# Patient Record
Sex: Female | Born: 2000 | Hispanic: Yes | Marital: Single | Smoking: Former smoker
Health system: Southern US, Community
[De-identification: ages and names within clinical notes are randomized; demographics above are authoritative.]

## PROBLEM LIST (undated history)

## (undated) ENCOUNTER — Emergency Department (HOSPITAL_COMMUNITY): Discharge status: Absent without leave | Payer: Medicaid Other

## (undated) ENCOUNTER — Inpatient Hospital Stay (HOSPITAL_COMMUNITY): Payer: Self-pay

## (undated) DIAGNOSIS — Z915 Personal history of self-harm: Secondary | ICD-10-CM

## (undated) DIAGNOSIS — Z9151 Personal history of suicidal behavior: Secondary | ICD-10-CM

## (undated) DIAGNOSIS — R51 Headache: Secondary | ICD-10-CM

## (undated) DIAGNOSIS — F419 Anxiety disorder, unspecified: Secondary | ICD-10-CM

## (undated) DIAGNOSIS — F329 Major depressive disorder, single episode, unspecified: Secondary | ICD-10-CM

## (undated) DIAGNOSIS — F4310 Post-traumatic stress disorder, unspecified: Secondary | ICD-10-CM

## (undated) DIAGNOSIS — J45909 Unspecified asthma, uncomplicated: Secondary | ICD-10-CM

## (undated) HISTORY — PX: NO PAST SURGERIES: SHX2092

## (undated) HISTORY — PX: OTHER SURGICAL HISTORY: SHX169

---
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
## 2018-01-07 ENCOUNTER — Other Ambulatory Visit: Payer: Self-pay

## 2018-01-07 ENCOUNTER — Encounter (HOSPITAL_COMMUNITY): Payer: Self-pay | Admitting: Emergency Medicine

## 2018-01-07 ENCOUNTER — Emergency Department (HOSPITAL_COMMUNITY)
Admission: EM | Admit: 2018-01-07 | Discharge: 2018-01-07 | Disposition: A | Discharge status: Home / Self Care | Payer: Medicaid Other | Attending: Emergency Medicine | Admitting: Emergency Medicine

## 2018-01-07 DIAGNOSIS — I10 Essential (primary) hypertension: Secondary | ICD-10-CM | POA: Diagnosis not present

## 2018-01-07 DIAGNOSIS — F17210 Nicotine dependence, cigarettes, uncomplicated: Secondary | ICD-10-CM | POA: Insufficient documentation

## 2018-01-07 DIAGNOSIS — Z79899 Other long term (current) drug therapy: Secondary | ICD-10-CM | POA: Diagnosis not present

## 2018-01-07 DIAGNOSIS — M545 Low back pain: Secondary | ICD-10-CM | POA: Diagnosis present

## 2018-01-07 DIAGNOSIS — N3000 Acute cystitis without hematuria: Secondary | ICD-10-CM | POA: Insufficient documentation

## 2018-01-07 LAB — URINALYSIS, ROUTINE W REFLEX MICROSCOPIC
Bacteria, UA: NONE SEEN
Bilirubin Urine: NEGATIVE
Glucose, UA: NEGATIVE mg/dL
Ketones, ur: 5 mg/dL — AB
Nitrite: NEGATIVE
PH: 6 (ref 5.0–8.0)
Protein, ur: NEGATIVE mg/dL
SPECIFIC GRAVITY, URINE: 1.027 (ref 1.005–1.030)

## 2018-01-07 LAB — PREGNANCY, URINE: Preg Test, Ur: NEGATIVE

## 2018-01-07 MED ORDER — CEPHALEXIN 500 MG PO CAPS: 500.0000 mg | ORAL_CAPSULE | Freq: Two times a day (BID) | ORAL | 0 refills | Status: AC

## 2018-01-07 MED ORDER — CEPHALEXIN 500 MG PO CAPS
500.0000 mg | ORAL_CAPSULE | Freq: Once | ORAL | Status: AC
  Administered 2018-01-07: 500 mg via ORAL
  Filled 2018-01-07: qty 1

## 2018-01-07 NOTE — ED Triage Notes (Signed)
Pt c/o lower back / flank pain. Pt states pain has worsened over the past several days.  Pt denies injury or strenuous work. States it feels like when she had a UTI.

## 2018-01-07 NOTE — ED Provider Notes (Signed)
[facility]-EMERGENCY DEPT Provider Note   CSN: [id number] Arrival date & time: 01/07/18  1921     History   Chief Complaint Chief Complaint  Patient presents with  . Flank Pain    HPI Erika Christell ConstantMoore is a 17 y.o. female.  HPI Pt has had some lower back aching for the last few weeks off and on.  Certain movements and positions increase the pain.  Curling into a ball helps. The sx have not resolved so she decided to come in and get checked.  Pt has a history of uti before that developed into a kidney infection.  She wants to make sure that is not going on.  No fevers.  No nausea or vomiting.  No abdominal pain.  Pt has HTN.  She has been taking her meds.   Past Medical History:  Diagnosis Date  . Anxiety   . Headache(784.0)   . Hx of suicide attempt   . Major depressive disorder   . PTSD (post-traumatic stress disorder)     Patient Active Problem List   Diagnosis Date Noted  . MDD (major depressive disorder), recurrent episode, severe (HCC) 10/10/2017  . Bipolar disorder (HCC) 08/23/2017  . Substance use disorder 06/08/2017  . Elevated blood pressure reading 04/13/2017  . Encounter for initial prescription of injectable contraceptive 03/09/2017  . 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 Hx of suicide attempt 03/19/2016  . PTSD (post-traumatic stress disorder) 03/19/2016  . Generalized anxiety disorder   . 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 Suicidal ideation     History reviewed. No pertinent surgical history.   OB History    Gravida  1   Para  1   Term  1   Preterm  0   AB  0   Living  1     SAB  0   TAB  0   Ectopic  0   Multiple  0   Live Births  1            Home Medications    Prior to Admission medications   Medication Sig Start Date End Date Taking? Authorizing Provider  albuterol (PROVENTIL HFA;VENTOLIN HFA) 108 (90 Base) MCG/ACT inhaler Inhale 1 puff  into the lungs every 6 (six) hours as needed for wheezing or shortness of breath. 10/13/17  Yes Armandina StammerNwoko, Agnes I, NP  escitalopram (LEXAPRO) 10 MG tablet Take 1 tablet (10 mg total) by mouth daily. For depression 10/14/17  Yes Nwoko, Nicole KindredAgnes I, NP  ibuprofen (ADVIL,MOTRIN) 200 MG tablet Take 800 mg by mouth every 6 (six) hours as needed for moderate pain.   Yes [provider]  medroxyPROGESTERone (DEPO-PROVERA) 150 MG/ML injection Inject 150 mg into the muscle every 3 (three) months.   Yes [provider]  prazosin (MINIPRESS) 1 MG capsule Take 1 capsule (1 mg total) by mouth at bedtime. For PTSD symptoms 10/13/17  Yes Armandina StammerNwoko, Agnes I, NP  traZODone (DESYREL) 50 MG tablet Take 1 tablet (50 mg total) by mouth at bedtime as needed for sleep. 10/13/17  Yes Armandina StammerNwoko, Agnes I, NP  cephALEXin (KEFLEX) 500 MG capsule Take 1 capsule (500 mg total) by mouth 2 (two) times daily for 5 days. 01/07/18 01/12/18  Linwood DibblesKnapp, Gwendoline Judy, MD  fluconazole (DIFLUCAN) 150 MG tablet Take 1 tablet today and 1 tablet 3 days from now Patient not taking: Reported on 01/07/2018 11/02/17   Verneda SkillHacker, Caroline T, FNP  zolpidem (AMBIEN) 5 MG  tablet Take 5 mg by mouth at bedtime as needed for sleep.  10/19/17   [provider]    Family History Family History  Problem Relation Age of Onset  . Diabetes Maternal Aunt   . Diabetes Maternal Grandmother   . Cancer Maternal Grandmother   . Breast cancer Other     Social History Social History   Tobacco Use  . Smoking status: Current Every Day Smoker    Packs/day: 0.25    Types: Cigars  . Smokeless tobacco: Never Used  . Tobacco comment: black and mild  Substance Use Topics  . Alcohol use: No  . Drug use: Yes    Types: Marijuana, MDMA (Ecstacy)     Allergies   Lactose intolerance (gi) and Tape   Review of Systems Review of Systems  All other systems reviewed and are negative.    Physical Exam Updated Vital Signs BP (!) 174/100 (BP Location: Left Arm)    Pulse (!) 104   Temp 98.7 F (37.1 C) (Oral)   Resp 18   LMP 12/15/2017 (Exact Date)   SpO2 100%   Physical Exam Vitals signs and nursing note reviewed.  Constitutional:      General: She is not in acute distress.    Appearance: She is well-developed.  HENT:     Head: Normocephalic and atraumatic.     Right Ear: External ear normal.     Left Ear: External ear normal.  Eyes:     General: No scleral icterus.       Right eye: No discharge.        Left eye: No discharge.     Conjunctiva/sclera: Conjunctivae normal.  Neck:     Musculoskeletal: Neck supple.     Trachea: No tracheal deviation.  Cardiovascular:     Rate and Rhythm: Normal rate and regular rhythm.  Pulmonary:     Effort: Pulmonary effort is normal. No respiratory distress.     Breath sounds: Normal breath sounds. No stridor. No wheezing or rales.  Abdominal:     General: Bowel sounds are normal. There is no distension.     Palpations: Abdomen is soft.     Tenderness: There is no abdominal tenderness. There is no guarding or rebound.  Musculoskeletal:        General: No tenderness.  Skin:    General: Skin is warm and dry.     Findings: No rash.  Neurological:     Mental Status: She is alert.     Cranial Nerves: No cranial nerve deficit (no facial droop, extraocular movements intact, no slurred speech).     Sensory: No sensory deficit.     Motor: No abnormal muscle tone or seizure activity.     Coordination: Coordination normal.      ED Treatments / Results  Labs (all labs ordered are listed, but only abnormal results are displayed) Labs Reviewed  URINALYSIS, ROUTINE W REFLEX MICROSCOPIC - Abnormal; Notable for the following components:      Result Value   APPearance HAZY (*)    Hgb urine dipstick MODERATE (*)    Ketones, ur 5 (*)    Leukocytes, UA MODERATE (*)    All other components within normal limits  PREGNANCY, URINE     Procedures Procedures (including critical care time)  Medications  Ordered in ED Medications  cephALEXin (KEFLEX) capsule 500 mg (has no administration in time range)     Initial Impression / Assessment and Plan / ED Course  I  have reviewed the triage vital signs and the nursing notes.  Pertinent labs & imaging results that were available during my care of the patient were reviewed by me and considered in my medical decision making (see chart for details).   UA is consistent with uti.  Patient does not have any findings to suggest pyelonephritis.  Pt is also having asymptomatic hypertension.  I recommended she follow-up with her primary care doctor.  Final Clinical Impressions(s) / ED Diagnoses   Final diagnoses:  Acute cystitis without hematuria  Hypertension, unspecified type    ED Discharge Orders         Ordered    cephALEXin (KEFLEX) 500 MG capsule  2 times daily     01/07/18 2104           Linwood Dibbles, MD 01/07/18 2113

## 2018-01-07 NOTE — Discharge Instructions (Addendum)
Take the antibiotics as prescribed, follow-up with your primary care doctor in the next week or 2 to make sure the infection has not cleared.  double check on your blood pressure . return for fever , vomiting, or other concerning symptoms

## 2018-01-30 ENCOUNTER — Ambulatory Visit (INDEPENDENT_AMBULATORY_CARE_PROVIDER_SITE_OTHER): Payer: Medicaid Other | Admitting: Pediatrics

## 2018-01-30 ENCOUNTER — Encounter: Payer: Self-pay | Admitting: Pediatrics

## 2018-01-30 VITALS — BP 124/79 | HR 81 | Ht 68.0 in | Wt 247.0 lb

## 2018-01-30 DIAGNOSIS — Z113 Encounter for screening for infections with a predominantly sexual mode of transmission: Secondary | ICD-10-CM

## 2018-01-30 DIAGNOSIS — Z3202 Encounter for pregnancy test, result negative: Secondary | ICD-10-CM

## 2018-01-30 DIAGNOSIS — Z30016 Encounter for initial prescription of transdermal patch hormonal contraceptive device: Secondary | ICD-10-CM

## 2018-01-30 DIAGNOSIS — F411 Generalized anxiety disorder: Secondary | ICD-10-CM | POA: Diagnosis not present

## 2018-01-30 DIAGNOSIS — F333 Major depressive disorder, recurrent, severe with psychotic symptoms: Secondary | ICD-10-CM

## 2018-01-30 DIAGNOSIS — B009 Herpesviral infection, unspecified: Secondary | ICD-10-CM

## 2018-01-30 DIAGNOSIS — F1990 Other psychoactive substance use, unspecified, uncomplicated: Secondary | ICD-10-CM

## 2018-01-30 DIAGNOSIS — F3175 Bipolar disorder, in partial remission, most recent episode depressed: Secondary | ICD-10-CM

## 2018-01-30 LAB — POCT URINE PREGNANCY: Preg Test, Ur: NEGATIVE

## 2018-01-30 MED ORDER — NORELGESTROMIN-ETH ESTRADIOL 150-35 MCG/24HR TD PTWK: 1.0000 | MEDICATED_PATCH | TRANSDERMAL | 12 refills | Status: DC

## 2018-01-30 MED ORDER — VALACYCLOVIR HCL 500 MG PO TABS: 500.0000 mg | ORAL_TABLET | Freq: Two times a day (BID) | ORAL | 3 refills | Status: DC

## 2018-01-30 NOTE — Patient Instructions (Addendum)
Use patch according to printed instructions  Continue to monitor your memory related to your mood and stress level. Continue with psychiatry.  Use valtrex as needed for outbreaks.  We will see you in 6 weeks

## 2018-01-30 NOTE — Progress Notes (Signed)
History was provided by the patient.  [name] is a 18 y.o. female who is here for contraception, MDD, anxiety, STI screening.  [name], FNP   HPI:  Pt reports that she went to the ER with a kidney infection recently and wants to make sure it is gone. She is also concerned that she has BV and yeast again today too. Also requesting an STI check today.   Having memory and concentration issues. She is forgetting words that she knows, routines, times to go to work, Catering manager. She reports it has been about 6-8 weeks that it has been happening. She has been off her medications since September. Her medicaid got discontinued in October but is reinstated.    Has been going to therapy regularly. Had a med appointment last week and will go again next week to restart others. She has not spoken to psychiatrist about concerns.   She is smoking cigarettes and marijuana but trying to quit cigarettes. Denies other substances.   Bled for 5 weeks which started after thanksgiving and just stopped a few days ago. It is still not totally gone. She reports she hasn't really been having sex bc she hasn't really been "feeling anything" or been feeling sad. Interested in patch. Denies migraine with aura. Denies hx of blood clots.   Reports her psychiatrist wants [place] to draw some labs for her but she can't remember which ones- advised to call back when she knows and will place orders and schedule appt time with lab.   Patient's last menstrual period was 01/25/2018.  Review of Systems  Constitutional: Negative for malaise/fatigue.  Eyes: Negative for double vision.  Respiratory: Negative for shortness of breath.   Cardiovascular: Negative for chest pain and palpitations.  Gastrointestinal: Negative for abdominal pain, constipation, diarrhea, nausea and vomiting.  Genitourinary: Negative for dysuria.  Musculoskeletal: Negative for joint pain and myalgias.  Skin: Negative for rash.  Neurological: Positive for  headaches. Negative for dizziness.  Endo/Heme/Allergies: Does not bruise/bleed easily.  Psychiatric/Behavioral: Positive for depression. The patient is nervous/anxious.     Patient Active Problem List   Diagnosis Date Noted  . MDD (major depressive disorder), recurrent episode, severe (HCC) 10/10/2017  . Bipolar disorder (HCC) 08/23/2017  . Substance use disorder 06/08/2017  . Elevated blood pressure reading 04/13/2017  . 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 Hx of suicide attempt 03/19/2016  . PTSD (post-traumatic stress disorder) 03/19/2016  . Generalized anxiety disorder   . 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 Suicidal ideation     Current Outpatient Medications on File Prior to Visit  Medication Sig Dispense Refill  . albuterol (PROVENTIL HFA;VENTOLIN HFA) 108 (90 Base) MCG/ACT inhaler Inhale 1 puff into the lungs every 6 (six) hours as needed for wheezing or shortness of breath.    . cariprazine (VRAYLAR) capsule Take 1.5 mg by mouth.    Marland Kitchen ibuprofen (ADVIL,MOTRIN) 200 MG tablet Take 800 mg by mouth every 6 (six) hours as needed for moderate pain.    . medroxyPROGESTERone (DEPO-PROVERA) 150 MG/ML injection Inject 150 mg into the muscle every 3 (three) months.    . zolpidem (AMBIEN) 5 MG tablet Take 5 mg by mouth at bedtime as needed for sleep.   0   No current facility-administered medications on file prior to visit.     Allergies  Allergen Reactions  . Lactose Intolerance (Gi) Nausea And Vomiting and Other (See Comments)  . Tape Other (See Comments)  Slight skin irritation     Physical Exam:    Vitals:   01/30/18 1024  BP: 124/79  Pulse: 81  Weight: 247 lb (112 kg)  Height: 5\' 8"  (1.727 m)    Blood pressure percentiles are not available for patients who are 18 years or older.  Physical Exam Vitals signs and nursing note reviewed.  Constitutional:      General: She is not in acute distress.    Appearance: She is  well-developed.  Neck:     Thyroid: No thyromegaly.  Cardiovascular:     Rate and Rhythm: Normal rate and regular rhythm.     Heart sounds: No murmur.  Pulmonary:     Breath sounds: Normal breath sounds.  Abdominal:     Palpations: Abdomen is soft. There is no mass.     Tenderness: There is no abdominal tenderness. There is no guarding.  Lymphadenopathy:     Cervical: No cervical adenopathy.  Skin:    General: Skin is warm.     Findings: No rash.  Neurological:     General: No focal deficit present.     Mental Status: She is alert and oriented to person, place, and time.  Psychiatric:        Mood and Affect: Mood normal.     Assessment/Plan: 1. MDD (major depressive disorder), recurrent, severe, with psychosis (HCC) I suspect that her mental health conditions and new stress in her household are causing the issues with her concentration and remembering things. Asked her to discuss with her psychiatrist this week, and if still persistent and bothersome when we see her in 6 weeks or new focal deficits, will send her to neurology for further eval.   2. Generalized anxiety disorder As above. Managed per psych.   3. Substance use disorder Currently only using MJ and nicotine- trying to cut down.   4. Bipolar disorder, in partial remission, most recent episode depressed (HCC) As above.   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 Discussed transmission prevention and risk to fetus on delivery if not well managed at the time.  - valACYclovir (VALTREX) 500 MG tablet; Take 1 tablet (500 mg total) by mouth 2 (two) times daily.  Dispense: 6 tablet; Refill: 3  6. Encounter for initial prescription of transdermal patch hormonal contraceptive device Will try patch instead of depo- did not like mirena or skyla.  - norelgestromin-ethinyl estradiol (ORTHO EVRA) 150-35 MCG/24HR transdermal patch; Place 1 patch onto the skin once a week.  Dispense: 3 patch; Refill:  12  7. Routine screening for STI (sexually transmitted infection) Will get screening today to eval for discharge as well as any infections.  - HIV antibody (with reflex) - RPR - C. trachomatis/N. gonorrhoeae RNA - WET PREP BY MOLECULAR PROBE  8. Pregnancy examination or test, negative result Negative- ok to start patch today.  - POCT urine pregnancy

## 2018-01-31 LAB — WET PREP BY MOLECULAR PROBE
CANDIDA SPECIES: NOT DETECTED
Gardnerella vaginalis: NOT DETECTED
MICRO NUMBER:: 15803
SPECIMEN QUALITY:: ADEQUATE
Trichomonas vaginosis: NOT DETECTED

## 2018-01-31 LAB — C. TRACHOMATIS/N. GONORRHOEAE RNA
C. trachomatis RNA, TMA: DETECTED — AB
N. gonorrhoeae RNA, TMA: NOT DETECTED

## 2018-01-31 LAB — RPR: RPR Ser Ql: NONREACTIVE

## 2018-01-31 LAB — HIV ANTIBODY (ROUTINE TESTING W REFLEX): HIV 1&2 Ab, 4th Generation: NONREACTIVE

## 2018-02-01 ENCOUNTER — Other Ambulatory Visit: Payer: Self-pay | Admitting: Pediatrics

## 2018-02-01 MED ORDER — AZITHROMYCIN 500 MG PO TABS: 1000.0000 mg | ORAL_TABLET | Freq: Once | ORAL | 0 refills | Status: AC

## 2018-02-02 ENCOUNTER — Ambulatory Visit: Payer: Medicaid Other | Admitting: Certified Nurse Midwife

## 2018-02-04 ENCOUNTER — Other Ambulatory Visit: Payer: Self-pay

## 2018-02-04 ENCOUNTER — Encounter (HOSPITAL_COMMUNITY): Payer: Self-pay | Admitting: *Deleted

## 2018-02-04 ENCOUNTER — Ambulatory Visit (HOSPITAL_COMMUNITY)
Admission: EM | Admit: 2018-02-04 | Discharge: 2018-02-04 | Disposition: A | Discharge status: Home / Self Care | Payer: Medicaid Other | Attending: Family Medicine | Admitting: Family Medicine

## 2018-02-04 DIAGNOSIS — R109 Unspecified abdominal pain: Secondary | ICD-10-CM | POA: Diagnosis not present

## 2018-02-04 DIAGNOSIS — R112 Nausea with vomiting, unspecified: Secondary | ICD-10-CM | POA: Diagnosis not present

## 2018-02-04 DIAGNOSIS — R197 Diarrhea, unspecified: Secondary | ICD-10-CM | POA: Diagnosis not present

## 2018-02-04 DIAGNOSIS — Z3202 Encounter for pregnancy test, result negative: Secondary | ICD-10-CM | POA: Diagnosis not present

## 2018-02-04 LAB — POCT URINALYSIS DIP (DEVICE)
BILIRUBIN URINE: NEGATIVE
Glucose, UA: NEGATIVE mg/dL
KETONES UR: NEGATIVE mg/dL
Leukocytes, UA: NEGATIVE
Nitrite: NEGATIVE
Protein, ur: NEGATIVE mg/dL
Specific Gravity, Urine: 1.025 (ref 1.005–1.030)
Urobilinogen, UA: 0.2 mg/dL (ref 0.0–1.0)
pH: 6.5 (ref 5.0–8.0)

## 2018-02-04 LAB — POCT I-STAT, CHEM 8
BUN: 5 mg/dL — ABNORMAL LOW (ref 6–20)
CALCIUM ION: 1.19 mmol/L (ref 1.15–1.40)
CHLORIDE: 106 mmol/L (ref 98–111)
Creatinine, Ser: 0.7 mg/dL (ref 0.44–1.00)
Glucose, Bld: 105 mg/dL — ABNORMAL HIGH (ref 70–99)
HCT: 37 % (ref 36.0–46.0)
Hemoglobin: 12.6 g/dL (ref 12.0–15.0)
Potassium: 3.9 mmol/L (ref 3.5–5.1)
Sodium: 140 mmol/L (ref 135–145)
TCO2: 24 mmol/L (ref 22–32)

## 2018-02-04 LAB — POCT PREGNANCY, URINE: Preg Test, Ur: NEGATIVE

## 2018-02-04 MED ORDER — LOPERAMIDE HCL 2 MG PO CAPS: 2.0000 mg | ORAL_CAPSULE | Freq: Four times a day (QID) | ORAL | 0 refills | Status: DC | PRN

## 2018-02-04 MED ORDER — ONDANSETRON 4 MG PO TBDP
ORAL_TABLET | ORAL | Status: AC
  Filled 2018-02-04: qty 1

## 2018-02-04 MED ORDER — ONDANSETRON HCL 4 MG PO TABS: 4.0000 mg | ORAL_TABLET | Freq: Four times a day (QID) | ORAL | 0 refills | Status: DC

## 2018-02-04 MED ORDER — ONDANSETRON 4 MG PO TBDP
4.0000 mg | ORAL_TABLET | Freq: Once | ORAL | Status: AC
  Administered 2018-02-04: 4 mg via ORAL

## 2018-02-04 NOTE — ED Provider Notes (Signed)
[facility]    CSN: [id number] Arrival date & time: 02/04/18  1004     History   Chief Complaint Chief Complaint  Patient presents with  . Abdominal Pain    HPI [name] is a 18 y.o. female.   HPI  Patient is using Depo-Medrol for birth control.  She states she had unprotected sex 01/22/2019.  She was seen by her primary care doctor 6 days later and had a physical examination and pelvic.  She was found to have chlamydia.  Was treated with azithromycin.  She had irregular bleeding on her Depo-Medrol.  She states "I feel like I am pregnant". Today she is here for nausea vomiting and diarrhea.  This started yesterday.  She is had watery diarrhea.  Sometimes she feels like she has to go in there is very little stool.  Her last episode of vomiting was this morning.  Last diarrhea was last night.  She needs a note for work.  No blood in stool or emesis.  She states "everyone in the house is sick".  Past Medical History:  Diagnosis Date  . Anxiety   . Headache(784.0)   . Hx of suicide attempt   . Major depressive disorder   . PTSD (post-traumatic stress disorder)     Patient Active Problem List   Diagnosis Date Noted  . Encounter for initial prescription of transdermal patch hormonal contraceptive device 01/30/2018  . MDD (major depressive disorder), recurrent episode, severe (HCC) 10/10/2017  . Bipolar disorder (HCC) 08/23/2017  . Substance use disorder 06/08/2017  . Elevated blood pressure reading 04/13/2017  . 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 Hx of suicide attempt 03/19/2016  . PTSD (post-traumatic stress disorder) 03/19/2016  . Generalized anxiety disorder   . 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 Suicidal ideation     History reviewed. No pertinent surgical history.  OB History    Gravida  1   Para  1   Term  1   Preterm  0   AB  0   Living  1     SAB  0   TAB  0   Ectopic  0   Multiple  0     Live Births  1            Home Medications    Prior to Admission medications   Medication Sig Start Date End Date Taking? Authorizing Provider  albuterol (PROVENTIL HFA;VENTOLIN HFA) 108 (90 Base) MCG/ACT inhaler Inhale 1 puff into the lungs every 6 (six) hours as needed for wheezing or shortness of breath. 10/13/17   Armandina StammerNwoko, Agnes I, NP  cariprazine (VRAYLAR) capsule Take 1.5 mg by mouth.    [provider]  ibuprofen (ADVIL,MOTRIN) 200 MG tablet Take 800 mg by mouth every 6 (six) hours as needed for moderate pain.    [provider]  loperamide (IMODIUM) 2 MG capsule Take 1 capsule (2 mg total) by mouth 4 (four) times daily as needed for diarrhea or loose stools. 02/04/18   Eustace MooreNelson, Nysha Koplin Sue, MD  norelgestromin-ethinyl estradiol (ORTHO EVRA) 150-35 MCG/24HR transdermal patch Place 1 patch onto the skin once a week. 01/30/18   Verneda SkillHacker, Caroline T, FNP  ondansetron (ZOFRAN) 4 MG tablet Take 1 tablet (4 mg total) by mouth every 6 (six) hours. 02/04/18   Eustace MooreNelson, Johnatan Baskette Sue, MD  valACYclovir (VALTREX) 500 MG tablet Take 1 tablet (500 mg total) by mouth 2 (two) times daily. 01/30/18   Maxwell CaulHacker,  Jan Firemanaroline T, FNP  zolpidem (AMBIEN) 5 MG tablet Take 5 mg by mouth at bedtime as needed for sleep.  10/19/17   [provider]    Family History Family History  Problem Relation Age of Onset  . Diabetes Maternal Aunt   . Diabetes Maternal Grandmother   . Cancer Maternal Grandmother   . Breast cancer Other     Social History Social History   Tobacco Use  . Smoking status: Current Every Day Smoker    Packs/day: 0.25    Types: Cigars  . Smokeless tobacco: Never Used  . Tobacco comment: black and mild  Substance Use Topics  . Alcohol use: Yes  . Drug use: Yes    Types: Marijuana, MDMA (Ecstacy)     Allergies   Lactose intolerance (gi) and Tape   Review of Systems Review of Systems  Constitutional: Positive for fatigue. Negative for chills and fever.  HENT:  Positive for congestion. Negative for ear pain and sore throat.   Eyes: Negative for pain and visual disturbance.  Respiratory: Negative for cough and shortness of breath.   Cardiovascular: Negative for chest pain and palpitations.  Gastrointestinal: Positive for abdominal pain, diarrhea, nausea and vomiting.  Genitourinary: Negative for dysuria and hematuria.  Musculoskeletal: Negative for arthralgias and back pain.  Skin: Negative for color change and rash.  Neurological: Negative for seizures and syncope.  All other systems reviewed and are negative.    Physical Exam Triage Vital Signs ED Triage Vitals  Enc Vitals Group     BP 02/04/18 1021 136/74     Pulse Rate 02/04/18 1021 96     Resp --      Temp 02/04/18 1021 99.3 F (37.4 C)     Temp Source 02/04/18 1021 Oral     SpO2 02/04/18 1021 99 %     Weight --      Height 02/04/18 1023 5\' 9"  (1.753 m)     Head Circumference --      Peak Flow --      Pain Score 02/04/18 1023 7     Pain Loc --      Pain Edu? --      Excl. in GC? --    No data found.  Updated Vital Signs BP 136/74 (BP Location: Left Arm)   Pulse 96   Temp 99.3 F (37.4 C) (Oral)   Ht 5\' 9"  (1.753 m)   LMP 01/25/2018 (Exact Date)   SpO2 99%   BMI 36.48 kg/m      Physical Exam Constitutional:      General: She is not in acute distress.    Appearance: She is well-developed. She is obese.     Comments: Tearful  HENT:     Head: Normocephalic and atraumatic.     Mouth/Throat:     Mouth: Mucous membranes are moist.  Eyes:     Extraocular Movements: Extraocular movements intact.     Conjunctiva/sclera: Conjunctivae normal.     Pupils: Pupils are equal, round, and reactive to light.  Neck:     Musculoskeletal: Normal range of motion.  Cardiovascular:     Rate and Rhythm: Normal rate and regular rhythm.     Heart sounds: Normal heart sounds.  Pulmonary:     Effort: Pulmonary effort is normal. No respiratory distress.     Breath sounds: Normal  breath sounds.  Abdominal:     General: Bowel sounds are normal. There is no distension.     Palpations: Abdomen  is soft.     Tenderness: There is no right CVA tenderness, left CVA tenderness, guarding or rebound.     Comments: Obese abdomen.  Bowel sounds are active.  No tenderness to deep palpation in all 4 quadrants.  Musculoskeletal: Normal range of motion.  Skin:    General: Skin is warm and dry.  Neurological:     General: No focal deficit present.     Mental Status: She is alert.  Psychiatric:        Mood and Affect: Mood is anxious.      UC Treatments / Results  Labs (all labs ordered are listed, but only abnormal results are displayed) Labs Reviewed  POCT URINALYSIS DIP (DEVICE) - Abnormal; Notable for the following components:      Result Value   Hgb urine dipstick TRACE (*)    All other components within normal limits  POCT I-STAT, CHEM 8 - Abnormal; Notable for the following components:   BUN 5 (*)    Glucose, Bld 105 (*)    All other components within normal limits  I-STAT CHEM 8, ED  POC URINE PREG, ED  POCT PREGNANCY, URINE    EKG None  Radiology No results found.  Procedures Procedures (including critical care time)  Medications Ordered in UC Medications  ondansetron (ZOFRAN-ODT) disintegrating tablet 4 mg (4 mg Oral Given 02/04/18 1057)    Initial Impression / Assessment and Plan / UC Course  I have reviewed the triage vital signs and the nursing notes.  Pertinent labs & imaging results that were available during my care of the patient were reviewed by me and considered in my medical decision making (see chart for details).    I explained patient that she is not pregnant.  The waves abdominal crampy pain from the gastroenteritis.We discussed medication, fluids, rest, gradually adding diet.  She is given 2 days of work.  Final Clinical Impressions(s) / UC Diagnoses   Final diagnoses:  Nausea vomiting and diarrhea     Discharge  Instructions     You have a stomach flu Take zofran as needed for nausea and vomiting Imodium will help with cramps and diarrhea Drink lots of fluids This should resolve in a couple of days Call your PCP if not improving by Monday Pregnancy test is NEGATIVE     ED Prescriptions    Medication Sig Dispense Auth. Provider   ondansetron (ZOFRAN) 4 MG tablet Take 1 tablet (4 mg total) by mouth every 6 (six) hours. 12 tablet Eustace Sweetman, MD   loperamide (IMODIUM) 2 MG capsule Take 1 capsule (2 mg total) by mouth 4 (four) times daily as needed for diarrhea or loose stools. 12 capsule Eustace Snedden, MD     Controlled Substance Prescriptions Person Controlled Substance Registry consulted? Not Applicable   Eustace Cartelli, MD 02/04/18 1143

## 2018-02-04 NOTE — Discharge Instructions (Signed)
You have a stomach flu Take zofran as needed for nausea and vomiting Imodium will help with cramps and diarrhea Drink lots of fluids This should resolve in a couple of days Call your PCP if not improving by Monday Pregnancy test is NEGATIVE

## 2018-02-04 NOTE — ED Triage Notes (Signed)
C/o abd. Pain with n/v. States she started vomiting yest at 4p. C/o decreased appetite. , states she feels preg. On depo had unprotected sex. 12/28 was seen at PCP on 1/4 had a neg preg. Test

## 2018-02-16 ENCOUNTER — Encounter (HOSPITAL_COMMUNITY): Payer: Self-pay

## 2018-02-16 ENCOUNTER — Emergency Department (HOSPITAL_COMMUNITY): Payer: Medicaid Other

## 2018-02-16 ENCOUNTER — Emergency Department (HOSPITAL_COMMUNITY)
Admission: EM | Admit: 2018-02-16 | Discharge: 2018-02-16 | Disposition: A | Discharge status: Home / Self Care | Payer: Medicaid Other | Attending: Emergency Medicine | Admitting: Emergency Medicine

## 2018-02-16 ENCOUNTER — Other Ambulatory Visit: Payer: Self-pay

## 2018-02-16 DIAGNOSIS — R102 Pelvic and perineal pain: Secondary | ICD-10-CM | POA: Insufficient documentation

## 2018-02-16 DIAGNOSIS — R110 Nausea: Secondary | ICD-10-CM | POA: Insufficient documentation

## 2018-02-16 DIAGNOSIS — N898 Other specified noninflammatory disorders of vagina: Secondary | ICD-10-CM | POA: Insufficient documentation

## 2018-02-16 DIAGNOSIS — Z202 Contact with and (suspected) exposure to infections with a predominantly sexual mode of transmission: Secondary | ICD-10-CM | POA: Insufficient documentation

## 2018-02-16 DIAGNOSIS — R079 Chest pain, unspecified: Secondary | ICD-10-CM | POA: Insufficient documentation

## 2018-02-16 DIAGNOSIS — Z79899 Other long term (current) drug therapy: Secondary | ICD-10-CM | POA: Insufficient documentation

## 2018-02-16 DIAGNOSIS — F17290 Nicotine dependence, other tobacco product, uncomplicated: Secondary | ICD-10-CM | POA: Insufficient documentation

## 2018-02-16 LAB — CBC WITH DIFFERENTIAL/PLATELET
Abs Immature Granulocytes: 0.01 10*3/uL (ref 0.00–0.07)
Basophils Absolute: 0 10*3/uL (ref 0.0–0.1)
Basophils Relative: 0 %
Eosinophils Absolute: 0 10*3/uL (ref 0.0–0.5)
Eosinophils Relative: 1 %
HCT: 40.2 % (ref 36.0–46.0)
Hemoglobin: 12.9 g/dL (ref 12.0–15.0)
Immature Granulocytes: 0 %
Lymphocytes Relative: 23 %
Lymphs Abs: 1.5 10*3/uL (ref 0.7–4.0)
MCH: 29.8 pg (ref 26.0–34.0)
MCHC: 32.1 g/dL (ref 30.0–36.0)
MCV: 92.8 fL (ref 80.0–100.0)
Monocytes Absolute: 0.5 10*3/uL (ref 0.1–1.0)
Monocytes Relative: 7 %
Neutro Abs: 4.3 10*3/uL (ref 1.7–7.7)
Neutrophils Relative %: 69 %
Platelets: 216 10*3/uL (ref 150–400)
RBC: 4.33 MIL/uL (ref 3.87–5.11)
RDW: 13.2 % (ref 11.5–15.5)
WBC: 6.3 10*3/uL (ref 4.0–10.5)
nRBC: 0 % (ref 0.0–0.2)

## 2018-02-16 LAB — WET PREP, GENITAL
Clue Cells Wet Prep HPF POC: NONE SEEN
Sperm: NONE SEEN
Trich, Wet Prep: NONE SEEN
WBC, Wet Prep HPF POC: NONE SEEN
Yeast Wet Prep HPF POC: NONE SEEN

## 2018-02-16 LAB — COMPREHENSIVE METABOLIC PANEL
ALT: 60 U/L — ABNORMAL HIGH (ref 0–44)
AST: 26 U/L (ref 15–41)
Albumin: 4 g/dL (ref 3.5–5.0)
Alkaline Phosphatase: 53 U/L (ref 38–126)
Anion gap: 7 (ref 5–15)
BUN: 12 mg/dL (ref 6–20)
CO2: 24 mmol/L (ref 22–32)
Calcium: 9.1 mg/dL (ref 8.9–10.3)
Chloride: 109 mmol/L (ref 98–111)
Creatinine, Ser: 0.77 mg/dL (ref 0.44–1.00)
GFR calc Af Amer: >60 mL/min (ref >60)
GFR calc non Af Amer: >60 mL/min (ref >60)
Glucose, Bld: 93 mg/dL (ref 70–99)
Potassium: 4.1 mmol/L (ref 3.5–5.1)
Sodium: 140 mmol/L (ref 135–145)
Total Bilirubin: 0.4 mg/dL (ref 0.3–1.2)
Total Protein: 7.1 g/dL (ref 6.5–8.1)

## 2018-02-16 LAB — URINALYSIS, ROUTINE W REFLEX MICROSCOPIC
Bilirubin Urine: NEGATIVE
Glucose, UA: NEGATIVE mg/dL
Hgb urine dipstick: NEGATIVE
Ketones, ur: NEGATIVE mg/dL
Leukocytes, UA: NEGATIVE
Nitrite: NEGATIVE
Protein, ur: NEGATIVE mg/dL
Specific Gravity, Urine: 1.015 (ref 1.005–1.030)
pH: 6 (ref 5.0–8.0)

## 2018-02-16 LAB — LIPASE, BLOOD: Lipase: 33 U/L (ref 11–51)

## 2018-02-16 LAB — PREGNANCY, URINE: Preg Test, Ur: NEGATIVE

## 2018-02-16 MED ORDER — KETOROLAC TROMETHAMINE 30 MG/ML IJ SOLN: 30.0000 mg | Freq: Once | INTRAMUSCULAR | Status: DC

## 2018-02-16 MED ORDER — ONDANSETRON 4 MG PO TBDP
4.0000 mg | ORAL_TABLET | Freq: Once | ORAL | Status: AC
  Administered 2018-02-16: 4 mg via ORAL
  Filled 2018-02-16: qty 1

## 2018-02-16 MED ORDER — KETOROLAC TROMETHAMINE 30 MG/ML IJ SOLN
30.0000 mg | Freq: Once | INTRAMUSCULAR | Status: AC
  Administered 2018-02-16: 30 mg via INTRAMUSCULAR
  Filled 2018-02-16: qty 1

## 2018-02-16 MED ORDER — DOXYCYCLINE HYCLATE 100 MG PO CAPS: 100.0000 mg | ORAL_CAPSULE | Freq: Two times a day (BID) | ORAL | 0 refills | Status: DC

## 2018-02-16 MED ORDER — ONDANSETRON HCL 4 MG PO TABS: 4.0000 mg | ORAL_TABLET | Freq: Four times a day (QID) | ORAL | 0 refills | Status: DC

## 2018-02-16 MED ORDER — NAPROXEN 500 MG PO TABS: 500.0000 mg | ORAL_TABLET | Freq: Two times a day (BID) | ORAL | 0 refills | Status: DC

## 2018-02-16 MED ORDER — ONDANSETRON HCL 4 MG/2ML IJ SOLN: 4.0000 mg | Freq: Once | INTRAMUSCULAR | Status: DC

## 2018-02-16 NOTE — Discharge Instructions (Signed)
Take doxycycline until completed.  If you test positive for gonorrhea, you will need treatment for it, as you are declining the injection today.  Please follow-up with the OB/GYN or the health department for this treatment if positive for gonorrhea.  Make sure that you your sexual partners are treated for any diseases that you test positive for and abstain for 1 week after you both treated.  Please follow-up with the OB/GYN for recheck and further evaluation and treatment of your symptoms.  Please return the emergency department if develop any new or worsening symptoms including persistent, unrelenting pain in your right lower quadrant, intractable vomiting, persistent fever over 100.4, or any other concerning symptoms.

## 2018-02-16 NOTE — ED Notes (Signed)
US at bedside

## 2018-02-16 NOTE — ED Provider Notes (Signed)
[facility]EMERGENCY [facility]PT Provider Note   CSN: [id number] Arrival date & time: 02/16/18  4098     History   Chief Complaint Chief Complaint  Patient presents with  . Abdominal Pain    HPI [name] is a 18 y.o. female with history of anxiety, depression, PTSD presents with pelvic pain for the past 2 weeks.  It is intermittent.  She reports it feels like menstrual cramps, but at times feels like a "mini contraction."  She reports around Christmas, she had 5 weeks of vaginal bleeding and then a couple weeks later had a week of her normal menstrual cycle.  That has stopped.  She denies any other vaginal discharge.  She is sexually active and wears a weekly patch for birth control.  She also uses condoms.  She does still have a concern for STD exposure.  She denies any urinary symptoms.  She reports some associated nausea when the pain is bad.  She denies any vomiting or diarrhea, fevers, shortness of breath.  Patient reports she has some pain in her chest when her pain is bad, but denies any at this time or when she is not having abdominal pain.  She was evaluated at urgent care on 02/04/2018 and had a Chem-8, pregnancy test, and urinalysis; all of which were within normal limits.  HPI  Past Medical History:  Diagnosis Date  . Anxiety   . Headache(784.0)   . Hx of suicide attempt   . Major depressive disorder   . PTSD (post-traumatic stress disorder)     Patient Active Problem List   Diagnosis Date Noted  . Encounter for initial prescription of transdermal patch hormonal contraceptive device 01/30/2018  . MDD (major depressive disorder), recurrent episode, severe (HCC) 10/10/2017  . Bipolar disorder (HCC) 08/23/2017  . Substance use disorder 06/08/2017  . Elevated blood pressure reading 04/13/2017  . 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 Hx of suicide attempt 03/19/2016  . PTSD (post-traumatic stress disorder) 03/19/2016  . Generalized anxiety disorder   . MDD  (major depressive disorder), recurrent, severe, with psychosis (HCC) 12/10/2014  . Suicidal ideation     History reviewed. No pertinent surgical history.   OB History    Gravida  1   Para  1   Term  1   Preterm  0   AB  0   Living  1     SAB  0   TAB  0   Ectopic  0   Multiple  0   Live Births  1            Home Medications    Prior to Admission medications   Medication Sig Start Date End Date Taking? Authorizing Provider  albuterol (PROVENTIL HFA;VENTOLIN HFA) 108 (90 Base) MCG/ACT inhaler Inhale 1 puff into the lungs every 6 (six) hours as needed for wheezing or shortness of breath. 10/13/17  Yes Armandina Stammer I, NP  cariprazine (VRAYLAR) capsule Take 3 mg by mouth.    Yes [provider]  norelgestromin-ethinyl estradiol (ORTHO EVRA) 150-35 MCG/24HR transdermal patch Place 1 patch onto the skin once a week. Patient taking differently: Place 1 patch onto the skin once a week. Monday 01/30/18  Yes Alfonso Ramus T, FNP  prazosin (MINIPRESS) 2 MG capsule Take 2 mg by mouth at bedtime. 01/31/18  Yes [provider]  valACYclovir (VALTREX) 500 MG tablet Take 1 tablet (500 mg total) by mouth 2 (two) times daily. Patient taking differently: Take 500 mg by  mouth as needed (outbreak).  01/30/18  Yes Verneda SkillHacker, Caroline T, FNP  doxycycline (VIBRAMYCIN) 100 MG capsule Take 1 capsule (100 mg total) by mouth 2 (two) times daily for 14 days. 02/16/18 03/02/18  Emi HolesLaw, Pasha Broad M, PA-C  loperamide (IMODIUM) 2 MG capsule Take 1 capsule (2 mg total) by mouth 4 (four) times daily as needed for diarrhea or loose stools. Patient not taking: Reported on 02/16/2018 02/04/18   Eustace MooreNelson, Yvonne Sue, MD  naproxen (NAPROSYN) 500 MG tablet Take 1 tablet (500 mg total) by mouth 2 (two) times daily. 02/16/18   Kaleem Sartwell, Waylan BogaAlexandra M, PA-C  ondansetron (ZOFRAN) 4 MG tablet Take 1 tablet (4 mg total) by mouth every 6 (six) hours. 02/16/18   Meggen Spaziani, Waylan BogaAlexandra M, PA-C  zolpidem (AMBIEN) 5 MG tablet Take 5  mg by mouth at bedtime as needed for sleep.  10/19/17   [provider]    Family History Family History  Problem Relation Age of Onset  . Diabetes Maternal Aunt   . Diabetes Maternal Grandmother   . Cancer Maternal Grandmother   . Breast cancer Other     Social History Social History   Tobacco Use  . Smoking status: Current Every Day Smoker    Packs/day: 0.25    Types: Cigars  . Smokeless tobacco: Never Used  . Tobacco comment: black and mild  Substance Use Topics  . Alcohol use: Yes    Comment: socially  . Drug use: Yes    Types: Marijuana, MDMA (Ecstacy)     Allergies   Lactose intolerance (gi) and Tape   Review of Systems Review of Systems  Constitutional: Negative for chills and fever.  HENT: Negative for facial swelling and sore throat.   Respiratory: Negative for shortness of breath.   Cardiovascular: Negative for chest pain (sometimes when her abdominal pain is bad, none at present).  Gastrointestinal: Positive for abdominal pain and nausea. Negative for vomiting.  Genitourinary: Positive for pelvic pain and vaginal bleeding. Negative for dysuria and vaginal discharge.  Musculoskeletal: Positive for back pain (with the pelvic cramping).  Skin: Negative for rash and wound.  Neurological: Negative for headaches.  Psychiatric/Behavioral: The patient is not nervous/anxious.      Physical Exam Updated Vital Signs BP (!) 145/86 (BP Location: Right Arm)   Pulse 85   Temp 99.5 F (37.5 C) (Oral)   Resp 18   Ht 5\' 9"  (1.753 m)   Wt 111.6 kg   LMP 02/01/2018   SpO2 99%   BMI 36.33 kg/m   Physical Exam Vitals signs and nursing note reviewed.  Constitutional:      General: She is not in acute distress.    Appearance: She is well-developed. She is not diaphoretic.  HENT:     Head: Normocephalic and atraumatic.     Mouth/Throat:     Pharynx: No oropharyngeal exudate.  Eyes:     General: No scleral icterus.       Right eye: No discharge.         Left eye: No discharge.     Conjunctiva/sclera: Conjunctivae normal.     Pupils: Pupils are equal, round, and reactive to light.  Neck:     Musculoskeletal: Normal range of motion and neck supple.     Thyroid: No thyromegaly.  Cardiovascular:     Rate and Rhythm: Normal rate and regular rhythm.     Heart sounds: Normal heart sounds. No murmur. No friction rub. No gallop.   Pulmonary:  Effort: Pulmonary effort is normal. No respiratory distress.     Breath sounds: Normal breath sounds. No stridor. No wheezing or rales.  Abdominal:     General: Bowel sounds are normal. There is no distension.     Palpations: Abdomen is soft.     Tenderness: There is abdominal tenderness in the right lower quadrant, suprapubic area and left lower quadrant. There is no right CVA tenderness, left CVA tenderness, guarding or rebound.  Genitourinary:    Vagina: Vaginal discharge (white) present.     Cervix: Cervical motion tenderness and discharge (white) present.     Adnexa:        Right: Tenderness present.   Lymphadenopathy:     Cervical: No cervical adenopathy.  Skin:    General: Skin is warm and dry.     Coloration: Skin is not pale.     Findings: No rash.  Neurological:     Mental Status: She is alert.     Coordination: Coordination normal.      ED Treatments / Results  Labs (all labs ordered are listed, but only abnormal results are displayed) Labs Reviewed  COMPREHENSIVE METABOLIC PANEL - Abnormal; Notable for the following components:      Result Value   ALT 60 (*)    All other components within normal limits  WET PREP, GENITAL  CBC WITH DIFFERENTIAL/PLATELET  LIPASE, BLOOD  URINALYSIS, ROUTINE W REFLEX MICROSCOPIC  PREGNANCY, URINE  GC/CHLAMYDIA PROBE AMP ([place]) NOT AT [facility]    EKG None  Radiology US Transvaginal Non-ob  Result Date: 02/16/2018 CLINICAL DATA:  Pelvic pain and bilateral adnexal tenderness, greater on the right. EXAM: TRANSABDOMINAL AND TRANSVAGINAL  ULTRASOUND OF PELVIS DOPPLER ULTRASOUND OF OVARIES TECHNIQUE: Both transabdominal and transvaginal ultrasound examinations of the pelvis were performed. Transabdominal technique was performed for global imaging of the pelvis including uterus, ovaries, adnexal regions, and pelvic cul-de-sac. It was necessary to proceed with endovaginal exam following the transabdominal exam to visualize the left ovary and better visualize the uterus, endometrium and right ovary. Color and duplex Doppler ultrasound was utilized to evaluate blood flow to the ovaries. COMPARISON:  Obstetric ultrasound dated 01/30/2016. FINDINGS: Uterus Measurements: 9.2 x 4.2 x 3.2 cm = volume: 63 mL. No fibroids or other mass visualized. Endometrium Thickness: 7.8 mm.  No focal abnormality visualized. Right ovary Measurements: 2.8 x 2.6 x 1.6 cm = volume: 6.3 mL. Normal appearance/no adnexal mass. Left ovary Measurements: 2.3 x 2.0 x 1.5 cm = volume: 3.5 mL. Normal appearance/no adnexal mass. Pulsed Doppler evaluation of both ovaries demonstrates normal low-resistance arterial and venous waveforms. Other findings No abnormal free fluid. IMPRESSION: Normal examination. Electronically Signed   By: Beckie Salts M.D.   On: 02/16/2018 14:45   US Pelvis Complete  Result Date: 02/16/2018 CLINICAL DATA:  Pelvic pain and bilateral adnexal tenderness, greater on the right. EXAM: TRANSABDOMINAL AND TRANSVAGINAL ULTRASOUND OF PELVIS DOPPLER ULTRASOUND OF OVARIES TECHNIQUE: Both transabdominal and transvaginal ultrasound examinations of the pelvis were performed. Transabdominal technique was performed for global imaging of the pelvis including uterus, ovaries, adnexal regions, and pelvic cul-de-sac. It was necessary to proceed with endovaginal exam following the transabdominal exam to visualize the left ovary and better visualize the uterus, endometrium and right ovary. Color and duplex Doppler ultrasound was utilized to evaluate blood flow to the ovaries.  COMPARISON:  Obstetric ultrasound dated 01/30/2016. FINDINGS: Uterus Measurements: 9.2 x 4.2 x 3.2 cm = volume: 63 mL. No fibroids or other mass visualized. Endometrium Thickness:  7.8 mm.  No focal abnormality visualized. Right ovary Measurements: 2.8 x 2.6 x 1.6 cm = volume: 6.3 mL. Normal appearance/no adnexal mass. Left ovary Measurements: 2.3 x 2.0 x 1.5 cm = volume: 3.5 mL. Normal appearance/no adnexal mass. Pulsed Doppler evaluation of both ovaries demonstrates normal low-resistance arterial and venous waveforms. Other findings No abnormal free fluid. IMPRESSION: Normal examination. Electronically Signed   By: Beckie SaltsSteven  Reid M.D.   On: 02/16/2018 14:45   [place] Art/ven Flow Abd Pelv Doppler  Result Date: 02/16/2018 CLINICAL DATA:  Pelvic pain and bilateral adnexal tenderness, greater on the right. EXAM: TRANSABDOMINAL AND TRANSVAGINAL ULTRASOUND OF PELVIS DOPPLER ULTRASOUND OF OVARIES TECHNIQUE: Both transabdominal and transvaginal ultrasound examinations of the pelvis were performed. Transabdominal technique was performed for global imaging of the pelvis including uterus, ovaries, adnexal regions, and pelvic cul-de-sac. It was necessary to proceed with endovaginal exam following the transabdominal exam to visualize the left ovary and better visualize the uterus, endometrium and right ovary. Color and duplex Doppler ultrasound was utilized to evaluate blood flow to the ovaries. COMPARISON:  Obstetric ultrasound dated 01/30/2016. FINDINGS: Uterus Measurements: 9.2 x 4.2 x 3.2 cm = volume: 63 mL. No fibroids or other mass visualized. Endometrium Thickness: 7.8 mm.  No focal abnormality visualized. Right ovary Measurements: 2.8 x 2.6 x 1.6 cm = volume: 6.3 mL. Normal appearance/no adnexal mass. Left ovary Measurements: 2.3 x 2.0 x 1.5 cm = volume: 3.5 mL. Normal appearance/no adnexal mass. Pulsed Doppler evaluation of both ovaries demonstrates normal low-resistance arterial and venous waveforms. Other findings No  abnormal free fluid. IMPRESSION: Normal examination. Electronically Signed   By: Beckie SaltsSteven  Reid M.D.   On: 02/16/2018 14:45    Procedures Procedures (including critical care time)  Medications Ordered in ED Medications  ondansetron (ZOFRAN-ODT) disintegrating tablet 4 mg (4 mg Oral Given 02/16/18 1505)  ketorolac (TORADOL) 30 MG/ML injection 30 mg (30 mg Intramuscular Given 02/16/18 1501)     Initial Impression / Assessment and Plan / ED Course  I have reviewed the triage vital signs and the nursing notes.  Pertinent labs & imaging results that were available during my care of the patient were reviewed by me and considered in my medical decision making (see chart for details).     Patient presenting with probable pelvic inflammatory disease.  She does have a white discharge.  Wet prep is negative.  GC/chlamydia sent and pending.  Labs are unremarkable.  Pelvic ultrasound is normal.  Patient with stable vitals and is afebrile.  Patient's pain has been coming and going for the past 2 weeks and at times has no pain.  Although she does have some right-sided pain, it is unlikely this is an appendicitis.  She has tenderness bilaterally in her labs are completely unremarkable.  She is very well-appearing and nontoxic appearing.  Patient will be referred to OB/GYN.  Advised prophylactic treatment with Rocephin and doxycycline, however patient would like to hold off on Rocephin until results return.  She agrees to doxycycline, as she was positive for chlamydia in the past and has only been sexually active once since then.  Patient will be referred to OB/GYN for further management.  Return precautions discussed including persistent pain in the right lower quadrant, persistent fever 100.4, or any other new or concerning symptoms.  She understands and agrees with plan.  Patient vitals stable throughout ED course and discharged in satisfactory condition. I discussed patient case with Dr. Juleen ChinaKohut who guided the  patient's management and agrees with  plan.   Final Clinical Impressions(s) / ED Diagnoses   Final diagnoses:  Pelvic pain in female    ED Discharge Orders         Ordered    doxycycline (VIBRAMYCIN) 100 MG capsule  2 times daily     02/16/18 1537    naproxen (NAPROSYN) 500 MG tablet  2 times daily     02/16/18 1537    ondansetron (ZOFRAN) 4 MG tablet  Every 6 hours     02/16/18 [address], [place], [place] 02/16/18 1542    Raeford Razor, MD 02/17/18 [phone]

## 2018-02-16 NOTE — ED Triage Notes (Signed)
Pt states lower abdominal pain x2 weeks, more on the right than left. Pt states pain is exacerbated with standing.  Pt states that pain comes and goes . Pt states she becomes nauseated when the pain increases. Pt states that pain feels like contractions. Negative preg test 02/04/18.

## 2018-02-17 LAB — GC/CHLAMYDIA PROBE AMP (~~LOC~~) NOT AT ARMC
Chlamydia: NEGATIVE
Neisseria Gonorrhea: NEGATIVE

## 2018-02-19 ENCOUNTER — Encounter (HOSPITAL_COMMUNITY): Payer: Self-pay

## 2018-02-19 ENCOUNTER — Other Ambulatory Visit: Payer: Self-pay

## 2018-02-19 ENCOUNTER — Emergency Department (HOSPITAL_COMMUNITY): Payer: Medicaid Other

## 2018-02-19 ENCOUNTER — Emergency Department (HOSPITAL_COMMUNITY)
Admission: EM | Admit: 2018-02-19 | Discharge: 2018-02-19 | Disposition: A | Discharge status: Home / Self Care | Payer: Medicaid Other | Attending: Emergency Medicine | Admitting: Emergency Medicine

## 2018-02-19 DIAGNOSIS — F319 Bipolar disorder, unspecified: Secondary | ICD-10-CM | POA: Diagnosis not present

## 2018-02-19 DIAGNOSIS — F419 Anxiety disorder, unspecified: Secondary | ICD-10-CM | POA: Insufficient documentation

## 2018-02-19 DIAGNOSIS — Z79899 Other long term (current) drug therapy: Secondary | ICD-10-CM | POA: Insufficient documentation

## 2018-02-19 DIAGNOSIS — R102 Pelvic and perineal pain: Secondary | ICD-10-CM | POA: Diagnosis present

## 2018-02-19 DIAGNOSIS — F17290 Nicotine dependence, other tobacco product, uncomplicated: Secondary | ICD-10-CM | POA: Diagnosis not present

## 2018-02-19 DIAGNOSIS — R1030 Lower abdominal pain, unspecified: Secondary | ICD-10-CM | POA: Insufficient documentation

## 2018-02-19 LAB — URINALYSIS, ROUTINE W REFLEX MICROSCOPIC
Bacteria, UA: NONE SEEN
Bilirubin Urine: NEGATIVE
GLUCOSE, UA: NEGATIVE mg/dL
Ketones, ur: NEGATIVE mg/dL
Leukocytes, UA: NEGATIVE
Nitrite: NEGATIVE
Protein, ur: NEGATIVE mg/dL
Specific Gravity, Urine: 1.005 (ref 1.005–1.030)
pH: 6 (ref 5.0–8.0)

## 2018-02-19 LAB — COMPREHENSIVE METABOLIC PANEL
ALT: 53 U/L — AB (ref 0–44)
AST: 29 U/L (ref 15–41)
Albumin: 4.1 g/dL (ref 3.5–5.0)
Alkaline Phosphatase: 53 U/L (ref 38–126)
Anion gap: 6 (ref 5–15)
BUN: 9 mg/dL (ref 6–20)
CO2: 23 mmol/L (ref 22–32)
Calcium: 8.9 mg/dL (ref 8.9–10.3)
Chloride: 110 mmol/L (ref 98–111)
Creatinine, Ser: 0.69 mg/dL (ref 0.44–1.00)
GFR calc Af Amer: >60 mL/min (ref >60)
GFR calc non Af Amer: >60 mL/min (ref >60)
GLUCOSE: 110 mg/dL — AB (ref 70–99)
Potassium: 4 mmol/L (ref 3.5–5.1)
Sodium: 139 mmol/L (ref 135–145)
Total Bilirubin: 0.6 mg/dL (ref 0.3–1.2)
Total Protein: 7.1 g/dL (ref 6.5–8.1)

## 2018-02-19 LAB — CBC
HCT: 41.8 % (ref 36.0–46.0)
Hemoglobin: 13.5 g/dL (ref 12.0–15.0)
MCH: 30 pg (ref 26.0–34.0)
MCHC: 32.3 g/dL (ref 30.0–36.0)
MCV: 92.9 fL (ref 80.0–100.0)
Platelets: 237 10*3/uL (ref 150–400)
RBC: 4.5 MIL/uL (ref 3.87–5.11)
RDW: 13.3 % (ref 11.5–15.5)
WBC: 7.6 10*3/uL (ref 4.0–10.5)
nRBC: 0 % (ref 0.0–0.2)

## 2018-02-19 LAB — HCG, QUANTITATIVE, PREGNANCY: hCG, Beta Chain, Quant, S: <1 m[IU]/mL (ref <5)

## 2018-02-19 LAB — LIPASE, BLOOD: Lipase: 30 U/L (ref 11–51)

## 2018-02-19 MED ORDER — IOPAMIDOL (ISOVUE-300) INJECTION 61%
INTRAVENOUS | Status: AC
  Filled 2018-02-19: qty 100

## 2018-02-19 MED ORDER — SODIUM CHLORIDE (PF) 0.9 % IJ SOLN
INTRAMUSCULAR | Status: AC
  Filled 2018-02-19: qty 50

## 2018-02-19 MED ORDER — ONDANSETRON 4 MG PO TBDP
4.0000 mg | ORAL_TABLET | Freq: Once | ORAL | Status: AC
  Administered 2018-02-19: 4 mg via ORAL
  Filled 2018-02-19: qty 1

## 2018-02-19 MED ORDER — IOPAMIDOL (ISOVUE-300) INJECTION 61%
100.0000 mL | Freq: Once | INTRAVENOUS | Status: AC | PRN
  Administered 2018-02-19: 100 mL via INTRAVENOUS

## 2018-02-19 MED ORDER — SODIUM CHLORIDE 0.9% FLUSH: 3.0000 mL | Freq: Once | INTRAVENOUS | Status: DC

## 2018-02-19 NOTE — Discharge Instructions (Addendum)
Your CT scan today shows NORMAL liver, gallbladder, pancreas, spleen, adrenal glands, urinary tract, appendix, stomach, bowels, uterus and ovaries. Follow up with your GYN doctor. Continue the medications as directed.

## 2018-02-19 NOTE — ED Triage Notes (Addendum)
Pt states that she is having right sided abdominal pain. Pain is worse when standing.

## 2018-02-19 NOTE — ED Provider Notes (Signed)
[facility]-EMERGENCY DEPT Provider Note   CSN: [id number] Arrival date & time: 02/19/18  1122     History   Chief Complaint Chief Complaint  Patient presents with  . Abdominal Pain    HPI [name] is a 18 y.o. female with history of anxiety, depression, PTSD presents with pelvic pain for the past 2 weeks.  It has been intermittent but reports is now more persistent and more in the RLQ. patient was evaluated at Urgent Care when the pain started and had labs that were normal. She reports around Christmas, she had 5 weeks of vaginal bleeding and then a couple weeks later had a week of her normal menstrual cycle.  That has stopped.  She denies any other vaginal discharge.  She is sexually active and wears a weekly patch for birth control.  She also uses condoms most of the time.  She was tested for STD at her last visit here 3 days ago and they were negative. She also had a pelvic ultrasound that was normal. She denies any urinary symptoms.  She reports some associated nausea when the pain is bad.  Patient reports nausea today and change in BM's. States she is going less often and stools are firm. Patient's "mentor" is with her today.  The history is provided by the patient. No language interpreter was used.  Abdominal Pain  Pain location:  Generalized Pain quality: cramping and sharp   Pain radiates to:  Does not radiate Pain severity:  Moderate Onset quality:  Gradual Duration:  2 weeks Timing:  Intermittent Progression:  Worsening Chronicity:  New Relieved by:  Nothing Worsened by:  Movement Associated symptoms: nausea   Associated symptoms: no chest pain, no chills, no cough, no diarrhea, no dysuria, no hematuria, no shortness of breath, no sore throat, no vaginal bleeding and no vaginal discharge   Risk factors: not pregnant     Past Medical History:  Diagnosis Date  . Anxiety   . Headache(784.0)   . Hx of suicide attempt   . Major depressive  disorder   . PTSD (post-traumatic stress disorder)     Patient Active Problem List   Diagnosis Date Noted  . Encounter for initial prescription of transdermal patch hormonal contraceptive device 01/30/2018  . MDD (major depressive disorder), recurrent episode, severe (HCC) 10/10/2017  . Bipolar disorder (HCC) 08/23/2017  . Substance use disorder 06/08/2017  . Elevated blood pressure reading 04/13/2017  . 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 Hx of suicide attempt 03/19/2016  . PTSD (post-traumatic stress disorder) 03/19/2016  . Generalized anxiety disorder   . 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 Suicidal ideation     History reviewed. No pertinent surgical history.   OB History    Gravida  1   Para  1   Term  1   Preterm  0   AB  0   Living  1     SAB  0   TAB  0   Ectopic  0   Multiple  0   Live Births  1            Home Medications    Prior to Admission medications   Medication Sig Start Date End Date Taking? Authorizing Provider  albuterol (PROVENTIL HFA;VENTOLIN HFA) 108 (90 Base) MCG/ACT inhaler Inhale 1 puff into the lungs every 6 (six) hours as needed for wheezing or shortness of breath. 10/13/17   Sanjuana KavaNwoko, Agnes I, NP  cariprazine (VRAYLAR) capsule Take 3 mg by mouth.     [provider]  doxycycline (VIBRAMYCIN) 100 MG capsule Take 1 capsule (100 mg total) by mouth 2 (two) times daily for 14 days. 02/16/18 03/02/18  Emi Holes, PA-C  naproxen (NAPROSYN) 500 MG tablet Take 1 tablet (500 mg total) by mouth 2 (two) times daily. 02/16/18   Law, Waylan Boga, PA-C  norelgestromin-ethinyl estradiol (ORTHO EVRA) 150-35 MCG/24HR transdermal patch Place 1 patch onto the skin once a week. Patient taking differently: Place 1 patch onto the skin once a week. Monday 01/30/18   [name], FNP  ondansetron (ZOFRAN) 4 MG tablet Take 1 tablet (4 mg total) by mouth every 6 (six) hours. 02/16/18   Law,  Waylan Boga, PA-C  prazosin (MINIPRESS) 2 MG capsule Take 2 mg by mouth at bedtime. 01/31/18   [provider]  valACYclovir (VALTREX) 500 MG tablet Take 1 tablet (500 mg total) by mouth 2 (two) times daily. Patient taking differently: Take 500 mg by mouth as needed (outbreak).  01/30/18   [name], FNP  zolpidem (AMBIEN) 5 MG tablet Take 5 mg by mouth at bedtime as needed for sleep.  10/19/17   [provider]    Family History Family History  Problem Relation Age of Onset  . Diabetes Maternal Aunt   . Diabetes Maternal Grandmother   . Cancer Maternal Grandmother   . Breast cancer Other     Social History Social History   Tobacco Use  . Smoking status: Current Every Day Smoker    Packs/day: 0.25    Types: Cigars  . Smokeless tobacco: Never Used  . Tobacco comment: black and mild  Substance Use Topics  . Alcohol use: Yes    Comment: socially  . Drug use: Yes    Types: Marijuana, MDMA (Ecstacy)     Allergies   Lactose intolerance (gi) and Tape   Review of Systems Review of Systems  Constitutional: Negative for chills.  HENT: Positive for congestion. Negative for sinus pain and sore throat.   Eyes: Negative for pain and discharge.  Respiratory: Negative for cough and shortness of breath.   Cardiovascular: Negative for chest pain.  Gastrointestinal: Positive for abdominal pain and nausea. Negative for diarrhea.  Genitourinary: Negative for dysuria, hematuria, urgency, vaginal bleeding and vaginal discharge.  Musculoskeletal: Negative for gait problem.  Skin: Negative for rash.  Neurological: Negative for syncope and headaches.  Psychiatric/Behavioral: Negative for confusion.     Physical Exam Updated Vital Signs BP (!) 150/84 (BP Location: Left Arm)   Pulse 68   Temp 98.5 F (36.9 C) (Oral)   Resp 18   Ht 5\' 9"  (1.753 m)   Wt 112 kg   LMP 02/01/2018   SpO2 100%   BMI 36.46 kg/m   Physical Exam Vitals signs and nursing note  reviewed.  Constitutional:      Appearance: She is well-developed.  HENT:     Head: Normocephalic.     Nose: Congestion present.  Neck:     Musculoskeletal: Neck supple.  Cardiovascular:     Rate and Rhythm: Normal rate and regular rhythm.  Pulmonary:     Effort: Pulmonary effort is normal.     Breath sounds: Normal breath sounds.  Abdominal:     General: Bowel sounds are increased.     Palpations: Abdomen is soft.     Tenderness: There is abdominal tenderness in the right lower quadrant. There is no right CVA tenderness,  left CVA tenderness, guarding or rebound.  Genitourinary:    Comments: Pelvic exam done 3 days ago and not repeated today. Musculoskeletal: Normal range of motion.  Skin:    General: Skin is warm and dry.  Neurological:     Mental Status: She is alert and oriented to person, place, and time.      ED Treatments / Results  Labs (all labs ordered are listed, but only abnormal results are displayed) Labs Reviewed  COMPREHENSIVE METABOLIC PANEL - Abnormal; Notable for the following components:      Result Value   Glucose, Bld 110 (*)    ALT 53 (*)    All other components within normal limits  URINALYSIS, ROUTINE W REFLEX MICROSCOPIC - Abnormal; Notable for the following components:   Color, Urine STRAW (*)    Hgb urine dipstick SMALL (*)    All other components within normal limits  LIPASE, BLOOD  CBC  HCG, QUANTITATIVE, PREGNANCY   Radiology Ct Abdomen Pelvis W Contrast  Result Date: 02/19/2018 CLINICAL DATA:  Nausea, vomiting and abdominal pain. EXAM: CT ABDOMEN AND PELVIS WITH CONTRAST TECHNIQUE: Multidetector CT imaging of the abdomen and pelvis was performed using the standard protocol following bolus administration of intravenous contrast. CONTRAST:  <See Chart> ISOVUE-300 IOPAMIDOL (ISOVUE-300) INJECTION 61% COMPARISON:  11/22/2016 FINDINGS: Lower chest: No acute abnormality. Hepatobiliary: No focal liver abnormality is seen. No gallstones,  gallbladder wall thickening, or biliary dilatation. Pancreas: Unremarkable. No pancreatic ductal dilatation or surrounding inflammatory changes. Spleen: Normal in size without focal abnormality. Adrenals/Urinary Tract: The adrenal glands are unremarkable. No kidney mass or hydronephrosis identified. Urinary bladder appears within normal limits. Stomach/Bowel: Stomach is within normal limits. Appendix appears normal. No evidence of bowel wall thickening, distention, or inflammatory changes. Vascular/Lymphatic: No significant vascular findings are present. No enlarged abdominal or pelvic lymph nodes. Reproductive: Uterus and bilateral adnexa are unremarkable. Other: No free fluid or fluid collections. Musculoskeletal: No acute or significant osseous findings. IMPRESSION: 1. No acute findings within the abdomen or pelvis. Electronically Signed   By: Signa Kellaylor  Stroud M.D.   On: 02/19/2018 14:14    Procedures Procedures (including critical care time)  Medications Ordered in ED Medications  ondansetron (ZOFRAN-ODT) disintegrating tablet 4 mg (4 mg Oral Given 02/19/18 1246)  iopamidol (ISOVUE-300) 61 % injection 100 mL (100 mLs Intravenous Contrast Given 02/19/18 1346)     Initial Impression / Assessment and Plan / ED Course  I have reviewed the triage vital signs and the nursing notes. 18 y.o.  Female here with lower abdominal/pelvic pain that has been off and on x 2 weeks stable for d/c with normal labs and CT scan. Discussed with the patient f/u with her GYN. She will continue her medications prescribed 3 days ago.   Final Clinical Impressions(s) / ED Diagnoses   Final diagnoses:  Lower abdominal pain    ED Discharge Orders    None       Kerrie Buffaloeese, Dotty Gonzalo BristolM, [place]NP 02/19/18 1426    Shaune PollackIsaacs, [place], MD 02/20/18 [phone]

## 2018-03-01 ENCOUNTER — Ambulatory Visit (INDEPENDENT_AMBULATORY_CARE_PROVIDER_SITE_OTHER): Payer: Medicaid Other | Admitting: Pediatrics

## 2018-03-01 ENCOUNTER — Encounter: Payer: Self-pay | Admitting: Pediatrics

## 2018-03-01 ENCOUNTER — Other Ambulatory Visit (HOSPITAL_COMMUNITY)
Admission: RE | Admit: 2018-03-01 | Discharge: 2018-03-01 | Disposition: A | Discharge status: Home / Self Care | Payer: Medicaid Other | Source: Ambulatory Visit | Attending: Certified Nurse Midwife | Admitting: Certified Nurse Midwife

## 2018-03-01 ENCOUNTER — Encounter: Payer: Self-pay | Admitting: Certified Nurse Midwife

## 2018-03-01 ENCOUNTER — Ambulatory Visit (INDEPENDENT_AMBULATORY_CARE_PROVIDER_SITE_OTHER): Payer: Medicaid Other | Admitting: Certified Nurse Midwife

## 2018-03-01 VITALS — BP 133/84 | HR 72 | Ht 69.0 in | Wt 242.0 lb

## 2018-03-01 VITALS — BP 122/75 | HR 79 | Ht 67.42 in | Wt 243.8 lb

## 2018-03-01 DIAGNOSIS — B373 Candidiasis of vulva and vagina: Secondary | ICD-10-CM | POA: Diagnosis not present

## 2018-03-01 DIAGNOSIS — Z3009 Encounter for other general counseling and advice on contraception: Secondary | ICD-10-CM | POA: Diagnosis not present

## 2018-03-01 DIAGNOSIS — N911 Secondary amenorrhea: Secondary | ICD-10-CM

## 2018-03-01 DIAGNOSIS — R102 Pelvic and perineal pain: Secondary | ICD-10-CM | POA: Diagnosis not present

## 2018-03-01 DIAGNOSIS — Z3202 Encounter for pregnancy test, result negative: Secondary | ICD-10-CM | POA: Diagnosis not present

## 2018-03-01 DIAGNOSIS — H65191 Other acute nonsuppurative otitis media, right ear: Secondary | ICD-10-CM | POA: Diagnosis not present

## 2018-03-01 DIAGNOSIS — J069 Acute upper respiratory infection, unspecified: Secondary | ICD-10-CM

## 2018-03-01 DIAGNOSIS — B3731 Acute candidiasis of vulva and vagina: Secondary | ICD-10-CM

## 2018-03-01 LAB — POCT URINE PREGNANCY: Preg Test, Ur: NEGATIVE

## 2018-03-01 MED ORDER — FLUTICASONE PROPIONATE 50 MCG/ACT NA SUSP: 2.0000 | Freq: Every day | NASAL | 1 refills | Status: DC

## 2018-03-01 MED ORDER — MEDROXYPROGESTERONE ACETATE 10 MG PO TABS: 10.0000 mg | ORAL_TABLET | Freq: Every day | ORAL | 0 refills | Status: DC

## 2018-03-01 MED ORDER — PSEUDOEPHEDRINE HCL 30 MG PO TABS: 30.0000 mg | ORAL_TABLET | ORAL | 0 refills | Status: DC | PRN

## 2018-03-01 NOTE — Progress Notes (Signed)
History was provided by the patient.  [name] is a 18 y.o. female who is here for right ear pain  Alfonso Ramus T, FNP   HPI:  Pt reports that she is getting over a cold. She had coughing and nasal congestion for about hte pas few weeks. This is overall improving. She was taking robitussin for this. Now she is having right ear pain, popping and muffled hearing. She is worried that she has an ear infection as a result.   She is off patch and doing provera per GYN. Asks for condoms today. Discussed emergency contraception as needed.   Patient's last menstrual period was 02/01/2018.  Review of Systems  Constitutional: Negative for malaise/fatigue.  HENT: Positive for ear pain and hearing loss.   Eyes: Negative for double vision.  Respiratory: Negative for shortness of breath.   Cardiovascular: Negative for chest pain and palpitations.  Gastrointestinal: Negative for abdominal pain, constipation, diarrhea, nausea and vomiting.  Genitourinary: Negative for dysuria.  Musculoskeletal: Negative for joint pain and myalgias.  Skin: Negative for rash.  Neurological: Negative for dizziness and headaches.  Endo/Heme/Allergies: Does not bruise/bleed easily.  Psychiatric/Behavioral: Positive for depression. Negative for suicidal ideas. The patient is nervous/anxious and has insomnia.     Patient Active Problem List   Diagnosis Date Noted  . MDD (major depressive disorder), recurrent episode, severe (HCC) 10/10/2017  . Bipolar disorder (HCC) 08/23/2017  . Substance use disorder 06/08/2017  . Elevated blood pressure reading 04/13/2017  . 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 Hx of suicide attempt 03/19/2016  . PTSD (post-traumatic stress disorder) 03/19/2016  . Generalized anxiety disorder   . 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 Suicidal ideation     Current Outpatient Medications on File Prior to Visit  Medication Sig Dispense Refill  .  albuterol (PROVENTIL HFA;VENTOLIN HFA) 108 (90 Base) MCG/ACT inhaler Inhale 1 puff into the lungs every 6 (six) hours as needed for wheezing or shortness of breath.    . cariprazine (VRAYLAR) capsule Take 3 mg by mouth.     . prazosin (MINIPRESS) 2 MG capsule Take 2 mg by mouth at bedtime.    . valACYclovir (VALTREX) 500 MG tablet Take 1 tablet (500 mg total) by mouth 2 (two) times daily. (Patient taking differently: Take 500 mg by mouth as needed (outbreak). ) 6 tablet 3  . zolpidem (AMBIEN) 5 MG tablet Take 5 mg by mouth at bedtime as needed for sleep.   0   No current facility-administered medications on file prior to visit.     Allergies  Allergen Reactions  . Lactose Intolerance (Gi) Nausea And Vomiting and Other (See Comments)  . Tape Other (See Comments)    Slight skin irritation    Physical Exam:    Vitals:   03/01/18 1355  BP: 122/75  Pulse: 79  Weight: 243 lb 12.8 oz (110.6 kg)  Height: 5' 7.42" (1.712 m)    Blood pressure percentiles are not available for patients who are 18 years or older.  Physical Exam Vitals signs and nursing note reviewed.  Constitutional:      General: She is not in acute distress.    Appearance: She is well-developed.  HENT:     Right Ear: Ear canal normal. A middle ear effusion is present. There is no impacted cerumen. Tympanic membrane is not bulging.     Left Ear: Tympanic membrane and ear canal normal. There is no impacted cerumen.     Nose:  Rhinorrhea present. No congestion.     Mouth/Throat:     Pharynx: No oropharyngeal exudate or posterior oropharyngeal erythema.  Neck:     Thyroid: No thyromegaly.  Cardiovascular:     Rate and Rhythm: Normal rate and regular rhythm.     Heart sounds: No murmur.  Pulmonary:     Breath sounds: Normal breath sounds.  Abdominal:     Palpations: Abdomen is soft. There is no mass.     Tenderness: There is no abdominal tenderness. There is no guarding.  Musculoskeletal:     Right lower leg: No  edema.     Left lower leg: No edema.  Lymphadenopathy:     Cervical: No cervical adenopathy.  Skin:    General: Skin is warm.     Findings: No rash.  Neurological:     Mental Status: She is alert.     Comments: No tremor  Psychiatric:        Mood and Affect: Mood normal.     Assessment/Plan: 1. Acute effusion of right ear Discussed symptomatic management. No evidence of need for ABX.  - fluticasone (FLONASE) 50 MCG/ACT nasal spray; Place 2 sprays into both nostrils daily.  Dispense: 16 g; Refill: 1 - pseudoephedrine (SUDAFED) 30 MG tablet; Take 1 tablet (30 mg total) by mouth every 4 (four) hours as needed for congestion.  Dispense: 30 tablet; Refill: 0  2. Viral URI As above.  - fluticasone (FLONASE) 50 MCG/ACT nasal spray; Place 2 sprays into both nostrils daily.  Dispense: 16 g; Refill: 1 - pseudoephedrine (SUDAFED) 30 MG tablet; Take 1 tablet (30 mg total) by mouth every 4 (four) hours as needed for congestion.  Dispense: 30 tablet; Refill: 0  3. Emergency contraceptive counseling Discussed EC in the setting of coming off regular contraception per GYN. Provided condoms. She was in agreement.

## 2018-03-01 NOTE — Patient Instructions (Signed)
Pelvic Pain, Female  Pelvic pain is pain in your lower belly (abdomen), below your belly button and between your hips. The pain may start suddenly (be acute), keep coming back (be recurring), or last a long time (become chronic). Pelvic pain that lasts longer than 6 months is called chronic pelvic pain. There are many causes of pelvic pain. Sometimes the cause of pelvic pain is not known.  Follow these instructions at home:    · Take over-the-counter and prescription medicines only as told by your doctor.  · Rest as told by your doctor.  · Do not have sex if it hurts.  · Keep a journal of your pelvic pain. Write down:  ? When the pain started.  ? Where the pain is located.  ? What seems to make the pain better or worse, such as food or your period (menstrual cycle).  ? Any symptoms you have along with the pain.  · Keep all follow-up visits as told by your doctor. This is important.  Contact a doctor if:  · Medicine does not help your pain.  · Your pain comes back.  · You have new symptoms.  · You have unusual discharge or bleeding from your vagina.  · You have a fever or chills.  · You are having trouble pooping (constipation).  · You have blood in your pee (urine) or poop (stool).  · Your pee smells bad.  · You feel weak or light-headed.  Get help right away if:  · You have sudden pain that is very bad.  · Your pain keeps getting worse.  · You have very bad pain and also have any of these symptoms:  ? A fever.  ? Feeling sick to your stomach (nausea).  ? Throwing up (vomiting).  ? Being very sweaty.  · You pass out (lose consciousness).  Summary  · Pelvic pain is pain in your lower belly (abdomen), below your belly button and between your hips.  · There are many possible causes of pelvic pain.  · Keep a journal of your pelvic pain.  This information is not intended to replace advice given to you by your health care provider. Make sure you discuss any questions you have with your health care provider.  Document  Released: 06/30/2007 Document Revised: 06/29/2017 Document Reviewed: 06/29/2017  [employer]vier Interactive Patient Education © 2019 [employer]

## 2018-03-01 NOTE — Progress Notes (Signed)
History:  [name] is a 18 y.o. G1P1001 who presents to clinic today for continued pelvic pain. She reports pelvic pain has been occurring for the past 6 months, she was on depo for birth control when pelvic pain initially started- switched to transdermal patch and is currently on week 4 of the patch.   She was seen in the ED x2 in January and had completed full STD/pelvic examination and pelvic ultrasound. Pelvic ultrasound noted to be completely negative. She denies having a cycle in the past 6 weeks.   She describes pelvic pain as cramping like she is going to start her cycle with radiation of sharp pain specific to left side, rates pain 3/10. She denies problems with intercourse and uses condoms most of the time.    The following portions of the patient's history were reviewed and updated as appropriate: allergies, current medications, family history, past medical history, social history, past surgical history and problem list.  Review of Systems:  Review of Systems  Constitutional: Negative.   Eyes: Negative.   Cardiovascular: Negative.   Gastrointestinal: Positive for abdominal pain. Negative for constipation, diarrhea, nausea and vomiting.       Pelvic pain  Genitourinary: Negative.   Neurological: Negative.     Objective:  Physical Exam BP 133/84   Pulse 72   Ht 5\' 9"  (1.753 m)   Wt 242 lb (109.8 kg)   LMP 02/01/2018   BMI 35.74 kg/m  Physical Exam Constitutional:      General: She is not in acute distress.    Appearance: She is well-developed. She is obese.  Abdominal:     General: Abdomen is flat. Bowel sounds are normal. There is no distension.     Palpations: Abdomen is soft.     Tenderness: There is abdominal tenderness.     Comments: Tenderness with palpation of left ovary   Genitourinary:    Vagina: No vaginal discharge, tenderness or bleeding.     Cervix: No cervical motion tenderness.     Uterus: Normal.      Adnexa:        Right: No mass or  tenderness.         Left: Tenderness present. No mass.    Skin:    General: Skin is warm and dry.  Neurological:     Mental Status: She is alert and oriented to person, place, and time.    Labs and Imaging Results for orders placed or performed in visit on 03/01/18 (from the past 24 hour(s))  POCT urine pregnancy     Status: None   Collection Time: 03/01/18  8:56 AM  Result Value Ref Range   Preg Test, Ur Negative Negative   US Transvaginal Non-ob  Result Date: 02/16/2018 CLINICAL DATA:  Pelvic pain and bilateral adnexal tenderness, greater on the right. EXAM: TRANSABDOMINAL AND TRANSVAGINAL ULTRASOUND OF PELVIS DOPPLER ULTRASOUND OF OVARIES TECHNIQUE: Both transabdominal and transvaginal ultrasound examinations of the pelvis were performed. Transabdominal technique was performed for global imaging of the pelvis including uterus, ovaries, adnexal regions, and pelvic cul-de-sac. It was necessary to proceed with endovaginal exam following the transabdominal exam to visualize the left ovary and better visualize the uterus, endometrium and right ovary. Color and duplex Doppler ultrasound was utilized to evaluate blood flow to the ovaries. COMPARISON:  Obstetric ultrasound dated 01/30/2016. FINDINGS: Uterus Measurements: 9.2 x 4.2 x 3.2 cm = volume: 63 mL. No fibroids or other mass visualized. Endometrium Thickness: 7.8 mm.  No focal abnormality  visualized. Right ovary Measurements: 2.8 x 2.6 x 1.6 cm = volume: 6.3 mL. Normal appearance/no adnexal mass. Left ovary Measurements: 2.3 x 2.0 x 1.5 cm = volume: 3.5 mL. Normal appearance/no adnexal mass. Pulsed Doppler evaluation of both ovaries demonstrates normal low-resistance arterial and venous waveforms. Other findings No abnormal free fluid. IMPRESSION: Normal examination. Electronically Signed   By: Beckie SaltsSteven  Reid M.D.   On: 02/16/2018 14:45   [place] Pelvis Complete  Result Date: 02/16/2018 CLINICAL DATA:  Pelvic pain and bilateral adnexal tenderness,  greater on the right. EXAM: TRANSABDOMINAL AND TRANSVAGINAL ULTRASOUND OF PELVIS DOPPLER ULTRASOUND OF OVARIES TECHNIQUE: Both transabdominal and transvaginal ultrasound examinations of the pelvis were performed. Transabdominal technique was performed for global imaging of the pelvis including uterus, ovaries, adnexal regions, and pelvic cul-de-sac. It was necessary to proceed with endovaginal exam following the transabdominal exam to visualize the left ovary and better visualize the uterus, endometrium and right ovary. Color and duplex Doppler ultrasound was utilized to evaluate blood flow to the ovaries. COMPARISON:  Obstetric ultrasound dated 01/30/2016. FINDINGS: Uterus Measurements: 9.2 x 4.2 x 3.2 cm = volume: 63 mL. No fibroids or other mass visualized. Endometrium Thickness: 7.8 mm.  No focal abnormality visualized. Right ovary Measurements: 2.8 x 2.6 x 1.6 cm = volume: 6.3 mL. Normal appearance/no adnexal mass. Left ovary Measurements: 2.3 x 2.0 x 1.5 cm = volume: 3.5 mL. Normal appearance/no adnexal mass. Pulsed Doppler evaluation of both ovaries demonstrates normal low-resistance arterial and venous waveforms. Other findings No abnormal free fluid. IMPRESSION: Normal examination. Electronically Signed   By: Beckie SaltsSteven  Reid M.D.   On: 02/16/2018 14:45   Ct Abdomen Pelvis W Contrast  Result Date: 02/19/2018 CLINICAL DATA:  Nausea, vomiting and abdominal pain. EXAM: CT ABDOMEN AND PELVIS WITH CONTRAST TECHNIQUE: Multidetector CT imaging of the abdomen and pelvis was performed using the standard protocol following bolus administration of intravenous contrast. CONTRAST:  <See Chart> ISOVUE-300 IOPAMIDOL (ISOVUE-300) INJECTION 61% COMPARISON:  11/22/2016 FINDINGS: Lower chest: No acute abnormality. Hepatobiliary: No focal liver abnormality is seen. No gallstones, gallbladder wall thickening, or biliary dilatation. Pancreas: Unremarkable. No pancreatic ductal dilatation or surrounding inflammatory changes.  Spleen: Normal in size without focal abnormality. Adrenals/Urinary Tract: The adrenal glands are unremarkable. No kidney mass or hydronephrosis identified. Urinary bladder appears within normal limits. Stomach/Bowel: Stomach is within normal limits. Appendix appears normal. No evidence of bowel wall thickening, distention, or inflammatory changes. Vascular/Lymphatic: No significant vascular findings are present. No enlarged abdominal or pelvic lymph nodes. Reproductive: Uterus and bilateral adnexa are unremarkable. Other: No free fluid or fluid collections. Musculoskeletal: No acute or significant osseous findings. IMPRESSION: 1. No acute findings within the abdomen or pelvis. Electronically Signed   By: Signa Kellaylor  Stroud M.D.   On: 02/19/2018 14:14   [place] Art/ven Flow Abd Pelv Doppler  Result Date: 02/16/2018 CLINICAL DATA:  Pelvic pain and bilateral adnexal tenderness, greater on the right. EXAM: TRANSABDOMINAL AND TRANSVAGINAL ULTRASOUND OF PELVIS DOPPLER ULTRASOUND OF OVARIES TECHNIQUE: Both transabdominal and transvaginal ultrasound examinations of the pelvis were performed. Transabdominal technique was performed for global imaging of the pelvis including uterus, ovaries, adnexal regions, and pelvic cul-de-sac. It was necessary to proceed with endovaginal exam following the transabdominal exam to visualize the left ovary and better visualize the uterus, endometrium and right ovary. Color and duplex Doppler ultrasound was utilized to evaluate blood flow to the ovaries. COMPARISON:  Obstetric ultrasound dated 01/30/2016. FINDINGS: Uterus Measurements: 9.2 x 4.2 x 3.2 cm = volume: 63 mL. No  fibroids or other mass visualized. Endometrium Thickness: 7.8 mm.  No focal abnormality visualized. Right ovary Measurements: 2.8 x 2.6 x 1.6 cm = volume: 6.3 mL. Normal appearance/no adnexal mass. Left ovary Measurements: 2.3 x 2.0 x 1.5 cm = volume: 3.5 mL. Normal appearance/no adnexal mass. Pulsed Doppler evaluation of both  ovaries demonstrates normal low-resistance arterial and venous waveforms. Other findings No abnormal free fluid. IMPRESSION: Normal examination. Electronically Signed   By: Beckie Salts M.D.   On: 02/16/2018 14:45   Assessment & Plan:  1. Pelvic pain in female - UPT negative  - POCT urine pregnancy - Cervicovaginal ancillary only( )  2. Secondary amenorrhea - Discussed stopping transdermal patch since and starting provera to induce cycle - If cycle starts and pelvic pain is resolved f/u in 4 weeks to discuss BC options that will hopefully keep regular cycles to decrease ovary pain  - medroxyPROGESTERone (PROVERA) 10 MG tablet; Take 1 tablet (10 mg total) by mouth daily. Use for ten days  Dispense: 10 tablet; Refill: 0   Sharyon Cable, [place] 03/01/2018 9:19 AM

## 2018-03-01 NOTE — Patient Instructions (Signed)
Sudafed every 4 hours  Ibuprofen 400 mg as needed for pain  flonase 2 sprays in each nostril once daily   Always use condoms  Plan b if needed

## 2018-03-02 LAB — CERVICOVAGINAL ANCILLARY ONLY
Bacterial vaginitis: NEGATIVE
Candida vaginitis: POSITIVE — AB
Chlamydia: NEGATIVE
Neisseria Gonorrhea: NEGATIVE
Trichomonas: NEGATIVE

## 2018-03-02 MED ORDER — FLUCONAZOLE 150 MG PO TABS: 150.0000 mg | ORAL_TABLET | Freq: Once | ORAL | 0 refills | Status: AC

## 2018-03-02 NOTE — Addendum Note (Signed)
Addended by: Sharyon Cable on: 03/02/2018 03:26 PM   Modules accepted: Orders

## 2018-03-08 ENCOUNTER — Other Ambulatory Visit: Payer: Self-pay | Admitting: Registered Nurse

## 2018-03-08 ENCOUNTER — Encounter (HOSPITAL_COMMUNITY): Payer: Self-pay | Admitting: *Deleted

## 2018-03-08 ENCOUNTER — Inpatient Hospital Stay (HOSPITAL_COMMUNITY)
Admission: AD | Admit: 2018-03-08 | Discharge: 2018-03-13 | DRG: 885 | Disposition: A | Discharge status: Home / Self Care | Payer: Medicaid Other | Attending: Psychiatry | Admitting: Psychiatry

## 2018-03-08 ENCOUNTER — Other Ambulatory Visit: Payer: Self-pay

## 2018-03-08 DIAGNOSIS — F1290 Cannabis use, unspecified, uncomplicated: Secondary | ICD-10-CM | POA: Diagnosis not present

## 2018-03-08 DIAGNOSIS — Z915 Personal history of self-harm: Secondary | ICD-10-CM

## 2018-03-08 DIAGNOSIS — F4310 Post-traumatic stress disorder, unspecified: Secondary | ICD-10-CM | POA: Diagnosis not present

## 2018-03-08 DIAGNOSIS — F429 Obsessive-compulsive disorder, unspecified: Secondary | ICD-10-CM | POA: Diagnosis not present

## 2018-03-08 DIAGNOSIS — F17210 Nicotine dependence, cigarettes, uncomplicated: Secondary | ICD-10-CM | POA: Diagnosis present

## 2018-03-08 DIAGNOSIS — Z7951 Long term (current) use of inhaled steroids: Secondary | ICD-10-CM

## 2018-03-08 DIAGNOSIS — E739 Lactose intolerance, unspecified: Secondary | ICD-10-CM | POA: Diagnosis not present

## 2018-03-08 DIAGNOSIS — Z91048 Other nonmedicinal substance allergy status: Secondary | ICD-10-CM | POA: Diagnosis not present

## 2018-03-08 DIAGNOSIS — Z833 Family history of diabetes mellitus: Secondary | ICD-10-CM

## 2018-03-08 DIAGNOSIS — F411 Generalized anxiety disorder: Secondary | ICD-10-CM | POA: Diagnosis not present

## 2018-03-08 DIAGNOSIS — R45851 Suicidal ideations: Secondary | ICD-10-CM | POA: Diagnosis not present

## 2018-03-08 DIAGNOSIS — F332 Major depressive disorder, recurrent severe without psychotic features: Principal | ICD-10-CM

## 2018-03-08 DIAGNOSIS — J069 Acute upper respiratory infection, unspecified: Secondary | ICD-10-CM

## 2018-03-08 DIAGNOSIS — H65191 Other acute nonsuppurative otitis media, right ear: Secondary | ICD-10-CM

## 2018-03-08 DIAGNOSIS — Z62819 Personal history of unspecified abuse in childhood: Secondary | ICD-10-CM | POA: Diagnosis present

## 2018-03-08 LAB — CBC
HCT: 41.4 % (ref 36.0–46.0)
Hemoglobin: 13.4 g/dL (ref 12.0–15.0)
MCH: 30.4 pg (ref 26.0–34.0)
MCHC: 32.4 g/dL (ref 30.0–36.0)
MCV: 93.9 fL (ref 80.0–100.0)
Platelets: 214 10*3/uL (ref 150–400)
RBC: 4.41 MIL/uL (ref 3.87–5.11)
RDW: 13.1 % (ref 11.5–15.5)
WBC: 8.2 10*3/uL (ref 4.0–10.5)
nRBC: 0 % (ref 0.0–0.2)

## 2018-03-08 LAB — LIPID PANEL
Cholesterol: 213 mg/dL — ABNORMAL HIGH (ref 0–200)
HDL: 54 mg/dL (ref >40)
LDL CALC: 149 mg/dL — AB (ref 0–99)
Total CHOL/HDL Ratio: 3.9 RATIO
Triglycerides: 49 mg/dL (ref <150)
VLDL: 10 mg/dL (ref 0–40)

## 2018-03-08 LAB — COMPREHENSIVE METABOLIC PANEL
ALT: 22 U/L (ref 0–44)
AST: 23 U/L (ref 15–41)
Albumin: 4.1 g/dL (ref 3.5–5.0)
Alkaline Phosphatase: 54 U/L (ref 38–126)
Anion gap: 9 (ref 5–15)
BUN: 8 mg/dL (ref 6–20)
CO2: 25 mmol/L (ref 22–32)
Calcium: 9 mg/dL (ref 8.9–10.3)
Chloride: 105 mmol/L (ref 98–111)
Creatinine, Ser: 0.76 mg/dL (ref 0.44–1.00)
GFR calc Af Amer: >60 mL/min (ref >60)
GFR calc non Af Amer: >60 mL/min (ref >60)
Glucose, Bld: 232 mg/dL — ABNORMAL HIGH (ref 70–99)
POTASSIUM: 3.5 mmol/L (ref 3.5–5.1)
Sodium: 139 mmol/L (ref 135–145)
Total Bilirubin: 0.4 mg/dL (ref 0.3–1.2)
Total Protein: 7.4 g/dL (ref 6.5–8.1)

## 2018-03-08 LAB — ETHANOL: Alcohol, Ethyl (B): <10 mg/dL (ref <10)

## 2018-03-08 LAB — TSH: TSH: 0.148 u[IU]/mL — ABNORMAL LOW (ref 0.350–4.500)

## 2018-03-08 MED ORDER — NICOTINE POLACRILEX 2 MG MT GUM
2.0000 mg | CHEWING_GUM | OROMUCOSAL | Status: DC | PRN
  Administered 2018-03-09 – 2018-03-11 (×3): 2 mg via ORAL
  Filled 2018-03-08: qty 1

## 2018-03-08 MED ORDER — TRAZODONE HCL 50 MG PO TABS
50.0000 mg | ORAL_TABLET | Freq: Every evening | ORAL | Status: DC | PRN
  Administered 2018-03-08 – 2018-03-12 (×6): 50 mg via ORAL
  Filled 2018-03-08 (×13): qty 1

## 2018-03-08 MED ORDER — HYDROXYZINE HCL 25 MG PO TABS
25.0000 mg | ORAL_TABLET | Freq: Four times a day (QID) | ORAL | Status: DC | PRN
  Administered 2018-03-08 – 2018-03-12 (×5): 25 mg via ORAL
  Filled 2018-03-08 (×2): qty 1
  Filled 2018-03-08: qty 10
  Filled 2018-03-08: qty 1

## 2018-03-08 NOTE — Progress Notes (Signed)
Patient ID: [name], female   DOB: 06-16-2000, 18 y.o.   MRN: [id number]  Urine specimen given to patient for collection. Patient verbalizes understanding to return to staff.

## 2018-03-08 NOTE — Tx Team (Signed)
Initial Treatment Plan 03/08/2018 6:04 PM [place] Christell Constant KGO:[id number]    PATIENT STRESSORS: Marital or family conflict Medication change or noncompliance Substance abuse   PATIENT STRENGTHS: Ability for insight Average or above average intelligence Capable of independent living General fund of knowledge Motivation for treatment/growth   PATIENT IDENTIFIED PROBLEMS: Depression Suicidal thoughts "I think I need a day program"                     DISCHARGE CRITERIA:  Ability to meet basic life and health needs Improved stabilization in mood, thinking, and/or behavior Reduction of life-threatening or endangering symptoms to within safe limits Verbal commitment to aftercare and medication compliance  PRELIMINARY DISCHARGE PLAN: Attend aftercare/continuing care group Return to previous living arrangement  PATIENT/FAMILY INVOLVEMENT: This treatment plan has been presented to and reviewed with the patient, [name], and/or family member, .  The patient and family have been given the opportunity to ask questions and make suggestions.  [name], [place], [name] 03/08/2018, 6:04 PM
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
## 2018-03-08 NOTE — Progress Notes (Signed)
[name] is a 18 year old female pt admitted on voluntary basis after presenting as a walk-in. [name] explained that she has been feeling depressed and suicidal and spoke about how she went to peer support today and advised them to come into the hospital. She is able to contract for safety while in the hospital. She reports that when she was discharged last time, she ended up quitting school and stopped her medications due in part because she had no insurance. She did endorse that she used cocaine over the weekend and occasional marijuana usage. She reports that she lives with her grandparents and reports that she will return there once discharged. She was escorted to the unit, oriented to the milieu and safety maintained.

## 2018-03-08 NOTE — Progress Notes (Signed)
Nursing Progress Note: 7p-7a D: Pt currently presents with a anxious/depressed affect and behavior. Interacting appropriately with the milieu. Pt reports fair sleep during the previous night with current medication regimen. Pt did not attend wrap-up group.  A: Pt provided with medications per providers orders. Pt's labs and vitals were monitored throughout the night. Pt supported emotionally and encouraged to express concerns and questions. Pt educated on medications.  R: Pt's safety ensured with 15 minute and environmental checks. Pt currently denies SI, HI, and AVH. Pt verbally contracts to seek staff if SI,HI, or AVH occurs and to consult with staff before acting on any harmful thoughts. Will continue to monitor.

## 2018-03-09 DIAGNOSIS — F332 Major depressive disorder, recurrent severe without psychotic features: Principal | ICD-10-CM

## 2018-03-09 LAB — URINALYSIS, COMPLETE (UACMP) WITH MICROSCOPIC
Bilirubin Urine: NEGATIVE
Glucose, UA: >=500 mg/dL — AB
Ketones, ur: NEGATIVE mg/dL
Leukocytes,Ua: NEGATIVE
Nitrite: NEGATIVE
PROTEIN: NEGATIVE mg/dL
Specific Gravity, Urine: 1.002 — ABNORMAL LOW (ref 1.005–1.030)
pH: 6 (ref 5.0–8.0)

## 2018-03-09 LAB — PREGNANCY, URINE: Preg Test, Ur: NEGATIVE

## 2018-03-09 LAB — RAPID URINE DRUG SCREEN, HOSP PERFORMED
AMPHETAMINES: NOT DETECTED
Barbiturates: NOT DETECTED
Benzodiazepines: NOT DETECTED
Cocaine: NOT DETECTED
Opiates: NOT DETECTED
Tetrahydrocannabinol: NOT DETECTED

## 2018-03-09 MED ORDER — ONDANSETRON 4 MG PO TBDP
4.0000 mg | ORAL_TABLET | Freq: Four times a day (QID) | ORAL | Status: DC | PRN
  Administered 2018-03-09: 4 mg via ORAL
  Filled 2018-03-09: qty 1

## 2018-03-09 MED ORDER — MODAFINIL 200 MG PO TABS
100.0000 mg | ORAL_TABLET | Freq: Every day | ORAL | Status: DC
  Administered 2018-03-09: 100 mg via ORAL
  Filled 2018-03-09 (×2): qty 1

## 2018-03-09 MED ORDER — VORTIOXETINE HBR 10 MG PO TABS
10.0000 mg | ORAL_TABLET | Freq: Every day | ORAL | Status: DC
  Administered 2018-03-09 – 2018-03-13 (×5): 10 mg via ORAL
  Filled 2018-03-09 (×2): qty 1
  Filled 2018-03-09: qty 7
  Filled 2018-03-09 (×4): qty 1

## 2018-03-09 MED ORDER — PRENATAL MULTIVITAMIN CH: 1.0000 | ORAL_TABLET | Freq: Every day | ORAL | Status: DC

## 2018-03-09 MED ORDER — PRENATAL MULTIVITAMIN CH
1.0000 | ORAL_TABLET | Freq: Two times a day (BID) | ORAL | Status: DC
  Administered 2018-03-09 (×2): 1 via ORAL
  Filled 2018-03-09 (×5): qty 1

## 2018-03-09 MED ORDER — PRAZOSIN HCL 5 MG PO CAPS
5.0000 mg | ORAL_CAPSULE | Freq: Every day | ORAL | Status: DC
  Administered 2018-03-09 – 2018-03-12 (×4): 5 mg via ORAL
  Filled 2018-03-09: qty 1
  Filled 2018-03-09: qty 7
  Filled 2018-03-09 (×4): qty 1

## 2018-03-09 NOTE — Progress Notes (Signed)
CSW following up with patient's report that she is on a CST waitlist with [facility]. CSW spoke with Eather Colas, intake coordinator, [phone]). [facility] states they have no record of the patient. CSW made a referral for CST services and faxed documentation. CSW will follow up on referral.  Enid Cutter, LCSW-A Clinical Social Worker

## 2018-03-09 NOTE — Progress Notes (Signed)
Nursing Progress Note: 7p-7a D: Pt currently presents with a pleasant/anxious/childlike affect and behavior. Interacting appropriately with the milieu. Pt reports good sleep during the previous night with current medication regimen. Pt did attend wrap-up group.  A: Pt provided with medications per providers orders. Pt's labs and vitals were monitored throughout the night. Pt supported emotionally and encouraged to express concerns and questions. Pt educated on medications.  R: Pt's safety ensured with 15 minute and environmental checks. Pt currently denies SI, HI, and AVH. Pt verbally contracts to seek staff if SI,HI, or AVH occurs and to consult with staff before acting on any harmful thoughts. Will continue to monitor.

## 2018-03-09 NOTE — Progress Notes (Signed)
Patient ID: [name], female   DOB: 2000-02-24, 18 y.o.   MRN: [id number]  Nursing Progress Note [id number]  Data: On initial approach, patient is seen sitting up in the dayroom. Patient presents blunted and cautious during interactions but is pleasant. Patient compliant with scheduled medications. Patient reporting nausea after receiving morning medications; NP notified and new orders recieved. Patient completed self-inventory sheet and rates depression, hopelessness, and anxiety 6,5,8 respectively. Patient rates their sleep and appetite as good/good respectively. Patient states goal for today is to "go to all groups". Patient is seen visible in the milieu. Patient currently denies SI/HI/AVH.   Action: Patient is educated about and provided medication per provider's orders. Patient safety maintained with q15 min safety checks and frequent rounding. Low fall risk precautions in place. Emotional support given. 1:1 interaction and active listening provided. Patient encouraged to attend meals, groups, and work on treatment plan and goals. Labs, vital signs and patient behavior monitored throughout shift.   Response: Patient remains safe on the unit at this time and agrees to come to staff with any issues/concerns. Patient is interacting with peers appropriately on the unit. Will continue to support and monitor.
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
## 2018-03-09 NOTE — Progress Notes (Signed)
[facility] Group Notes:  (Nursing/MHT/Case Management/Adjunct)  Date:  03/09/2018  Time:  2045  Type of Therapy:  wrap up group  Participation Level:  Active  Participation Quality:  Appropriate, Attentive, Sharing and Supportive  Affect:  Appropriate  Cognitive:  Appropriate  Insight:  Improving  Engagement in Group:  Engaged  Modes of Intervention:  Clarification, Education and Support  Summary of Progress/Problems: Pt was happy that she got a photograph of her son today. Pt reports a decrease in appetite since she has not been smoking marijuana. If pt could change any one thing in her life pt reports wanting to change her weight, weigh less. Pt is grateful for her family and friends.   Johann Capers S 03/09/2018, 10:02 PM

## 2018-03-10 LAB — HEMOGLOBIN A1C
Hgb A1c MFr Bld: 5.4 % (ref 4.8–5.6)
Mean Plasma Glucose: 108 mg/dL

## 2018-03-10 MED ORDER — MODAFINIL 200 MG PO TABS
200.0000 mg | ORAL_TABLET | Freq: Every day | ORAL | Status: DC
  Administered 2018-03-10 – 2018-03-13 (×4): 200 mg via ORAL
  Filled 2018-03-10 (×3): qty 1

## 2018-03-10 MED ORDER — BUSPIRONE HCL 15 MG PO TABS
15.0000 mg | ORAL_TABLET | Freq: Three times a day (TID) | ORAL | Status: DC
  Administered 2018-03-10 – 2018-03-13 (×11): 15 mg via ORAL
  Filled 2018-03-10: qty 1
  Filled 2018-03-10: qty 21
  Filled 2018-03-10: qty 1
  Filled 2018-03-10: qty 21
  Filled 2018-03-10 (×3): qty 1
  Filled 2018-03-10: qty 3
  Filled 2018-03-10 (×5): qty 1
  Filled 2018-03-10: qty 21
  Filled 2018-03-10 (×3): qty 1

## 2018-03-10 MED ORDER — MODAFINIL 200 MG PO TABS: 200.0000 mg | ORAL_TABLET | Freq: Every day | ORAL | Status: DC

## 2018-03-10 MED ORDER — PRENATAL MULTIVITAMIN CH
1.0000 | ORAL_TABLET | Freq: Every day | ORAL | Status: DC
  Administered 2018-03-10 – 2018-03-13 (×4): 1 via ORAL
  Filled 2018-03-10 (×4): qty 1
  Filled 2018-03-10: qty 7
  Filled 2018-03-10: qty 1

## 2018-03-10 NOTE — [facility] Suicide Risk Assessment (Signed)
[facility] INPATIENT:  Family/Significant Other Suicide Prevention Education  Suicide Prevention Education:  Education Completed; with grandmother, [name], [phone] has been identified by the patient as the family member/significant other with whom the patient will be residing, and identified as the person(s) who will aid the patient in the event of a mental health crisis (suicidal ideations/suicide attempt). With written consent from the patient, the family member/significant other has been provided the following suicide prevention education, prior to the and/or following the discharge of the patient.  The suicide prevention education provided includes the following:  Suicide risk factors  Suicide prevention and interventions  National Suicide Hotline telephone number  [facility] assessment telephone number  [facility] Emergency Assistance 911  [facility] and/or Residential Mobile Crisis Unit telephone number  Request made of family/significant other to:  Remove weapons (e.g., guns, rifles, knives), all items previously/currently identified as safety concern.    Remove drugs/medications (over-the-counter, prescriptions, illicit drugs), all items previously/currently identified as a safety concern.  The family member/significant other verbalizes understanding of the suicide prevention education information provided.  The family member/significant other agrees to remove the items of safety concern listed above.  Grandmother, [name], voices no safety concerns for the patient's return home and verbalizes understanding of suicide prevention education materials. Grandmother is currently watching the patient's son, she has no safety concerns regarding the patient harming the child. Grandmother is retired and is home all the time, the child stays with his father on weekends and will spend some additional time with the child next week to make an easier transition home for the  patient.   [name] 03/10/2018, 1:29 PM

## 2018-03-10 NOTE — Progress Notes (Signed)
D:Pt reports that she slept well last night and c/o anxiety. Pt says that she has passive si thoughts and she talked about how she was having homicidal towards her child prior to admission. She said that the thoughts scared her and she knew it was time to get help.  A:Offered support, encouragement and 15 minute checks. R:Pt contracts with staff for safety. Safety maintained on the unit.

## 2018-03-10 NOTE — Progress Notes (Signed)
[facility] MD Progress Note  03/10/2018 10:36 AM [name]  MRN:  [id number] Subjective:  Patient reports some improvement in her mood she is a little more animated she states she did not benefit yet from the modafinil augmentation she also is seeking medications for anxiety, denies wanting to harm self today contracts here does not feel she is ready to go home.  No involuntary movements no thoughts of harming self at present time but again not fully able to contract No psychosis no mania  Principal Problem: depression recurrent severe Diagnosis: Active Problems:   MDD (major depressive disorder), recurrent episode, severe (HCC)  Total Time spent with patient: 20 minutes   Past Medical History:  Past Medical History:  Diagnosis Date  . Anxiety   . Headache(784.0)   . Hx of suicide attempt   . Major depressive disorder   . PTSD (post-traumatic stress disorder)    History reviewed. No pertinent surgical history. Family History:  Family History  Problem Relation Age of Onset  . Diabetes Maternal Aunt   . Diabetes Maternal Grandmother   . Cancer Maternal Grandmother   . Breast cancer Other    Social History:  Social History   Substance and Sexual Activity  Alcohol Use Yes   Comment: socially     Social History   Substance and Sexual Activity  Drug Use Yes  . Types: Marijuana, MDMA (Ecstacy)    Social History   Socioeconomic History  . Marital status: Single    Spouse name: Not on file  . Number of children: Not on file  . Years of education: Not on file  . Highest education level: Not on file  Occupational History  . Not on file  Social Needs  . Financial resource strain: Not on file  . Food insecurity:    Worry: Not on file    Inability: Not on file  . Transportation needs:    Medical: Not on file    Non-medical: Not on file  Tobacco Use  . Smoking status: Current Every Day Smoker    Packs/day: 0.25    Types: Cigars  . Smokeless tobacco: Never Used  .  Tobacco comment: black and mild  Substance and Sexual Activity  . Alcohol use: Yes    Comment: socially  . Drug use: Yes    Types: Marijuana, MDMA (Ecstacy)  . Sexual activity: Yes    Partners: Male    Birth control/protection: None, Injection  Lifestyle  . Physical activity:    Days per week: Not on file    Minutes per session: Not on file  . Stress: Not on file  Relationships  . Social connections:    Talks on phone: Not on file    Gets together: Not on file    Attends religious service: Not on file    Active member of club or organization: Not on file    Attends meetings of clubs or organizations: Not on file    Relationship status: Not on file  Other Topics Concern  . Not on file  Social History Narrative  . Not on file   Additional Social History:    Pain Medications: see MAR Prescriptions: see MAR Over the Counter: see MAR History of alcohol / drug use?: Yes Longest period of sobriety (when/how long): 1 year Name of Substance 1: cocaine 1 - Age of First Use: 15 1 - Amount (size/oz): varies 1 - Frequency: 1 time in the past year 1 - Duration: 2 years 1 -  Last Use / Amount: 03/05/2018                  Sleep: Good  Appetite:  Good  Current Medications: Current Facility-Administered Medications  Medication Dose Route Frequency Provider Last Rate Last Dose  . busPIRone (BUSPAR) tablet 15 mg  15 mg Oral TID Malvin JohnsFarah, Lelar Farewell, MD   15 mg at 03/10/18 0852  . hydrOXYzine (ATARAX/VISTARIL) tablet 25 mg  25 mg Oral Q6H PRN Kerry HoughSimon, [name] E, PA-C   25 mg at 03/09/18 2201  . modafinil (PROVIGIL) tablet 200 mg  200 mg Oral Daily Nelly RoutKumar, Archana, MD   200 mg at 03/10/18 0859  . nicotine polacrilex (NICORETTE) gum 2 mg  2 mg Oral PRN Antonieta Pertlary, Greg Lawson, MD   2 mg at 03/09/18 1127  . ondansetron (ZOFRAN-ODT) disintegrating tablet 4 mg  4 mg Oral Q6H PRN Armandina StammerNwoko, Agnes I, NP   4 mg at 03/09/18 1015  . prazosin (MINIPRESS) capsule 5 mg  5 mg Oral QHS Malvin JohnsFarah, Yavuz Kirby, MD   5 mg at  03/09/18 2201  . prenatal multivitamin tablet 1 tablet  1 tablet Oral Daily Malvin JohnsFarah, Ollie Esty, MD   1 tablet at 03/10/18 [phone]  . traZODone (DESYREL) tablet 50 mg  50 mg Oral QHS,MR X 1 Kerry HoughSimon, [name] E, PA-C   50 mg at 03/09/18 2203  . vortioxetine HBr (TRINTELLIX) tablet 10 mg  10 mg Oral Daily Malvin JohnsFarah, Meher Kucinski, MD   10 mg at 03/10/18 [phone]    Lab Results:  Results for orders placed or performed during the hospital encounter of 03/08/18 (from the past 48 hour(s))  Pregnancy, urine     Status: None   Collection Time: 03/08/18  5:00 PM  Result Value Ref Range   Preg Test, Ur NEGATIVE NEGATIVE    Comment:        THE SENSITIVITY OF THIS METHODOLOGY IS >20 mIU/mL. Performed at [facility], [address] [place], [place] [address]   Urinalysis, Complete w Microscopic     Status: Abnormal   Collection Time: 03/08/18  5:00 PM  Result Value Ref Range   Color, Urine STRAW (A) YELLOW   APPearance CLEAR CLEAR   Specific Gravity, Urine 1.002 (L) 1.005 - 1.030   pH 6.0 5.0 - 8.0   Glucose, UA >=500 (A) NEGATIVE mg/dL   Hgb urine dipstick LARGE (A) NEGATIVE   Bilirubin Urine NEGATIVE NEGATIVE   Ketones, ur NEGATIVE NEGATIVE mg/dL   Protein, ur NEGATIVE NEGATIVE mg/dL   Nitrite NEGATIVE NEGATIVE   Leukocytes,Ua NEGATIVE NEGATIVE   RBC / HPF 0-5 0 - 5 RBC/hpf   WBC, UA 0-5 0 - 5 WBC/hpf   Bacteria, UA RARE (A) NONE SEEN   Squamous Epithelial / LPF 0-5 0 - 5   Mucus PRESENT     Comment: Performed at [facility], [address] [place], [place] [address]  Urine rapid drug screen (hosp performed)not at [facility]     Status: None   Collection Time: 03/08/18  5:00 PM  Result Value Ref Range   Opiates NONE DETECTED NONE DETECTED   Cocaine NONE DETECTED NONE DETECTED   Benzodiazepines NONE DETECTED NONE DETECTED   Amphetamines NONE DETECTED NONE DETECTED   Tetrahydrocannabinol NONE DETECTED NONE DETECTED   Barbiturates NONE DETECTED NONE DETECTED    Comment:  (NOTE) DRUG SCREEN FOR MEDICAL PURPOSES ONLY.  IF CONFIRMATION IS NEEDED FOR ANY PURPOSE, NOTIFY LAB WITHIN 5 DAYS. LOWEST DETECTABLE LIMITS FOR URINE DRUG SCREEN Drug  Class                     Cutoff (ng/mL) Amphetamine and metabolites    1000 Barbiturate and metabolites    200 Benzodiazepine                 200 Tricyclics and metabolites     300 Opiates and metabolites        300 Cocaine and metabolites        300 THC                            50 Performed at [facility], [address] [place], [place] [address]   CBC     Status: None   Collection Time: 03/08/18  6:24 PM  Result Value Ref Range   WBC 8.2 4.0 - 10.5 K/uL   RBC 4.41 3.87 - 5.11 MIL/uL   Hemoglobin 13.4 12.0 - 15.0 g/dL   HCT 19.1 47.8 - 29.5 %   MCV 93.9 80.0 - 100.0 fL   MCH 30.4 26.0 - 34.0 pg   MCHC 32.4 30.0 - 36.0 g/dL   RDW 62.1 30.8 - 65.7 %   Platelets 214 150 - 400 K/uL   nRBC 0.0 0.0 - 0.2 %    Comment: Performed at [facility], [address] [place], [place] [address]  Comprehensive metabolic panel     Status: Abnormal   Collection Time: 03/08/18  6:24 PM  Result Value Ref Range   Sodium 139 135 - 145 mmol/L   Potassium 3.5 3.5 - 5.1 mmol/L   Chloride 105 98 - 111 mmol/L   CO2 25 22 - 32 mmol/L   Glucose, Bld 232 (H) 70 - 99 mg/dL   BUN 8 6 - 20 mg/dL   Creatinine, Ser 2.95 0.44 - 1.00 mg/dL   Calcium 9.0 8.9 - 28.4 mg/dL   Total Protein 7.4 6.5 - 8.1 g/dL   Albumin 4.1 3.5 - 5.0 g/dL   AST 23 15 - 41 U/L   ALT 22 0 - 44 U/L   Alkaline Phosphatase 54 38 - 126 U/L   Total Bilirubin 0.4 0.3 - 1.2 mg/dL   GFR calc non Af Amer >60 >60 mL/min   GFR calc Af Amer >60 >60 mL/min   Anion gap 9 5 - 15    Comment: Performed at [facility], [address] [place], [place] [address]  Ethanol     Status: None   Collection Time: 03/08/18  6:24 PM  Result Value Ref Range   Alcohol, Ethyl (B) <10 <10 mg/dL    Comment: (NOTE) Lowest  detectable limit for serum alcohol is 10 mg/dL. For medical purposes only. Performed at [facility], [address] [place], [place] [address]   Hemoglobin A1c     Status: None   Collection Time: 03/08/18  6:24 PM  Result Value Ref Range   Hgb A1c MFr Bld 5.4 4.8 - 5.6 %    Comment: (NOTE)         Prediabetes: 5.7 - 6.4         Diabetes: >6.4         Glycemic control for adults with diabetes: <7.0    Mean Plasma Glucose 108 mg/dL    Comment: (NOTE) Performed At: [facility] [address] [place], [place] [id number] Jolene Schimke MD KV:[id number]   Lipid panel  Status: Abnormal   Collection Time: 03/08/18  6:24 PM  Result Value Ref Range   Cholesterol 213 (H) 0 - 200 mg/dL   Triglycerides 49 <162 mg/dL   HDL 54 >44 mg/dL   Total CHOL/HDL Ratio 3.9 RATIO   VLDL 10 0 - 40 mg/dL   LDL Cholesterol 695 (H) 0 - 99 mg/dL    Comment:        Total Cholesterol/HDL:CHD Risk Coronary Heart Disease Risk Table                     Men   Women  1/2 Average Risk   3.4   3.3  Average Risk       5.0   4.4  2 X Average Risk   9.6   7.1  3 X Average Risk  23.4   11.0        Use the calculated Patient Ratio above and the CHD Risk Table to determine the patient's CHD Risk.        ATP III CLASSIFICATION (LDL):  <100     mg/dL   Optimal  072-257  mg/dL   Near or Above                    Optimal  130-159  mg/dL   Borderline  505-183  mg/dL   High  >358     mg/dL   Very High Performed at [facility], [address] [place], [place] [address]   TSH     Status: Abnormal   Collection Time: 03/08/18  6:24 PM  Result Value Ref Range   TSH 0.148 (L) 0.350 - 4.500 uIU/mL    Comment: Performed by a 3rd Generation assay with a functional sensitivity of <=0.01 uIU/mL. Performed at [facility], [address] [place], [place] [address]     Blood Alcohol level:  Lab Results  Component Value Date   [facility] <10 03/08/2018    ETH <5 06/10/2016    Metabolic Disorder Labs: Lab Results  Component Value Date   HGBA1C 5.4 03/08/2018   MPG 108 03/08/2018   MPG 117 08/23/2017   Lab Results  Component Value Date   PROLACTIN 5.8 08/23/2017   PROLACTIN 32.8 02/07/2014   Lab Results  Component Value Date   CHOL 213 (H) 03/08/2018   TRIG 49 03/08/2018   HDL 54 03/08/2018   CHOLHDL 3.9 03/08/2018   VLDL 10 03/08/2018   LDLCALC 149 (H) 03/08/2018   LDLCALC 113 (H) 08/23/2017    Physical Findings: AIMS: Facial and Oral Movements Muscles of Facial Expression: None, normal Lips and Perioral Area: None, normal Jaw: None, normal Tongue: None, normal,Extremity Movements Upper (arms, wrists, hands, fingers): None, normal Lower (legs, knees, ankles, toes): None, normal, Trunk Movements Neck, shoulders, hips: None, normal, Overall Severity Severity of abnormal movements (highest score from questions above): None, normal Incapacitation due to abnormal movements: None, normal Patient's awareness of abnormal movements (rate only patient's report): No Awareness, Dental Status Current problems with teeth and/or dentures?: No Does patient usually wear dentures?: No  CIWA:    COWS:     Musculoskeletal: Strength & Muscle Tone: within normal limits Gait & Station: normal Patient leans: N/A  Psychiatric Specialty Exam: Physical Exam  ROS  Blood pressure 114/62, pulse (!) 127, temperature 98 F (36.7 C), temperature source Oral, resp. rate 18, height 5\' 8"  (1.727 m), weight 109.8 kg, SpO2 100 %, not currently breastfeeding.Body mass index  is 36.8 kg/m.  General Appearance: Casual  Eye Contact:  Absent at times  Speech:  Clear and Coherent  Volume:  Decreased  Mood:  Anxious and Depressed  Affect:  Appropriate  Thought Process:  Coherent  Orientation:  Full (Time, Place, and Person)  Thought Content:  Logical  Suicidal Thoughts:  No  Homicidal Thoughts:  No  Memory:  Immediate;   Good  Judgement:  Good   Insight:  Good  Psychomotor Activity:  Normal  Concentration:  Concentration: Good  Recall:  Good  Fund of Knowledge:  Good  Language:  Negative for issues  Akathisia:  Negative  Handed:  Right  AIMS (if indicated):     Assets:  Financial Resources/Insurance Housing  ADL's:  Intact  Cognition:  WNL  Sleep:  Number of Hours: 5.5     Treatment Plan Summary: Daily contact with patient to assess and evaluate symptoms and progress in treatment, Medication management and Plan Escalate modafinil continue cognitive therapy and current precautions probable discharge 1 to 2 days  Malvin Johns, MD 03/10/2018, 10:36 AM

## 2018-03-10 NOTE — Tx Team (Signed)
Interdisciplinary Treatment and Diagnostic Plan Update  03/10/2018 Time of Session: 9:00am [name] MRN: [id number]  Principal Diagnosis: <principal problem not specified>  Secondary Diagnoses: Active Problems:   MDD (major depressive disorder), recurrent episode, severe (HCC)   Current Medications:  Current Facility-Administered Medications  Medication Dose Route Frequency Provider Last Rate Last Dose  . busPIRone (BUSPAR) tablet 15 mg  15 mg Oral TID Malvin Johns, MD   15 mg at 03/10/18 0852  . hydrOXYzine (ATARAX/VISTARIL) tablet 25 mg  25 mg Oral Q6H PRN Kerry Hough, PA-C   25 mg at 03/09/18 2201  . modafinil (PROVIGIL) tablet 200 mg  200 mg Oral Daily Nelly Rout, MD   200 mg at 03/10/18 0859  . nicotine polacrilex (NICORETTE) gum 2 mg  2 mg Oral PRN Antonieta Pert, MD   2 mg at 03/09/18 1127  . ondansetron (ZOFRAN-ODT) disintegrating tablet 4 mg  4 mg Oral Q6H PRN Armandina Stammer I, NP   4 mg at 03/09/18 1015  . prazosin (MINIPRESS) capsule 5 mg  5 mg Oral QHS Malvin Johns, MD   5 mg at 03/09/18 2201  . prenatal multivitamin tablet 1 tablet  1 tablet Oral Daily Malvin Johns, MD   1 tablet at 03/10/18 [phone]  . traZODone (DESYREL) tablet 50 mg  50 mg Oral QHS,MR X 1 Kerry Hough, PA-C   50 mg at 03/09/18 2203  . vortioxetine HBr (TRINTELLIX) tablet 10 mg  10 mg Oral Daily Malvin Johns, MD   10 mg at 03/10/18 0600   PTA Medications: Medications Prior to Admission  Medication Sig Dispense Refill Last Dose  . albuterol (PROVENTIL HFA;VENTOLIN HFA) 108 (90 Base) MCG/ACT inhaler Inhale 1 puff into the lungs every 6 (six) hours as needed for wheezing or shortness of breath.   Taking  . cariprazine (VRAYLAR) capsule Take 3 mg by mouth daily.    Taking  . fluticasone (FLONASE) 50 MCG/ACT nasal spray Place 2 sprays into both nostrils daily. 16 g 1   . GNP NICOTINE POLACRILEX 2 MG gum Take 2 mg by mouth every hour as needed. Max 9 pieces per day     . medroxyPROGESTERone  (PROVERA) 10 MG tablet Take 10 mg by mouth daily. For 10 days     . prazosin (MINIPRESS) 2 MG capsule Take 2 mg by mouth at bedtime.   Taking  . pseudoephedrine (SUDAFED) 30 MG tablet Take 1 tablet (30 mg total) by mouth every 4 (four) hours as needed for congestion. 30 tablet 0   . valACYclovir (VALTREX) 500 MG tablet Take 1 tablet (500 mg total) by mouth 2 (two) times daily. (Patient taking differently: Take 500 mg by mouth as needed (outbreak). ) 6 tablet 3 Taking    Patient Stressors: Marital or family conflict Medication change or noncompliance Substance abuse  Patient Strengths: Ability for insight Average or above average intelligence Capable of independent living General fund of knowledge Motivation for treatment/growth  Treatment Modalities: Medication Management, Group therapy, Case management,  1 to 1 session with clinician, Psychoeducation, Recreational therapy.   Physician Treatment Plan for Primary Diagnosis: <principal problem not specified> Long Term Goal(s): Improvement in symptoms so as ready for discharge Improvement in symptoms so as ready for discharge   Short Term Goals: Ability to verbalize feelings will improve Ability to demonstrate self-control will improve Ability to identify and develop effective coping behaviors will improve Ability to maintain clinical measurements within normal limits will improve Ability to identify triggers associated with  substance abuse/mental health issues will improve  Medication Management: Evaluate patient's response, side effects, and tolerance of medication regimen.  Therapeutic Interventions: 1 to 1 sessions, Unit Group sessions and Medication administration.  Evaluation of Outcomes: Progressing  Physician Treatment Plan for Secondary Diagnosis: Active Problems:   MDD (major depressive disorder), recurrent episode, severe (HCC)  Long Term Goal(s): Improvement in symptoms so as ready for discharge Improvement in  symptoms so as ready for discharge   Short Term Goals: Ability to verbalize feelings will improve Ability to demonstrate self-control will improve Ability to identify and develop effective coping behaviors will improve Ability to maintain clinical measurements within normal limits will improve Ability to identify triggers associated with substance abuse/mental health issues will improve     Medication Management: Evaluate patient's response, side effects, and tolerance of medication regimen.  Therapeutic Interventions: 1 to 1 sessions, Unit Group sessions and Medication administration.  Evaluation of Outcomes: Progressing   RN Treatment Plan for Primary Diagnosis: <principal problem not specified> Long Term Goal(s): Knowledge of disease and therapeutic regimen to maintain health will improve  Short Term Goals: Ability to participate in decision making will improve, Ability to verbalize feelings will improve, Ability to disclose and discuss suicidal ideas and Ability to identify and develop effective coping behaviors will improve  Medication Management: RN will administer medications as ordered by provider, will assess and evaluate patient's response and provide education to patient for prescribed medication. RN will report any adverse and/or side effects to prescribing provider.  Therapeutic Interventions: 1 on 1 counseling sessions, Psychoeducation, Medication administration, Evaluate responses to treatment, Monitor vital signs and CBGs as ordered, Perform/monitor CIWA, COWS, AIMS and Fall Risk screenings as ordered, Perform wound care treatments as ordered.  Evaluation of Outcomes: Progressing   LCSW Treatment Plan for Primary Diagnosis: <principal problem not specified> Long Term Goal(s): Safe transition to appropriate next level of care at discharge, Engage patient in therapeutic group addressing interpersonal concerns.  Short Term Goals: Engage patient in aftercare planning with  referrals and resources, Increase social support, Increase emotional regulation, Identify triggers associated with mental health/substance abuse issues and Increase skills for wellness and recovery  Therapeutic Interventions: Assess for all discharge needs, 1 to 1 time with Social worker, Explore available resources and support systems, Assess for adequacy in community support network, Educate family and significant other(s) on suicide prevention, Complete Psychosocial Assessment, Interpersonal group therapy.  Evaluation of Outcomes: Progressing   Progress in Treatment: Attending groups: Yes. Participating in groups: Yes. Taking medication as prescribed: Yes. Toleration medication: Yes. Family/Significant other contact made: No, will contact:  Grandparents Patient understands diagnosis: Yes. Discussing patient identified problems/goals with staff: Yes. Medical problems stabilized or resolved: Yes. Denies suicidal/homicidal ideation: No. Issues/concerns per patient self-inventory: Yes.  New problem(s) identified: No, Describe:  CSW continuing to assess  New Short Term/Long Term Goal(s): medication management for mood stabilization; elimination of SI thoughts; development of comprehensive mental wellness/sobriety plan.  Patient Goals: Eliminate SI and reduce intrusive thoughts. Wants to start CST services.  Discharge Plan or Barriers: Follow up with outpatient providers and start CST services if accepted. Return home with grandparents.  Reason for Continuation of Hospitalization: Anxiety Depression Suicidal ideation  Estimated Length of Stay: 3-5 days  Attendees: Patient: 03/10/2018 10:24 AM  Physician:  03/10/2018 10:24 AM  Nursing:  03/10/2018 10:24 AM  RN Care Manager: 03/10/2018 10:24 AM  Social Worker: Enid Cutter, LCSWA 03/10/2018 10:24 AM  Recreational Therapist:  03/10/2018 10:24 AM  Other:  03/10/2018 10:24  AM  Other:  03/10/2018 10:24 AM  Other: 03/10/2018 10:24 AM     Scribe for Treatment Team: Darreld Mcleanharlotte C Nandan Willems, LCSWA 03/10/2018 10:24 AM

## 2018-03-10 NOTE — Progress Notes (Signed)
Recreation Therapy Notes  Date:  2.14.19 Time: 0930 Location: [address] Dayroom  Group Topic: Stress Management  Goal Area(s) Addresses:  Patient will identify positive stress management techniques. Patient will identify benefits of using stress management post d/c.  Behavioral Response:  Engaged  Intervention: Stress Management  Activity :  Meditation.  LRT introduced the stress management technique of meditation.  LRT played a meditation on having a purposeful day.  Patients were to listen and follow along as meditation played.  Education:  Stress Management, Discharge Planning.   Education Outcome: Acknowledges Education  Clinical Observations/Feedback: Pt attended and participated in group.     Caroll Rancher, LRT/CTRS     Caroll Rancher A 03/10/2018 12:09 PM

## 2018-03-10 NOTE — [facility] Group Notes (Signed)
[facility] LCSW Group Therapy Note  Date/Time  Type of Therapy/Topic:  Group Therapy:  Feelings about Diagnosis  Participation Level:  Active   Mood: Depressed   Description of Group:    This group will allow patients to explore their thoughts and feelings about diagnoses they have received. Patients will be guided to explore their level of understanding and acceptance of these diagnoses. Facilitator will encourage patients to process their thoughts and feelings about the reactions of others to their diagnosis, and will guide patients in identifying ways to discuss their diagnosis with significant others in their lives. This group will be process-oriented, with patients participating in exploration of their own experiences as well as giving and receiving support and challenge from other group members.   Therapeutic Goals: 1. Patient will demonstrate understanding of diagnosis as evidence by identifying two or more symptoms of the disorder:  2. Patient will be able to express two feelings regarding the diagnosis 3. Patient will demonstrate ability to communicate their needs through discussion and/or role plays  Summary of Patient Progress: [name] attended the session. She actively participated in the discussion. [name] believes the social stigma was in her family attitudes toward mental health. Her mother told her therapist that Black people do not have mental health issues.  One positive idea [name] will take from the group is to hold on to her dreams.    Therapeutic Modalities:   Cognitive Behavioral Therapy Brief Therapy Feelings Identification   Marian Sorrow, MSW Intern CSW Department 03/10/2018 8:03 AM

## 2018-03-10 NOTE — Progress Notes (Signed)
Patient did attend the evening speaker AA meeting.  

## 2018-03-10 NOTE — Progress Notes (Addendum)
CSW contacted patient's employer, [name] [employer], at patient's request. Informed employer that the patient is currently hospitalized and will be provided a work note at discharge.   CSW following up on referral for CST with [facility]. Left a voicemail for Delores, requested a returned call.  Enid Cutter, LCSW-A Clinical Social Worker

## 2018-03-11 DIAGNOSIS — F4310 Post-traumatic stress disorder, unspecified: Secondary | ICD-10-CM

## 2018-03-11 NOTE — Progress Notes (Signed)
[facility] MD Progress Note  03/11/2018 10:59 AM [name]  MRN:  [id number]   Subjective:  18 yo admitted with suicidal ideations after skipping her medications for three days.  Medications were restarted and she feels stable.  Realizes there is not any benefit to not taking her medications.  Medications decrease her depression and increases when not using, denies depression.  Focused on discharge.  Minimizes her depression and issues.  Her 2 yo son will stay with the father's parents for two months so she can get herself together.  Appears to be at her baseline, focused on future goals with employment.    Principal Problem: PTSD (post-traumatic stress disorder) Diagnosis: Principal Problem:   PTSD (post-traumatic stress disorder) Active Problems:   Generalized anxiety disorder  Total Time spent with patient: 30 minutes  Past Psychiatric History: depression, anxiety, substance abuse  Past Medical History:  Past Medical History:  Diagnosis Date  . Anxiety   . Headache(784.0)   . Hx of suicide attempt   . Major depressive disorder   . PTSD (post-traumatic stress disorder)    History reviewed. No pertinent surgical history. Family History:  Family History  Problem Relation Age of Onset  . Diabetes Maternal Aunt   . Diabetes Maternal Grandmother   . Cancer Maternal Grandmother   . Breast cancer Other    Family Psychiatric  History: none Social History:  Social History   Substance and Sexual Activity  Alcohol Use Yes   Comment: socially     Social History   Substance and Sexual Activity  Drug Use Yes  . Types: Marijuana, MDMA (Ecstacy)    Social History   Socioeconomic History  . Marital status: Single    Spouse name: Not on file  . Number of children: Not on file  . Years of education: Not on file  . Highest education level: Not on file  Occupational History  . Not on file  Social Needs  . Financial resource strain: Not on file  . Food insecurity:    Worry: Not on  file    Inability: Not on file  . Transportation needs:    Medical: Not on file    Non-medical: Not on file  Tobacco Use  . Smoking status: Current Every Day Smoker    Packs/day: 0.25    Types: Cigars  . Smokeless tobacco: Never Used  . Tobacco comment: black and mild  Substance and Sexual Activity  . Alcohol use: Yes    Comment: socially  . Drug use: Yes    Types: Marijuana, MDMA (Ecstacy)  . Sexual activity: Yes    Partners: Male    Birth control/protection: None, Injection  Lifestyle  . Physical activity:    Days per week: Not on file    Minutes per session: Not on file  . Stress: Not on file  Relationships  . Social connections:    Talks on phone: Not on file    Gets together: Not on file    Attends religious service: Not on file    Active member of club or organization: Not on file    Attends meetings of clubs or organizations: Not on file    Relationship status: Not on file  Other Topics Concern  . Not on file  Social History Narrative  . Not on file   Additional Social History:    Pain Medications: see MAR Prescriptions: see MAR Over the Counter: see MAR History of alcohol / drug use?: Yes Longest period of sobriety (  when/how long): 1 year Name of Substance 1: cocaine 1 - Age of First Use: 15 1 - Amount (size/oz): varies 1 - Frequency: 1 time in the past year 1 - Duration: 2 years 1 - Last Use / Amount: 03/05/2018      Sleep: Good  Appetite:  Good  Current Medications: Current Facility-Administered Medications  Medication Dose Route Frequency Provider Last Rate Last Dose  . busPIRone (BUSPAR) tablet 15 mg  15 mg Oral TID Malvin Johns, MD   15 mg at 03/11/18 0801  . hydrOXYzine (ATARAX/VISTARIL) tablet 25 mg  25 mg Oral Q6H PRN Kerry Hough, PA-C   25 mg at 03/10/18 2236  . modafinil (PROVIGIL) tablet 200 mg  200 mg Oral Daily Nelly Rout, MD   200 mg at 03/11/18 0801  . nicotine polacrilex (NICORETTE) gum 2 mg  2 mg Oral PRN Antonieta Pert, MD   2 mg at 03/09/18 1127  . ondansetron (ZOFRAN-ODT) disintegrating tablet 4 mg  4 mg Oral Q6H PRN Armandina Stammer I, NP   4 mg at 03/09/18 1015  . prazosin (MINIPRESS) capsule 5 mg  5 mg Oral QHS Malvin Johns, MD   5 mg at 03/10/18 2236  . prenatal multivitamin tablet 1 tablet  1 tablet Oral Daily Malvin Johns, MD   1 tablet at 03/11/18 0802  . traZODone (DESYREL) tablet 50 mg  50 mg Oral QHS,MR X 1 Kerry Hough, PA-C   50 mg at 03/10/18 2236  . vortioxetine HBr (TRINTELLIX) tablet 10 mg  10 mg Oral Daily Malvin Johns, MD   10 mg at 03/11/18 0801    Lab Results: No results found for this or any previous visit (from the past 48 hour(s)).  Blood Alcohol level:  Lab Results  Component Value Date   ETH <10 03/08/2018   ETH <5 06/10/2016    Metabolic Disorder Labs: Lab Results  Component Value Date   HGBA1C 5.4 03/08/2018   MPG 108 03/08/2018   MPG 117 08/23/2017   Lab Results  Component Value Date   PROLACTIN 5.8 08/23/2017   PROLACTIN 32.8 02/07/2014   Lab Results  Component Value Date   CHOL 213 (H) 03/08/2018   TRIG 49 03/08/2018   HDL 54 03/08/2018   CHOLHDL 3.9 03/08/2018   VLDL 10 03/08/2018   LDLCALC 149 (H) 03/08/2018   LDLCALC 113 (H) 08/23/2017    Physical Findings: AIMS: Facial and Oral Movements Muscles of Facial Expression: None, normal Lips and Perioral Area: None, normal Jaw: None, normal Tongue: None, normal,Extremity Movements Upper (arms, wrists, hands, fingers): None, normal Lower (legs, knees, ankles, toes): None, normal, Trunk Movements Neck, shoulders, hips: None, normal, Overall Severity Severity of abnormal movements (highest score from questions above): None, normal Incapacitation due to abnormal movements: None, normal Patient's awareness of abnormal movements (rate only patient's report): No Awareness, Dental Status Current problems with teeth and/or dentures?: No Does patient usually wear dentures?: No  CIWA:    COWS:      Musculoskeletal: Strength & Muscle Tone: within normal limits Gait & Station: normal Patient leans: N/A  Psychiatric Specialty Exam: Physical Exam  Nursing note and vitals reviewed. Constitutional: She is oriented to person, place, and time. She appears well-developed and well-nourished.  HENT:  Head: Normocephalic.  Neck: Normal range of motion.  Respiratory: Effort normal.  Musculoskeletal: Normal range of motion.  Neurological: She is alert and oriented to person, place, and time.  Psychiatric: Her speech is normal and behavior is  normal. Judgment and thought content normal. Her mood appears anxious. Cognition and memory are normal.    Review of Systems  Psychiatric/Behavioral: Positive for substance abuse. The patient is nervous/anxious.   All other systems reviewed and are negative.   Blood pressure (!) 89/54, pulse (!) 136, temperature 98 F (36.7 C), temperature source Oral, resp. rate 20, height 5\' 8"  (1.727 m), weight 109.8 kg, SpO2 100 %, not currently breastfeeding.Body mass index is 36.8 kg/m.  General Appearance: Casual  Eye Contact:  Good  Speech:  Normal Rate  Volume:  Normal  Mood:  Anxious  Affect:  Congruent  Thought Process:  Coherent and Descriptions of Associations: Intact  Orientation:  Full (Time, Place, and Person)  Thought Content:  WDL and Logical  Suicidal Thoughts:  No  Homicidal Thoughts:  No  Memory:  Immediate;   Good Recent;   Good Remote;   Good  Judgement:  Fair  Insight:  Fair  Psychomotor Activity:  Normal  Concentration:  Concentration: Fair and Attention Span: Fair  Recall:  Good  Fund of Knowledge:  Fair  Language:  Good  Akathisia:  No  Handed:  Right  AIMS (if indicated):     Assets:  Leisure Time Physical Health Resilience Social Support  ADL's:  Intact  Cognition:  WNL  Sleep:  Number of Hours: 6.25    Treatment Plan Summary: Daily contact with patient to assess and evaluate symptoms and progress in treatment,  Medication management and Plan: Major depressive disorder, recurrent, severe without psychosis: -Continued Trintellix 10 mg daily  Insomnia: -Continued Trazodone 50 mg at bedtime  Anxiety: -Continued Buspar 15 mg TID PRN anxiety -Continued hydroxyzine 25 mg every six hours PRN  Safety: Will continue 15 minute observation for safety checks. Patient is able to contract for safety on the unit at this time  Labs: Chem WDL except total protein of 8.2H, CBC WDL, negative for acetaminophen and salicylate and alcohol and drugs.  Continue to develop treatment plan to decrease risk of relapse upon discharge and to reduce the need for readmission.  Psycho-social education regarding relapse prevention and self care.  Health care follow up as needed for medical problems.  Continue to attend and participate in therapy.   Nanine MeansLORD, Tarrance Januszewski, NP 03/11/2018, 10:59 AM

## 2018-03-11 NOTE — [facility] Group Notes (Signed)
[facility] Group Notes:  (Nursing)  Date:  03/11/2018  Time:  1430 Type of Therapy:  Nurse Education  Participation Level:  Active  Participation Quality:  Appropriate  Affect:  Appropriate  Cognitive:  Appropriate  Insight:  Appropriate  Engagement in Group:  Engaged  Modes of Intervention:  Discussion  Summary of Progress/Problems: Identifying Needs  Shela Nevin 03/11/2018, 4:36 PM

## 2018-03-11 NOTE — Plan of Care (Addendum)
  Problem: Activity: Goal: Interest or engagement in activities will improve Outcome: Progressing   Problem: Coping: Goal: Ability to verbalize frustrations and anger appropriately will improve Outcome: Progressing  D: Pt alert and oriented on the unit. Pt engaging with RN staff and other pts. Pt denies HI, A/VH, and endorses passive SI. Pt rated her depression a 4, anxiety a 0, and feelings of hopelessness a 5, with 10 being the worst. Pt's goal for today is "finding out about discharge." Pt also participated during unit groups and activities and was pleasant and cooperative. A: Education, support and encouragement provided, q15 minute safety checks remain in effect. Medications administered per MD orders. R: No reactions/side effects to medicine noted. Pt denies any concerns at this time, and verbally contracts for safety. Pt ambulating on the unit with no issues. Pt remains safe on and off the unit.

## 2018-03-11 NOTE — Progress Notes (Signed)
Patient attended AA group meeting.  
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
## 2018-03-11 NOTE — Progress Notes (Signed)
Pt in dayroom interacting with staff and patients.  Pt is childlike during interaction, smiles when talking even if the subject is not positive.  Pt sts she is d/c tomorrow.  Pt sts she still has SI but they are passive with no plan.  Pt denies HI and AVH and verbally contracts for safety.  Pt denies pain or discomfort and sts she does have anxiety scored at about 5-6. Pt given meds and PRN meds per MD order.  Pt given support and encouragement. Pt remains safe on unit.

## 2018-03-11 NOTE — Progress Notes (Signed)
Pt in dayroom interacting with other patients.  Pt does attend group therapy.   Pt denies SI, HI and AVH.  Pt verbally contracts for safety and denies other pain or discomfort.  Pt is med compliant.   Pt request prn sleep med and scheduled meds.. Pt given support and encouragement. Pt in room sleeping.  Pt remains safe on unit.

## 2018-03-12 NOTE — Progress Notes (Signed)
Patient ID: [name], female   DOB: [birth year], 18 y.o.   MRN: [id number]  [facility] MD Progress Note  03/12/2018 9:56 AM [name]  MRN:  [id number]   Subjective:  18 yo admitted with suicidal ideations after skipping her medications for three days.  Patient reports today "I am feeling good and I am ready to go home."  On evaluation today Patient is alert and oriented x 3.  Patient is smiling and is pleasant.  Reports she slept well last night, but she also reports she has not had much of an appetite but she still eats. She endorses nausea x 1 today, but states it was due to nervousness she experienced participating in group activities.   Patient reports chronic passive suicide ideation without a plan.  States she has thoughts of what the world would be like absent of her, but does not have a plan to hurt herself.  She verbalized having a reason to live.  Denies homicidal ideation, and does not appear to be responding to any internal stimuli.  She reports her depression 3/10 (where 10/10 is severe depression) and anxiety 6/10 using the same scale.  She was able to qualify her anxiety level as being associated to her wanting to be discharged today.  She is actively working towards her discharge and was able to communicate coping skills she can utilize when faced with stressful situations after discharge.   Principal Problem: PTSD (post-traumatic stress disorder) Diagnosis: Principal Problem:   PTSD (post-traumatic stress disorder) Active Problems:   Generalized anxiety disorder  Total Time spent with patient: 30 minutes  Past Psychiatric History: depression, anxiety, substance abuse  Past Medical History:  Past Medical History:  Diagnosis Date  . Anxiety   . Headache(784.0)   . Hx of suicide attempt   . Major depressive disorder   . PTSD (post-traumatic stress disorder)    History reviewed. No pertinent surgical history. Family History:  Family History  Problem Relation Age of Onset  .  Diabetes Maternal Aunt   . Diabetes Maternal Grandmother   . Cancer Maternal Grandmother   . Breast cancer Other    Family Psychiatric  History: none Social History:  Social History   Substance and Sexual Activity  Alcohol Use Yes   Comment: socially     Social History   Substance and Sexual Activity  Drug Use Yes  . Types: Marijuana, MDMA (Ecstacy)    Social History   Socioeconomic History  . Marital status: Single    Spouse name: Not on file  . Number of children: Not on file  . Years of education: Not on file  . Highest education level: Not on file  Occupational History  . Not on file  Social Needs  . Financial resource strain: Not on file  . Food insecurity:    Worry: Not on file    Inability: Not on file  . Transportation needs:    Medical: Not on file    Non-medical: Not on file  Tobacco Use  . Smoking status: Current Every Day Smoker    Packs/day: 0.25    Types: Cigars  . Smokeless tobacco: Never Used  . Tobacco comment: black and mild  Substance and Sexual Activity  . Alcohol use: Yes    Comment: socially  . Drug use: Yes    Types: Marijuana, MDMA (Ecstacy)  . Sexual activity: Yes    Partners: Male    Birth control/protection: None, Injection  Lifestyle  . Physical activity:  Days per week: Not on file    Minutes per session: Not on file  . Stress: Not on file  Relationships  . Social connections:    Talks on phone: Not on file    Gets together: Not on file    Attends religious service: Not on file    Active member of club or organization: Not on file    Attends meetings of clubs or organizations: Not on file    Relationship status: Not on file  Other Topics Concern  . Not on file  Social History Narrative  . Not on file   Additional Social History:    Pain Medications: see MAR Prescriptions: see MAR Over the Counter: see MAR History of alcohol / drug use?: Yes Longest period of sobriety (when/how long): 1 year Name of Substance 1:  cocaine 1 - Age of First Use: 18 1 - Amount (size/oz): varies 1 - Frequency: 1 time in the past year 1 - Duration: 2 years 1 - Last Use / Amount: 03/05/2018      Sleep: Good  Appetite:  Good  Current Medications: Current Facility-Administered Medications  Medication Dose Route Frequency Provider Last Rate Last Dose  . busPIRone (BUSPAR) tablet 15 mg  15 mg Oral TID Malvin JohnsFarah, Brian, MD   15 mg at 03/12/18 [id number]  . hydrOXYzine (ATARAX/VISTARIL) tablet 25 mg  25 mg Oral Q6H PRN Kerry HoughSimon, [name] E, PA-C   25 mg at 03/11/18 2202  . modafinil (PROVIGIL) tablet 200 mg  200 mg Oral Daily Nelly RoutKumar, Archana, MD   200 mg at 03/12/18 [id number]  . nicotine polacrilex (NICORETTE) gum 2 mg  2 mg Oral PRN Antonieta Pertlary, Greg Lawson, MD   2 mg at 03/11/18 1200  . ondansetron (ZOFRAN-ODT) disintegrating tablet 4 mg  4 mg Oral Q6H PRN Armandina StammerNwoko, Agnes I, NP   4 mg at 03/09/18 1015  . prazosin (MINIPRESS) capsule 5 mg  5 mg Oral QHS Malvin JohnsFarah, Brian, MD   5 mg at 03/11/18 2202  . prenatal multivitamin tablet 1 tablet  1 tablet Oral Daily Malvin JohnsFarah, Brian, MD   1 tablet at 03/12/18 [phone]  . traZODone (DESYREL) tablet 50 mg  50 mg Oral QHS,MR X 1 Kerry HoughSimon, [name] E, PA-C   50 mg at 03/11/18 2203  . vortioxetine HBr (TRINTELLIX) tablet 10 mg  10 mg Oral Daily Malvin JohnsFarah, Brian, MD   10 mg at 03/12/18 [id number]    Lab Results: No results found for this or any previous visit (from the past 48 hour(s)).  Blood Alcohol level:  Lab Results  Component Value Date   ETH <10 03/08/2018   ETH <5 06/10/2016    Metabolic Disorder Labs: Lab Results  Component Value Date   HGBA1C 5.4 03/08/2018   MPG 108 03/08/2018   MPG 117 08/23/2017   Lab Results  Component Value Date   PROLACTIN 5.8 08/23/2017   PROLACTIN 32.8 02/07/2014   Lab Results  Component Value Date   CHOL 213 (H) 03/08/2018   TRIG 49 03/08/2018   HDL 54 03/08/2018   CHOLHDL 3.9 03/08/2018   VLDL 10 03/08/2018   LDLCALC 149 (H) 03/08/2018   LDLCALC 113 (H) 08/23/2017    Physical  Findings: AIMS: Facial and Oral Movements Muscles of Facial Expression: None, normal Lips and Perioral Area: None, normal Jaw: None, normal Tongue: None, normal,Extremity Movements Upper (arms, wrists, hands, fingers): None, normal Lower (legs, knees, ankles, toes): None, normal, Trunk Movements Neck, shoulders, hips: None, normal, Overall Severity Severity of abnormal  movements (highest score from questions above): None, normal Incapacitation due to abnormal movements: None, normal Patient's awareness of abnormal movements (rate only patient's report): No Awareness, Dental Status Current problems with teeth and/or dentures?: No Does patient usually wear dentures?: No  CIWA:    COWS:     Musculoskeletal: Strength & Muscle Tone: within normal limits Gait & Station: normal Patient leans: N/A  Psychiatric Specialty Exam: Physical Exam  Nursing note and vitals reviewed. Constitutional: She is oriented to person, place, and time. She appears well-developed and well-nourished.  HENT:  Head: Normocephalic.  Neck: Normal range of motion.  Respiratory: Effort normal.  Musculoskeletal: Normal range of motion.  Neurological: She is alert and oriented to person, place, and time.  Psychiatric: Her speech is normal and behavior is normal. Judgment and thought content normal. Her mood appears anxious. Cognition and memory are normal.    Review of Systems  Psychiatric/Behavioral: Positive for substance abuse. The patient is nervous/anxious.   All other systems reviewed and are negative.   Blood pressure (!) 122/94, pulse (!) 122, temperature 98.2 F (36.8 C), temperature source Oral, resp. rate 16, height 5\' 8"  (1.727 m), weight 109.8 kg, SpO2 100 %, not currently breastfeeding.Body mass index is 36.8 kg/m.  General Appearance: Casual  Eye Contact:  Good  Speech:  Normal Rate  Volume:  Normal  Mood:  Anxious  Affect:  Congruent  Thought Process:  Coherent and Descriptions of  Associations: Intact  Orientation:  Full (Time, Place, and Person)  Thought Content:  WDL and Logical  Suicidal Thoughts:  No  Homicidal Thoughts:  No  Memory:  Immediate;   Good Recent;   Good Remote;   Good  Judgement:  Fair  Insight:  Fair  Psychomotor Activity:  Normal  Concentration:  Concentration: Fair and Attention Span: Fair  Recall:  Good  Fund of Knowledge:  Fair  Language:  Good  Akathisia:  No  Handed:  Right  AIMS (if indicated):     Assets:  Leisure Time Physical Health Resilience Social Support  ADL's:  Intact  Cognition:  WNL  Sleep:  Number of Hours: 6.75    Treatment Plan Summary: Daily contact with patient to assess and evaluate symptoms and progress in treatment, Medication management and Plan: Major depressive disorder, recurrent, severe without psychosis: -Continued Trintellix 10 mg daily  Insomnia: -Continued Trazodone 50 mg at bedtime  Anxiety: -Continued Buspar 15 mg TID PRN anxiety -Continued hydroxyzine 25 mg every six hours PRN  Safety: Will continue 15 minute observation for safety checks. Patient is able to contract for safety on the unit at this time  Labs: Chem WDL except total protein of 8.2H, CBC WDL, negative for acetaminophen and salicylate and alcohol and drugs.  Continue to develop treatment plan to decrease risk of relapse upon discharge and to reduce the need for readmission.  Psycho-social education regarding relapse prevention and self care.  Health care follow up as needed for medical problems.  Continue to attend and participate in therapy.   Discharge possibly tomorrow  Nanine Means, NP 03/12/2018, 9:56 AM

## 2018-03-12 NOTE — Progress Notes (Signed)
Pt presents with an anxious mood. Pt appeared happy on approach and smiled throughout the assessment. Pt verbalized that she was excited about discharging home today. Pt reported decreased depression and ongoing anxiety "not as bad." However, pt did endorse passive SI with no plan or intent. Pt verbally contracts for safety. Pt denies HI. Pt denies AVH. Pt expressed that she will be returning back home to her grandparents house after discharge. Pt expressed that her son will continue to stay with his dad for a couple of months to allow her time to get adjusted. Pt compliant with taking meds and denies any side effects to medications.  Medications reviewed with pt. Verbal support provided. Pt encouraged to attend groups. 15 minute checks performed for safety.  Pt compliant with tx plan.

## 2018-03-12 NOTE — [facility] Group Notes (Signed)
[facility] LCSW Group Therapy Note  03/12/2018  10:00-11:00AM  Type of Therapy and Topic:  Group Therapy:  Adding Supports Including Being Your Own Support  Participation Level:  Active   Description of Group:  Patients in this group were introduced to the concept that additional supports including self-support are an essential part of recovery.  A song entitled "I Need Help!" was played and a group discussion was held in reaction to the idea of needing to add supports.  A song entitled "My Own Hero" was played and a group discussion ensued in which patients stated they could relate to the song and it inspired them to realize they have be willing to help themselves in order to succeed, because other people cannot achieve sobriety or stability for them.  We discussed adding a variety of healthy supports to address the various needs in their lives.  A song was played called "I Know Where I've Been" toward the end of group and used to conduct an inspirational wrap-up to group of remembering how far they have already come in their journey.  Therapeutic Goals: 1)  demonstrate the importance of being a part of one's own support system 2)  discuss reasons people in one's life may eventually be unable to be continually supportive  3)  identify the patient's current support system and   4)  elicit commitments to add healthy supports and to become more conscious of being self-supportive   Summary of Patient Progress:  The patient expressed that her grandparents, a Dance movement psychotherapist, her therapist, and her new peer support specialist are her healthy supports.  Her parents are "on the line" and can at times be healthy for her, but often are not.  She also indicated that female "friends" are often not healthy for her.  She stated her grandmother will take her to appointments, cuddles with her, and she can talk about anything with her.  Additionally, her grandparents allow her to live with them because they see that living with her  parents is not healthy.  She believes finding a job she likes and enjoys going to can be an additional level of support that would be helpful.   Therapeutic Modalities:   Motivational Interviewing Activity  Lynnell Chad

## 2018-03-12 NOTE — Progress Notes (Signed)
Pt attended AA tonight. Pt presents with animated/anxious affect and mood. Pt denies HI/AVH/Pain at this time. Endorses passive SI but verbal contracts for safety. Pt states she hopes to go home tomorrow. No new c/o's. Support offered. Will continue with POC.

## 2018-03-12 NOTE — Progress Notes (Signed)
Patient did attend the evening speaker AA meeting.  

## 2018-03-13 MED ORDER — MEDROXYPROGESTERONE ACETATE 10 MG PO TABS: 10.0000 mg | ORAL_TABLET | Freq: Every day | ORAL | 0 refills | Status: DC

## 2018-03-13 MED ORDER — BUSPIRONE HCL 15 MG PO TABS: 15.0000 mg | ORAL_TABLET | Freq: Three times a day (TID) | ORAL | 0 refills | Status: DC

## 2018-03-13 MED ORDER — MODAFINIL 200 MG PO TABS: 200.0000 mg | ORAL_TABLET | Freq: Every day | ORAL | 0 refills | Status: DC

## 2018-03-13 MED ORDER — NICOTINE POLACRILEX 2 MG MT GUM: 2.0000 mg | CHEWING_GUM | OROMUCOSAL | 0 refills | Status: DC | PRN

## 2018-03-13 MED ORDER — HYDROXYZINE HCL 25 MG PO TABS: 25.0000 mg | ORAL_TABLET | Freq: Four times a day (QID) | ORAL | 0 refills | Status: DC | PRN

## 2018-03-13 MED ORDER — TRAZODONE HCL 50 MG PO TABS
50.0000 mg | ORAL_TABLET | Freq: Every evening | ORAL | Status: DC | PRN
  Filled 2018-03-13: qty 7

## 2018-03-13 MED ORDER — ALBUTEROL SULFATE HFA 108 (90 BASE) MCG/ACT IN AERS: 1.0000 | INHALATION_SPRAY | Freq: Four times a day (QID) | RESPIRATORY_TRACT | Status: DC | PRN

## 2018-03-13 MED ORDER — TRAZODONE HCL 50 MG PO TABS: 50.0000 mg | ORAL_TABLET | Freq: Every evening | ORAL | 0 refills | Status: DC | PRN

## 2018-03-13 MED ORDER — PRENATAL MULTIVITAMIN CH: 1.0000 | ORAL_TABLET | Freq: Every day | ORAL | Status: DC

## 2018-03-13 MED ORDER — PRAZOSIN HCL 5 MG PO CAPS: 5.0000 mg | ORAL_CAPSULE | Freq: Every day | ORAL | 0 refills | Status: DC

## 2018-03-13 MED ORDER — VORTIOXETINE HBR 10 MG PO TABS: 10.0000 mg | ORAL_TABLET | Freq: Every day | ORAL | 0 refills | Status: DC

## 2018-03-13 MED ORDER — FLUTICASONE PROPIONATE 50 MCG/ACT NA SUSP: 2.0000 | Freq: Every day | NASAL | 1 refills | Status: DC

## 2018-03-13 NOTE — Progress Notes (Signed)
Discharge note: Patient reviewed discharge paperwork with RN including prescriptions, follow up appointments, and lab work. Patient given the opportunity to ask questions. All concerns were addressed. All belongings were returned to patient. Denied SI/HI/AVH. Patient thanked staff for their care while at the hospital.  Patient was discharged to lobby where her grandparents were waiting to pick her up.
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
## 2018-03-13 NOTE — Plan of Care (Addendum)
Patient self inventory- Patient slept well last night, appetite is fair, concentration good. Depression, hopelessness, and anxiety rated 2, 0, 5 out of 10. Denies SI HI AVH. Denies physical pain. Patient's goal is "going home."  Patient is compliant with medications prescribed per provider. No side effects noted. Safety is maintained with 15 minute checks as well as environmental checks. Will continue to monitor and provide support.  Problem: Education: Goal: Knowledge of [facility] Education information/materials will improve Outcome: Adequate for Discharge   Problem: Education: Goal: Emotional status will improve Outcome: Adequate for Discharge   Problem: Education: Goal: Mental status will improve Outcome: Adequate for Discharge   Problem: Education: Goal: Verbalization of understanding the information provided will improve Outcome: Adequate for Discharge

## 2018-03-13 NOTE — Progress Notes (Signed)
Pt attended spiritual care group on grief and loss facilitated by chaplain Burnis Kingfisher and Counseling intern Ethel Rana  Group opened with brief discussion and psycho-social ed around grief and loss in relationships and in relation to self - identifying life patterns, circumstances, changes that cause losses. Established group norm of speaking from own life experience. Group goal of establishing open and affirming space for members to share loss and experience with grief, normalize grief experience and provide psycho social education and grief support.    [name] was present throughout group.  Engaged in facilitated discussion voluntarily.  Noted that she grieves loss of childhood and is grieving who she is / working to find herself.  Spoke of grieving that she did not have the relationship with her parents that she hoped.  Spoke of catching herself in binary thinking and finds it helpful to to remind herself that things aren't all good/bad but there is a lot of in between.  Spoke of offering herself acceptance for journey with mental health - specifically having to leave her son with his dad for 2 months.  Received affirmation from group for this.   Burnis Kingfisher, MDiv, [facility]

## 2018-03-13 NOTE — Progress Notes (Signed)
Recreation Therapy Notes  Date:  2.17.20 Time: 0930 Location: [address] Dayroom  Group Topic: Stress Management  Goal Area(s) Addresses:  Patient will identify positive stress management techniques. Patient will identify benefits of using stress management post d/c.  Behavioral Response: Engaged  Intervention:  Stress Management  Activity :  Meditation.  LRT introduced the stress management technique of meditation.  LRT played [name] meditation that focused on impermanence.  Patients were to listen and follow as meditation played to engaged in the activity.   Education:  Stress Management, Discharge Planning.   Education Outcome: Acknowledges Education  Clinical Observations/Feedback: Pt listened and participated in group.     [name], LRT/CTRS    [name], [name] 03/13/2018 11:00 AM

## 2018-03-13 NOTE — Progress Notes (Addendum)
  [facility] Adult Case Management Discharge Plan :  Will you be returning to the same living situation after discharge:  Yes,  home At discharge, do you have transportation home?: Yes,  grandmother Do you have the ability to pay for your medications: Yes,  mental health  Release of information consent forms completed and submitted to medical records by CSW.   Patient to Follow up at: Follow-up Information    [facility] Follow up on 03/14/2018.   Specialty:  Family Medicine Why:  Medication management appointment is Tuesday, 2/18 at 11:15a. Please bring your current medications and discharge paperwork from this hospitalization.  Contact information: [address] [place] [address] [phone]        [facility] Follow up on 03/24/2018.   Why:  Please attend your therapy appointment is Friday, 2/28 at 10:00a.   Contact information: [address] [name] [address] P: [phone] F: [phone]       [facility] Follow up.   Why:  Referred for CST made. Social work Geophysicist/field seismologist will call you with assessment date once referral is processed. Thank you.   Contact information: [address] [name] [address] [phone]           Next level of care provider has access to [facility] Link:no  Safety Planning and Suicide Prevention discussed: Yes,  SPE completed with pt and her grandmother. SPI pamphlet and mobile crisis information provided.   Have you used any form of tobacco in the last 30 days? (Cigarettes, Smokeless Tobacco, Cigars, and/or Pipes): Yes  Has patient been referred to the Quitline?: Patient refused referral  Patient has been referred for addiction treatment: Yes  Rona Ravens, LCSW 03/13/2018, 11:55 AM

## 2018-03-14 NOTE — Progress Notes (Signed)
PT called CSW requesting a hsoptial note with admission and discharge date/return to work date of 2/18 be emailed to her employer at [email or web address]. Pt gave verbal permission for this transfer of information. Enid Cutter LCSW witnessed this verbal permission).   Jerilyn Gillaspie S. Alan Ripper, MSW, LCSW Clinical Social Worker 03/14/2018 10:59 AM

## 2018-03-16 ENCOUNTER — Ambulatory Visit (INDEPENDENT_AMBULATORY_CARE_PROVIDER_SITE_OTHER): Payer: Medicaid Other | Admitting: Pediatrics

## 2018-03-16 ENCOUNTER — Encounter: Payer: Self-pay | Admitting: Pediatrics

## 2018-03-16 VITALS — BP 120/75 | HR 77 | Ht 68.0 in | Wt 247.0 lb

## 2018-03-16 DIAGNOSIS — F332 Major depressive disorder, recurrent severe without psychotic features: Secondary | ICD-10-CM

## 2018-03-16 DIAGNOSIS — Z3202 Encounter for pregnancy test, result negative: Secondary | ICD-10-CM | POA: Diagnosis not present

## 2018-03-16 DIAGNOSIS — Z7251 High risk heterosexual behavior: Secondary | ICD-10-CM | POA: Diagnosis not present

## 2018-03-16 DIAGNOSIS — K921 Melena: Secondary | ICD-10-CM

## 2018-03-16 DIAGNOSIS — F411 Generalized anxiety disorder: Secondary | ICD-10-CM

## 2018-03-16 DIAGNOSIS — F1990 Other psychoactive substance use, unspecified, uncomplicated: Secondary | ICD-10-CM | POA: Diagnosis not present

## 2018-03-16 DIAGNOSIS — Z113 Encounter for screening for infections with a predominantly sexual mode of transmission: Secondary | ICD-10-CM

## 2018-03-16 LAB — POCT URINE PREGNANCY: Preg Test, Ur: NEGATIVE

## 2018-03-16 MED ORDER — MODAFINIL 200 MG PO TABS: 200.0000 mg | ORAL_TABLET | Freq: Every day | ORAL | 0 refills | Status: DC

## 2018-03-16 MED ORDER — ULIPRISTAL ACETATE 30 MG PO TABS
30.0000 mg | ORAL_TABLET | Freq: Once | ORAL | Status: AC
  Administered 2018-03-16: 30 mg via ORAL

## 2018-03-16 NOTE — Patient Instructions (Addendum)
Emergency contraception today  Please ask about ECT or TMS for your depression when you go to psychiatry next time.  Restart modafinil  Complete stool samples and return right after they are done.  We will see you in 4 weeks

## 2018-03-16 NOTE — Progress Notes (Signed)
History was provided by the patient.  [name] is a 18 y.o. female who is here for f/u post [facility].   PCP confirmed? Yes.    Alfonso Ramus T, FNP  HPI:   Still depressed but not acutely suicidal.  Needs refill on modafinil- didn't get sent from psych. Still concerned about memory- was better on modafinil in hopsital.  No contraception- unprotected IC 2 days ago- would like EC. Not currently interseted in ongoing contraception but does not want to get pregnant.  Sees psychiatry again in 2 weeks.  Starting CST (in home intensive) and regular therapist Friday Feels safe to son and self.  Had one period- is having bleeding with stools. This is happening every time she poops. Denies constipation. Not hard to get out. Stools 2-3 times daily. Denies abdominal pain.  She has some loss of appetite but does try and eat to get fuel.  Sleeping well at night with meds.   Has had skyla and mirena- possibly willing to try skyla again in the future but wants to be birth control free for now. Has not thought about implant. Worries about how it makes her hormones.   Review of Systems  Constitutional: Negative for malaise/fatigue.  HENT: Negative for ear pain and sore throat.   Eyes: Negative for double vision.  Respiratory: Positive for shortness of breath.   Cardiovascular: Negative for chest pain and palpitations.  Gastrointestinal: Positive for blood in stool. Negative for abdominal pain, constipation, diarrhea, nausea and vomiting.  Genitourinary: Negative for dysuria.  Musculoskeletal: Negative for joint pain and myalgias.  Skin: Negative for rash.  Neurological: Positive for headaches. Negative for dizziness.  Endo/Heme/Allergies: Does not bruise/bleed easily.     Patient Active Problem List   Diagnosis Date Noted  . Severe episode of recurrent major depressive disorder, without psychotic features (HCC) 10/10/2017  . Substance use disorder 06/08/2017  . Elevated blood pressure reading  04/13/2017  . 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 Hx of suicide attempt 03/19/2016  . PTSD (post-traumatic stress disorder) 03/19/2016  . Generalized anxiety disorder   . Suicidal ideation     Current Outpatient Medications on File Prior to Visit  Medication Sig Dispense Refill  . albuterol (PROVENTIL HFA;VENTOLIN HFA) 108 (90 Base) MCG/ACT inhaler Inhale 1 puff into the lungs every 6 (six) hours as needed for wheezing or shortness of breath.    . busPIRone (BUSPAR) 15 MG tablet Take 1 tablet (15 mg total) by mouth 3 (three) times daily. For anxiety 90 tablet 0  . fluticasone (FLONASE) 50 MCG/ACT nasal spray Place 2 sprays into both nostrils daily. For allergies 16 g 1  . hydrOXYzine (ATARAX/VISTARIL) 25 MG tablet Take 1 tablet (25 mg total) by mouth every 6 (six) hours as needed for anxiety. 60 tablet 0  . nicotine polacrilex (NICORETTE) 2 MG gum Take 1 each (2 mg total) by mouth as needed for smoking cessation. May buy from over the counter): For smoking cessation 100 tablet 0  . prazosin (MINIPRESS) 5 MG capsule Take 1 capsule (5 mg total) by mouth at bedtime. For PTSD symptoms/nightmares 30 capsule 0  . Prenatal Vit-Fe Fumarate-FA (PRENATAL MULTIVITAMIN) TABS tablet Take 1 tablet by mouth daily. (May buy from over the counter): Vitamin supplement    . traZODone (DESYREL) 50 MG tablet Take 1 tablet (50 mg total) by mouth at bedtime as needed for sleep. 30 tablet 0  . vortioxetine HBr (TRINTELLIX) 10 MG TABS tablet Take 1 tablet (10 mg total) by mouth daily.  For depression 30 tablet 0  . modafinil (PROVIGIL) 200 MG tablet Take 1 tablet (200 mg total) by mouth daily. For alertness (Patient not taking: Reported on 03/16/2018) 7 tablet 0   No current facility-administered medications on file prior to visit.     Allergies  Allergen Reactions  . Lactose Intolerance (Gi) Nausea And Vomiting and Other (See Comments)  . Tape Other (See Comments)    Slight skin irritation    Physical Exam:     Vitals:   03/16/18 1104  BP: 120/75  Pulse: 77  Weight: 247 lb (112 kg)  Height: 5\' 8"  (1.727 m)    Blood pressure percentiles are not available for patients who are 18 years or older. No LMP recorded.  Physical Exam Vitals signs and nursing note reviewed.  Constitutional:      General: She is not in acute distress.    Appearance: She is well-developed.  Neck:     Thyroid: No thyromegaly.  Cardiovascular:     Rate and Rhythm: Normal rate and regular rhythm.     Heart sounds: No murmur.  Pulmonary:     Breath sounds: Normal breath sounds.  Abdominal:     Palpations: Abdomen is soft. There is no mass.     Tenderness: There is no abdominal tenderness. There is no guarding.  Musculoskeletal:     Right lower leg: No edema.     Left lower leg: No edema.  Lymphadenopathy:     Cervical: No cervical adenopathy.  Skin:    General: Skin is warm.     Findings: No rash.  Neurological:     Mental Status: She is alert.     Comments: No tremor  Psychiatric:        Mood and Affect: Mood is depressed. Affect is flat.        Thought Content: Thought content includes suicidal ideation. Thought content does not include suicidal plan.      Assessment/Plan: 1. Severe episode of recurrent major depressive disorder, without psychotic features (HCC) Will refill her modafinil for her since she has not been able to get in touch with psych and I don't want her to be without it for 2 weeks as she feels that it was helping a good bit inpatient. Otherwise, medicatoins are managed by psychiatry. She continues to have severe depression but is not acutely suicidal today.  - modafinil (PROVIGIL) 200 MG tablet; Take 1 tablet (200 mg total) by mouth daily. For alertness  Dispense: 30 tablet; Refill: 0  2. Generalized anxiety disorder Continues with significant anxiety. Is on trintellix and buspar.   3. Substance use disorder Used cocaine just prior to entering [facility].   4. Unprotected sexual  intercourse Requests EC today.  - ulipristal acetate (ELLA) tablet 30 mg  5. Blood in stool Will get occult blood. Sent patient home with supplies.  - POCT Occult Blood Stool - POCT Occult Blood Stool - POCT Occult Blood Stool  6. Routine screening for STI (sexually transmitted infection) New partner.  - C. trachomatis/N. gonorrhoeae RNA  7. Pregnancy examination or test, negative result Negative.  - POCT urine pregnancy

## 2018-03-17 LAB — C. TRACHOMATIS/N. GONORRHOEAE RNA
C. trachomatis RNA, TMA: NOT DETECTED
N. gonorrhoeae RNA, TMA: NOT DETECTED

## 2018-03-29 ENCOUNTER — Ambulatory Visit: Payer: Medicaid Other | Admitting: Obstetrics and Gynecology

## 2018-03-30 ENCOUNTER — Ambulatory Visit: Payer: Medicaid Other | Admitting: Certified Nurse Midwife

## 2018-04-13 ENCOUNTER — Ambulatory Visit: Payer: Medicaid Other | Admitting: Pediatrics

## 2018-04-17 ENCOUNTER — Telehealth: Payer: Self-pay | Admitting: Pediatrics

## 2018-04-17 ENCOUNTER — Other Ambulatory Visit: Payer: Self-pay | Admitting: Pediatrics

## 2018-04-17 ENCOUNTER — Telehealth (INDEPENDENT_AMBULATORY_CARE_PROVIDER_SITE_OTHER): Payer: Medicaid Other | Admitting: Pediatrics

## 2018-04-17 ENCOUNTER — Ambulatory Visit: Payer: Medicaid Other | Admitting: Pediatrics

## 2018-04-17 ENCOUNTER — Telehealth: Payer: Self-pay

## 2018-04-17 DIAGNOSIS — F4310 Post-traumatic stress disorder, unspecified: Secondary | ICD-10-CM

## 2018-04-17 DIAGNOSIS — F332 Major depressive disorder, recurrent severe without psychotic features: Secondary | ICD-10-CM | POA: Diagnosis not present

## 2018-04-17 DIAGNOSIS — F411 Generalized anxiety disorder: Secondary | ICD-10-CM

## 2018-04-17 NOTE — Telephone Encounter (Signed)
Appointment scheduled per provider.

## 2018-04-17 NOTE — Telephone Encounter (Signed)
We should schedule her in an available afternoon slot. Thanks!

## 2018-04-17 NOTE — Telephone Encounter (Signed)
Telemedicine visit.   Phone visit with: patient physically located at home.  The following statements were read to the patient and/or parent.  Notification: The purpose of this phone visit is to provide medical care while limiting exposure to the novel coronavirus.    Consent: By engaging in this phone visit, you consent to the provision of healthcare.  Additionally, you authorize for your insurance to be billed for the services provided during this phone visit.    Reason for visit: psych f/u  Visit notes:  Patient says that her psychiatrist said that she was unable to help her. She told her that she was still depressed and referred her on to somewhere else- she can't remember where it was. She didn't call them and feel like she just isn't just going to take meds anymore. Agreeable to a referral to Dr. Jannifer Franklin for TMS consideration. Has passive SI but no plan/intent. Last time she had plan was about 2 weeks ago and her grandparents took her meds away from her. She is not currently taking any of her medications except occasionally the medication for sleep. She is sleeping ok when she takes it.   Was wondering about stool sample results- they were negative for blood. She is not seeing any blood in her stool any longer.   Thought for a short time she had a yeast infection but period started and it went away. She is having more regular periods. She is sexually active right now with both female and female partners. She is currently not taking birth control and uses condoms occasoinally. Has used plan B in the past.   Her son has been at his dad's house since they are out of school. His grandma is a bus driver so she is watching him during the day. They have been worried about her since she went to the hospital so giving her time to feel better.   Assessment /Plan: 1. MDD/GAD/PTSD: Multiple hospitalizations. Patient is dejected and has discontinued most of her medications. We discussed possibilities  that are non medicinal including TMS and ECT that that if she is amenable, I think we should explore this. She has not been to neuropsych care center previously so we will refer her there as they do TMS. She is safe to herself today. Her family continues to be engaged in her wellness. Her son is safe with his father's family at this time. Reminded her of crisis resources and that our referral coordinator will reach out.   Time spent on phone: 13 minutes I was physically located off site during this virtual visit.  Alfonso Ramus, FNP

## 2018-04-17 NOTE — Telephone Encounter (Signed)
1015: LVM to return call.

## 2018-04-17 NOTE — Telephone Encounter (Signed)
Patient missed call for visit. Asking for call back. Let pt know to await by the phone for provider to call.

## 2018-05-08 ENCOUNTER — Ambulatory Visit (INDEPENDENT_AMBULATORY_CARE_PROVIDER_SITE_OTHER): Payer: Medicaid Other | Admitting: Pediatrics

## 2018-05-08 ENCOUNTER — Other Ambulatory Visit: Payer: Self-pay

## 2018-05-08 ENCOUNTER — Telehealth: Payer: Self-pay | Admitting: Pediatrics

## 2018-05-08 DIAGNOSIS — Z7251 High risk heterosexual behavior: Secondary | ICD-10-CM | POA: Diagnosis not present

## 2018-05-08 DIAGNOSIS — Z113 Encounter for screening for infections with a predominantly sexual mode of transmission: Secondary | ICD-10-CM

## 2018-05-08 DIAGNOSIS — F332 Major depressive disorder, recurrent severe without psychotic features: Secondary | ICD-10-CM

## 2018-05-08 DIAGNOSIS — N898 Other specified noninflammatory disorders of vagina: Secondary | ICD-10-CM

## 2018-05-08 MED ORDER — LEVONORGESTREL 1.5 MG PO TABS: 1.5000 mg | ORAL_TABLET | Freq: Once | ORAL | 2 refills | Status: AC

## 2018-05-08 NOTE — Progress Notes (Signed)
Virtual Visit via Video Note  I connected with [name] 's patient  on 05/08/18 at  1:30 PM EDT by a video enabled telemedicine application and verified that I am speaking with the correct person using two identifiers.   Location of patient/parent: At home   I discussed the limitations of evaluation and management by telemedicine and the availability of in person appointments.  I discussed that the purpose of this phone visit is to provide medical care while limiting exposure to the novel coronavirus.  The patient expressed understanding and agreed to proceed.  Reason for visit: F/u of psychiatry   History of Present Illness:  Psychiatry appt was supposed to be today but she had the times mixed up. She is supposed to go at 9:15 am tomorrow morning now.   She has been "ok," not leaving the house. Mood is up and down. She is taking her bedtime medications sometimes.   SI/self harm thoughts sometimes. It is not every day anymore, it is intermittent. No plan.   Still staying at home with her grandparents.   Feels like she might have a vaginal infection. It is mild and sometimes goes away- itching and sometimes discharge is different. Slight odor sometimes- fishy a little bit. It is worse with sex and around time of period. LMP was at the beginning of April. Currently sexually active- took plan B yesterday or the day before yesterday. She is not sure what she wants to do for contraception long term. She is out of plan B and would like a refill. She has been on depo but was worried that she wasn't getting cycles with it. Won't remember pills. Patch caused cramping. She does need more condoms. She would like to come to clinic to get some more.    Observations/Objective:  Physical Exam Constitutional:      Appearance: Normal appearance.  Pulmonary:     Effort: Pulmonary effort is normal.  Musculoskeletal: Normal range of motion.  Neurological:     General: No focal deficit present.     Mental  Status: She is alert and oriented to person, place, and time.  Psychiatric:        Mood and Affect: Mood normal.        Behavior: Behavior normal.    Assessment and Plan:  1. Severe episode of recurrent major depressive disorder, without psychotic features (HCC) Psych tomorrow. She would benefit from ongoing medication management. She is seeing her therapist regularly again.   2. Unprotected sexual intercourse Will send plan B to pharmacy as she is out. She is overall trying to use condoms more and has not used plan B more than once in the last few months. We again discussed more regular contraceptive methods but she is contemplative at this point.   3. Vaginal discharge Will have her to clinic on Thursday for swabs, urine and some more condoms.   4. Routine screening for STI (sexually transmitted infection) Gc/ct in clinic.    Follow Up Instructions: 2 weeks via video. Scheduled with patient.    I discussed the assessment and treatment plan with the patient and/or parent/guardian. They were provided an opportunity to ask questions and all were answered. They agreed with the plan and demonstrated an understanding of the instructions.   They were advised to call back or seek an in-person evaluation in the emergency room if the symptoms worsen or if the condition fails to improve as anticipated.  I provided 15 minutes of non-face-to-face time during this  encounter. I was located off site during this encounter.  Alfonso Ramusaroline Hacker, FNP

## 2018-05-08 NOTE — Telephone Encounter (Signed)
Patient was scheduled for 1:30PM webex follow up with Noe Gens. Patient did not log onto webex appointment and did not respond to doximity text sent by [facility]. I LVM for patient to give [place] a call back to reschedule webex follow up.

## 2018-05-11 ENCOUNTER — Ambulatory Visit (INDEPENDENT_AMBULATORY_CARE_PROVIDER_SITE_OTHER): Payer: Medicaid Other | Admitting: Family

## 2018-05-11 ENCOUNTER — Other Ambulatory Visit: Payer: Self-pay

## 2018-05-11 DIAGNOSIS — Z113 Encounter for screening for infections with a predominantly sexual mode of transmission: Secondary | ICD-10-CM

## 2018-05-11 NOTE — Progress Notes (Signed)
Pt here today for wet prep and STI check. Pt confidential number in chart and specimens obtained successfully.

## 2018-05-12 LAB — WET PREP BY MOLECULAR PROBE
Candida species: DETECTED — AB
Gardnerella vaginalis: NOT DETECTED
MICRO NUMBER:: 400352
SPECIMEN QUALITY:: ADEQUATE
Trichomonas vaginosis: NOT DETECTED

## 2018-05-12 LAB — C. TRACHOMATIS/N. GONORRHOEAE RNA
C. trachomatis RNA, TMA: NOT DETECTED
N. gonorrhoeae RNA, TMA: NOT DETECTED

## 2018-05-15 ENCOUNTER — Other Ambulatory Visit: Payer: Self-pay | Admitting: Pediatrics

## 2018-05-15 MED ORDER — FLUCONAZOLE 150 MG PO TABS: ORAL_TABLET | ORAL | 0 refills | Status: DC

## 2018-05-24 ENCOUNTER — Ambulatory Visit (INDEPENDENT_AMBULATORY_CARE_PROVIDER_SITE_OTHER): Payer: Medicaid Other | Admitting: Pediatrics

## 2018-05-24 ENCOUNTER — Other Ambulatory Visit: Payer: Self-pay

## 2018-05-24 DIAGNOSIS — F411 Generalized anxiety disorder: Secondary | ICD-10-CM | POA: Diagnosis not present

## 2018-05-24 DIAGNOSIS — F1990 Other psychoactive substance use, unspecified, uncomplicated: Secondary | ICD-10-CM

## 2018-05-24 DIAGNOSIS — F332 Major depressive disorder, recurrent severe without psychotic features: Secondary | ICD-10-CM

## 2018-05-24 DIAGNOSIS — Z3009 Encounter for other general counseling and advice on contraception: Secondary | ICD-10-CM

## 2018-05-24 DIAGNOSIS — F4310 Post-traumatic stress disorder, unspecified: Secondary | ICD-10-CM

## 2018-05-24 NOTE — Progress Notes (Signed)
Virtual Visit via Video Note  I connected with [name] 's patient  on 05/24/18 at  2:00 PM EDT by a video enabled telemedicine application and verified that I am speaking with the correct person using two identifiers.   Location of patient/parent: At home   I discussed the limitations of evaluation and management by telemedicine and the availability of in person appointments.  I discussed that the purpose of this phone visit is to provide medical care while limiting exposure to the novel coronavirus.  The patient expressed understanding and agreed to proceed.  Reason for visit: psych and contraception f/u  History of Present Illness:  Was started on lamictal and abilify. Hasn't started abilify yet as it was just rx'ed today. Patient has been on lamictal in the past and her mom reports she did well. Depression 4/10, anxiety 5/10. Some thoughts of self harm/SI, but not today. She had some yesterday when she was "in a mood." she cried and went to her friend's house. This made her feel better.  Something is telling her to get back on bith control. She says she is going to be consistent with condoms but then doesn't follow through. Talked to the person she is mainly having sex with and he is ok using condoms but they don't always follow through. mirena caused bad headaches, depo a lot of BTB. She has not tried nexplanon, is worried about getting lost but is contemplative about it. Her period just ended recently.    Observations/Objective:   Physical Exam Constitutional:      Appearance: Normal appearance.  Pulmonary:     Effort: Pulmonary effort is normal.  Neurological:     General: No focal deficit present.     Mental Status: She is alert and oriented to person, place, and time.  Psychiatric:        Mood and Affect: Mood normal.        Behavior: Behavior normal.      Assessment and Plan:  1. Severe episode of recurrent major depressive disorder, without psychotic features  (HCC) Followed by psych at Dr. Roberts Gaudy office now. She is doing well and she is back on lamotrigine and abilify. Her compliance has been good. I will continue to follow along and encourage her .  2. PTSD (post-traumatic stress disorder) Continue counseling.   3. Substance use disorder Stable.   4. Generalized anxiety disorder As above.   5. Encounter for counseling regarding contraception Discussed contraception options at length. She is thinking about nexplanon or OCP but didn't feel ready to commit to either today. She has used plan B this month and is still working to use condoms more consistently. Encouraged to mychart if she makes a decision, otherwise I will f/u with her in 1 month to discuss further. She was in agreement.   Follow Up Instructions: 4 weeks    I discussed the assessment and treatment plan with the patient and/or parent/guardian. They were provided an opportunity to ask questions and all were answered. They agreed with the plan and demonstrated an understanding of the instructions.   They were advised to call back or seek an in-person evaluation in the emergency room if the symptoms worsen or if the condition fails to improve as anticipated.  I provided 15 minutes of non-face-to-face time and 0 minutes of care coordination during this encounter I was located at off site during this encounter.  Alfonso Ramus, FNP

## 2018-05-25 ENCOUNTER — Encounter: Payer: Self-pay | Admitting: Family

## 2018-05-25 NOTE — Progress Notes (Signed)
Reviewed RN note and agree with POC.

## 2018-06-05 IMAGING — CT CT RENAL STONE PROTOCOL
2 of 3 series · 17 of 46 positions shown, 19 images · non-contrast
Comparison: 10/31/2016

CLINICAL DATA: Flank pain, stone disease suspected

EXAM:
CT ABDOMEN AND PELVIS WITHOUT CONTRAST
TECHNIQUE: Multidetector CT imaging of the abdomen and pelvis was performed
following the standard protocol without IV contrast.

[Series 3: lung · axial · 0.74mm/px · z∈[-129,-29]mm · 14 of 58 slices shown, 16 images]
[im 4/58  soft-tissue]
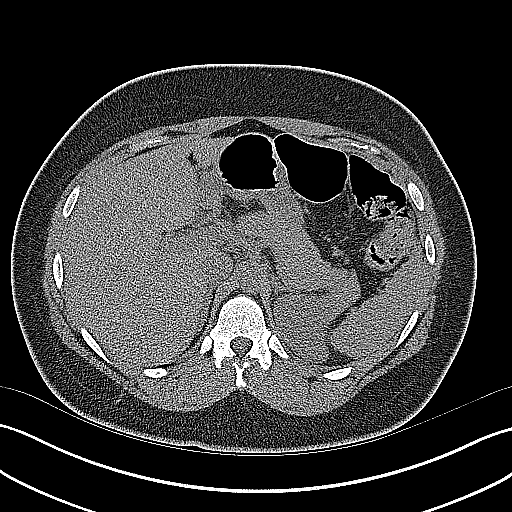
[im 4/58  bone]
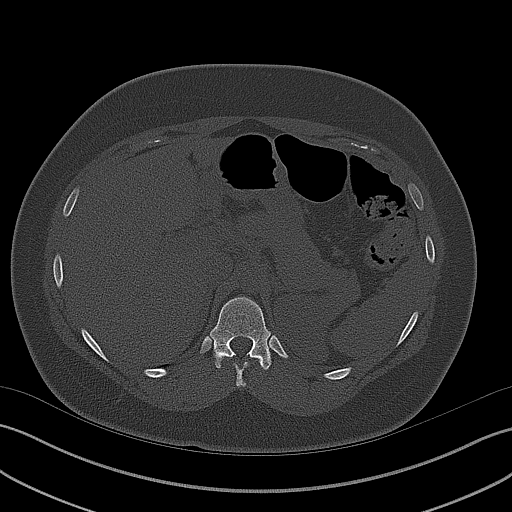
[im 8/58  soft-tissue]
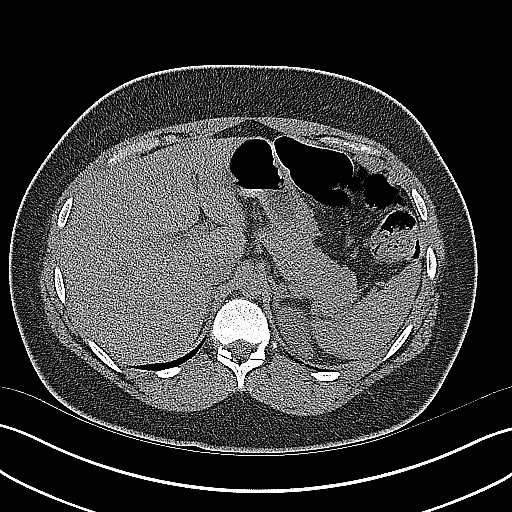
[im 12/58  soft-tissue]
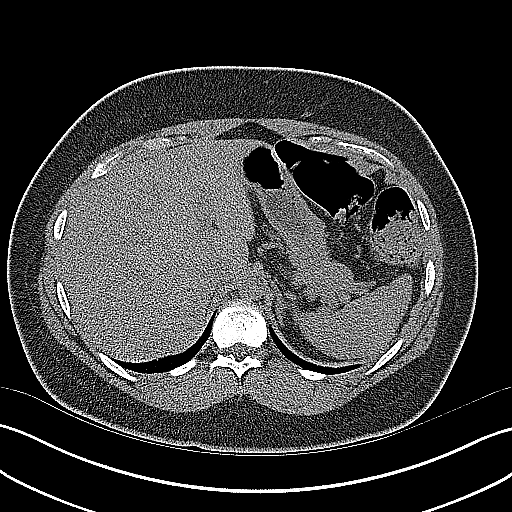
[im 15/58  soft-tissue]
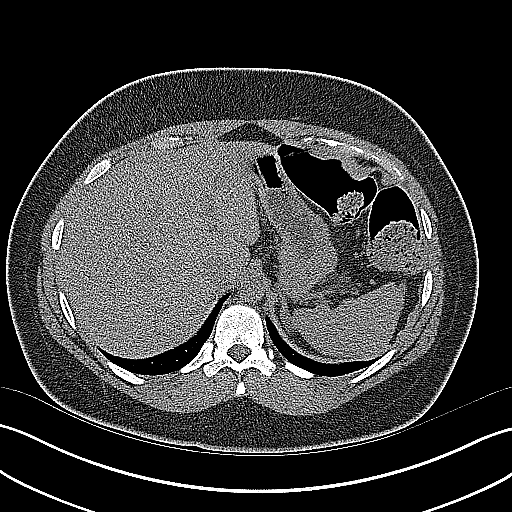
[im 19/58  soft-tissue]
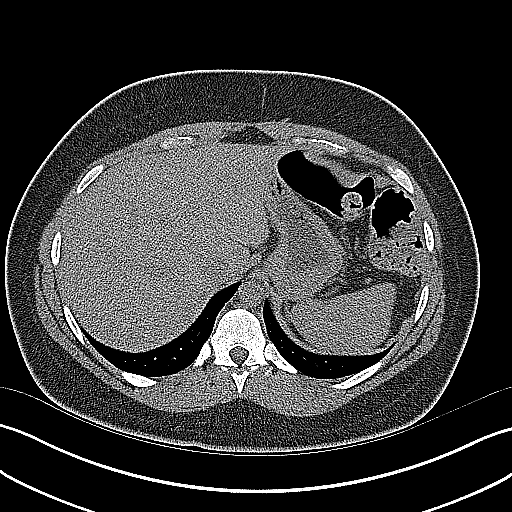
[im 23/58  soft-tissue]
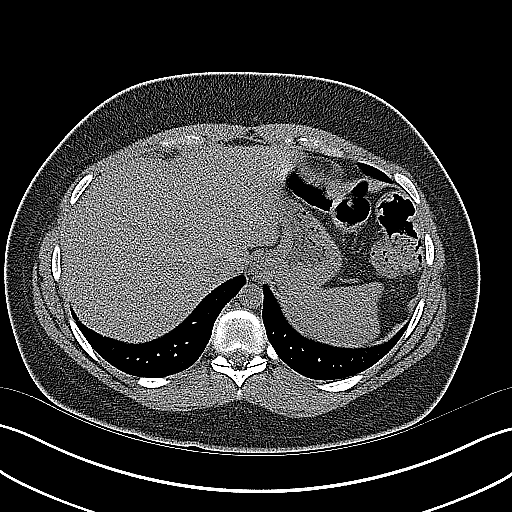
[im 26/58  soft-tissue]
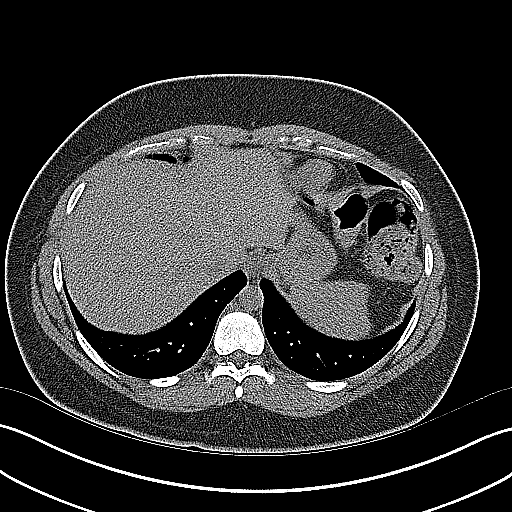
[im 32/58  soft-tissue]
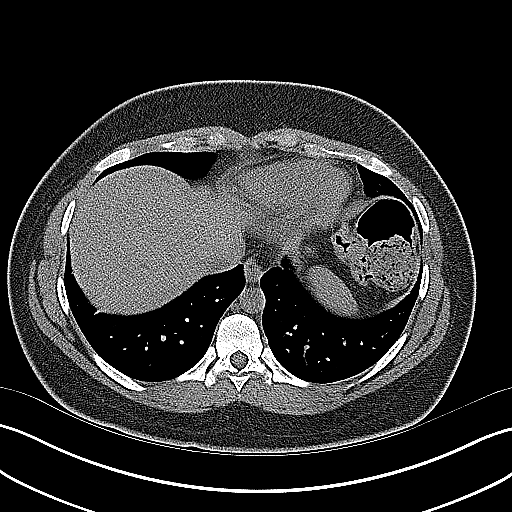
[im 35/58  soft-tissue]
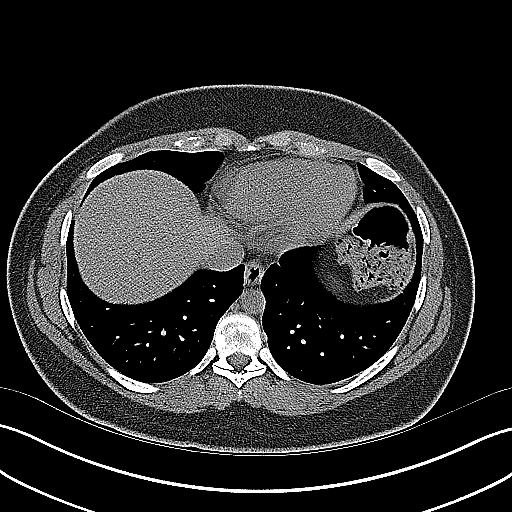
[im 35/58  bone]
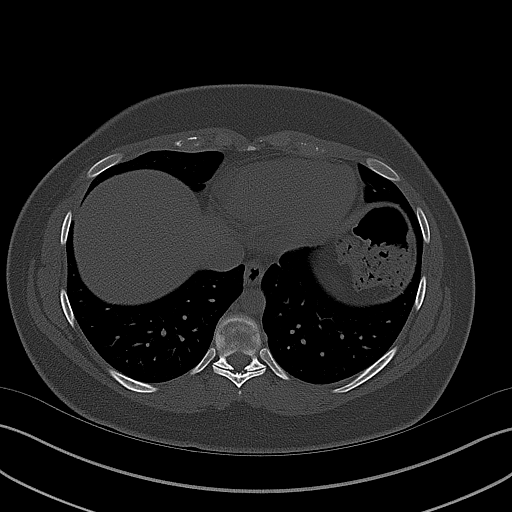
[im 39/58  soft-tissue]
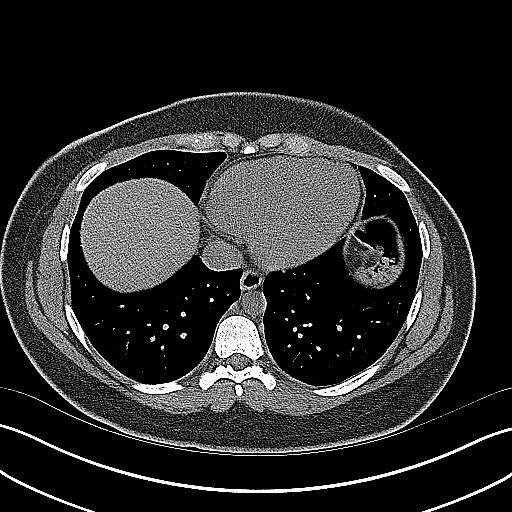
[im 43/58  soft-tissue]
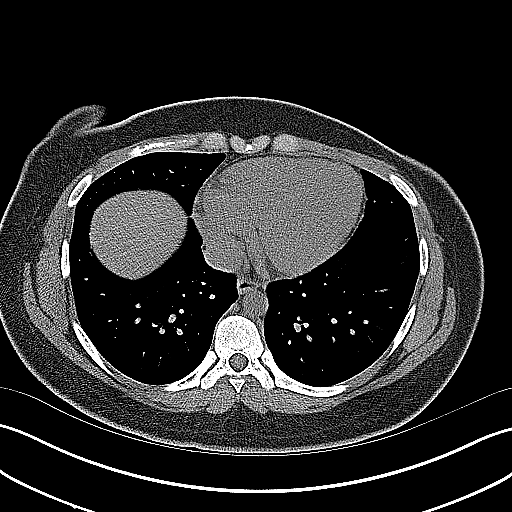
[im 46/58  soft-tissue]
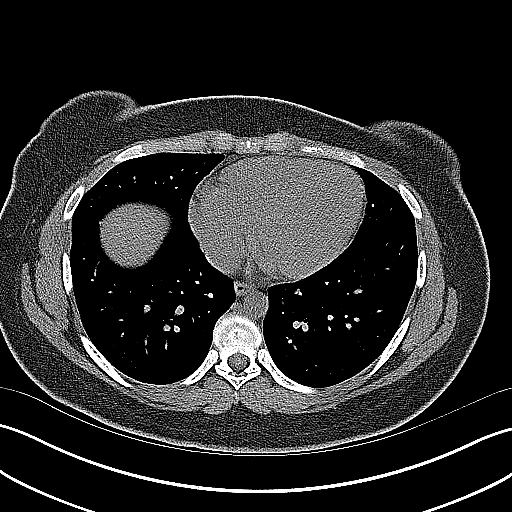
[im 50/58  soft-tissue]
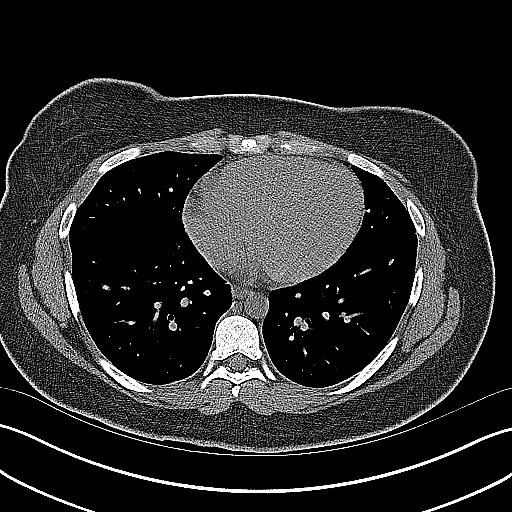
[im 54/58  soft-tissue]
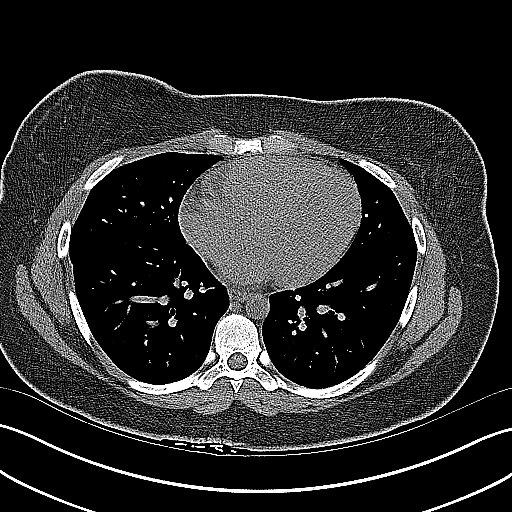

[Series 4: coronal · coronal · 0.88mm/px · 3 of 140 slices shown]
[im 47/140  soft-tissue]
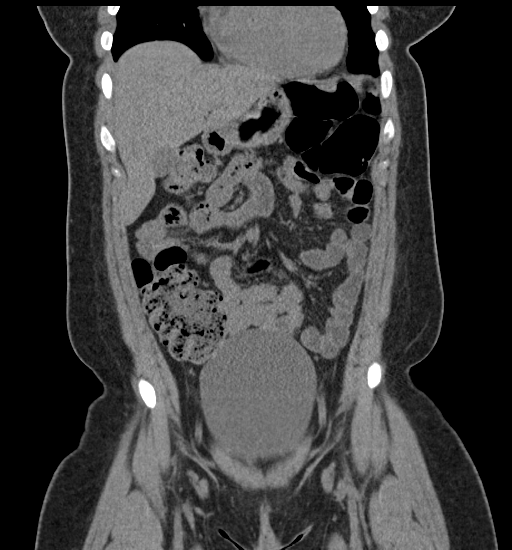
[im 62/140  soft-tissue]
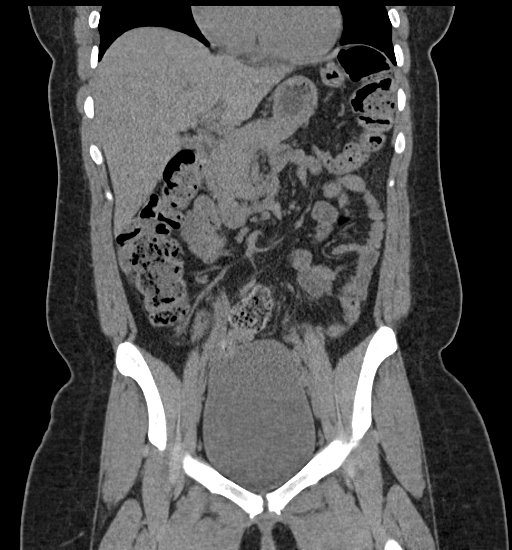
[im 78/140  soft-tissue]
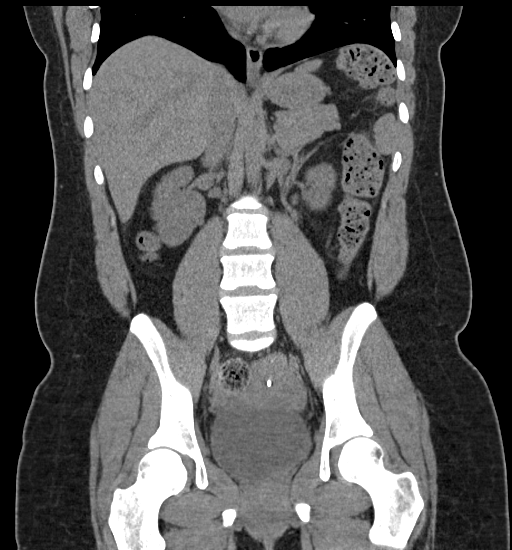

[17 of 46 positions shown; findings below may reference images not displayed]

FINDINGS: LOWER CHEST: Lung bases are clear. Included heart size is normal. No
pericardial effusion.

HEPATOBILIARY: Liver and gallbladder are normal.

PANCREAS: Normal.

SPLEEN: Normal.

ADRENALS/URINARY TRACT: Kidneys are orthotopic. No nephrolithiasis,
hydronephrosis or solid renal masses. The unopacified ureters are
normal in course and caliber. Urinary bladder is distended without
calculus or focal mural thickening. Normal adrenal glands.

STOMACH/BOWEL: The stomach, small and large bowel are normal in
course and caliber without inflammatory changes. Normal appendix.
Moderate fecal retention throughout the colon.

VASCULAR/LYMPHATIC: Aortoiliac vessels are normal in course and
caliber. No lymphadenopathy by CT size criteria.

REPRODUCTIVE: IUD noted within the expected endometrial cavity. No
adnexal mass.

OTHER: No intraperitoneal free fluid or free air.

MUSCULOSKELETAL: Nonacute.
IMPRESSION: 1. Mild urinary distention of the bladder without focal mural
thickening or calculus. No obstructive uropathy or nephrolithiasis.
2. Increased colonic stool burden query constipation.

## 2018-06-21 ENCOUNTER — Ambulatory Visit: Payer: Self-pay | Admitting: Pediatrics

## 2018-06-28 ENCOUNTER — Ambulatory Visit (INDEPENDENT_AMBULATORY_CARE_PROVIDER_SITE_OTHER): Payer: Medicaid Other | Admitting: Family

## 2018-06-28 ENCOUNTER — Other Ambulatory Visit: Payer: Self-pay

## 2018-06-28 ENCOUNTER — Encounter: Payer: Self-pay | Admitting: Family

## 2018-06-28 VITALS — Ht 68.58 in | Wt 253.6 lb

## 2018-06-28 DIAGNOSIS — Z113 Encounter for screening for infections with a predominantly sexual mode of transmission: Secondary | ICD-10-CM

## 2018-06-28 DIAGNOSIS — Z3202 Encounter for pregnancy test, result negative: Secondary | ICD-10-CM | POA: Diagnosis not present

## 2018-06-28 LAB — POCT URINE PREGNANCY: Preg Test, Ur: NEGATIVE

## 2018-06-28 NOTE — Progress Notes (Signed)
Patient presents for STI screenings. Self-swabbed. Will call with results.

## 2018-06-29 ENCOUNTER — Other Ambulatory Visit: Payer: Self-pay | Admitting: Family

## 2018-06-29 LAB — WET PREP BY MOLECULAR PROBE
Candida species: NOT DETECTED
MICRO NUMBER:: 533130
SPECIMEN QUALITY:: ADEQUATE
Trichomonas vaginosis: NOT DETECTED

## 2018-06-29 LAB — C. TRACHOMATIS/N. GONORRHOEAE RNA
C. trachomatis RNA, TMA: NOT DETECTED
N. gonorrhoeae RNA, TMA: NOT DETECTED

## 2018-06-29 MED ORDER — METRONIDAZOLE 500 MG PO TABS: 500.0000 mg | ORAL_TABLET | Freq: Two times a day (BID) | ORAL | 0 refills | Status: DC

## 2018-07-03 ENCOUNTER — Other Ambulatory Visit: Payer: Self-pay

## 2018-07-03 ENCOUNTER — Other Ambulatory Visit: Payer: Self-pay | Admitting: Pediatrics

## 2018-07-03 ENCOUNTER — Ambulatory Visit (INDEPENDENT_AMBULATORY_CARE_PROVIDER_SITE_OTHER): Payer: Medicaid Other | Admitting: Pediatrics

## 2018-07-03 DIAGNOSIS — Z3009 Encounter for other general counseling and advice on contraception: Secondary | ICD-10-CM

## 2018-07-03 DIAGNOSIS — Z716 Tobacco abuse counseling: Secondary | ICD-10-CM

## 2018-07-03 DIAGNOSIS — B9689 Other specified bacterial agents as the cause of diseases classified elsewhere: Secondary | ICD-10-CM

## 2018-07-03 DIAGNOSIS — F411 Generalized anxiety disorder: Secondary | ICD-10-CM | POA: Diagnosis not present

## 2018-07-03 DIAGNOSIS — F332 Major depressive disorder, recurrent severe without psychotic features: Secondary | ICD-10-CM

## 2018-07-03 DIAGNOSIS — F1990 Other psychoactive substance use, unspecified, uncomplicated: Secondary | ICD-10-CM

## 2018-07-03 DIAGNOSIS — N760 Acute vaginitis: Secondary | ICD-10-CM

## 2018-07-03 MED ORDER — PLAN B ONE-STEP 1.5 MG PO TABS: 1.5000 mg | ORAL_TABLET | Freq: Once | ORAL | 1 refills | Status: AC

## 2018-07-03 NOTE — Progress Notes (Signed)
Virtual Visit via Video Note  I connected with [name] on 07/03/18 at  4:00 PM EDT by a video enabled telemedicine application and verified that I am speaking with the correct person using two identifiers.  Location: Patient: home/ [facility]    I discussed the limitations of evaluation and management by telemedicine and the availability of in person appointments. The patient expressed understanding and agreed to proceed.  History of Present Illness:  [name] is a 18 y.o. female who is here for birth control counseling and follow up of psych hx.   Pt reports that she is doing well. Problems outlines below:  BV: symptoms have improved. No vaginal discharge, pruritis or odor. Started taking flagyl 06/30/2018 and is aware that she needs to complete the full 7 days   CONTRACEPTION: Pt contacted clinic this AM via Mychart for "plan B ASAP." Rx with 1 refill was sent to the pharmacy but pharmacy was out so she will pick up on 6/9. We had a lengthy discussion about her previously failed methods (Mirena, skyla, the pill, and depo), contraception side effects, undesired pregnancy, having unprotected sex, and alternative options. She is aware that she needs to be consistent with using protection for reasons other than pregnancy (STIs) but does not "feel like using it in the moment." She is sexually active with 3 guys, 2 of which she consistently uses condom with. She was recently dx with BV but denies other STI sx. She does not desire pregnancy. She is reassured that she has been compliant with her psych meds and believes that she may be able to take OCPs consistently. We talked about her hx of missing doses, resulting in previous pregnancy.   We discussed risks and benefits of Nexplanon. She expressed concerns about it breaking and traveling throughout her body. Told her that although this is a risk, the risk is low and that I have not personally seen it happen. Denies hx of liver disease.  Has a family history of many generations with breast cancer (gma- had single mastectomy, great gma, great-great gma, and mother had lumps requiring 2 resections but none cancerous). LMP: "Mid May 2020"  SMOKING: still smoking cigarettes/cigars, an average of 1-3 daily. Also smokes weed (1-5 blunts daily). Has dizziness and SOB with smoking. Has albuterol PRN but hasn't had to use it. Is in close contact with substance abuse counselor and therapist, and is optimistic about quitting in the near future. Feels like her "lungs are not made for this," and that God is telling her to stop. Knows she needs to quit for her own health and the health of her 2 yo son. She remembers being upset when she saw her mom smoking as a little child and does not want her son to experience that. She is convinced that it is just a habit and that she is not addicted to the nicotine. She has multiple packs of nicotine gum at home that she is not using because she believes they are ineffective. Desires to quit. Information was provided.   PSYCH: Mood has been really good. Has anxiety at baseline but it is much improved and without anxiety attacks. Been compliant with all meds (lamictal, abilify, hydroxyzine, trazodone, and prazosin), and her night-time meds have helped her sleep. No depressive sx. No SI or HI. Continues to see psych. Feels like she "has a good team" and is "very supported."  Review of Systems  Constitutional: Negative for fever.  HENT: Negative for sore throat.  Respiratory: Positive for shortness of breath.   Cardiovascular: Negative for chest pain and palpitations.  Gastrointestinal: Negative for abdominal pain, nausea and vomiting.  Genitourinary: Negative for dysuria, hematuria and urgency.  Skin: Negative for rash.  Neurological: Positive for dizziness. Negative for tremors, focal weakness and headaches.  Psychiatric/Behavioral: Positive for substance abuse. Negative for depression, hallucinations and  suicidal ideas. The patient is nervous/anxious. The patient does not have insomnia.      Patient Active Problem List   Diagnosis Date Noted  . Severe episode of recurrent major depressive disorder, without psychotic features (HCC) 10/10/2017  . Substance use disorder 06/08/2017  . Elevated blood pressure reading 04/13/2017  . 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 Hx of suicide attempt 03/19/2016  . PTSD (post-traumatic stress disorder) 03/19/2016  . Generalized anxiety disorder   . Suicidal ideation     Current Outpatient Medications on File Prior to Visit  Medication Sig Dispense Refill  . albuterol (PROVENTIL HFA;VENTOLIN HFA) 108 (90 Base) MCG/ACT inhaler Inhale 1 puff into the lungs every 6 (six) hours as needed for wheezing or shortness of breath.    . fluconazole (DIFLUCAN) 150 MG tablet Take 1 tablet today and 1 tablet 3 days from now 2 tablet 0  . fluticasone (FLONASE) 50 MCG/ACT nasal spray Place 2 sprays into both nostrils daily. For allergies 16 g 1  . hydrOXYzine (ATARAX/VISTARIL) 25 MG tablet Take 1 tablet (25 mg total) by mouth every 6 (six) hours as needed for anxiety. 60 tablet 0  . lamoTRIgine (LAMICTAL) 25 MG tablet     . metroNIDAZOLE (FLAGYL) 500 MG tablet Take 1 tablet (500 mg total) by mouth 2 (two) times daily. 14 tablet 0  . prazosin (MINIPRESS) 5 MG capsule Take 1 capsule (5 mg total) by mouth at bedtime. For PTSD symptoms/nightmares 30 capsule 0  . Prenatal Vit-Fe Fumarate-FA (PRENATAL MULTIVITAMIN) TABS tablet Take 1 tablet by mouth daily. (May buy from over the counter): Vitamin supplement    . traZODone (DESYREL) 50 MG tablet Take 1 tablet (50 mg total) by mouth at bedtime as needed for sleep. 30 tablet 0   No current facility-administered medications on file prior to visit.     Allergies  Allergen Reactions  . Lactose Intolerance (Gi) Nausea And Vomiting and Other (See Comments)  . Tape Other (See Comments)    Slight skin irritation    Social  History: Confidentiality was discussed with the patient and if applicable, with caregiver as well. Tobacco: smokes 1-3 cigarettes/ cigars daily Secondhand smoke exposure? yes  Drugs/EtOH: Alcohol socially Sexually active? yes - 3 men  Safety: no concerns Last STI Screening: 06/28/2018- negative G/C; + BV Pregnancy Prevention: not on birth control and inconsistent w/ condom use  Observations/Objective:  Physical Exam  Constitutional: She is oriented to person, place, and time and well-developed, well-nourished, and in no distress.  Smoking a cigar intermittently   Pulmonary/Chest: Effort normal.  Neurological: She is alert and oriented to person, place, and time.  Psychiatric: Mood, memory, affect and judgment normal.   Assessment and Plan: 1. Birth control counseling Discussed BC hx and BC options at length. Although it makes her anxious, she would like to get Nexplanon placed. Reassurance was provided. Appt scheduled for 07/06/2018 at 10AM. She verbally confirmed that she received the appointment confirmation and that she will be here Thursday.  2. Severe episode of recurrent major depressive disorder, without psychotic features (HCC) Improved and stable. Continue current medications regimen. Continue to follow w/ psych Dr. Mervyn SkeetersA.  3. Substance use disorder Followed by substance abuse counselor. Desires to quit. Information provided.    4. Generalized anxiety disorder Improved. Continue medication regimen. Hydroxyzine PRN. See above   5. Encounter for smoking cessation counseling Discussed smoking cessation at length. See above for details. Information provided for quitting resources. Encouraged her to call.   6. Bacterial vaginosis:  Sx improved. Started metronidazole on 06/30/2018 for 7 day course.  Follow Up Instructions:  I discussed the assessment and treatment plan with the patient. The patient was provided an opportunity to ask questions and all were answered. The patient  agreed with the plan and demonstrated an understanding of the instructions.   The patient was advised to call back or seek an in-person evaluation if the symptoms worsen or if the condition fails to improve as anticipated.  I provided 45 minutes of non-face-to-face time during this encounter.   Cayne Yom, DO

## 2018-07-04 NOTE — Progress Notes (Signed)
I have reviewed the resident's note and plan of care and helped develop the plan as necessary.  

## 2018-07-06 ENCOUNTER — Ambulatory Visit: Payer: Self-pay | Admitting: Family

## 2018-07-11 ENCOUNTER — Ambulatory Visit (INDEPENDENT_AMBULATORY_CARE_PROVIDER_SITE_OTHER): Payer: Medicaid Other

## 2018-07-11 DIAGNOSIS — N760 Acute vaginitis: Secondary | ICD-10-CM | POA: Diagnosis not present

## 2018-07-11 NOTE — Progress Notes (Signed)
Pt here in clinic today for concerns of possible yeast infection. Pt was in the office on 6/4 and diagnosed with BV. Pt took all medication as prescribed. Swabbed in clinic today and wil call patient with results. Suggested patient be seen in the office if symptoms persist or worsen.

## 2018-07-12 LAB — WET PREP BY MOLECULAR PROBE
Candida species: NOT DETECTED
Gardnerella vaginalis: NOT DETECTED
MICRO NUMBER:: 574615
SPECIMEN QUALITY:: ADEQUATE
Trichomonas vaginosis: NOT DETECTED

## 2018-07-19 ENCOUNTER — Telehealth: Payer: Self-pay | Admitting: *Deleted

## 2018-07-19 NOTE — Telephone Encounter (Signed)
Left patient a message to call the office and answer pre-screening questions.

## 2018-07-20 ENCOUNTER — Ambulatory Visit: Payer: Medicaid Other | Admitting: Family

## 2018-07-25 ENCOUNTER — Telehealth: Payer: Self-pay

## 2018-07-25 NOTE — Telephone Encounter (Signed)
Pre-screening for onsite visit  1. Who is bringing the patient to the visit?  BRINGING SELF  Informed only one adult can bring patient to the visit to limit possible exposure to COVID19 and facemasks must be worn while in the building by the patient (ages 33 and older) and adult.  2. Has the person bringing the patient or the patient been around anyone with suspected or confirmed COVID-19 in the last 14 days?  NO  3. Has the person bringing the patient or the patient been around anyone who has been tested for COVID-19 in the last 14 days?  NO  4. Has the person bringing the patient or the patient had any of these symptoms in the last 14 days?  NO   Fever (temp 100 F or higher) Breathing problems Cough Sore throat Body aches Chills Vomiting Diarrhea   If all answers are negative, advise patient to call our office prior to your appointment if you or the patient develop any of the symptoms listed above.  PT WAS ADVISED If any answers are yes, cancel in-office visit and schedule the patient for a same day telehealth visit with a provider to discuss the next steps.

## 2018-07-26 ENCOUNTER — Ambulatory Visit: Payer: Medicaid Other | Admitting: Family

## 2018-07-31 ENCOUNTER — Emergency Department (HOSPITAL_COMMUNITY)
Admission: EM | Admit: 2018-07-31 | Discharge: 2018-07-31 | Disposition: A | Discharge status: Home / Self Care | Payer: Medicaid Other | Attending: Emergency Medicine | Admitting: Emergency Medicine

## 2018-07-31 ENCOUNTER — Encounter (HOSPITAL_COMMUNITY): Payer: Self-pay | Admitting: Emergency Medicine

## 2018-07-31 ENCOUNTER — Other Ambulatory Visit: Payer: Self-pay

## 2018-07-31 DIAGNOSIS — Z79899 Other long term (current) drug therapy: Secondary | ICD-10-CM | POA: Insufficient documentation

## 2018-07-31 DIAGNOSIS — J020 Streptococcal pharyngitis: Secondary | ICD-10-CM | POA: Diagnosis not present

## 2018-07-31 DIAGNOSIS — F17290 Nicotine dependence, other tobacco product, uncomplicated: Secondary | ICD-10-CM | POA: Insufficient documentation

## 2018-07-31 DIAGNOSIS — J029 Acute pharyngitis, unspecified: Secondary | ICD-10-CM | POA: Diagnosis present

## 2018-07-31 LAB — GROUP A STREP BY PCR: Group A Strep by PCR: DETECTED — AB

## 2018-07-31 MED ORDER — LIDOCAINE VISCOUS HCL 2 % MT SOLN
15.0000 mL | Freq: Once | OROMUCOSAL | Status: AC
  Administered 2018-07-31: 15 mL via ORAL
  Filled 2018-07-31: qty 15

## 2018-07-31 MED ORDER — IBUPROFEN 600 MG PO TABS: 600.0000 mg | ORAL_TABLET | Freq: Four times a day (QID) | ORAL | 0 refills | Status: DC | PRN

## 2018-07-31 MED ORDER — ALUM & MAG HYDROXIDE-SIMETH 200-200-20 MG/5ML PO SUSP
30.0000 mL | Freq: Once | ORAL | Status: AC
  Administered 2018-07-31: 30 mL via ORAL
  Filled 2018-07-31: qty 30

## 2018-07-31 MED ORDER — CHLORASEPTIC 1.4 % MT LIQD: 1.0000 | OROMUCOSAL | 0 refills | Status: DC | PRN

## 2018-07-31 MED ORDER — IBUPROFEN 800 MG PO TABS
800.0000 mg | ORAL_TABLET | Freq: Once | ORAL | Status: AC
  Administered 2018-07-31: 800 mg via ORAL
  Filled 2018-07-31: qty 1

## 2018-07-31 MED ORDER — PENICILLIN G BENZATHINE 1200000 UNIT/2ML IM SUSP
1.2000 10*6.[IU] | Freq: Once | INTRAMUSCULAR | Status: AC
  Administered 2018-07-31: 1.2 10*6.[IU] via INTRAMUSCULAR
  Filled 2018-07-31: qty 2

## 2018-07-31 NOTE — ED Provider Notes (Signed)
[facility]   CSN: [id number] Arrival date & time: 07/31/18  1957    History   Chief Complaint Chief Complaint  Patient presents with  . Sore Throat    HPI Yolanda Christell ConstantMoore is a 18 y.o. female.     18 year old female presents to the emergency department for evaluation of odynophagia x2 days.  States that symptoms have been [name] since yesterday and have mildly worsened.  She denies taking any medications over-the-counter for symptoms.  Has been increasingly fatigued with subjective fever.  Patient continues to drink fluids despite symptoms.  No inability to swallow, cough, congestion.  Denies any known sick contacts.  The history is provided by the patient. No language interpreter was used.  Sore Throat    Past Medical History:  Diagnosis Date  . Anxiety   . Headache(784.0)   . Hx of suicide attempt   . Major depressive disorder   . PTSD (post-traumatic stress disorder)     Patient Active Problem List   Diagnosis Date Noted  . Severe episode of recurrent major depressive disorder, without psychotic features (HCC) 10/10/2017  . Substance use disorder 06/08/2017  . Elevated blood pressure reading 04/13/2017  . 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 Hx of suicide attempt 03/19/2016  . PTSD (post-traumatic stress disorder) 03/19/2016  . Generalized anxiety disorder   . Suicidal ideation     History reviewed. No pertinent surgical history.   OB History    Gravida  1   Para  1   Term  1   Preterm  0   AB  0   Living  1     SAB  0   TAB  0   Ectopic  0   Multiple  0   Live Births  1            Home Medications    Prior to Admission medications   Medication Sig Start Date End Date Taking? Authorizing Provider  albuterol (PROVENTIL HFA;VENTOLIN HFA) 108 (90 Base) MCG/ACT inhaler Inhale 1 puff into the lungs every 6 (six) hours as needed for wheezing or shortness of breath. 03/13/18   Armandina StammerNwoko, Agnes I, NP   ARIPiprazole (ABILIFY) 2 MG tablet TK 1 T PO D 06/16/18   [provider]  fluconazole (DIFLUCAN) 150 MG tablet Take 1 tablet today and 1 tablet 3 days from now 05/15/18   Alfonso RamusHacker, Caroline T, FNP  fluticasone [facility](FLONASE) 50 MCG/ACT nasal spray Place 2 sprays into both nostrils daily. For allergies 03/13/18   Armandina StammerNwoko, Agnes I, NP  hydrOXYzine (ATARAX/VISTARIL) 25 MG tablet Take 1 tablet (25 mg total) by mouth every 6 (six) hours as needed for anxiety. 03/13/18   Armandina StammerNwoko, Agnes I, NP  ibuprofen (ADVIL) 600 MG tablet Take 1 tablet (600 mg total) by mouth every 6 (six) hours as needed. 07/31/18   Antony MaduraHumes, Jeneen Doutt, PA-C  lamoTRIgine (LAMICTAL) 100 MG tablet TK 1 T PO D 06/16/18   [provider]  metroNIDAZOLE (FLAGYL) 500 MG tablet Take 1 tablet (500 mg total) by mouth 2 (two) times daily. 06/29/18   Georges MouseJones, Christy M, NP  phenol (CHLORASEPTIC) 1.4 % LIQD Use as directed 1 spray in the mouth or throat as needed for throat irritation / pain. 07/31/18   Antony MaduraHumes, Jaxsyn Catalfamo, PA-C  prazosin (MINIPRESS) 5 MG capsule Take 1 capsule (5 mg total) by mouth at bedtime. For PTSD symptoms/nightmares 03/13/18   Sanjuana KavaNwoko, Agnes I, NP  Prenatal Vit-Fe Fumarate-FA (PRENATAL MULTIVITAMIN) TABS  tablet Take 1 tablet by mouth daily. (May buy from over the counter): Vitamin supplement 03/14/18   Armandina StammerNwoko, Agnes I, NP  traZODone (DESYREL) 50 MG tablet Take 1 tablet (50 mg total) by mouth at bedtime as needed for sleep. 03/13/18   Sanjuana KavaNwoko, Agnes I, NP    Family History Family History  Problem Relation Age of Onset  . Diabetes Maternal Aunt   . Diabetes Maternal Grandmother   . Cancer Maternal Grandmother   . Breast cancer Other     Social History Social History   Tobacco Use  . Smoking status: Current Every Day Smoker    Packs/day: 0.25    Types: Cigars  . Smokeless tobacco: Never Used  . Tobacco comment: black and mild  Substance Use Topics  . Alcohol use: Yes    Comment: socially  . Drug use: Yes    Types: Marijuana, MDMA  (Ecstacy)     Allergies   Lactose intolerance (gi) and Tape   Review of Systems Review of Systems  Constitutional: Positive for fatigue and fever.  HENT: Positive for sore throat. Negative for trouble swallowing and voice change.   Respiratory: Negative for cough.   Ten systems reviewed and are negative for acute change, except as noted in the HPI.    Physical Exam Updated Vital Signs BP (!) 145/88 (BP Location: Right Arm)   Pulse (!) 105   Temp (!) 101.3 F (38.5 C) (Oral)   Resp 16   SpO2 100%   Physical Exam Vitals signs and nursing note reviewed.  Constitutional:      General: She is not in acute distress.    Appearance: She is well-developed. She is not diaphoretic.     Comments: Obese female. Nontoxic appearing.  HENT:     Head: Normocephalic and atraumatic.     Mouth/Throat:     Mouth: Mucous membranes are moist.     Pharynx: Posterior oropharyngeal erythema present.     Comments: Bilateral tonsillar enlargement with minimal exudate. Uvula midline. Normal phonation. Tolerating secretions without difficulty. Eyes:     General: No scleral icterus.    Conjunctiva/sclera: Conjunctivae normal.  Neck:     Musculoskeletal: Normal range of motion.     Comments: No meningismus Cardiovascular:     Rate and Rhythm: Regular rhythm. Tachycardia present.     Pulses: Normal pulses.     Comments: 110bpm; likely 2/2 fever of 101.48F on my assessment. Pulmonary:     Effort: Pulmonary effort is normal. No respiratory distress.     Comments: Respirations even and unlabored Musculoskeletal: Normal range of motion.  Lymphadenopathy:     Cervical: Cervical adenopathy (tender tonsilar adenopathy) present.  Skin:    General: Skin is warm and dry.     Coloration: Skin is not pale.     Findings: No erythema or rash.  Neurological:     Mental Status: She is alert and oriented to person, place, and time.     Coordination: Coordination normal.     Comments: GCS 15. Patient moving  all extremities spontaneously.  Psychiatric:        Behavior: Behavior normal.      ED Treatments / Results  Labs (all labs ordered are listed, but only abnormal results are displayed) Labs Reviewed  GROUP A STREP BY PCR - Abnormal; Notable for the following components:      Result Value   Group A Strep by PCR DETECTED (*)    All other components within normal limits  EKG None  Radiology No results found.  Procedures Procedures (including critical care time)  Medications Ordered in ED Medications  ibuprofen (ADVIL) tablet 800 mg (800 mg Oral Given 07/31/18 2218)  penicillin g benzathine (BICILLIN LA) 1200000 UNIT/2ML injection 1.2 Million Units (1.2 Million Units Intramuscular Given 07/31/18 2219)  alum & mag hydroxide-simeth (MAALOX/MYLANTA) 200-200-20 MG/5ML suspension 30 mL (30 mLs Oral Given 07/31/18 2219)    And  lidocaine (XYLOCAINE) 2 % viscous mouth solution 15 mL (15 mLs Oral Given 07/31/18 2219)     Initial Impression / Assessment and Plan / ED Course  I have reviewed the triage vital signs and the nursing notes.  Pertinent labs & imaging results that were available during my care of the patient were reviewed by me and considered in my medical decision making (see chart for details).        Patient febrile with tonsillar exudate, cervical lymphadenopathy, and dysphagia; diagnosis of strep. Treated in the ED with NSAIDs, Maalox/lidocaine and PCN IM. Presentation not concerning for PTA or infxn spread to soft tissue. No trismus or uvula deviation. Encouraged f/u with PCP. Return precautions discussed and provided. Patient discharged in stable condition with no unaddressed concerns.   Final Clinical Impressions(s) / ED Diagnoses   Final diagnoses:  Strep throat    ED Discharge Orders         Ordered    ibuprofen (ADVIL) 600 MG tablet  Every 6 hours PRN     07/31/18 2219    phenol (CHLORASEPTIC) 1.4 % LIQD  As needed     07/31/18 2219           Antonietta Breach, Hershal Coria 07/31/18 2248    Quintella Reichert, MD 08/01/18 [phone]

## 2018-07-31 NOTE — Discharge Instructions (Addendum)
You tested positive for strep throat today. You were treated with a Penicillin shot prior to discharge and do no require any additional antibiotics. ° °We advise 600mg ibuprofen every 6 hours as prescribed. You may alternate this with Tylenol, if desired, for additional pain and fever control. Drink plenty of fluids to prevent dehydration. Follow-up with your primary care doctor for recheck. You may return for new or concerning symptoms. °

## 2018-07-31 NOTE — ED Triage Notes (Signed)
Patient reports sore throat with mild dysphagia onset this week , respirations unlabored , airway intact .

## 2018-08-22 ENCOUNTER — Other Ambulatory Visit: Payer: Self-pay | Admitting: Family

## 2018-08-22 MED ORDER — ULIPRISTAL ACETATE 30 MG PO TABS: 1.0000 | ORAL_TABLET | Freq: Once | ORAL | 0 refills | Status: AC

## 2018-09-20 ENCOUNTER — Telehealth: Payer: Self-pay

## 2018-09-20 DIAGNOSIS — Z3202 Encounter for pregnancy test, result negative: Secondary | ICD-10-CM

## 2018-09-20 NOTE — Telephone Encounter (Signed)
Orders placed for serum hcg.

## 2018-09-22 ENCOUNTER — Other Ambulatory Visit (INDEPENDENT_AMBULATORY_CARE_PROVIDER_SITE_OTHER): Payer: Medicaid Other

## 2018-09-22 ENCOUNTER — Other Ambulatory Visit: Payer: Self-pay

## 2018-09-22 DIAGNOSIS — Z3202 Encounter for pregnancy test, result negative: Secondary | ICD-10-CM | POA: Diagnosis not present

## 2018-09-22 DIAGNOSIS — N898 Other specified noninflammatory disorders of vagina: Secondary | ICD-10-CM

## 2018-09-22 DIAGNOSIS — Z113 Encounter for screening for infections with a predominantly sexual mode of transmission: Secondary | ICD-10-CM

## 2018-09-23 LAB — HCG, QUANTITATIVE, PREGNANCY: HCG, Total, QN: 3 m[IU]/mL

## 2018-09-23 LAB — C. TRACHOMATIS/N. GONORRHOEAE RNA
C. trachomatis RNA, TMA: NOT DETECTED
N. gonorrhoeae RNA, TMA: NOT DETECTED

## 2018-09-24 LAB — WET PREP BY MOLECULAR PROBE
Candida species: NOT DETECTED
MICRO NUMBER:: 823500
SPECIMEN QUALITY:: ADEQUATE
Trichomonas vaginosis: NOT DETECTED

## 2018-10-03 ENCOUNTER — Other Ambulatory Visit: Payer: Self-pay | Admitting: Family

## 2018-10-03 MED ORDER — METRONIDAZOLE 500 MG PO TABS: 500.0000 mg | ORAL_TABLET | Freq: Two times a day (BID) | ORAL | 0 refills | Status: DC

## 2018-10-03 MED ORDER — VALACYCLOVIR HCL 500 MG PO TABS: 500.0000 mg | ORAL_TABLET | Freq: Two times a day (BID) | ORAL | 0 refills | Status: AC

## 2018-10-11 ENCOUNTER — Other Ambulatory Visit: Payer: Self-pay

## 2018-10-11 ENCOUNTER — Ambulatory Visit (INDEPENDENT_AMBULATORY_CARE_PROVIDER_SITE_OTHER): Payer: Medicaid Other | Admitting: Family

## 2018-10-11 DIAGNOSIS — N946 Dysmenorrhea, unspecified: Secondary | ICD-10-CM

## 2018-10-11 DIAGNOSIS — J029 Acute pharyngitis, unspecified: Secondary | ICD-10-CM

## 2018-10-11 NOTE — Progress Notes (Signed)
Virtual Visit via Video Note  I connected with [name] 's patient  on 10/11/18 at  4:30 PM EDT by a video enabled telemedicine application and verified that I am speaking with the correct person using two identifiers.   Location of patient/parent: at home   I discussed the limitations of evaluation and management by telemedicine and the availability of in person appointments.  I discussed that the purpose of this telehealth visit is to provide medical care while limiting exposure to the novel coronavirus.  The patient expressed understanding and agreed to proceed.  Reason for visit: Sore Throat  History of Present Illness:   1. Sore Throat: Scratchy and dry. Not too painful but is bothering her. Began hurting yesterday. No fever, cough, rhinorrhea, ear pain. No difficulty with swallowing, some pain with swallowing. Trying to keep drinking. Initially doing hot tea and cough drops. No vomiting or diarrhea. Has some nausea. No new rashes. Seasonal allergies, Zytrec daily (try to). Positive for myalgia. Body feels drained and heavy. No SOB, CP.   Sick contacts include her friend who said she did not feel good and her mom did not feel good. Was not wearing a mask.    2. Period was two weeks and one day late, but was having sharp cramps that would start where her ovaries are and radiated down her legs, tingling. After periods stopped it went away.    Observations/Objective:   Patient does not notice any redness in back of throat, no enlarged tonsils, uvula is midline, no exudate, no petechia on hard palate  No cervical lymphadenopathy  Well appearing in NAD, normal WOB  Assessment and Plan:   1. Sore throat  Patient is well appearing and in no distress. Symptoms consistent with viral pharyngitis vs COVID19. Tonsils are clear with no erythema, hypertrophy, or exudate, no fever, or lymphadenopathy to suggest bacterial pharyngitis, will not send for culture.  No fever, difficulty breathing,  excessive drooling, neck stiffness, trismus or uvula deviation to suggest RTA/peritonsillar abscess. - counseled on supportive care with ibuprofen/tylenol, chamomile tea, honey, throat lozenges, gargling with salt water, diet modifications - discussed maintenance of good hydration, signs of dehydration   - Novel Coronavirus, NAA ([facility])  2. Menstrual cramps Will monitor during next period and if continues will reach back out. Due to have Nexplanon placed but will have to wait till resolution of symptoms.    Follow Up Instructions: Nexplanon placement after resolution of illness/quarantine    I discussed the assessment and treatment plan with the patient and/or parent/guardian. They were provided an opportunity to ask questions and all were answered. They agreed with the plan and demonstrated an understanding of the instructions.   They were advised to call back or seek an in-person evaluation in the emergency room if the symptoms worsen or if the condition fails to improve as anticipated.  I spent 30 minutes on this telehealth visit inclusive of face-to-face video and care coordination time I was located remote during this encounter.  Dorcas Mcmurray, MD

## 2018-10-12 ENCOUNTER — Other Ambulatory Visit: Payer: Self-pay

## 2018-10-12 DIAGNOSIS — Z20822 Contact with and (suspected) exposure to covid-19: Secondary | ICD-10-CM

## 2018-10-14 LAB — NOVEL CORONAVIRUS, NAA: SARS-CoV-2, NAA: NOT DETECTED

## 2018-10-17 ENCOUNTER — Encounter: Payer: Self-pay | Admitting: Family

## 2018-10-17 NOTE — Progress Notes (Signed)
Supervising Provider Co-Signature  I reviewed with the resident the medical history and the resident's findings.  I discussed with the resident the patient's diagnosis and concur with the treatment plan as documented in the resident's note.  [name] M Ynez Eugenio, NP  

## 2018-10-23 ENCOUNTER — Other Ambulatory Visit: Payer: Self-pay | Admitting: Pediatrics

## 2018-10-23 DIAGNOSIS — Z7251 High risk heterosexual behavior: Secondary | ICD-10-CM

## 2018-10-23 MED ORDER — LEVONORGESTREL 1.5 MG PO TABS: 1.5000 mg | ORAL_TABLET | Freq: Once | ORAL | Status: DC

## 2018-10-26 ENCOUNTER — Other Ambulatory Visit: Payer: Self-pay

## 2018-10-26 ENCOUNTER — Encounter: Payer: Self-pay | Admitting: Family

## 2018-10-26 ENCOUNTER — Ambulatory Visit (INDEPENDENT_AMBULATORY_CARE_PROVIDER_SITE_OTHER): Payer: Medicaid Other | Admitting: Family

## 2018-10-26 VITALS — BP 118/82 | HR 72 | Ht 68.0 in | Wt 255.4 lb

## 2018-10-26 DIAGNOSIS — Z3202 Encounter for pregnancy test, result negative: Secondary | ICD-10-CM

## 2018-10-26 DIAGNOSIS — Z7251 High risk heterosexual behavior: Secondary | ICD-10-CM

## 2018-10-26 DIAGNOSIS — N946 Dysmenorrhea, unspecified: Secondary | ICD-10-CM | POA: Diagnosis not present

## 2018-10-26 DIAGNOSIS — Z30017 Encounter for initial prescription of implantable subdermal contraceptive: Secondary | ICD-10-CM

## 2018-10-26 LAB — POCT URINE PREGNANCY: Preg Test, Ur: NEGATIVE

## 2018-10-26 LAB — HCG, QUANTITATIVE, PREGNANCY: HCG, Total, QN: <3 m[IU]/mL

## 2018-10-26 MED ORDER — ETONOGESTREL 68 MG ~~LOC~~ IMPL
68.0000 mg | DRUG_IMPLANT | Freq: Once | SUBCUTANEOUS | Status: AC
  Administered 2018-10-26: 68 mg via SUBCUTANEOUS

## 2018-10-26 MED ORDER — ULIPRISTAL ACETATE 30 MG PO TABS
30.0000 mg | ORAL_TABLET | Freq: Once | ORAL | Status: AC
  Administered 2018-10-26: 30 mg via ORAL

## 2018-10-26 NOTE — Progress Notes (Signed)
History was provided by the patient.  [name] is a 18 y.o. female who is here for nexplanon insertion.   PCP confirmed? yes  Trude Mcburney, FNP  HPI:   -18 YO AFAB AIF, sexually active with female partner -here for nexplanon insertion  -tried IUD x 2 before, pills, currently not on a method -Ella given today per PCP due to unprotected sex within last 5 days  -no abdominal pain, pelvic pain; no dysuria, no dyspareunia  -had cramps with last period, v painful; hoping nexplanon will help  -safe in current relationship  Review of Systems  Constitutional: Negative for chills, fever and malaise/fatigue.  HENT: Negative for sore throat.   Respiratory: Negative for cough and shortness of breath.   Cardiovascular: Negative for chest pain and palpitations.  Gastrointestinal: Negative for abdominal pain, nausea and vomiting.  Genitourinary: Negative for dysuria and frequency.  Musculoskeletal: Negative for myalgias.  Skin: Negative for rash.  Neurological: Negative for dizziness and headaches.  Psychiatric/Behavioral: The patient is not nervous/anxious.       Patient Active Problem List   Diagnosis Date Noted  . Severe episode of recurrent major depressive disorder, without psychotic features ([place]) 10/10/2017  . Substance use disorder 06/08/2017  . Elevated blood pressure reading 04/13/2017  . 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 Hx of suicide attempt 03/19/2016  . PTSD (post-traumatic stress disorder) 03/19/2016  . Generalized anxiety disorder   . Suicidal ideation     Current Outpatient Medications on File Prior to Visit  Medication Sig Dispense Refill  . albuterol (PROVENTIL HFA;VENTOLIN HFA) 108 (90 Base) MCG/ACT inhaler Inhale 1 puff into the lungs every 6 (six) hours as needed for wheezing or shortness of breath.    . ARIPiprazole (ABILIFY) 2 MG tablet TK 1 T PO D    . hydrOXYzine (ATARAX/VISTARIL) 25 MG tablet Take 1 tablet (25 mg total) by mouth every 6 (six) hours as  needed for anxiety. 60 tablet 0  . lamoTRIgine (LAMICTAL) 100 MG tablet TK 1 T PO D    . prazosin (MINIPRESS) 5 MG capsule Take 1 capsule (5 mg total) by mouth at bedtime. For PTSD symptoms/nightmares 30 capsule 0  . traZODone (DESYREL) 50 MG tablet Take 1 tablet (50 mg total) by mouth at bedtime as needed for sleep. 30 tablet 0  . fluconazole (DIFLUCAN) 150 MG tablet Take 1 tablet today and 1 tablet 3 days from now (Patient not taking: Reported on 10/26/2018) 2 tablet 0  . fluticasone (FLONASE) 50 MCG/ACT nasal spray Place 2 sprays into both nostrils daily. For allergies 16 g 1  . ibuprofen (ADVIL) 600 MG tablet Take 1 tablet (600 mg total) by mouth every 6 (six) hours as needed. 30 tablet 0  . metroNIDAZOLE (FLAGYL) 500 MG tablet Take 1 tablet (500 mg total) by mouth 2 (two) times daily. (Patient not taking: Reported on 10/26/2018) 14 tablet 0  . phenol (CHLORASEPTIC) 1.4 % LIQD Use as directed 1 spray in the mouth or throat as needed for throat irritation / pain. (Patient not taking: Reported on 10/26/2018) 20 mL 0  . Prenatal Vit-Fe Fumarate-FA (PRENATAL MULTIVITAMIN) TABS tablet Take 1 tablet by mouth daily. (May buy from over the counter): Vitamin supplement (Patient not taking: Reported on 10/26/2018)     No current facility-administered medications on file prior to visit.     Allergies  Allergen Reactions  . Lactose Intolerance (Gi) Nausea And Vomiting and Other (See Comments)  . Tape Other (See Comments)  Slight skin irritation    Physical Exam:    Vitals:   10/26/18 1054  BP: 118/82  Pulse: 72  Weight: 255 lb 6.4 oz (115.8 kg)  Height: 5\' 8"  (1.727 m)    Growth percentile SmartLinks can only be used for patients less than 82 years old. No LMP recorded.  Physical Exam Vitals signs reviewed. Exam conducted with a chaperone present.  Eyes:     Extraocular Movements: Extraocular movements intact.     Pupils: Pupils are equal, round, and reactive to light.  Neck:      Musculoskeletal: Normal range of motion and neck supple.  Cardiovascular:     Rate and Rhythm: Normal rate.  Pulmonary:     Effort: Pulmonary effort is normal.  Musculoskeletal: Normal range of motion.        General: No swelling.  Skin:    General: Skin is warm and dry.     Capillary Refill: Capillary refill takes less than 2 seconds.     Findings: No rash.  Neurological:     General: No focal deficit present.     Mental Status: She is alert and oriented to person, place, and time.  Psychiatric:        Mood and Affect: Mood normal.      Assessment/Plan: 1. Dysmenorrhea -follow-up in one month by video to monitor symptoms -consider naproxen or ibuprofen   2. Encounter for initial prescription of Nexplanon See procedure note   3. Unprotected sexual intercourse -spoke with 26, FNP; discussed risk/benefits of ulipristal acetate and nexplanon in same day and efficacy; we both agree her risk of missing next appt for insertion is higher than risk of decreased efficacy.   - ulipristal acetate (ELLA) tablet 30 mg - Beta HCG, Quant  4. Pregnancy examination or test, negative result -negative, r/o by bhcg as above  - POCT urine pregnancy

## 2018-10-26 NOTE — Patient Instructions (Signed)
Follow-up  in 1 month. Schedule this appointment before you leave clinic today.  Congratulations on getting your Nexplanon placement!  Below is some important information about Nexplanon.  First remember that Nexplanon does not prevent sexually transmitted infections.  Condoms will help prevent sexually transmitted infections. The Nexplanon starts working 7 days after it was inserted.  There is a risk of getting pregnant if you have unprotected sex in those first 7 days after placement of the Nexplanon.  The Nexplanon lasts for 3 years but can be removed at any time.  You can become pregnant as early as 1 week after removal.  You can have a new Nexplanon put in after the old one is removed if you like.  It is not known whether Nexplanon is as effective in women who are very overweight because the studies did not include many overweight women.  Nexplanon interacts with some medications, including barbiturates, bosentan, carbamazepine, felbamate, griseofulvin, oxcarbazepine, phenytoin, rifampin, St. [name]'s wort, topiramate, HIV medicines.  Please alert your doctor if you are on any of these medicines.  Always tell other healthcare providers that you have a Nexplanon in your arm.  The Nexplanon was placed just under the skin.  Leave the outside bandage on for 24 hours.  Leave the smaller bandage on for 3-5 days or until it falls off on its own.  Keep the area clean and dry for 3-5 days. There is usually bruising or swelling at the insertion site for a few days to a week after placement.  If you see redness or pus draining from the insertion site, call us immediately.  Keep your user card with the date the implant was placed and the date the implant is to be removed.  The most common side effect is a change in your menstrual bleeding pattern.   This bleeding is generally not harmful to you but can be annoying.  Call or come in to see us if you have any concerns about the bleeding or if you have any  side effects or questions.    We will call you in 1 week to check in and we would like you to return to the clinic for a follow-up visit in 1 month.  You can call [facility] 24 hours a day with any questions or concerns.  There is always a nurse or doctor available to take your call.  Call 9-1-1 if you have a life-threatening emergency.  For anything else, please call us at [phone] before heading to the ER. 

## 2018-10-26 NOTE — Procedures (Signed)
Nexplanon Insertion  No contraindications for placement.  No liver disease, no unexplained vaginal bleeding, no h/o breast cancer, no h/o blood clots.  No LMP recorded.  UHCG: negative  Last Unprotected sex:  <5 days  Risks & benefits of Nexplanon discussed The nexplanon device was purchased and supplied by [facility]. Packaging instructions supplied to patient Consent form signed  The patient denies any allergies to anesthetics or antiseptics.  Procedure: Pt was placed in supine position. The left arm was flexed at the elbow and externally rotated so that her wrist was parallel to her ear The medial epicondyle of the left arm was identified The insertions site was marked 8 cm proximal to the medial epicondyle The insertion site was cleaned with Betadine The area surrounding the insertion site was covered with a sterile drape 1% lidocaine was injected just under the skin at the insertion site extending 4 cm proximally. The sterile preloaded disposable Nexaplanon applicator was removed from the sterile packaging The applicator needle was inserted at a 30 degree angle at 8 cm proximal to the medial epicondyle as marked The applicator was lowered to a horizontal position and advanced just under the skin for the full length of the needle The slider on the applicator was retracted fully while the applicator remained in the same position, then the applicator was removed. The implant was confirmed via palpation as being in position The implant position was demonstrated to the patient Pressure dressing was applied to the patient.  The patient was instructed to removed the pressure dressing in 24 hrs.  The patient was advised to move slowly from a supine to an upright position  The patient denied any concerns or complaints  The patient was instructed to schedule a follow-up appt in 1 month and to call sooner if any concerns.  The patient acknowledged agreement and understanding of the  plan.

## 2018-10-26 NOTE — Addendum Note (Signed)
Addended by: Rejeana Brock on: 10/26/2018 03:47 PM   Modules accepted: Orders

## 2018-11-03 ENCOUNTER — Ambulatory Visit: Payer: Medicaid Other | Admitting: *Deleted

## 2018-11-03 ENCOUNTER — Other Ambulatory Visit: Payer: Self-pay

## 2018-11-03 DIAGNOSIS — Z113 Encounter for screening for infections with a predominantly sexual mode of transmission: Secondary | ICD-10-CM

## 2018-11-04 LAB — WET PREP BY MOLECULAR PROBE
Candida species: DETECTED — AB
Gardnerella vaginalis: NOT DETECTED
MICRO NUMBER:: 974173
SPECIMEN QUALITY:: ADEQUATE
Trichomonas vaginosis: NOT DETECTED

## 2018-11-06 ENCOUNTER — Other Ambulatory Visit: Payer: Self-pay | Admitting: Pediatrics

## 2018-11-06 LAB — C. TRACHOMATIS/N. GONORRHOEAE RNA
C. trachomatis RNA, TMA: NOT DETECTED
N. gonorrhoeae RNA, TMA: NOT DETECTED

## 2018-11-06 LAB — HIV ANTIBODY (ROUTINE TESTING W REFLEX): HIV 1&2 Ab, 4th Generation: NONREACTIVE

## 2018-11-06 LAB — RPR: RPR Ser Ql: NONREACTIVE

## 2018-11-06 MED ORDER — FLUCONAZOLE 150 MG PO TABS: ORAL_TABLET | ORAL | 0 refills | Status: DC

## 2018-11-12 ENCOUNTER — Emergency Department (HOSPITAL_COMMUNITY)
Admission: EM | Admit: 2018-11-12 | Discharge: 2018-11-12 | Disposition: A | Discharge status: Home / Self Care | Payer: Medicaid Other | Attending: Emergency Medicine | Admitting: Emergency Medicine

## 2018-11-12 ENCOUNTER — Encounter (HOSPITAL_COMMUNITY): Payer: Self-pay | Admitting: Emergency Medicine

## 2018-11-12 ENCOUNTER — Emergency Department (HOSPITAL_COMMUNITY): Payer: Medicaid Other

## 2018-11-12 ENCOUNTER — Other Ambulatory Visit: Payer: Self-pay

## 2018-11-12 DIAGNOSIS — R110 Nausea: Secondary | ICD-10-CM | POA: Diagnosis not present

## 2018-11-12 DIAGNOSIS — Z79899 Other long term (current) drug therapy: Secondary | ICD-10-CM | POA: Insufficient documentation

## 2018-11-12 DIAGNOSIS — F17290 Nicotine dependence, other tobacco product, uncomplicated: Secondary | ICD-10-CM | POA: Diagnosis not present

## 2018-11-12 DIAGNOSIS — R1031 Right lower quadrant pain: Secondary | ICD-10-CM | POA: Diagnosis present

## 2018-11-12 LAB — COMPREHENSIVE METABOLIC PANEL
ALT: 31 U/L (ref 0–44)
AST: 31 U/L (ref 15–41)
Albumin: 4.3 g/dL (ref 3.5–5.0)
Alkaline Phosphatase: 57 U/L (ref 38–126)
Anion gap: 6 (ref 5–15)
BUN: 12 mg/dL (ref 6–20)
CO2: 24 mmol/L (ref 22–32)
Calcium: 9.1 mg/dL (ref 8.9–10.3)
Chloride: 110 mmol/L (ref 98–111)
Creatinine, Ser: 0.89 mg/dL (ref 0.44–1.00)
GFR calc Af Amer: >60 mL/min (ref >60)
GFR calc non Af Amer: >60 mL/min (ref >60)
Glucose, Bld: 101 mg/dL — ABNORMAL HIGH (ref 70–99)
Potassium: 4 mmol/L (ref 3.5–5.1)
Sodium: 140 mmol/L (ref 135–145)
Total Bilirubin: 0.7 mg/dL (ref 0.3–1.2)
Total Protein: 7.6 g/dL (ref 6.5–8.1)

## 2018-11-12 LAB — CBC
HCT: 38.2 % (ref 36.0–46.0)
Hemoglobin: 12.6 g/dL (ref 12.0–15.0)
MCH: 31.3 pg (ref 26.0–34.0)
MCHC: 33 g/dL (ref 30.0–36.0)
MCV: 95 fL (ref 80.0–100.0)
Platelets: 241 10*3/uL (ref 150–400)
RBC: 4.02 MIL/uL (ref 3.87–5.11)
RDW: 13.3 % (ref 11.5–15.5)
WBC: 7.6 10*3/uL (ref 4.0–10.5)
nRBC: 0 % (ref 0.0–0.2)

## 2018-11-12 LAB — URINALYSIS, ROUTINE W REFLEX MICROSCOPIC
Bilirubin Urine: NEGATIVE
Glucose, UA: NEGATIVE mg/dL
Hgb urine dipstick: NEGATIVE
Ketones, ur: NEGATIVE mg/dL
Leukocytes,Ua: NEGATIVE
Nitrite: NEGATIVE
Protein, ur: NEGATIVE mg/dL
Specific Gravity, Urine: >1.046 — ABNORMAL HIGH (ref 1.005–1.030)
pH: 5 (ref 5.0–8.0)

## 2018-11-12 LAB — LIPASE, BLOOD: Lipase: 22 U/L (ref 11–51)

## 2018-11-12 LAB — I-STAT BETA HCG BLOOD, ED (MC, WL, AP ONLY): I-stat hCG, quantitative: <5 m[IU]/mL (ref <5)

## 2018-11-12 MED ORDER — SODIUM CHLORIDE 0.9 % IV BOLUS
1000.0000 mL | Freq: Once | INTRAVENOUS | Status: AC
  Administered 2018-11-12: 1000 mL via INTRAVENOUS

## 2018-11-12 MED ORDER — ONDANSETRON 4 MG PO TBDP: 4.0000 mg | ORAL_TABLET | Freq: Three times a day (TID) | ORAL | 0 refills | Status: DC | PRN

## 2018-11-12 MED ORDER — MORPHINE SULFATE (PF) 4 MG/ML IV SOLN
4.0000 mg | Freq: Once | INTRAVENOUS | Status: AC
  Administered 2018-11-12: 4 mg via INTRAVENOUS
  Filled 2018-11-12: qty 1

## 2018-11-12 MED ORDER — SODIUM CHLORIDE (PF) 0.9 % IJ SOLN
INTRAMUSCULAR | Status: AC
  Administered 2018-11-12: 18:00:00
  Filled 2018-11-12: qty 50

## 2018-11-12 MED ORDER — IOHEXOL 300 MG/ML  SOLN
100.0000 mL | Freq: Once | INTRAMUSCULAR | Status: AC | PRN
  Administered 2018-11-12: 100 mL via INTRAVENOUS

## 2018-11-12 MED ORDER — ONDANSETRON HCL 4 MG/2ML IJ SOLN
4.0000 mg | Freq: Once | INTRAMUSCULAR | Status: AC
  Administered 2018-11-12: 4 mg via INTRAVENOUS
  Filled 2018-11-12: qty 2

## 2018-11-12 NOTE — ED Triage Notes (Signed)
Per pt, states she was at working and her lower abdomin started cramping-states she thought it might be her period starting or maybe she needed to have a BM but nothing happened and she is still having the pain on and off

## 2018-11-12 NOTE — ED Notes (Signed)
Urine specimen cup at bedside.

## 2018-11-12 NOTE — Discharge Instructions (Signed)
Return to the ED if he starts to develop worsening symptoms, develop a fever, shortness of breath, chest pain.

## 2018-11-12 NOTE — ED Notes (Signed)
Pt reports nausea after giving morphine. Pt given IV zofran.

## 2018-11-12 NOTE — ED Provider Notes (Signed)
[facility] DEPT Provider Note   CSN: [id number] Arrival date & time: 11/12/18  1457     History   Chief Complaint Chief Complaint  Patient presents with  . Abdominal Pain    HPI [name] is a 18 y.o. female with a past medical history of anxiety, who presents to ED for evaluation of right lower quadrant abdominal pain for the past 3 hours.  States that she was at work when she had gradual onset of cramping sensation in her right lower abdomen.  States that the pain has been constant but will intermittently radiate to out her entire abdomen.  She initially thought it was her menstrual cycle coming on or that she needed to have a bowel movement.  States that neither of these happen when she went to the bathroom.  She has not tried medications to help with her symptoms and came straight to the ED.  No history of similar symptoms in the past.  She does admit to decreased p.o. intake over the past day due to going out at night.  She denies prior abdominal surgeries.  Reports tobacco use, marijuana use and occasional alcohol use.  Denies any vaginal complaints, dysuria, hematuria, vomiting, sick contacts with similar symptoms, shortness of breath, chest pain. She is unsure if she could be pregnant.     HPI  Past Medical History:  Diagnosis Date  . Anxiety   . Headache(784.0)   . Hx of suicide attempt   . Major depressive disorder   . PTSD (post-traumatic stress disorder)     Patient Active Problem List   Diagnosis Date Noted  . Severe episode of recurrent major depressive disorder, without psychotic features ([place]) 10/10/2017  . Substance use disorder 06/08/2017  . Elevated blood pressure reading 04/13/2017  . 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 Hx of suicide attempt 03/19/2016  . PTSD (post-traumatic stress disorder) 03/19/2016  . Generalized anxiety disorder   . Suicidal ideation     History reviewed. No pertinent surgical history.   OB History     Gravida  1   Para  1   Term  1   Preterm  0   AB  0   Living  1     SAB  0   TAB  0   Ectopic  0   Multiple  0   Live Births  1            Home Medications    Prior to Admission medications   Medication Sig Start Date End Date Taking? Authorizing Provider  albuterol (PROVENTIL HFA;VENTOLIN HFA) 108 (90 Base) MCG/ACT inhaler Inhale 1 puff into the lungs every 6 (six) hours as needed for wheezing or shortness of breath. 03/13/18   Lindell Spar I, NP  ARIPiprazole (ABILIFY) 2 MG tablet TK 1 T PO D 06/16/18   [provider]  fluconazole (DIFLUCAN) 150 MG tablet Take 1 tablet today and 1 tablet 3 days from now 11/06/18   Jonathon Resides T, FNP  fluticasone [facility]) 50 MCG/ACT nasal spray Place 2 sprays into both nostrils daily. For allergies 03/13/18   Lindell Spar I, NP  hydrOXYzine (ATARAX/VISTARIL) 25 MG tablet Take 1 tablet (25 mg total) by mouth every 6 (six) hours as needed for anxiety. 03/13/18   Lindell Spar I, NP  ibuprofen (ADVIL) 600 MG tablet Take 1 tablet (600 mg total) by mouth every 6 (six) hours as needed. 07/31/18   Antonietta Breach, PA-C  lamoTRIgine (LAMICTAL) 100 MG  tablet TK 1 T PO D 06/16/18   [provider]  metroNIDAZOLE (FLAGYL) 500 MG tablet Take 1 tablet (500 mg total) by mouth 2 (two) times daily. Patient not taking: Reported on 10/26/2018 10/03/18   Georges Mouse, NP  ondansetron (ZOFRAN ODT) 4 MG disintegrating tablet Take 1 tablet (4 mg total) by mouth every 8 (eight) hours as needed for nausea or vomiting. 11/12/18   Luvenia Cranford, PA-C  phenol (CHLORASEPTIC) 1.4 % LIQD Use as directed 1 spray in the mouth or throat as needed for throat irritation / pain. Patient not taking: Reported on 10/26/2018 07/31/18   Antony Madura, PA-C  prazosin (MINIPRESS) 5 MG capsule Take 1 capsule (5 mg total) by mouth at bedtime. For PTSD symptoms/nightmares 03/13/18   Armandina Stammer I, NP  Prenatal Vit-Fe Fumarate-FA (PRENATAL MULTIVITAMIN) TABS tablet Take 1  tablet by mouth daily. (May buy from over the counter): Vitamin supplement Patient not taking: Reported on 10/26/2018 03/14/18   Armandina Stammer I, NP  traZODone (DESYREL) 50 MG tablet Take 1 tablet (50 mg total) by mouth at bedtime as needed for sleep. 03/13/18   Sanjuana Kava, NP    Family History Family History  Problem Relation Age of Onset  . Diabetes Maternal Aunt   . Diabetes Maternal Grandmother   . Cancer Maternal Grandmother   . Breast cancer Other     Social History Social History   Tobacco Use  . Smoking status: Current Every Day Smoker    Packs/day: 0.25    Types: Cigars  . Smokeless tobacco: Never Used  . Tobacco comment: black and mild  Substance Use Topics  . Alcohol use: Yes    Comment: socially  . Drug use: Yes    Types: Marijuana, MDMA (Ecstacy)     Allergies   Lactose intolerance (gi) and Tape   Review of Systems Review of Systems  Constitutional: Negative for appetite change, chills and fever.  HENT: Negative for ear pain, rhinorrhea, sneezing and sore throat.   Eyes: Negative for photophobia and visual disturbance.  Respiratory: Negative for cough, chest tightness, shortness of breath and wheezing.   Cardiovascular: Negative for chest pain and palpitations.  Gastrointestinal: Positive for abdominal pain. Negative for blood in stool, constipation, diarrhea, nausea and vomiting.  Genitourinary: Negative for dysuria, hematuria and urgency.  Musculoskeletal: Negative for myalgias.  Skin: Negative for rash.  Neurological: Negative for dizziness, weakness and light-headedness.     Physical Exam Updated Vital Signs BP 121/66   Pulse 73   Temp 98.6 F (37 C) (Oral)   Resp 19   LMP 10/26/2018   SpO2 100%   Physical Exam Vitals signs and nursing note reviewed.  Constitutional:      General: She is not in acute distress.    Appearance: She is well-developed. She is obese.  HENT:     Head: Normocephalic and atraumatic.     Nose: Nose normal.   Eyes:     General: No scleral icterus.       Left eye: No discharge.     Conjunctiva/sclera: Conjunctivae normal.  Neck:     Musculoskeletal: Normal range of motion and neck supple.  Cardiovascular:     Rate and Rhythm: Normal rate and regular rhythm.     Heart sounds: Normal heart sounds. No murmur. No friction rub. No gallop.   Pulmonary:     Effort: Pulmonary effort is normal. No respiratory distress.     Breath sounds: Normal breath sounds.  Abdominal:  General: Bowel sounds are normal. There is no distension.     Palpations: Abdomen is soft.     Tenderness: There is abdominal tenderness in the right lower quadrant. There is no guarding.  Musculoskeletal: Normal range of motion.  Skin:    General: Skin is warm and dry.     Findings: No rash.  Neurological:     Mental Status: She is alert.     Motor: No abnormal muscle tone.     Coordination: Coordination normal.      ED Treatments / Results  Labs (all labs ordered are listed, but only abnormal results are displayed) Labs Reviewed  COMPREHENSIVE METABOLIC PANEL - Abnormal; Notable for the following components:      Result Value   Glucose, Bld 101 (*)    All other components within normal limits  URINALYSIS, ROUTINE W REFLEX MICROSCOPIC - Abnormal; Notable for the following components:   Color, Urine STRAW (*)    Specific Gravity, Urine >1.046 (*)    All other components within normal limits  LIPASE, BLOOD  CBC  I-STAT BETA HCG BLOOD, ED ([facility], [facility], [facility] ONLY)    EKG None  Radiology Ct Abdomen Pelvis W Contrast  Result Date: 11/12/2018 CLINICAL DATA:  18 year old female with abdominal pain. Evaluate for potential acute appendicitis. EXAM: CT ABDOMEN AND PELVIS WITH CONTRAST TECHNIQUE: Multidetector CT imaging of the abdomen and pelvis was performed using the standard protocol following bolus administration of intravenous contrast. CONTRAST:  100mL OMNIPAQUE IOHEXOL 300 MG/ML  SOLN COMPARISON:  CT the abdomen and  pelvis 02/19/2018. FINDINGS: Lower chest: Unremarkable. Hepatobiliary: No suspicious cystic or solid hepatic lesions. No intra or extrahepatic biliary ductal dilatation. Gallbladder is normal in appearance. Pancreas: No pancreatic mass. No pancreatic ductal dilatation. No pancreatic or peripancreatic fluid collections or inflammatory changes. Spleen: Unremarkable. Adrenals/Urinary Tract: Bilateral kidneys and bilateral adrenal glands are normal in appearance. No hydroureteronephrosis. Urinary bladder is normal in appearance. Stomach/Bowel: Normal appearance of the stomach. No pathologic dilatation of small bowel or colon. Normal appendix. Vascular/Lymphatic: No significant atherosclerotic disease, aneurysm or dissection noted in the abdominal or pelvic vasculature. No lymphadenopathy noted in the abdomen or pelvis. Reproductive: Uterus and ovaries are unremarkable in appearance. Other: No significant volume of ascites.  No pneumoperitoneum. Musculoskeletal: There are no aggressive appearing lytic or blastic lesions noted in the visualized portions of the skeleton. IMPRESSION: 1. No acute findings are noted in the abdomen or pelvis to account for the patient's symptoms. Specifically, the appendix is normal. Electronically Signed   By: Trudie Reedaniel  Entrikin M.D.   On: 11/12/2018 17:50    Procedures Procedures (including critical care time)  Medications Ordered in ED Medications  sodium chloride 0.9 % bolus 1,000 mL (0 mLs Intravenous Stopped 11/12/18 1846)  morphine 4 MG/ML injection 4 mg (4 mg Intravenous Given 11/12/18 1649)  iohexol (OMNIPAQUE) 300 MG/ML solution 100 mL (100 mLs Intravenous Contrast Given 11/12/18 1726)  sodium chloride (PF) 0.9 % injection (  Given by Other 11/12/18 1749)  ondansetron (ZOFRAN) injection 4 mg (4 mg Intravenous Given 11/12/18 1852)     Initial Impression / Assessment and Plan / ED Course  I have reviewed the triage vital signs and the nursing notes.  Pertinent labs &  imaging results that were available during my care of the patient were reviewed by me and considered in my medical decision making (see chart for details).        18 year old female presents to the ED for evaluation of right lower  quadrant abdominal onset of cramping sensation in her right lower limb.  Was associated nausea.  On my exam there is tenderness palpation of the right lower quadrant without rebound or guarding.  She appears very anxious.  Denies any urinary symptoms or vaginal complaints.  She is concerned that she could be pregnant.  Lab work including CBC, CMP, lipase and hCG unremarkable.  Urinalysis is unremarkable.  CT abdomen pelvis with no acute findings.  Patient was given pain medication, antiemetics with complete resolution of her symptoms.  I did offer her pelvic exam and/or pelvic ultrasound to rule out pelvic pathology of her symptoms.  Patient declines and stating that she feels better, states that she is concerned that it could be due to anxiety.  Explained to patient this could be possible but then again reiterated risk and benefits of not having pelvic exam or ultrasound.  She continues to decline.  She is comfortable with discharge home and follow-up with PCP.  Patient is hemodynamically stable, in NAD, and able to ambulate in the ED. Evaluation does not show pathology that would require ongoing emergent intervention or inpatient treatment. I explained the diagnosis to the patient. Pain has been managed and has no complaints prior to discharge. Patient is comfortable with above plan and is stable for discharge at this time. All questions were answered prior to disposition. Strict return precautions for returning to the ED were discussed. Encouraged follow up with PCP.   An After Visit Summary was printed and given to the patient.   Portions of this note were generated with Scientist, clinical (histocompatibility and immunogenetics). Dictation errors may occur despite best attempts at proofreading.   Final  Clinical Impressions(s) / ED Diagnoses   Final diagnoses:  Nausea    ED Discharge Orders         Ordered    ondansetron (ZOFRAN ODT) 4 MG disintegrating tablet  Every 8 hours PRN     11/12/18 2007           Dietrich Pates, [place] 11/12/18 2008    Tegeler, Canary Brim, MD 11/13/18 0004

## 2018-11-19 ENCOUNTER — Emergency Department (HOSPITAL_COMMUNITY)
Admission: EM | Admit: 2018-11-19 | Discharge: 2018-11-19 | Disposition: A | Discharge status: Home / Self Care | Payer: Medicaid Other | Attending: Emergency Medicine | Admitting: Emergency Medicine

## 2018-11-19 ENCOUNTER — Other Ambulatory Visit: Payer: Self-pay

## 2018-11-19 ENCOUNTER — Emergency Department (HOSPITAL_COMMUNITY): Payer: Medicaid Other

## 2018-11-19 ENCOUNTER — Encounter (HOSPITAL_COMMUNITY): Payer: Self-pay

## 2018-11-19 DIAGNOSIS — F1610 Hallucinogen abuse, uncomplicated: Secondary | ICD-10-CM | POA: Diagnosis not present

## 2018-11-19 DIAGNOSIS — Z79899 Other long term (current) drug therapy: Secondary | ICD-10-CM | POA: Diagnosis not present

## 2018-11-19 DIAGNOSIS — F1210 Cannabis abuse, uncomplicated: Secondary | ICD-10-CM | POA: Diagnosis not present

## 2018-11-19 DIAGNOSIS — F17290 Nicotine dependence, other tobacco product, uncomplicated: Secondary | ICD-10-CM | POA: Insufficient documentation

## 2018-11-19 DIAGNOSIS — Z3046 Encounter for surveillance of implantable subdermal contraceptive: Secondary | ICD-10-CM

## 2018-11-19 DIAGNOSIS — M79602 Pain in left arm: Secondary | ICD-10-CM | POA: Diagnosis present

## 2018-11-19 DIAGNOSIS — Z975 Presence of (intrauterine) contraceptive device: Secondary | ICD-10-CM

## 2018-11-19 NOTE — ED Triage Notes (Signed)
Pt states that she is having pain in her left arm. Pt states she got a nexplanon placed 10/26/18. Pt states that when she sleeps on it she has pain and is concerned that it is broken.

## 2018-11-19 NOTE — ED Provider Notes (Signed)
[facility]-EMERGENCY DEPT Provider Note   CSN: [id number] Arrival date & time: 11/19/18  1327     History   Chief Complaint Chief Complaint  Patient presents with  . Arm Pain    HPI [name] is a 18 y.o. female presents to the ER for evaluation of pain to the left upper arm along Nexplanon.  This was inserted by primary care doctor in 10/1.  States for the last few days she has had intermittent, localized discomfort especially when she moves her arm around or presses directly on it.  No interventions.  She is afraid it is broken although denies any direct trauma.  No distal tingling, loss of sensation.  No skin redness warmth or bleeding.     HPI  Past Medical History:  Diagnosis Date  . Anxiety   . Headache(784.0)   . Hx of suicide attempt   . Major depressive disorder   . PTSD (post-traumatic stress disorder)     Patient Active Problem List   Diagnosis Date Noted  . Severe episode of recurrent major depressive disorder, without psychotic features (HCC) 10/10/2017  . Substance use disorder 06/08/2017  . Elevated blood pressure reading 04/13/2017  . 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 Hx of suicide attempt 03/19/2016  . PTSD (post-traumatic stress disorder) 03/19/2016  . Generalized anxiety disorder   . Suicidal ideation     History reviewed. No pertinent surgical history.   OB History    Gravida  1   Para  1   Term  1   Preterm  0   AB  0   Living  1     SAB  0   TAB  0   Ectopic  0   Multiple  0   Live Births  1            Home Medications    Prior to Admission medications   Medication Sig Start Date End Date Taking? Authorizing Provider  albuterol (PROVENTIL HFA;VENTOLIN HFA) 108 (90 Base) MCG/ACT inhaler Inhale 1 puff into the lungs every 6 (six) hours as needed for wheezing or shortness of breath. 03/13/18   Armandina Stammer I, NP  ARIPiprazole (ABILIFY) 2 MG tablet TK 1 T PO D 06/16/18   [provider]   fluconazole (DIFLUCAN) 150 MG tablet Take 1 tablet today and 1 tablet 3 days from now 11/06/18   Alfonso Ramus T, FNP  fluticasone [facility]) 50 MCG/ACT nasal spray Place 2 sprays into both nostrils daily. For allergies 03/13/18   Armandina Stammer I, NP  hydrOXYzine (ATARAX/VISTARIL) 25 MG tablet Take 1 tablet (25 mg total) by mouth every 6 (six) hours as needed for anxiety. 03/13/18   Armandina Stammer I, NP  ibuprofen (ADVIL) 600 MG tablet Take 1 tablet (600 mg total) by mouth every 6 (six) hours as needed. 07/31/18   Antony Madura, PA-C  lamoTRIgine (LAMICTAL) 100 MG tablet TK 1 T PO D 06/16/18   [provider]  metroNIDAZOLE (FLAGYL) 500 MG tablet Take 1 tablet (500 mg total) by mouth 2 (two) times daily. Patient not taking: Reported on 10/26/2018 10/03/18   Georges Mouse, NP  ondansetron (ZOFRAN ODT) 4 MG disintegrating tablet Take 1 tablet (4 mg total) by mouth every 8 (eight) hours as needed for nausea or vomiting. 11/12/18   Khatri, Hina, PA-C  phenol (CHLORASEPTIC) 1.4 % LIQD Use as directed 1 spray in the mouth or throat as needed for throat irritation / pain. Patient not taking:  Reported on 10/26/2018 07/31/18   Antonietta Breach, PA-C  prazosin (MINIPRESS) 5 MG capsule Take 1 capsule (5 mg total) by mouth at bedtime. For PTSD symptoms/nightmares 03/13/18   Lindell Spar I, NP  Prenatal Vit-Fe Fumarate-FA (PRENATAL MULTIVITAMIN) TABS tablet Take 1 tablet by mouth daily. (May buy from over the counter): Vitamin supplement Patient not taking: Reported on 10/26/2018 03/14/18   Lindell Spar I, NP  traZODone (DESYREL) 50 MG tablet Take 1 tablet (50 mg total) by mouth at bedtime as needed for sleep. 03/13/18   Encarnacion Slates, NP    Family History Family History  Problem Relation Age of Onset  . Diabetes Maternal Aunt   . Diabetes Maternal Grandmother   . Cancer Maternal Grandmother   . Breast cancer Other     Social History Social History   Tobacco Use  . Smoking status: Current Every Day Smoker     Packs/day: 0.25    Types: Cigars  . Smokeless tobacco: Never Used  . Tobacco comment: black and mild  Substance Use Topics  . Alcohol use: Yes    Comment: socially  . Drug use: Yes    Types: Marijuana, MDMA (Ecstacy)     Allergies   Lactose intolerance (gi) and Tape   Review of Systems Review of Systems  Skin:       Pain along the left medial upper arm along Nexplanon  All other systems reviewed and are negative.    Physical Exam Updated Vital Signs BP 120/81 (BP Location: Right Arm)   Pulse 95   Temp 98.6 F (37 C) (Oral)   Resp 18   LMP 09/26/2018   SpO2 100%   Physical Exam Constitutional:      Appearance: She is well-developed.  HENT:     Head: Normocephalic.     Nose: Nose normal.  Eyes:     General: Lids are normal.  Neck:     Musculoskeletal: Normal range of motion.  Cardiovascular:     Rate and Rhythm: Normal rate.     Comments: 1+ radial pulses bilaterally Pulmonary:     Effort: Pulmonary effort is normal. No respiratory distress.  Musculoskeletal: Normal range of motion.  Skin:    Comments:    Nexplanon linear device easily palpated in the left upper medial proximal arm, shape appears to be normal and intact.  No overlying skin erythema, edema, fluctuance, bleeding or ecchymosis.  Full range of motion of the left shoulder, elbow without pain.  No proximal/distal joint edema, erythema.  Neurological:     Mental Status: She is alert.     Comments: Sensation in the median, ulnar, radial nerve distribution the left upper extremity is intact.  5/5 hand grip in the left upper extremity.  Psychiatric:        Behavior: Behavior normal.      ED Treatments / Results  Labs (all labs ordered are listed, but only abnormal results are displayed) Labs Reviewed - No data to display  EKG None  Radiology Dg Humerus Left  Result Date: 11/19/2018 CLINICAL DATA:  Nexplanon issue EXAM: LEFT HUMERUS - 2+ VIEW COMPARISON:  None. FINDINGS: No fracture or  dislocation of left humerus. Included joint spaces are preserved. There is a linear radiopaque foreign body in the medial soft tissues of the distal upper arm, in keeping with reported Nexplanon implant. IMPRESSION: No fracture or dislocation of left humerus. There is a linear radiopaque foreign body in the medial soft tissues of the distal upper arm, in  keeping with reported Nexplanon implant. Electronically Signed   By: Lauralyn PrimesAlex  Bibbey M.D.   On: 11/19/2018 14:36    Procedures Ultrasound ED Soft Tissue  Date/Time: 11/19/2018 3:39 PM Performed by: Liberty HandyGibbons, Claudia J, PA-C Authorized by: Liberty HandyGibbons, Claudia J, PA-C   Procedure details:    Indications: limb pain and evaluate for foreign body     Transverse view:  Visualized   Longitudinal view:  Visualized   Images: archived     Limitations:  Body habitus Location:    Location: upper extremity     Side:  Left Findings:     no abscess present    no cellulitis present    foreign body present   (including critical care time)  Medications Ordered in ED Medications - No data to display   Initial Impression / Assessment and Plan / ED Course  I have reviewed the triage vital signs and the nursing notes.  Pertinent labs & imaging results that were available during my care of the patient were reviewed by me and considered in my medical decision making (see chart for details).  Exam is reassuring.  Palpation confirms single linear firm foreign body consistent with Nexplanon.  No overlying signs of cellulitis, wound, ecchymosis.  Compartments are soft.  Extremities neurovascularly intact.  Distal and proximal joints normal.  Triage ordered x-ray that confirms Nexplanon placement but no other abnormalities.  Bedside ultrasound done by me confirms linear foreign body and no surrounding cobblestoning, fluid collection to suggest cellulitis or abscess.  Reassured patient Nexplanon is intact and there is no indication for emergent removal here.  She is  encouraged to follow-up with PCP who inserted Nexplanon earlier this month for further discussion.  Recommended Tylenol, ibuprofen, ice for symptomatic management.  Ultimately may need this removed if she cannot tolerated and opted for another birth control method.  Return precautions discussed.  She is comfortable with this.  Final Clinical Impressions(s) / ED Diagnoses   Final diagnoses:  Nexplanon in place  Left arm pain    ED Discharge Orders    None       Liberty HandyGibbons, Claudia J, PA-C 11/19/18 1546    Mancel BaleWentz, Elliott, MD 11/20/18 1416

## 2018-11-19 NOTE — Discharge Instructions (Addendum)
You were seen in the ER for pain along your Nexplanon.  Ultrasound revealed device is intact without surrounding signs of infection or inflammation. X-ray confirmed this as well.  We are unable to remove this device in the ER.  This needs to be done by your primary care doctor or OB/GYN.  Call them tomorrow and make an appointment to discuss possible removal and other birth control methods.  Return to the ER for redness warmth bleeding or pus from the skin, loss of sensation or weakness to your extremity.  Can try ibuprofen or acetaminophen, ice as needed.

## 2018-11-27 ENCOUNTER — Ambulatory Visit: Payer: Self-pay | Admitting: Pediatrics

## 2018-11-29 ENCOUNTER — Telehealth: Payer: Self-pay | Admitting: Pediatrics

## 2018-11-29 NOTE — Telephone Encounter (Signed)
Pre-screening for onsite visit  1. Who is bringing the patient to the visit? Self  Informed only one adult can bring patient to the visit to limit possible exposure to COVID19 and facemasks must be worn while in the building by the patient (ages 2 and older) and adult.  2. Has the person bringing the patient or the patient been around anyone with suspected or confirmed COVID-19 in the last 14 days?No   3. Has the person bringing the patient or the patient been around anyone who has been tested for COVID-19 in the last 14 days?No  4. Has the person bringing the patient or the patient had any of these symptoms in the last 14 days? No   Fever (temp 100 F or higher) Breathing problems Cough Sore throat Body aches Chills Vomiting Diarrhea   If all answers are negative, advise patient to call our office prior to your appointment if you or the patient develop any of the symptoms listed above.   If any answers are yes, cancel in-office visit and schedule the patient for a same day telehealth visit with a provider to discuss the next steps.  

## 2018-11-30 ENCOUNTER — Ambulatory Visit: Payer: Medicaid Other | Admitting: Family

## 2018-12-07 ENCOUNTER — Ambulatory Visit: Payer: Medicaid Other | Admitting: Family

## 2018-12-27 ENCOUNTER — Encounter (HOSPITAL_COMMUNITY): Payer: Self-pay

## 2018-12-27 ENCOUNTER — Inpatient Hospital Stay (HOSPITAL_COMMUNITY)
Admission: AD | Admit: 2018-12-27 | Discharge: 2018-12-30 | DRG: 885 | Disposition: A | Discharge status: Home / Self Care | Payer: Medicaid Other | Source: Intra-hospital | Attending: Psychiatry | Admitting: Psychiatry

## 2018-12-27 ENCOUNTER — Other Ambulatory Visit: Payer: Self-pay

## 2018-12-27 DIAGNOSIS — J45909 Unspecified asthma, uncomplicated: Secondary | ICD-10-CM | POA: Diagnosis present

## 2018-12-27 DIAGNOSIS — G4700 Insomnia, unspecified: Secondary | ICD-10-CM | POA: Diagnosis present

## 2018-12-27 DIAGNOSIS — R45851 Suicidal ideations: Secondary | ICD-10-CM | POA: Diagnosis present

## 2018-12-27 DIAGNOSIS — F332 Major depressive disorder, recurrent severe without psychotic features: Secondary | ICD-10-CM | POA: Diagnosis present

## 2018-12-27 DIAGNOSIS — Z20828 Contact with and (suspected) exposure to other viral communicable diseases: Secondary | ICD-10-CM | POA: Diagnosis present

## 2018-12-27 DIAGNOSIS — F411 Generalized anxiety disorder: Secondary | ICD-10-CM | POA: Diagnosis present

## 2018-12-27 DIAGNOSIS — F17210 Nicotine dependence, cigarettes, uncomplicated: Secondary | ICD-10-CM | POA: Diagnosis present

## 2018-12-27 DIAGNOSIS — F4310 Post-traumatic stress disorder, unspecified: Secondary | ICD-10-CM | POA: Diagnosis present

## 2018-12-27 DIAGNOSIS — F603 Borderline personality disorder: Secondary | ICD-10-CM | POA: Diagnosis present

## 2018-12-27 LAB — SARS CORONAVIRUS 2 BY RT PCR (HOSPITAL ORDER, PERFORMED IN ~~LOC~~ HOSPITAL LAB): SARS Coronavirus 2: NEGATIVE

## 2018-12-27 MED ORDER — PRAZOSIN HCL 2 MG PO CAPS
2.0000 mg | ORAL_CAPSULE | Freq: Every day | ORAL | Status: DC
  Administered 2018-12-27: 2 mg via ORAL
  Filled 2018-12-27: qty 1
  Filled 2018-12-27: qty 2
  Filled 2018-12-27: qty 1

## 2018-12-27 MED ORDER — ARIPIPRAZOLE 5 MG PO TABS
5.0000 mg | ORAL_TABLET | Freq: Every day | ORAL | Status: DC
  Administered 2018-12-27 – 2018-12-30 (×4): 5 mg via ORAL
  Filled 2018-12-27 (×6): qty 1

## 2018-12-27 MED ORDER — ALUM & MAG HYDROXIDE-SIMETH 200-200-20 MG/5ML PO SUSP: 30.0000 mL | ORAL | Status: DC | PRN

## 2018-12-27 MED ORDER — TRAZODONE HCL 50 MG PO TABS
50.0000 mg | ORAL_TABLET | Freq: Every evening | ORAL | Status: DC | PRN
  Administered 2018-12-27: 50 mg via ORAL
  Filled 2018-12-27: qty 1

## 2018-12-27 MED ORDER — MAGNESIUM HYDROXIDE 400 MG/5ML PO SUSP: 30.0000 mL | Freq: Every day | ORAL | Status: DC | PRN

## 2018-12-27 MED ORDER — LAMOTRIGINE 25 MG PO TABS
25.0000 mg | ORAL_TABLET | Freq: Every day | ORAL | Status: DC
  Administered 2018-12-27 – 2018-12-28 (×2): 25 mg via ORAL
  Filled 2018-12-27 (×4): qty 1

## 2018-12-27 MED ORDER — ACETAMINOPHEN 325 MG PO TABS
650.0000 mg | ORAL_TABLET | Freq: Four times a day (QID) | ORAL | Status: DC | PRN
  Filled 2018-12-27: qty 2

## 2018-12-27 MED ORDER — HYDROXYZINE HCL 25 MG PO TABS: 25.0000 mg | ORAL_TABLET | Freq: Three times a day (TID) | ORAL | Status: DC | PRN
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
## 2018-12-27 NOTE — H&P (Signed)
Behavioral Health Medical Screening Exam  [name] is an 18 y.o. female with history of MDD, GAD, PTSD, and borderline personality disorder, presenting as a walk-in for depression with suicidal ideation. She reports stopping her medications one month ago (Abilify 5 mg daily, Lamictal 150 mg daily, Vistaril 25 mg PRN, trazodone 50 mg PRN, and Minipress 5 mg QHS). She states medications had been helpful, but she stopped them due to feeling better and "wanting to be normal." Since then, she reports increased depression with suicidal ideation to overdose on medications. For the last three days, she has not gotten out of bed. She tried to go to work yesterday but had to leave due to excessive crying. She is tearful and reporting SI.  Total Time spent with patient: 15 minutes  Psychiatric Specialty Exam: Physical Exam  Vitals reviewed. Constitutional: She is oriented to person, place, and time. She appears well-developed and well-nourished.  Cardiovascular: Normal rate.  Respiratory: Effort normal.  Neurological: She is alert and oriented to person, place, and time.    Review of Systems  Constitutional: Negative.   Respiratory: Negative for cough and shortness of breath.   Cardiovascular: Negative for chest pain.  Gastrointestinal: Negative for nausea and vomiting.  Neurological: Positive for headaches.  Psychiatric/Behavioral: Positive for depression, substance abuse (THC) and suicidal ideas. Negative for hallucinations. The patient is nervous/anxious. The patient does not have insomnia.     Blood pressure (!) 139/92, pulse 91, temperature 99.5 F (37.5 C), temperature source Oral, resp. rate 18, SpO2 100 %.There is no height or weight on file to calculate BMI.  General Appearance: Casual  Eye Contact:  Good  Speech:  Normal Rate  Volume:  Normal  Mood:  Anxious and Depressed  Affect:  Congruent and Tearful  Thought Process:  Coherent and Goal Directed  Orientation:  Full (Time, Place,  and Person)  Thought Content:  Logical  Suicidal Thoughts:  Yes.  with intent/plan thoughts of overdosing on medications  Homicidal Thoughts:  No  Memory:  Immediate;   Fair Recent;   Fair Remote;   Fair  Judgement:  Fair  Insight:  Fair  Psychomotor Activity:  Normal  Concentration: Concentration: Good and Attention Span: Fair  Recall:  [employer] of Knowledge:Fair  Language: Good  Akathisia:  No  Handed:  Right  AIMS (if indicated):     Assets:  Communication Skills Desire for Improvement Housing Resilience Social Support  Sleep:       Musculoskeletal: Strength & Muscle Tone: within normal limits Gait & Station: normal Patient leans: N/A  Blood pressure (!) 139/92, pulse 91, temperature 99.5 F (37.5 C), temperature source Oral, resp. rate 18, SpO2 100 %.  Recommendations:  Inpatient treatment. Based on my evaluation the patient does not appear to have an emergency medical condition.  Connye Burkitt, NP 12/27/2018, 12:35 PM

## 2018-12-27 NOTE — Tx Team (Signed)
Initial Treatment Plan 12/27/2018 8:53 PM [name] MLJ:[id number]    PATIENT STRESSORS: Financial difficulties Medication change or noncompliance Substance abuse   PATIENT STRENGTHS: Ability for insight Average or above average intelligence Capable of independent living Communication skills General fund of knowledge Physical Health Supportive family/friends Work skills   PATIENT IDENTIFIED PROBLEMS:   "get back on medications"  "feels like a burden and not going to get better"                 DISCHARGE CRITERIA:  Improved stabilization in mood, thinking, and/or behavior Reduction of life-threatening or endangering symptoms to within safe limits Verbal commitment to aftercare and medication compliance  PRELIMINARY DISCHARGE PLAN: Outpatient therapy Return to previous living arrangement  PATIENT/FAMILY INVOLVEMENT: This treatment plan has been presented to and reviewed with the patient, [name], and/or family member, .  The patient and family have been given the opportunity to ask questions and make suggestions.  Franciso Bend, RN 12/27/2018, 8:53 PM

## 2018-12-28 DIAGNOSIS — F332 Major depressive disorder, recurrent severe without psychotic features: Principal | ICD-10-CM

## 2018-12-28 LAB — TSH: TSH: 0.635 u[IU]/mL (ref 0.350–4.500)

## 2018-12-28 LAB — LIPID PANEL
Cholesterol: 160 mg/dL (ref 0–200)
HDL: 35 mg/dL — ABNORMAL LOW (ref >40)
LDL Cholesterol: 116 mg/dL — ABNORMAL HIGH (ref 0–99)
Total CHOL/HDL Ratio: 4.6 RATIO
Triglycerides: 43 mg/dL (ref <150)
VLDL: 9 mg/dL (ref 0–40)

## 2018-12-28 LAB — COMPREHENSIVE METABOLIC PANEL
ALT: 26 U/L (ref 0–44)
AST: 21 U/L (ref 15–41)
Albumin: 4.2 g/dL (ref 3.5–5.0)
Alkaline Phosphatase: 55 U/L (ref 38–126)
Anion gap: 9 (ref 5–15)
BUN: 12 mg/dL (ref 6–20)
CO2: 22 mmol/L (ref 22–32)
Calcium: 9 mg/dL (ref 8.9–10.3)
Chloride: 105 mmol/L (ref 98–111)
Creatinine, Ser: 0.88 mg/dL (ref 0.44–1.00)
GFR calc Af Amer: >60 mL/min (ref >60)
GFR calc non Af Amer: >60 mL/min (ref >60)
Glucose, Bld: 112 mg/dL — ABNORMAL HIGH (ref 70–99)
Potassium: 3.8 mmol/L (ref 3.5–5.1)
Sodium: 136 mmol/L (ref 135–145)
Total Bilirubin: 0.5 mg/dL (ref 0.3–1.2)
Total Protein: 7.3 g/dL (ref 6.5–8.1)

## 2018-12-28 LAB — RAPID URINE DRUG SCREEN, HOSP PERFORMED
Amphetamines: NOT DETECTED
Barbiturates: NOT DETECTED
Benzodiazepines: NOT DETECTED
Cocaine: NOT DETECTED
Opiates: NOT DETECTED
Tetrahydrocannabinol: POSITIVE — AB

## 2018-12-28 LAB — CBC
HCT: 41.9 % (ref 36.0–46.0)
Hemoglobin: 13.5 g/dL (ref 12.0–15.0)
MCH: 30.3 pg (ref 26.0–34.0)
MCHC: 32.2 g/dL (ref 30.0–36.0)
MCV: 93.9 fL (ref 80.0–100.0)
Platelets: 210 10*3/uL (ref 150–400)
RBC: 4.46 MIL/uL (ref 3.87–5.11)
RDW: 12.1 % (ref 11.5–15.5)
WBC: 6.1 10*3/uL (ref 4.0–10.5)
nRBC: 0 % (ref 0.0–0.2)

## 2018-12-28 LAB — URINALYSIS, ROUTINE W REFLEX MICROSCOPIC
Bilirubin Urine: NEGATIVE
Glucose, UA: NEGATIVE mg/dL
Ketones, ur: NEGATIVE mg/dL
Leukocytes,Ua: NEGATIVE
Nitrite: NEGATIVE
Protein, ur: NEGATIVE mg/dL
RBC / HPF: >50 RBC/hpf — ABNORMAL HIGH (ref 0–5)
Specific Gravity, Urine: 1.023 (ref 1.005–1.030)
pH: 6 (ref 5.0–8.0)

## 2018-12-28 LAB — HEMOGLOBIN A1C
Hgb A1c MFr Bld: 5.5 % (ref 4.8–5.6)
Mean Plasma Glucose: 111.15 mg/dL

## 2018-12-28 LAB — PREGNANCY, URINE: Preg Test, Ur: NEGATIVE

## 2018-12-28 LAB — ETHANOL: Alcohol, Ethyl (B): <10 mg/dL (ref <10)

## 2018-12-28 MED ORDER — LAMOTRIGINE 25 MG PO TABS
25.0000 mg | ORAL_TABLET | Freq: Two times a day (BID) | ORAL | Status: DC
  Administered 2018-12-28: 25 mg via ORAL
  Filled 2018-12-28 (×4): qty 1

## 2018-12-28 MED ORDER — NICOTINE 21 MG/24HR TD PT24
21.0000 mg | MEDICATED_PATCH | TRANSDERMAL | Status: DC
  Administered 2018-12-29: 21 mg via TRANSDERMAL
  Filled 2018-12-28 (×3): qty 1

## 2018-12-28 MED ORDER — PRAZOSIN HCL 2 MG PO CAPS
4.0000 mg | ORAL_CAPSULE | Freq: Every day | ORAL | Status: DC
  Administered 2018-12-28 – 2018-12-29 (×2): 4 mg via ORAL
  Filled 2018-12-28 (×3): qty 2

## 2018-12-28 MED ORDER — TRAZODONE HCL 100 MG PO TABS
200.0000 mg | ORAL_TABLET | Freq: Every evening | ORAL | Status: DC | PRN
  Filled 2018-12-28: qty 2
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
## 2018-12-28 NOTE — B[facility] Suicide Risk Assessment (Signed)
[place] INPATIENT:  Family/Significant Other Suicide Prevention Education  Suicide Prevention Education:  Patient Refusal for Family/Significant Other Suicide Prevention Education: The patient [name] has refused to provide written consent for family/significant other to be provided Family/Significant Other Suicide Prevention Education during admission and/or prior to discharge.  Physician notified.  SPE completed with pt, as pt refused to consent to family contact. SPI pamphlet provided to pt and pt was encouraged to share information with support network, ask questions, and talk about any concerns relating to SPE. Pt denies access to guns/firearms and verbalized understanding of information provided. Mobile Crisis information also provided to pt.   [name] 12/28/2018, 11:39 AM

## 2018-12-28 NOTE — BHH Group Notes (Signed)
Adult Psychoeducational Group Note  Date:  12/28/2018 Time:  10:23 PM  Group Topic/Focus:  Wrap-Up Group:   The focus of this group is to help patients review their daily goal of treatment and discuss progress on daily workbooks.  Participation Level:  Active  Participation Quality:  Intrusive, Redirectable and Supportive  Affect:  Appropriate  Cognitive:  Appropriate  Insight: Improving  Engagement in Group:  Distracting  Modes of Intervention:  Discussion, Education and Support  Additional Comments:  Pt attended and participated in wrap up group this evening and rated their day a 6/10. Pt has been relaxing throughout the day. Pt completed their goal, which was to not have impulsive decisions made.   Cristi Loron 12/28/2018, 10:23 PM

## 2018-12-28 NOTE — Progress Notes (Signed)
D:  Patient's self inventory sheet, patient has fair sleep, sleep medication not helpful.  Fair Appetite, low energy level, poor concentration.  Rated depression and hopeless 7, anxiety 9.  Denied withdrawals.  Denied SI.  Physical problems, nausea, blurred vision.  Physical pain, stomach, head, worst pain #4 in past 24 hours.  Goal is not being impulsive and not sleeping all day.   Plans to communicate.  Having period makes mental worse.  No discharge plans. A:  Medications administered per MD orders.  Emotional support and encouragement given patient. R:  Denied SI and HI while talking to nurse, contracts for safety.  Denied A/V hallucinations.  Safety maintained with 15 minute checks.
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
## 2018-12-29 LAB — PROLACTIN: Prolactin: 6.6 ng/mL (ref 4.8–23.3)

## 2018-12-29 MED ORDER — LAMOTRIGINE 25 MG PO TABS
50.0000 mg | ORAL_TABLET | Freq: Two times a day (BID) | ORAL | Status: DC
  Administered 2018-12-29 – 2018-12-30 (×3): 50 mg via ORAL
  Filled 2018-12-29 (×6): qty 2

## 2018-12-29 MED ORDER — TRAZODONE HCL 150 MG PO TABS
400.0000 mg | ORAL_TABLET | Freq: Every evening | ORAL | Status: DC | PRN
  Administered 2018-12-29: 400 mg via ORAL
  Filled 2018-12-29: qty 2

## 2018-12-29 NOTE — Progress Notes (Signed)
   12/29/18 0050  Psych Admission Type (Psych Patients Only)  Admission Status Voluntary  Psychosocial Assessment  Patient Complaints Anxiety  Eye Contact Fair  Facial Expression Animated  Affect Appropriate to circumstance  Speech Logical/coherent  Interaction Assertive  Motor Activity Other (Comment) (appropriate)  Appearance/Hygiene Unremarkable  Behavior Characteristics Cooperative  Mood Preoccupied;Anxious  Thought Process  Coherency WDL  Content Preoccupation  Delusions None reported or observed  Perception Hallucinations  Hallucination Auditory  Judgment Impaired  Confusion None  Danger to Self  Current suicidal ideation? Denies  Self-Injurious Behavior No self-injurious ideation or behavior indicators observed or expressed   Agreement Not to Harm Self Yes  Description of Agreement  (contracts for safety verbal)  Danger to Others  Danger to Others None reported or observed  D: Patient in dayroom interacting with peers. Pt reports she had a good day.  A: Medications administered as prescribed. Support and encouragement provided as needed.  R: Patient remains safe on the unit. Will continue to monitor for safety and stability.
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
## 2018-12-29 NOTE — Progress Notes (Signed)
Patient ID: [name], female   DOB: 2000-02-27, 18 y.o.   MRN: [id number]  [place] NOVEL CORONAVIRUS (COVID-19) DAILY CHECK-OFF SYMPTOMS - answer yes or no to each - every day NO YES  Have you had a fever in the past 24 hours?  . Fever (Temp > 37.80C / 100F) X   Have you had any of these symptoms in the past 24 hours? . New Cough .  Sore Throat  .  Shortness of Breath .  Difficulty Breathing .  Unexplained Body Aches   X   Have you had any one of these symptoms in the past 24 hours not related to allergies?   . Runny Nose .  Nasal Congestion .  Sneezing   X   If you have had runny nose, nasal congestion, sneezing in the past 24 hours, has it worsened?  X   EXPOSURES - check yes or no X   Have you traveled outside the state in the past 14 days?  X   Have you been in contact with someone with a confirmed diagnosis of COVID-19 or PUI in the past 14 days without wearing appropriate PPE?  X   Have you been living in the same home as a person with confirmed diagnosis of COVID-19 or a PUI (household contact)?    X   Have you been diagnosed with COVID-19?    X              What to do next: Answered NO to all: Answered YES to anything:   Proceed with unit schedule Follow the [facility] Inpatient Flowsheet.

## 2018-12-29 NOTE — Progress Notes (Signed)
Recreation Therapy Notes  Date: 12.4.20 Time: 0930 Location: [address] Dayroom  Group Topic: Stress Management  Goal Area(s) Addresses:  Patient will identify positive stress management techniques. Patient will identify benefits of using stress management post d/c.  Intervention: Stress Management  Activity :  Meditation.  LRT played a meditation that focused on making the most of your day.  Patients were to listen and follow along with the meditation as it played to engage in the meditation.  Education:  Stress Management, Discharge Planning.   Education Outcome: Acknowledges Education  Clinical Observations/Feedback: Pt did not attend activity.    Victorino Sparrow, LRT/CTRS         Victorino Sparrow A 12/29/2018 11:14 AM

## 2018-12-29 NOTE — Progress Notes (Signed)
[place] Group Notes:  (Nursing/MHT/Case Management/Adjunct)  Date:  12/29/2018  Time:  2030  Type of Therapy:  wrap up group  Participation Level:  Active  Participation Quality:  Sharing  Affect:  Anxious, Appropriate and Excited  Cognitive:  Appropriate  Insight:  Improving  Engagement in Group:  Engaged  Modes of Intervention:  Clarification, Education and Support  Summary of Progress/Problems: Pt was talking to nurse during most of group. Pt joined group at end and shared and participated. Pt plans on staying on her meds after discharge. Pts favorite place is [place] and favorite sound is music. Pt is grateful for her 46 year old son.   Shellia Cleverly 12/29/2018, 10:23 PM

## 2018-12-29 NOTE — Progress Notes (Signed)
Adult Psychoeducational Group Note  Date:  12/29/2018 Time:  10:53 AM  Group Topic/Focus:  Recovery Goals:   The focus of this group is to identify appropriate goals for recovery and establish a plan to achieve them.  Participation Level:  Active  Participation Quality:  Appropriate  Affect:  Appropriate  Cognitive:  Alert  Insight: Appropriate  Engagement in Group:  Engaged  Modes of Intervention:  Discussion and Education  Additional Comments:    Pt participated in group with the MHT. Pt's received the daily packet. Today's topic is setbacks in recovery. MHT discussed readings and activities in the packet. Pt's were asked to determine their triggers and establish coping skills for those triggers.   Lita Mains 12/29/2018, 10:53 AM

## 2018-12-29 NOTE — [facility] Group Notes (Signed)
12/29/2018 8:45am Type of Group and Topic: Psychoeducational Group: Discharge Planning  Participation Level: Active  Description of Group Discharge planning group reviews patient's anticipated discharge plans and assists patients to anticipate and address any barriers to wellness/recovery in the community. Suicide prevention education is reviewed with patients in group. Therapeutic Goals 1. Patients will state their anticipated discharge plan and mental health aftercare 2. Patients will identify potential barriers to wellness in the community setting 3. Patients will engage in problem solving, solution focused discussion of ways to anticipate and address barriers to wellness/recovery   Summary of Patient Progress Plan for Discharge/Comments: [name] reports that she is nervous about returning home due to her history of non-compliance regarding her medications. [name] states that she plans to keep a medication log to help her manage her medications and to ensure she continues to take them.   Transportation Means: To be determined   Supports: To be determined    Therapeutic Modalities: Motivational Interviewing     [name], MSW, [place] Worker [facility]  Phone: [phone] 12/29/2018 3:03 PM

## 2018-12-29 NOTE — Progress Notes (Signed)
   12/29/18 2000  Psych Admission Type (Psych Patients Only)  Admission Status Voluntary  Psychosocial Assessment  Patient Complaints Anxiety  Eye Contact Fair  Facial Expression Animated  Affect Appropriate to circumstance  Speech Logical/coherent  Interaction Assertive  Motor Activity Other (Comment) (WNL)  Appearance/Hygiene Unremarkable  Behavior Characteristics Cooperative  Mood Pleasant  Thought Process  Coherency WDL  Content Preoccupation  Delusions None reported or observed  Perception WDL  Hallucination None reported or observed  Judgment Impaired  Confusion None  Danger to Self  Current suicidal ideation? Denies  Self-Injurious Behavior No self-injurious ideation or behavior indicators observed or expressed   Agreement Not to Harm Self Yes  Description of Agreement verbally contracts for safety  Danger to Others  Danger to Others None reported or observed

## 2018-12-29 NOTE — Tx Team (Signed)
Interdisciplinary Treatment and Diagnostic Plan Update  12/29/2018 Time of Session:  [name] MRN: [id number]  Principal Diagnosis: <principal problem not specified>  Secondary Diagnoses: Active Problems:   MDD (major depressive disorder), recurrent episode, severe (HCC)   Current Medications:  Current Facility-Administered Medications  Medication Dose Route Frequency Provider Last Rate Last Dose  . acetaminophen (TYLENOL) tablet 650 mg  650 mg Oral Q6H PRN Connye Burkitt, NP      . alum & mag hydroxide-simeth (MAALOX/MYLANTA) 200-200-20 MG/5ML suspension 30 mL  30 mL Oral Q4H PRN Connye Burkitt, NP      . ARIPiprazole (ABILIFY) tablet 5 mg  5 mg Oral Daily Connye Burkitt, NP   5 mg at 12/29/18 0751  . hydrOXYzine (ATARAX/VISTARIL) tablet 25 mg  25 mg Oral TID PRN Connye Burkitt, NP      . lamoTRIgine (LAMICTAL) tablet 50 mg  50 mg Oral BID Johnn Hai, MD   50 mg at 12/29/18 0756  . magnesium hydroxide (MILK OF MAGNESIA) suspension 30 mL  30 mL Oral Daily PRN Connye Burkitt, NP      . nicotine (NICODERM CQ - dosed in mg/24 hours) patch 21 mg  21 mg Transdermal Q24H Sharma Covert, MD   21 mg at 12/29/18 0752  . prazosin (MINIPRESS) capsule 4 mg  4 mg Oral QHS Johnn Hai, MD   4 mg at 12/28/18 2249  . traZODone (DESYREL) tablet 400 mg  400 mg Oral QHS PRN Johnn Hai, MD       PTA Medications: Medications Prior to Admission  Medication Sig Dispense Refill Last Dose  . albuterol (PROVENTIL HFA;VENTOLIN HFA) 108 (90 Base) MCG/ACT inhaler Inhale 1 puff into the lungs every 6 (six) hours as needed for wheezing or shortness of breath.     . ARIPiprazole (ABILIFY) 5 MG tablet Take 5 mg by mouth at bedtime.     . fluticasone (FLONASE) 50 MCG/ACT nasal spray Place 2 sprays into both nostrils daily. For allergies 16 g 1   . hydrOXYzine (ATARAX/VISTARIL) 25 MG tablet Take 1 tablet (25 mg total) by mouth every 6 (six) hours as needed for anxiety. 60 tablet 0   . lamoTRIgine (LAMICTAL)  100 MG tablet Take 100 mg by mouth daily.      . prazosin (MINIPRESS) 5 MG capsule Take 1 capsule (5 mg total) by mouth at bedtime. For PTSD symptoms/nightmares 30 capsule 0   . traZODone (DESYREL) 50 MG tablet Take 1 tablet (50 mg total) by mouth at bedtime as needed for sleep. 30 tablet 0   . fluconazole (DIFLUCAN) 150 MG tablet Take 1 tablet today and 1 tablet 3 days from now (Patient not taking: Reported on 12/28/2018) 2 tablet 0 Completed Course at Unknown time  . ibuprofen (ADVIL) 600 MG tablet Take 1 tablet (600 mg total) by mouth every 6 (six) hours as needed. (Patient not taking: Reported on 12/28/2018) 30 tablet 0 Completed Course at Unknown time  . metroNIDAZOLE (FLAGYL) 500 MG tablet Take 1 tablet (500 mg total) by mouth 2 (two) times daily. (Patient not taking: Reported on 10/26/2018) 14 tablet 0 Completed Course at Unknown time  . ondansetron (ZOFRAN ODT) 4 MG disintegrating tablet Take 1 tablet (4 mg total) by mouth every 8 (eight) hours as needed for nausea or vomiting. (Patient not taking: Reported on 12/28/2018) 3 tablet 0 Completed Course at Unknown time  . phenol (CHLORASEPTIC) 1.4 % LIQD Use as directed 1 spray in the mouth or  throat as needed for throat irritation / pain. (Patient not taking: Reported on 10/26/2018) 20 mL 0 Completed Course at Unknown time  . Prenatal Vit-Fe Fumarate-FA (PRENATAL MULTIVITAMIN) TABS tablet Take 1 tablet by mouth daily. (May buy from over the counter): Vitamin supplement (Patient not taking: Reported on 10/26/2018)   Not Taking at Unknown time    Patient Stressors: Financial difficulties Medication change or noncompliance Substance abuse  Patient Strengths: Ability for insight Average or above average intelligence Capable of independent living Communication skills General fund of knowledge Physical Health Supportive family/friends Work skills  Treatment Modalities: Medication Management, Group therapy, Case management,  1 to 1 session with  clinician, Psychoeducation, Recreational therapy.   Physician Treatment Plan for Primary Diagnosis: <principal problem not specified> Long Term Goal(s): Improvement in symptoms so as ready for discharge Improvement in symptoms so as ready for discharge   Short Term Goals: Ability to verbalize feelings will improve Ability to disclose and discuss suicidal ideas Ability to demonstrate self-control will improve Ability to identify and develop effective coping behaviors will improve Ability to maintain clinical measurements within normal limits will improve Ability to demonstrate self-control will improve Ability to maintain clinical measurements within normal limits will improve Compliance with prescribed medications will improve Ability to identify triggers associated with substance abuse/mental health issues will improve  Medication Management: Evaluate patient's response, side effects, and tolerance of medication regimen.  Therapeutic Interventions: 1 to 1 sessions, Unit Group sessions and Medication administration.  Evaluation of Outcomes: Progressing  Physician Treatment Plan for Secondary Diagnosis: Active Problems:   MDD (major depressive disorder), recurrent episode, severe ([place])  Long Term Goal(s): Improvement in symptoms so as ready for discharge Improvement in symptoms so as ready for discharge   Short Term Goals: Ability to verbalize feelings will improve Ability to disclose and discuss suicidal ideas Ability to demonstrate self-control will improve Ability to identify and develop effective coping behaviors will improve Ability to maintain clinical measurements within normal limits will improve Ability to demonstrate self-control will improve Ability to maintain clinical measurements within normal limits will improve Compliance with prescribed medications will improve Ability to identify triggers associated with substance abuse/mental health issues will improve      Medication Management: Evaluate patient's response, side effects, and tolerance of medication regimen.  Therapeutic Interventions: 1 to 1 sessions, Unit Group sessions and Medication administration.  Evaluation of Outcomes: Progressing   RN Treatment Plan for Primary Diagnosis: <principal problem not specified> Long Term Goal(s): Knowledge of disease and therapeutic regimen to maintain health will improve  Short Term Goals: Ability to participate in decision making will improve, Ability to verbalize feelings will improve, Ability to disclose and discuss suicidal ideas, Ability to identify and develop effective coping behaviors will improve and Compliance with prescribed medications will improve  Medication Management: RN will administer medications as ordered by provider, will assess and evaluate patient's response and provide education to patient for prescribed medication. RN will report any adverse and/or side effects to prescribing provider.  Therapeutic Interventions: 1 on 1 counseling sessions, Psychoeducation, Medication administration, Evaluate responses to treatment, Monitor vital signs and CBGs as ordered, Perform/monitor CIWA, COWS, AIMS and Fall Risk screenings as ordered, Perform wound care treatments as ordered.  Evaluation of Outcomes: Progressing   LCSW Treatment Plan for Primary Diagnosis: <principal problem not specified> Long Term Goal(s): Safe transition to appropriate next level of care at discharge, Engage patient in therapeutic group addressing interpersonal concerns.  Short Term Goals: Engage patient in aftercare planning with  referrals and resources  Therapeutic Interventions: Assess for all discharge needs, 1 to 1 time with Social worker, Explore available resources and support systems, Assess for adequacy in community support network, Educate family and significant other(s) on suicide prevention, Complete Psychosocial Assessment, Interpersonal group  therapy.  Evaluation of Outcomes: Not Met   Progress in Treatment: Attending groups: Yes. Participating in groups: Yes. Taking medication as prescribed: Yes. Toleration medication: Yes. Family/Significant other contact made: No, will contact:  no one, patient declined consent for collateral contact Patient understands diagnosis: Yes. Discussing patient identified problems/goals with staff: Yes. Medical problems stabilized or resolved: Yes. Denies suicidal/homicidal ideation: Yes. Issues/concerns per patient self-inventory: No. Other:   New problem(s) identified: None   New Short Term/Long Term Goal(s):medication stabilization, elimination of SI thoughts, development of comprehensive mental wellness plan.    Patient Goals:    Discharge Plan or Barriers: Patient plans to return home. She will continue to follow up with [facility] for medication management. CSW made a referral at the [facility] for potential therapy services. CSW will continue to follow for any additional referrals or discharge planning.   Reason for Continuation of Hospitalization: Anxiety Depression Medication stabilization  Estimated Length of Stay: 3-5 days   Attendees: Patient: 12/29/2018 9:32 AM  Physician: Dr. Johnn Hai, MD 12/29/2018 9:32 AM  Nursing: Elberta Fortis.A, RN 12/29/2018 9:32 AM  RN Care Manager: 12/29/2018 9:32 AM  Social Worker: [name], LCSW 12/29/2018 9:32 AM  Recreational Therapist:  12/29/2018 9:32 AM  Other:  12/29/2018 9:32 AM  Other:  12/29/2018 9:32 AM  Other: 12/29/2018 9:32 AM    Scribe for Treatment Team: Marylee Floras, [place] 12/29/2018 9:32 AM

## 2018-12-29 NOTE — Plan of Care (Signed)
  Problem: Activity: Goal: Interest or engagement in activities will improve Outcome: Progressing   Problem: Coping: Goal: Ability to demonstrate self-control will improve Outcome: Progressing   Problem: Safety: Goal: Periods of time without injury will increase Outcome: Progressing   

## 2018-12-30 MED ORDER — HYDROXYZINE HCL 25 MG PO TABS: 25.0000 mg | ORAL_TABLET | Freq: Three times a day (TID) | ORAL | 0 refills | Status: DC | PRN

## 2018-12-30 MED ORDER — LAMOTRIGINE 25 MG PO TABS: 50.0000 mg | ORAL_TABLET | Freq: Two times a day (BID) | ORAL | 0 refills | Status: DC

## 2018-12-30 MED ORDER — PRAZOSIN HCL 2 MG PO CAPS: 4.0000 mg | ORAL_CAPSULE | Freq: Every day | ORAL | 0 refills | Status: DC

## 2018-12-30 MED ORDER — TRAZODONE HCL 100 MG PO TABS: 400.0000 mg | ORAL_TABLET | Freq: Every evening | ORAL | 0 refills | Status: DC | PRN

## 2018-12-30 MED ORDER — ARIPIPRAZOLE 5 MG PO TABS: 5.0000 mg | ORAL_TABLET | Freq: Every day | ORAL | 0 refills | Status: DC

## 2018-12-30 MED ORDER — NICOTINE 21 MG/24HR TD PT24: 21.0000 mg | MEDICATED_PATCH | TRANSDERMAL | 0 refills | Status: DC
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
## 2018-12-30 NOTE — Progress Notes (Signed)
  [facility] Adult Case Management Discharge Plan :  Will you be returning to the same living situation after discharge:  Yes,  with boyfriend, per chart At discharge, do you have transportation home?: Yes,  grandmother Do you have the ability to pay for your medications: Yes, has Medicaid and a source of income  Release of information consent forms completed and Emailed to Medical Records, then turned in to Medical Records by CSW.   Patient to Follow up at: [place]. Go on 01/22/2019.   Why: Your medication management appointment is scheduled for 01/22/2019 at 10am. Please bring your hospital discharge paperwork and current medications. Wear a mask! Contact information: [address] [address] [place] [address] [place] Follow up on 01/01/2019.   Why: Your CST team will meet with you on 12/07 at 10am.  Contact information: [place] [address] [phone]        [facility] Follow up on 01/02/2019.   Why: Your therapy appointment with Caren Griffins is scheduled for 01/02/2019 at 3pm.  Contact information: [address] [place], [place] [address] Phone: [phone]   Fax:  [phone]            Next level of care provider has access to [place] and Suicide Prevention discussed: No.  Done only with patient, as she declined family contact  Have you used any form of tobacco in the last 30 days? (Cigarettes, Smokeless Tobacco, Cigars, and/or Pipes): Yes  Has patient been referred to the Quitline?: Patient refused referral  Patient has been referred for addiction treatment: Yes  Maretta Los, LCSW 12/30/2018, 8:43 AM

## 2018-12-30 NOTE — BHH Suicide Risk Assessment (Signed)
[facility] Discharge Suicide Risk Assessment   Principal Problem: <principal problem not specified> Discharge Diagnoses: Active Problems:   MDD (major depressive disorder), recurrent episode, severe ([place])   Total Time spent with patient: 15 minutes  Musculoskeletal: Strength & Muscle Tone: within normal limits Gait & Station: normal Patient leans: N/A  Psychiatric Specialty Exam: Review of Systems  All other systems reviewed and are negative.   Blood pressure (!) 131/58, pulse (!) 105, temperature 98.3 F (36.8 C), temperature source Oral, resp. rate 16, height 5\' 9"  (1.753 m), weight 113.4 kg, SpO2 100 %.Body mass index is 36.92 kg/m.  General Appearance: Casual  Eye Contact::  Good  Speech:  Normal Rate409  Volume:  Normal  Mood:  Euthymic  Affect:  Congruent  Thought Process:  Coherent and Descriptions of Associations: Intact  Orientation:  Full (Time, Place, and Person)  Thought Content:  Logical  Suicidal Thoughts:  No  Homicidal Thoughts:  No  Memory:  Immediate;   Fair Recent;   Fair Remote;   Fair  Judgement:  Intact  Insight:  Fair  Psychomotor Activity:  Normal  Concentration:  Good  Recall:  Good  Fund of Knowledge:Good  Language: Good  Akathisia:  Negative  Handed:  Right  AIMS (if indicated):     Assets:  Desire for Improvement Resilience Social Support  Sleep:  Number of Hours: 5.25  Cognition: WNL  ADL's:  Intact   Mental Status Per Nursing Assessment::   On Admission:  Self-harm thoughts  Demographic Factors:  Adolescent or young adult  Loss Factors: NA  Historical Factors: Impulsivity  Risk Reduction Factors:   Responsible for children under 65 years of age, Sense of responsibility to family, Employed and Positive social support  Continued Clinical Symptoms:  Depression:   Impulsivity  Cognitive Features That Contribute To Risk:  None    Suicide Risk:  Minimal: No identifiable suicidal ideation.  Patients presenting with no risk  factors but with morbid ruminations; may be classified as minimal risk based on the severity of the depressive symptoms  [facility]. Go on 01/22/2019.   Why: Your medication management appointment is scheduled for 01/22/2019 at 10am. Please bring your hospital discharge paperwork and current medications. Wear a mask! Contact information: [address] [address] [place] [address] [place] Follow up on 01/01/2019.   Why: Your CST team will meet with you on 12/07 at 10am.  Contact information: [place] [address] [phone]        [facility] Follow up on 01/02/2019.   Why: Your therapy appointment with Caren Griffins is scheduled for 01/02/2019 at 3pm.  Contact information: [address], [place], [place] [address] Phone: [phone]   Fax:  [phone]            Plan Of Care/Follow-up recommendations:  Activity:  ad lib  Sharma Covert, MD 12/30/2018, 7:51 AM

## 2018-12-30 NOTE — Progress Notes (Signed)
   12/30/18 1015  Psych Admission Type (Psych Patients Only)  Admission Status Voluntary  Psychosocial Assessment  Patient Complaints None  Eye Contact Fair  Facial Expression Other (Comment) (appropriate)  Affect Appropriate to circumstance  Speech Logical/coherent  Interaction Assertive  Motor Activity Other (Comment) (wdl)  Appearance/Hygiene Unremarkable  Behavior Characteristics Cooperative  Mood Pleasant

## 2018-12-30 NOTE — BHH Group Notes (Signed)
LCSW Adult Therapy Group Note  Date:  12/30/2018  Time:  10:00-11:00AM  Group Topic: Vignettes to Examine Healthy vs Unhealthy Coping Techniques  Participation Level:  Active  Description:    To determine what unhealthy coping techniques typically are used or and what healthy coping techniques would be helpful in coping with various problems. Patients were guided in becoming aware of the differences between healthy and unhealthy coping techniques.  Vignettes were used to generate ideas, which led to a discussion about personal application.     Therapeutic Goals 1. Patient will be able to tell whether a coping skill is healthy or unhealthy. 2. Patient will identify 1-2 healthy and 1-2 unhealthy coping skills they regularly use 3. Patient will discuss vignettes presented to determine unhealthy coping skills the hypothetical person might choose to use, and healthy coping skills that would be more helpful. 4. Patients will support each other.  Summary of Patient Progress:  The patient expressed current healthy coping skills employed include music and aromatherapy combined with positive affirmations, while current unhealthy coping skills often used are smoking cigarettes and taking her sleep medicines to oversleep when she wants to escape her feelings.  Patient participated fully and was insightful, sharing concepts shared at other times she has been in this group with CSW.  Therapeutic Modalities Activity Psychoeducation Processing    Selmer Dominion, LCSW 12/30/2018, 1:40 PM

## 2019-02-08 ENCOUNTER — Encounter: Payer: Self-pay | Admitting: Family

## 2019-02-08 ENCOUNTER — Ambulatory Visit (INDEPENDENT_AMBULATORY_CARE_PROVIDER_SITE_OTHER): Payer: Medicaid Other | Admitting: Family

## 2019-02-08 ENCOUNTER — Other Ambulatory Visit (HOSPITAL_COMMUNITY)
Admission: RE | Admit: 2019-02-08 | Discharge: 2019-02-08 | Disposition: A | Discharge status: Home / Self Care | Payer: Medicaid Other | Source: Ambulatory Visit | Attending: Family | Admitting: Family

## 2019-02-08 ENCOUNTER — Other Ambulatory Visit: Payer: Self-pay

## 2019-02-08 ENCOUNTER — Telehealth: Payer: Self-pay

## 2019-02-08 VITALS — BP 131/76 | HR 96 | Ht 69.29 in | Wt 257.6 lb

## 2019-02-08 DIAGNOSIS — N921 Excessive and frequent menstruation with irregular cycle: Secondary | ICD-10-CM | POA: Diagnosis not present

## 2019-02-08 DIAGNOSIS — Z3202 Encounter for pregnancy test, result negative: Secondary | ICD-10-CM

## 2019-02-08 DIAGNOSIS — Z113 Encounter for screening for infections with a predominantly sexual mode of transmission: Secondary | ICD-10-CM | POA: Insufficient documentation

## 2019-02-08 DIAGNOSIS — Z975 Presence of (intrauterine) contraceptive device: Secondary | ICD-10-CM

## 2019-02-08 NOTE — Telephone Encounter (Signed)
Patient asks for Valtrex to be sent to pharmacy on file. Routing to [facility] to prescribe.

## 2019-02-09 ENCOUNTER — Other Ambulatory Visit: Payer: Self-pay | Admitting: Pediatrics

## 2019-02-09 ENCOUNTER — Encounter: Payer: Self-pay | Admitting: Family

## 2019-02-09 LAB — HSV(HERPES SIMPLEX VRS) I + II AB-IGG
HAV 1 IGG,TYPE SPECIFIC AB: <0.9 index
HSV 2 IGG,TYPE SPECIFIC AB: 4.14 index — ABNORMAL HIGH

## 2019-02-09 LAB — HIV ANTIBODY (ROUTINE TESTING W REFLEX): HIV 1&2 Ab, 4th Generation: NONREACTIVE

## 2019-02-09 LAB — RPR: RPR Ser Ql: NONREACTIVE

## 2019-02-09 LAB — POCT URINE PREGNANCY: Preg Test, Ur: NEGATIVE

## 2019-02-09 MED ORDER — VALACYCLOVIR HCL 500 MG PO TABS: 500.0000 mg | ORAL_TABLET | Freq: Every day | ORAL | 6 refills | Status: AC

## 2019-02-09 NOTE — Progress Notes (Signed)
History was provided by the patient.  [name] is a 19 y.o. female who is here for breakthrough bleeding with nexplanon, STI screenings.   PCP confirmed? Yes.    [name], FNP  HPI:   -breakthrough bleeding with nexplanon  -insertion October  -10/28 LMP Then bleeding again 12/16-12/16 -sexually active with new female partner -concern for STI screenings; specifically HIV (ex BF is now taking meds for HIV)  -PMH significant for HSV genital; wants to know how this impacts her new partner -no fever, chills, pain, no fatigue or malaise; no slow healing wounds, no unexplained weight loss  Review of Systems  Constitutional: Negative for chills, fever and malaise/fatigue.  HENT: Negative for sore throat.   Eyes: Negative for blurred vision and double vision.  Respiratory: Negative for cough and sputum production.   Cardiovascular: Negative for chest pain and palpitations.  Gastrointestinal: Negative for abdominal pain and nausea.  Genitourinary: Negative for dysuria and frequency.  Musculoskeletal: Negative for joint pain and myalgias.  Skin: Negative for rash.  Neurological: Negative for dizziness, tingling and headaches.  Endo/Heme/Allergies: Does not bruise/bleed easily.  Psychiatric/Behavioral: The patient is nervous/anxious.      Patient Active Problem List   Diagnosis Date Noted  . MDD (major depressive disorder), recurrent episode, severe (HCC) 12/27/2018  . Severe episode of recurrent major depressive disorder, without psychotic features (HCC) 10/10/2017  . Substance use disorder 06/08/2017  . Elevated blood pressure reading 04/13/2017  . 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 Hx of suicide attempt 03/19/2016  . PTSD (post-traumatic stress disorder) 03/19/2016  . Generalized anxiety disorder   . Suicidal ideation     Current Outpatient Medications on File Prior to Visit  Medication Sig Dispense Refill  . albuterol (PROVENTIL HFA;VENTOLIN HFA) 108 (90 Base) MCG/ACT  inhaler Inhale 1 puff into the lungs every 6 (six) hours as needed for wheezing or shortness of breath.    . ARIPiprazole (ABILIFY) 5 MG tablet Take 1 tablet (5 mg total) by mouth daily. For mood control 30 tablet 0  . hydrOXYzine (ATARAX/VISTARIL) 25 MG tablet Take 1 tablet (25 mg total) by mouth 3 (three) times daily as needed for anxiety. 75 tablet 0  . lamoTRIgine (LAMICTAL) 25 MG tablet Take 2 tablets (50 mg total) by mouth 2 (two) times daily. For mood stabilization 120 tablet 0  . nicotine (NICODERM CQ - DOSED IN MG/24 HOURS) 21 mg/24hr patch Place 1 patch (21 mg total) onto the skin daily. (May buy from over the counter): For smoking cessation 1 patch 0  . prazosin (MINIPRESS) 2 MG capsule Take 2 capsules (4 mg total) by mouth at bedtime. For nightmares 60 capsule 0  . traZODone (DESYREL) 100 MG tablet Take 4 tablets (400 mg total) by mouth at bedtime as needed for sleep. 30 tablet 0   No current facility-administered medications on file prior to visit.    Allergies  Allergen Reactions  . Lactose Intolerance (Gi) Nausea And Vomiting and Other (See Comments)  . Tape Other (See Comments)    Slight skin irritation    Physical Exam:    Vitals:   02/08/19 0937  BP: 131/76  Pulse: 96  Weight: 257 lb 9.6 oz (116.8 kg)  Height: 5' 9.29" (1.76 m)    Growth percentile SmartLinks can only be used for patients less than 59 years old. No LMP recorded.  Physical Exam Vitals reviewed. Exam conducted with a chaperone present.  Constitutional:      Appearance: Normal appearance.  HENT:  Head: Normocephalic.     Mouth/Throat:     Mouth: Mucous membranes are moist.  Eyes:     Extraocular Movements: Extraocular movements intact.     Pupils: Pupils are equal, round, and reactive to light.  Cardiovascular:     Rate and Rhythm: Normal rate and regular rhythm.  Pulmonary:     Effort: Pulmonary effort is normal.  Abdominal:     General: There is no distension.  Musculoskeletal:         General: No swelling. Normal range of motion.     Cervical back: Normal range of motion and neck supple.  Skin:    General: Skin is warm and dry.     Findings: No rash.  Neurological:     General: No focal deficit present.     Mental Status: She is alert.  Psychiatric:        Mood and Affect: Mood normal.      Assessment/Plan: 1. Breakthrough bleeding on Nexplanon -will screen for infections and keep implant with plan to address again once infection results reviewed; discussed medication options for BTB (NSAIDs such as Mobic or COCs)  with patient; she was agreeable.   2. Routine screening for STI (sexually transmitted infection) -will call with results; will screen for HSV sero in order to have a more guided conversation of viral suppression options and partner education   - GC/Chlamydia [facility] - for urine and other sample types - HIV antibody (with reflex) - RPR - HSV(herpes simplex vrs) 1+2 ab-IgG  3. Pregnancy examination or test, negative result -negative  - POC Urine Pregnancy (dx code Z32.02)

## 2019-02-09 NOTE — Telephone Encounter (Signed)
Daily valtrex sent.

## 2019-02-11 ENCOUNTER — Encounter: Payer: Self-pay | Admitting: Family

## 2019-02-12 ENCOUNTER — Other Ambulatory Visit: Payer: Self-pay | Admitting: Pediatrics

## 2019-02-12 DIAGNOSIS — N760 Acute vaginitis: Secondary | ICD-10-CM

## 2019-02-12 DIAGNOSIS — B9689 Other specified bacterial agents as the cause of diseases classified elsewhere: Secondary | ICD-10-CM

## 2019-02-12 LAB — URINE CYTOLOGY ANCILLARY ONLY
Bacterial Vaginitis-Urine: POSITIVE — AB
Bacterial Vaginitis-Urine: POSITIVE — AB
Bacterial Vaginitis-Urine: POSITIVE — AB
Candida Urine: NEGATIVE
Chlamydia: NEGATIVE
Comment: NEGATIVE
Comment: NEGATIVE
Comment: NORMAL
Neisseria Gonorrhea: NEGATIVE
Trichomonas: NEGATIVE

## 2019-02-12 MED ORDER — METRONIDAZOLE 500 MG PO TABS: 500.0000 mg | ORAL_TABLET | Freq: Two times a day (BID) | ORAL | 0 refills | Status: AC

## 2019-02-19 ENCOUNTER — Emergency Department (HOSPITAL_COMMUNITY): Payer: Medicaid Other

## 2019-02-19 ENCOUNTER — Other Ambulatory Visit: Payer: Self-pay

## 2019-02-19 ENCOUNTER — Emergency Department (HOSPITAL_COMMUNITY)
Admission: EM | Admit: 2019-02-19 | Discharge: 2019-02-19 | Disposition: A | Discharge status: Home / Self Care | Payer: Medicaid Other | Attending: Emergency Medicine | Admitting: Emergency Medicine

## 2019-02-19 ENCOUNTER — Encounter (HOSPITAL_COMMUNITY): Payer: Self-pay | Admitting: *Deleted

## 2019-02-19 DIAGNOSIS — Z79899 Other long term (current) drug therapy: Secondary | ICD-10-CM | POA: Insufficient documentation

## 2019-02-19 DIAGNOSIS — R079 Chest pain, unspecified: Secondary | ICD-10-CM

## 2019-02-19 DIAGNOSIS — R0789 Other chest pain: Secondary | ICD-10-CM | POA: Insufficient documentation

## 2019-02-19 DIAGNOSIS — Z20822 Contact with and (suspected) exposure to covid-19: Secondary | ICD-10-CM | POA: Insufficient documentation

## 2019-02-19 DIAGNOSIS — F17210 Nicotine dependence, cigarettes, uncomplicated: Secondary | ICD-10-CM | POA: Diagnosis not present

## 2019-02-19 LAB — BASIC METABOLIC PANEL
Anion gap: 7 (ref 5–15)
BUN: 12 mg/dL (ref 6–20)
CO2: 27 mmol/L (ref 22–32)
Calcium: 9.3 mg/dL (ref 8.9–10.3)
Chloride: 106 mmol/L (ref 98–111)
Creatinine, Ser: 0.78 mg/dL (ref 0.44–1.00)
GFR calc Af Amer: >60 mL/min (ref >60)
GFR calc non Af Amer: >60 mL/min (ref >60)
Glucose, Bld: 112 mg/dL — ABNORMAL HIGH (ref 70–99)
Potassium: 3.8 mmol/L (ref 3.5–5.1)
Sodium: 140 mmol/L (ref 135–145)

## 2019-02-19 LAB — CBC
HCT: 43.3 % (ref 36.0–46.0)
Hemoglobin: 14.1 g/dL (ref 12.0–15.0)
MCH: 30.1 pg (ref 26.0–34.0)
MCHC: 32.6 g/dL (ref 30.0–36.0)
MCV: 92.5 fL (ref 80.0–100.0)
Platelets: 225 10*3/uL (ref 150–400)
RBC: 4.68 MIL/uL (ref 3.87–5.11)
RDW: 12.9 % (ref 11.5–15.5)
WBC: 9.8 10*3/uL (ref 4.0–10.5)
nRBC: 0 % (ref 0.0–0.2)

## 2019-02-19 LAB — I-STAT BETA HCG BLOOD, ED (MC, WL, AP ONLY): I-stat hCG, quantitative: <5 m[IU]/mL (ref <5)

## 2019-02-19 LAB — TROPONIN I (HIGH SENSITIVITY)
Troponin I (High Sensitivity): <2 ng/L (ref <18)
Troponin I (High Sensitivity): <2 ng/L (ref <18)

## 2019-02-19 LAB — POC SARS CORONAVIRUS 2 AG -  ED: SARS Coronavirus 2 Ag: NEGATIVE

## 2019-02-19 LAB — D-DIMER, QUANTITATIVE: D-Dimer, Quant: 0.38 ug/mL-FEU (ref 0.00–0.50)

## 2019-02-19 MED ORDER — SODIUM CHLORIDE 0.9% FLUSH: 3.0000 mL | Freq: Once | INTRAVENOUS | Status: DC

## 2019-02-19 NOTE — ED Triage Notes (Signed)
Mid chest pain for about 2 days, pressure, some nausea, worse when lies down.

## 2019-02-19 NOTE — ED Provider Notes (Signed)
[facility]-EMERGENCY DEPT Provider Note   CSN: [id number] Arrival date & time: 02/19/19  1543     History Chief Complaint  Patient presents with  . Chest Pain    [name] is a 19 y.o. female.  0 chief complaint chest pain.  Patient states for last 2 or 3 days she has noted mood, central chest pain, describes as pressure, burning sensation.  Worse with lying flat.  Worse with since moving from left to right.  No associated shortness of breath, has had some nausea, no cough, no vomiting.  Denies prior history of DVT, PE, denies coronary artery disease family history.  On Nexplanon for birth control, has had irregular periods with this.  No new leg pain.  No new leg swelling.  HPI     Past Medical History:  Diagnosis Date  . Anxiety   . Headache(784.0)   . Hx of suicide attempt   . Major depressive disorder   . PTSD (post-traumatic stress disorder)     Patient Active Problem List   Diagnosis Date Noted  . MDD (major depressive disorder), recurrent episode, severe (HCC) 12/27/2018  . Severe episode of recurrent major depressive disorder, without psychotic features (HCC) 10/10/2017  . Substance use disorder 06/08/2017  . Elevated blood pressure reading 04/13/2017  . 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 Hx of suicide attempt 03/19/2016  . PTSD (post-traumatic stress disorder) 03/19/2016  . Generalized anxiety disorder   . Suicidal ideation     History reviewed. No pertinent surgical history.   OB History    Gravida  1   Para  1   Term  1   Preterm  0   AB  0   Living  1     SAB  0   TAB  0   Ectopic  0   Multiple  0   Live Births  1           Family History  Problem Relation Age of Onset  . Diabetes Maternal Aunt   . Diabetes Maternal Grandmother   . Cancer Maternal Grandmother   . Breast cancer Other     Social History   Tobacco Use  . Smoking status: Current Every Day Smoker    Packs/day: 0.50    Types: Cigarettes,  Cigars  . Smokeless tobacco: Never Used  . Tobacco comment: black and mild  Substance Use Topics  . Alcohol use: Yes    Comment: socially  . Drug use: Yes    Types: Marijuana, MDMA (Ecstacy), Cocaine    Home Medications Prior to Admission medications   Medication Sig Start Date End Date Taking? Authorizing Provider  albuterol (PROVENTIL HFA;VENTOLIN HFA) 108 (90 Base) MCG/ACT inhaler Inhale 1 puff into the lungs every 6 (six) hours as needed for wheezing or shortness of breath. 03/13/18   Armandina Stammer I, NP  ARIPiprazole (ABILIFY) 5 MG tablet Take 1 tablet (5 mg total) by mouth daily. For mood control 12/31/18   Armandina Stammer I, NP  hydrOXYzine (ATARAX/VISTARIL) 25 MG tablet Take 1 tablet (25 mg total) by mouth 3 (three) times daily as needed for anxiety. 12/30/18   Armandina Stammer I, NP  lamoTRIgine (LAMICTAL) 25 MG tablet Take 2 tablets (50 mg total) by mouth 2 (two) times daily. For mood stabilization 12/30/18   Nwoko, Nicole Kindred I, NP  nicotine (NICODERM CQ - DOSED IN MG/24 HOURS) 21 mg/24hr patch Place 1 patch (21 mg total) onto the skin daily. (May buy from over the  counter): For smoking cessation 12/30/18   Lindell Spar I, NP  prazosin (MINIPRESS) 2 MG capsule Take 2 capsules (4 mg total) by mouth at bedtime. For nightmares 12/30/18   Lindell Spar I, NP  traZODone (DESYREL) 100 MG tablet Take 4 tablets (400 mg total) by mouth at bedtime as needed for sleep. 12/30/18   Lindell Spar I, NP  valACYclovir (VALTREX) 500 MG tablet Take 1 tablet (500 mg total) by mouth daily. 02/09/19 03/11/19  Trude Mcburney, FNP    Allergies    Lactose intolerance (gi) and Tape  Review of Systems   Review of Systems  Constitutional: Negative for chills and fever.  HENT: Negative for ear pain and sore throat.   Eyes: Negative for pain and visual disturbance.  Respiratory: Negative for cough and shortness of breath.   Cardiovascular: Positive for chest pain and palpitations.  Gastrointestinal: Negative for  abdominal pain and vomiting.  Genitourinary: Negative for dysuria and hematuria.  Musculoskeletal: Negative for arthralgias and back pain.  Skin: Negative for color change and rash.  Neurological: Negative for seizures and syncope.  All other systems reviewed and are negative.   Physical Exam Updated Vital Signs BP 132/71   Pulse 98   Temp 98.9 F (37.2 C) (Oral)   Resp 18   Ht 5\' 9"  (1.753 m)   Wt 110.8 kg   LMP 12/19/2018 Comment: pt reports irregular menses since getting nexplanon, lmp was over 28 days long.  SpO2 99%   BMI 36.06 kg/m   Physical Exam Vitals and nursing note reviewed.  Constitutional:      General: She is not in acute distress.    Appearance: She is well-developed.  HENT:     Head: Normocephalic and atraumatic.  Eyes:     Conjunctiva/sclera: Conjunctivae normal.  Cardiovascular:     Rate and Rhythm: Regular rhythm. Tachycardia present.     Heart sounds: No murmur.  Pulmonary:     Effort: Pulmonary effort is normal. No respiratory distress.     Breath sounds: Normal breath sounds.  Chest:     Comments: Mild tenderness over anterior chest wall, no crepitus, no deformity Abdominal:     Palpations: Abdomen is soft.     Tenderness: There is no abdominal tenderness.  Musculoskeletal:     Cervical back: Neck supple.  Skin:    General: Skin is warm and dry.     Capillary Refill: Capillary refill takes less than 2 seconds.  Neurological:     Mental Status: She is alert.     ED Results / Procedures / Treatments   Labs (all labs ordered are listed, but only abnormal results are displayed) Labs Reviewed  BASIC METABOLIC PANEL - Abnormal; Notable for the following components:      Result Value   Glucose, Bld 112 (*)    All other components within normal limits  CBC  D-DIMER, QUANTITATIVE (NOT AT [facility])  I-STAT BETA HCG BLOOD, ED ([facility], [facility], [facility] ONLY)  POC SARS CORONAVIRUS 2 AG -  ED  TROPONIN I (HIGH SENSITIVITY)  TROPONIN I (HIGH SENSITIVITY)     EKG EKG Interpretation  Date/Time:  Monday February 19 2019 15:56:34 EST Ventricular Rate:  114 PR Interval:  132 QRS Duration: 74 QT Interval:  290 QTC Calculation: 399 R Axis:   47 Text Interpretation: Sinus tachycardia Nonspecific T wave abnormality Abnormal ECG Confirmed by Madalyn Rob [phone]) on 02/19/2019 5:14:48 PM   Radiology DG Chest 2 View  Result Date: 02/19/2019 CLINICAL DATA:  Chest pain for 2 days. EXAM: CHEST - 2 VIEW COMPARISON:  10/31/2016 FINDINGS: Midline trachea.  Normal heart size and mediastinal contours. Sharp costophrenic angles.  No pneumothorax.  Clear lungs. IMPRESSION: Normal chest. Electronically Signed   By: Jeronimo Greaves M.D.   On: 02/19/2019 16:16    Procedures Procedures (including critical care time)  Medications Ordered in ED Medications - No data to display  ED Course  I have reviewed the triage vital signs and the nursing notes.  Pertinent labs & imaging results that were available during my care of the patient were reviewed by me and considered in my medical decision making (see chart for details).  Clinical Course as of Feb 19 114  Mon Feb 19, 2019  1847 Reviewed labs, will check dimer   [RD]  1910 Dimer negative, will update patient and discharge   [RD]    Clinical Course User Index [RD] Milagros Loll, MD   MDM Rules/Calculators/A&P                      19 year old presents to ER with chest burning sensation, on exam she is well-appearing, normal vital signs.  Noted initial mild tachycardia.  EKG without ischemic changes, troponin undetectable.  Doubt ACS.  Dimer within normal limits.  CXR negative.  Suspect reflux versus MSK.  Given above work-up, believe she is appropriate discharge and outpatient management this time. Recommend PCP f/u.    After the discussed management above, the patient was determined to be safe for discharge.  The patient was in agreement with this plan and all questions regarding their care  were answered.  ED return precautions were discussed and the patient will return to the ED with any significant worsening of condition.   Final Clinical Impression(s) / ED Diagnoses Final diagnoses:  Chest pain, unspecified type    Rx / DC Orders ED Discharge Orders    None       Milagros Loll, MD 02/20/19 [phone]

## 2019-02-19 NOTE — Discharge Instructions (Signed)
Recommend follow-up with your primary doctor regarding symptoms you are experiencing today.  Return to ER if you develop recurrent chest pain, difficulty breathing or other new concern symptom.

## 2019-02-19 NOTE — ED Notes (Signed)
An After Visit Summary was printed and given to the patient. Discharge instructions given and no further questions at this time. Pt denies chest pain at this time. Pt denies SOB.

## 2019-02-20 ENCOUNTER — Encounter: Payer: Self-pay | Admitting: Family

## 2019-02-22 ENCOUNTER — Other Ambulatory Visit: Payer: Self-pay | Admitting: Pediatrics

## 2019-02-22 MED ORDER — FLUCONAZOLE 150 MG PO TABS: ORAL_TABLET | ORAL | 0 refills | Status: DC

## 2019-03-09 ENCOUNTER — Encounter (HOSPITAL_COMMUNITY): Payer: Self-pay | Admitting: Obstetrics and Gynecology

## 2019-03-09 ENCOUNTER — Other Ambulatory Visit: Payer: Self-pay

## 2019-03-09 ENCOUNTER — Emergency Department (HOSPITAL_COMMUNITY)
Admission: EM | Admit: 2019-03-09 | Discharge: 2019-03-09 | Disposition: A | Discharge status: Home / Self Care | Payer: Medicaid Other | Attending: Emergency Medicine | Admitting: Emergency Medicine

## 2019-03-09 DIAGNOSIS — Z79899 Other long term (current) drug therapy: Secondary | ICD-10-CM | POA: Insufficient documentation

## 2019-03-09 DIAGNOSIS — Z20822 Contact with and (suspected) exposure to covid-19: Secondary | ICD-10-CM | POA: Diagnosis not present

## 2019-03-09 DIAGNOSIS — J029 Acute pharyngitis, unspecified: Secondary | ICD-10-CM | POA: Insufficient documentation

## 2019-03-09 DIAGNOSIS — F17210 Nicotine dependence, cigarettes, uncomplicated: Secondary | ICD-10-CM | POA: Diagnosis not present

## 2019-03-09 LAB — GROUP A STREP BY PCR: Group A Strep by PCR: NOT DETECTED

## 2019-03-09 LAB — CBG MONITORING, ED: Glucose-Capillary: 88 mg/dL (ref 70–99)

## 2019-03-09 NOTE — ED Provider Notes (Signed)
[place] DEPT Provider Note   CSN: [id number] Arrival date & time: 03/09/19  1032     History Chief Complaint  Patient presents with  . Sore Throat    [name] is a 19 y.o. female.  HPI 19 year old female presents with sore/dry throat.  Has been ongoing for about 5 days.  She rates the pain as a 1 out of 10.  No neck pain or stiffness.  No cough or shortness of breath.  She states she has a little bit of a headache and has had a low-grade fever of 99.8.  States her throat feels more like it is dry.  She thinks he probably does not drink as much fluids as she should.  She has not noticed any polyuria.   Past Medical History:  Diagnosis Date  . Anxiety   . Headache(784.0)   . Hx of suicide attempt   . Major depressive disorder   . PTSD (post-traumatic stress disorder)     Patient Active Problem List   Diagnosis Date Noted  . MDD (major depressive disorder), recurrent episode, severe ([place]) 12/27/2018  . Severe episode of recurrent major depressive disorder, without psychotic features ([place]) 10/10/2017  . Substance use disorder 06/08/2017  . Elevated blood pressure reading 04/13/2017  . 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 Hx of suicide attempt 03/19/2016  . PTSD (post-traumatic stress disorder) 03/19/2016  . Generalized anxiety disorder   . Suicidal ideation     History reviewed. No pertinent surgical history.   OB History    Gravida  1   Para  1   Term  1   Preterm  0   AB  0   Living  1     SAB  0   TAB  0   Ectopic  0   Multiple  0   Live Births  1           Family History  Problem Relation Age of Onset  . Diabetes Maternal Aunt   . Diabetes Maternal Grandmother   . Cancer Maternal Grandmother   . Breast cancer Other     Social History   Tobacco Use  . Smoking status: Current Every Day Smoker    Packs/day: 0.50    Types: Cigarettes, Cigars  . Smokeless tobacco: Never Used  . Tobacco comment: black and  mild  Substance Use Topics  . Alcohol use: Yes    Comment: socially  . Drug use: Yes    Types: Marijuana, MDMA (Ecstacy)    Home Medications Prior to Admission medications   Medication Sig Start Date End Date Taking? Authorizing Provider  albuterol (PROVENTIL HFA;VENTOLIN HFA) 108 (90 Base) MCG/ACT inhaler Inhale 1 puff into the lungs every 6 (six) hours as needed for wheezing or shortness of breath. 03/13/18  Yes Lindell Spar I, NP  ARIPiprazole (ABILIFY) 5 MG tablet Take 1 tablet (5 mg total) by mouth daily. For mood control 12/31/18  Yes Nwoko, Herbert Pun I, NP  busPIRone (BUSPAR) 10 MG tablet Take 10 mg by mouth 2 (two) times daily as needed for anxiety. 01/22/19  Yes [provider]  ibuprofen (ADVIL) 600 MG tablet Take 1,200 mg by mouth every 6 (six) hours as needed for fever, headache or moderate pain.   Yes [provider]  lamoTRIgine (LAMICTAL) 100 MG tablet Take 100 mg by mouth every morning. 02/15/19  Yes [provider]  prazosin (MINIPRESS) 5 MG capsule Take 5 mg by mouth at bedtime. 02/15/19  Yes  [provider]  traZODone (DESYREL) 100 MG tablet Take 4 tablets (400 mg total) by mouth at bedtime as needed for sleep. 12/30/18  Yes Armandina Stammer I, NP  valACYclovir (VALTREX) 500 MG tablet Take 1 tablet (500 mg total) by mouth daily. Patient taking differently: Take 500 mg by mouth daily as needed (outbreak).  02/09/19 03/11/19 Yes [name], FNP  fluconazole (DIFLUCAN) 150 MG tablet Take 1 tablet today and 1 tablet 3 days from now Patient not taking: Reported on 03/09/2019 02/22/19   Alfonso Ramus T, FNP  hydrOXYzine (ATARAX/VISTARIL) 25 MG tablet Take 1 tablet (25 mg total) by mouth 3 (three) times daily as needed for anxiety. Patient not taking: Reported on 03/09/2019 12/30/18   Armandina Stammer I, NP  lamoTRIgine (LAMICTAL) 25 MG tablet Take 2 tablets (50 mg total) by mouth 2 (two) times daily. For mood stabilization Patient not taking: Reported on  03/09/2019 12/30/18   Armandina Stammer I, NP  nicotine (NICODERM CQ - DOSED IN MG/24 HOURS) 21 mg/24hr patch Place 1 patch (21 mg total) onto the skin daily. (May buy from over the counter): For smoking cessation Patient not taking: Reported on 03/09/2019 12/30/18   Armandina Stammer I, NP  prazosin (MINIPRESS) 2 MG capsule Take 2 capsules (4 mg total) by mouth at bedtime. For nightmares Patient not taking: Reported on 03/09/2019 12/30/18   Armandina Stammer I, NP    Allergies    Lactose intolerance (gi) and Tape  Review of Systems   Review of Systems  Constitutional: Negative for fever.  HENT: Positive for sore throat.   Respiratory: Negative for cough and shortness of breath.   Gastrointestinal: Negative for vomiting.  Musculoskeletal: Negative for neck pain.  Neurological: Positive for headaches.  All other systems reviewed and are negative.   Physical Exam Updated Vital Signs BP 137/89 (BP Location: Left Arm)   Pulse 78   Temp 98 F (36.7 C) (Oral)   Resp 17   LMP 02/28/2019 (Approximate)   SpO2 100%   Physical Exam Vitals and nursing note reviewed.  Constitutional:      General: She is not in acute distress.    Appearance: She is well-developed. She is obese. She is not ill-appearing or diaphoretic.  HENT:     Head: Normocephalic and atraumatic.     Right Ear: External ear normal.     Left Ear: External ear normal.     Nose: Nose normal.     Mouth/Throat:     Pharynx: Uvula midline. No oropharyngeal exudate or posterior oropharyngeal erythema.     Tonsils: No tonsillar exudate or tonsillar abscesses.  Eyes:     General:        Right eye: No discharge.        Left eye: No discharge.  Cardiovascular:     Rate and Rhythm: Normal rate and regular rhythm.     Heart sounds: Normal heart sounds.  Pulmonary:     Effort: Pulmonary effort is normal.     Breath sounds: Normal breath sounds.  Abdominal:     Palpations: Abdomen is soft.     Tenderness: There is no abdominal tenderness.   Musculoskeletal:     Cervical back: Normal range of motion and neck supple.  Lymphadenopathy:     Cervical: No cervical adenopathy.  Skin:    General: Skin is warm and dry.  Neurological:     Mental Status: She is alert.  Psychiatric:        Mood and  Affect: Mood is not anxious.     ED Results / Procedures / Treatments   Labs (all labs ordered are listed, but only abnormal results are displayed) Labs Reviewed  GROUP A STREP BY PCR  NOVEL CORONAVIRUS, NAA (HOSP ORDER, SEND-OUT TO REF LAB; TAT 18-24 HRS)  CBG MONITORING, ED    EKG None  Radiology No results found.  Procedures Procedures (including critical care time)  Medications Ordered in ED Medications - No data to display  ED Course  I have reviewed the triage vital signs and the nursing notes.  Pertinent labs & imaging results that were available during my care of the patient were reviewed by me and considered in my medical decision making (see chart for details).    MDM Rules/Calculators/A&P                      Patient's exam is pretty unremarkable.  Highly doubt deep space infection.  With negative strep this is unlikely to be bacterial.  Consider Covid given the current pandemic but the presentation is still not consistent with that.  We will send outpatient testing.  Recommend supportive care with fluids and NSAIDs.  [name] was evaluated in Emergency Department on 03/09/2019 for the symptoms described in the history of present illness. She was evaluated in the context of the global COVID-19 pandemic, which necessitated consideration that the patient might be at risk for infection with the SARS-CoV-2 virus that causes COVID-19. Institutional protocols and algorithms that pertain to the evaluation of patients at risk for COVID-19 are in a state of rapid change based on information released by regulatory bodies including the CDC and federal and state organizations. These policies and algorithms were followed  during the patient's care in the ED.  Final Clinical Impression(s) / ED Diagnoses Final diagnoses:  Viral pharyngitis    Rx / DC Orders ED Discharge Orders    None       Pricilla Loveless, MD 03/09/19 1325

## 2019-03-09 NOTE — ED Triage Notes (Signed)
Pt reports sore throat and low grade fever x5 days. Patient reports she called her PCP this morning and was told to come to ER

## 2019-03-10 LAB — NOVEL CORONAVIRUS, NAA (HOSP ORDER, SEND-OUT TO REF LAB; TAT 18-24 HRS): SARS-CoV-2, NAA: NOT DETECTED

## 2019-04-02 ENCOUNTER — Other Ambulatory Visit: Payer: Self-pay | Admitting: Pediatrics

## 2019-04-05 ENCOUNTER — Other Ambulatory Visit: Payer: Self-pay

## 2019-04-05 ENCOUNTER — Other Ambulatory Visit: Payer: Medicaid Other

## 2019-04-05 DIAGNOSIS — A159 Respiratory tuberculosis unspecified: Secondary | ICD-10-CM

## 2019-04-07 LAB — QUANTIFERON-TB GOLD PLUS
Mitogen-NIL: 8.12 IU/mL
NIL: 0.04 IU/mL
QuantiFERON-TB Gold Plus: NEGATIVE
TB1-NIL: <0 IU/mL
TB2-NIL: 0 IU/mL

## 2019-04-07 NOTE — Progress Notes (Signed)
Labs collected by Theressa, CMA.  

## 2019-04-16 ENCOUNTER — Other Ambulatory Visit: Payer: Medicaid Other

## 2019-04-17 ENCOUNTER — Other Ambulatory Visit: Payer: Self-pay

## 2019-04-17 ENCOUNTER — Other Ambulatory Visit (HOSPITAL_COMMUNITY)
Admission: RE | Admit: 2019-04-17 | Discharge: 2019-04-17 | Disposition: A | Discharge status: Home / Self Care | Payer: Medicaid Other | Source: Ambulatory Visit | Attending: Pediatrics | Admitting: Pediatrics

## 2019-04-17 ENCOUNTER — Ambulatory Visit: Payer: Medicaid Other | Admitting: *Deleted

## 2019-04-17 DIAGNOSIS — Z113 Encounter for screening for infections with a predominantly sexual mode of transmission: Secondary | ICD-10-CM | POA: Insufficient documentation

## 2019-04-18 LAB — WET PREP BY MOLECULAR PROBE
Candida species: DETECTED — AB
Gardnerella vaginalis: NOT DETECTED
MICRO NUMBER:: 10282265
SPECIMEN QUALITY:: ADEQUATE
Trichomonas vaginosis: NOT DETECTED

## 2019-04-18 LAB — URINE CYTOLOGY ANCILLARY ONLY
Chlamydia: NEGATIVE
Comment: NEGATIVE
Comment: NORMAL
Neisseria Gonorrhea: NEGATIVE

## 2019-04-19 ENCOUNTER — Other Ambulatory Visit: Payer: Self-pay | Admitting: Pediatrics

## 2019-04-19 MED ORDER — FLUCONAZOLE 150 MG PO TABS: ORAL_TABLET | ORAL | 0 refills | Status: DC

## 2019-04-24 NOTE — Progress Notes (Signed)
Patient came in for labs GC urine and Wet prep. Labs ordered by Alfonso Ramus. Successful collection.

## 2019-05-07 ENCOUNTER — Other Ambulatory Visit (HOSPITAL_COMMUNITY)
Admission: RE | Admit: 2019-05-07 | Discharge: 2019-05-07 | Disposition: A | Discharge status: Home / Self Care | Payer: Medicaid Other | Source: Ambulatory Visit | Attending: Family | Admitting: Family

## 2019-05-07 ENCOUNTER — Encounter: Payer: Self-pay | Admitting: Pediatrics

## 2019-05-07 ENCOUNTER — Other Ambulatory Visit: Payer: Self-pay

## 2019-05-07 ENCOUNTER — Ambulatory Visit (INDEPENDENT_AMBULATORY_CARE_PROVIDER_SITE_OTHER): Payer: Medicaid Other | Admitting: Pediatrics

## 2019-05-07 VITALS — BP 132/83 | HR 101 | Ht 68.11 in | Wt 255.0 lb

## 2019-05-07 DIAGNOSIS — Z3046 Encounter for surveillance of implantable subdermal contraceptive: Secondary | ICD-10-CM

## 2019-05-07 DIAGNOSIS — Z113 Encounter for screening for infections with a predominantly sexual mode of transmission: Secondary | ICD-10-CM | POA: Insufficient documentation

## 2019-05-07 DIAGNOSIS — Z3202 Encounter for pregnancy test, result negative: Secondary | ICD-10-CM

## 2019-05-07 DIAGNOSIS — Z3009 Encounter for other general counseling and advice on contraception: Secondary | ICD-10-CM

## 2019-05-07 LAB — POCT URINE PREGNANCY: Preg Test, Ur: NEGATIVE

## 2019-05-07 MED ORDER — LEVONORGESTREL 1.5 MG PO TABS: 1.5000 mg | ORAL_TABLET | Freq: Once | ORAL | 2 refills | Status: AC

## 2019-05-07 MED ORDER — NORETHINDRONE ACET-ETHINYL EST 1.5-30 MG-MCG PO TABS: 1.0000 | ORAL_TABLET | Freq: Every day | ORAL | 11 refills | Status: DC

## 2019-05-07 NOTE — Progress Notes (Signed)
History was provided by the patient.  [name] is a 19 y.o. female who is here for nexplanon removal and contraceptive consideration.   PCP confirmed? Yes.    Trude Mcburney, FNP  HPI:  [name] is a [place] female here for nexplanon removal. Her primary rationale for it's removal is "I bleed all the time." Nexplanon placed in October 2020.  In November, had two periods. In December, "bled the whole month". Mix of tampons and pads. Only two days without bleeding. In January, did not have any periods. In February, she had three periods of 3-4 days. In March, on and off bleeding all month.  Not sure if it's giving her mood swings, but otherwise feels like her mood is ok. It's better with a regular work schedule. Denies SI. Did not start supplemental OCPs with nexplanon.  When asked if wants to become pregnant in the next year, "kind of."  Last unprotected sex 4/9. Does not want to become pregnant until after May 2021, as she has a vacation scheduled to [place]. Is HSV+, no active lesions and starts ppx with any concerning symptoms (itching, dysuria). Asking about role for vaginal birth v C/S in context of herpes infection. Rarely uses barrier protection ("free ones are too small"). Had BV last month and had extra flagyl; developed yeast infection and treated w diflucan. Is smoking cigarettes and vaping. Going through 1 pack every 2-3 days. No history of blood clots. LMP: 05/02/19  Review of Systems  Constitutional: Negative for fever.  HENT: Negative.   Eyes: Negative.   Respiratory: Negative.   Cardiovascular: Negative.   Gastrointestinal: Negative.   Genitourinary: Negative.   Musculoskeletal: Negative.   Skin: Negative for rash.  Neurological: Negative for dizziness.  Psychiatric/Behavioral: Negative for suicidal ideas.    Patient Active Problem List   Diagnosis Date Noted  . MDD (major depressive disorder), recurrent episode, severe ([place]) 12/27/2018  . Severe episode of recurrent  major depressive disorder, without psychotic features ([place]) 10/10/2017  . Substance use disorder 06/08/2017  . Elevated blood pressure reading 04/13/2017  . 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 Hx of suicide attempt 03/19/2016  . PTSD (post-traumatic stress disorder) 03/19/2016  . Generalized anxiety disorder   . Suicidal ideation     Current Outpatient Medications on File Prior to Visit  Medication Sig Dispense Refill  . albuterol (PROVENTIL HFA;VENTOLIN HFA) 108 (90 Base) MCG/ACT inhaler Inhale 1 puff into the lungs every 6 (six) hours as needed for wheezing or shortness of breath.    . ARIPiprazole (ABILIFY) 5 MG tablet Take 1 tablet (5 mg total) by mouth daily. For mood control 30 tablet 0  . busPIRone (BUSPAR) 10 MG tablet Take 10 mg by mouth 2 (two) times daily as needed for anxiety.    . lamoTRIgine (LAMICTAL) 100 MG tablet Take 100 mg by mouth every morning.    . prazosin (MINIPRESS) 5 MG capsule Take 5 mg by mouth at bedtime.    . traZODone (DESYREL) 100 MG tablet Take 4 tablets (400 mg total) by mouth at bedtime as needed for sleep. 30 tablet 0  . ibuprofen (ADVIL) 600 MG tablet Take 1,200 mg by mouth every 6 (six) hours as needed for fever, headache or moderate pain.     No current facility-administered medications on file prior to visit.    Allergies  Allergen Reactions  . Lactose Intolerance (Gi) Nausea And Vomiting and Other (See Comments)  . Tape Other (See Comments)    Slight skin irritation  Physical Exam:    Vitals:   05/07/19 1019  BP: 132/83  Pulse: (!) 101  Weight: 255 lb (115.7 kg)  Height: 5' 8.11" (1.73 m)    Growth percentile SmartLinks can only be used for patients less than 98 years old. No LMP recorded.  Physical Exam Vitals reviewed.  Constitutional:      General: She is not in acute distress.    Appearance: Normal appearance. She is obese. She is not ill-appearing or diaphoretic.  HENT:     Head: Normocephalic and atraumatic.     Nose: Nose  normal.     Mouth/Throat:     Mouth: Mucous membranes are dry.  Cardiovascular:     Rate and Rhythm: Normal rate and regular rhythm.     Pulses: Normal pulses.     Heart sounds: No murmur.  Pulmonary:     Effort: Pulmonary effort is normal.     Breath sounds: Normal breath sounds.  Abdominal:     General: Abdomen is flat.  Musculoskeletal:        General: Normal range of motion.     Cervical back: Normal range of motion.     Comments: Palpable nexplanon without erythema or tenderness in left medial upper arm  Skin:    General: Skin is warm and dry.     Capillary Refill: Capillary refill takes less than 2 seconds.  Neurological:     General: No focal deficit present.     Mental Status: She is alert and oriented to person, place, and time. Mental status is at baseline.  Psychiatric:        Mood and Affect: Mood normal.        Behavior: Behavior normal.        Thought Content: Thought content normal.        Judgment: Judgment normal.     Assessment/Plan: [name] is a 19 yo G1P1001 here for nexplanon removal. She is interested, but non-committal, to pregnancy within the next year.   Counseling re contraceptive management - Will remove nexplanon today.  - Patient would like to start OCPs. Upreg negative here. Does smoke, but no other history of blood clots. Start Junel 1.5/30 - counseled on importance of barrier protection, provided condoms  Care Transition Discussed transition of care to adult provider. Will place referral to family medicine for consideration of acute and pre-natal care.  Follow up in 4 weeks for post-nexplanon removal.

## 2019-05-07 NOTE — Patient Instructions (Addendum)
   Your Nexplanon was removed today and is no longer preventing pregnancy.  If you have sex, remember to use condoms to prevent pregnancy and to prevent sexually transmitted infections.  Leave the outside bandage on for 24 hours.  Leave the smaller bandages on for 3-5 days or until they fall off on their own.  Keep the area clean and dry for 3-5 days.  There is usually bruising or swelling at and around the removal site for a few days to a week after the removal.  If you see redness or pus draining from the removal site, call us immediately.  We would like you to return to the clinic for a follow-up visit in 1 month.  You can call [facility] 24 hours a day with any questions or concerns.  There is always a nurse or doctor available to take your call.  Call 9-1-1 if you have a life-threatening emergency.  For anything else, please call us at [phone] before heading to the ER.  We also sent off urine pregnancy and STI screening.

## 2019-05-08 ENCOUNTER — Telehealth: Payer: Self-pay

## 2019-05-08 NOTE — Progress Notes (Signed)
Risks & benefits of Nexplanon removal discussed. Consent form signed.  The patient denies any allergies to anesthetics or antiseptics.  Procedure: Pt was placed in supine position. left arm was flexed at the elbow and externally rotated so that her wrist was parallel to her ear, The device was palpated and marked. The site was cleaned with Betadine. The area surrounding the device was covered with a sterile drape. 1% lidocaine was injected just under the device. A scalpel was used to create a small incision. The device was pushed towards the incision. Fibrous tissue surrounding the device was gradually removed from the device. The device was removed and measured to ensure all 4 cm of device was removed. Steri-strips were used to close the incision. Pressure dressing was applied to the patient.  The patient was instructed to removed the pressure dressing in 24 hrs.  The patient was advised to move slowly from a supine to an upright position  The patient denied any concerns or complaints  The patient was instructed to schedule a follow-up appt in 1 month. The patient will be called in 1 week to address any concerns.  

## 2019-05-08 NOTE — Telephone Encounter (Signed)
She should take some ibuprofen 600-800 mg every 4-6 hours and call tomorrow.

## 2019-05-08 NOTE — Progress Notes (Signed)
I have reviewed the resident's note and plan of care and helped develop the plan as necessary.  I completed procedure as documented. We discussed risks and benefits of removal and pregnancy. She agreed to plan B rx as well to have at her house if she needs it. Continues to work on parenting her current child as well as managing her mental health. We discussed transition to adult care to [facility] which I think would be a good fit for her. She was agreeable. Referral placed. Discussed using pcp for non-urgent matters vs. ED.   Alfonso Ramus, FNP

## 2019-05-08 NOTE — Telephone Encounter (Signed)
[name] left message on nurse line saying she had birth control removed yesterday; today feels very sore and hard to move. Asks if she should be seen in urgent care or schedule appointment.

## 2019-05-08 NOTE — Telephone Encounter (Signed)
I spoke to [name] and relayed message from C. Hacker. [name] took ibuprofen 600 mg x1 last night but it did not help; asks if it is normal for her whole body to ache. I recommended scheduled ibuprofen today, call tomorrow.

## 2019-05-10 ENCOUNTER — Other Ambulatory Visit: Payer: Self-pay | Admitting: Pediatrics

## 2019-05-10 DIAGNOSIS — Z7251 High risk heterosexual behavior: Secondary | ICD-10-CM

## 2019-05-10 MED ORDER — ALBUTEROL SULFATE HFA 108 (90 BASE) MCG/ACT IN AERS: 1.0000 | INHALATION_SPRAY | Freq: Four times a day (QID) | RESPIRATORY_TRACT | Status: DC | PRN

## 2019-05-10 MED ORDER — LEVONORGESTREL 1.5 MG PO TABS: 1.5000 mg | ORAL_TABLET | Freq: Once | ORAL | 6 refills | Status: AC

## 2019-05-11 LAB — URINE CYTOLOGY ANCILLARY ONLY
Bacterial Vaginitis-Urine: NEGATIVE
Candida Urine: NEGATIVE
Chlamydia: NEGATIVE
Comment: NEGATIVE
Comment: NEGATIVE
Comment: NORMAL
Neisseria Gonorrhea: NEGATIVE
Trichomonas: NEGATIVE

## 2019-05-11 MED ORDER — ALBUTEROL SULFATE HFA 108 (90 BASE) MCG/ACT IN AERS: 1.0000 | INHALATION_SPRAY | Freq: Four times a day (QID) | RESPIRATORY_TRACT | Status: DC | PRN

## 2019-05-11 MED ORDER — LEVONORGESTREL 1.5 MG PO TABS: 1.5000 mg | ORAL_TABLET | Freq: Once | ORAL | 3 refills | Status: AC

## 2019-05-15 ENCOUNTER — Telehealth: Payer: Medicaid Other | Admitting: Pediatrics

## 2019-05-18 ENCOUNTER — Telehealth (INDEPENDENT_AMBULATORY_CARE_PROVIDER_SITE_OTHER): Payer: Medicaid Other | Admitting: Family

## 2019-05-18 ENCOUNTER — Encounter: Payer: Self-pay | Admitting: Family

## 2019-05-18 DIAGNOSIS — B373 Candidiasis of vulva and vagina: Secondary | ICD-10-CM | POA: Diagnosis not present

## 2019-05-18 DIAGNOSIS — N898 Other specified noninflammatory disorders of vagina: Secondary | ICD-10-CM

## 2019-05-18 DIAGNOSIS — Z7251 High risk heterosexual behavior: Secondary | ICD-10-CM | POA: Diagnosis not present

## 2019-05-18 DIAGNOSIS — Z113 Encounter for screening for infections with a predominantly sexual mode of transmission: Secondary | ICD-10-CM | POA: Diagnosis not present

## 2019-05-18 DIAGNOSIS — B3731 Acute candidiasis of vulva and vagina: Secondary | ICD-10-CM

## 2019-05-18 MED ORDER — FLUCONAZOLE 150 MG PO TABS: 150.0000 mg | ORAL_TABLET | Freq: Every day | ORAL | 0 refills | Status: DC

## 2019-05-18 MED ORDER — FLINSTONES GUMMIES OMEGA-3 DHA PO CHEW: 1.0000 | CHEWABLE_TABLET | Freq: Every day | ORAL | 1 refills | Status: DC

## 2019-05-18 NOTE — Progress Notes (Signed)
This note is not being shared with the patient for the following reason: To respect privacy (The patient or proxy has requested that the information not be shared).  THIS RECORD MAY CONTAIN CONFIDENTIAL INFORMATION THAT SHOULD NOT BE RELEASED WITHOUT REVIEW OF THE SERVICE PROVIDER.  Virtual Follow-Up Visit via Video Note  I connected with [name]  on 05/18/19 at 11:00 AM EDT by a video enabled telemedicine application and verified that I am speaking with the correct person using two identifiers.   Patient/parent location: home    I discussed the limitations of evaluation and management by telemedicine and the availability of in person appointments.  I discussed that the purpose of this telehealth visit is to provide medical care while limiting exposure to the novel coronavirus.  The patient expressed understanding and agreed to proceed.   [name] is a 19 y.o. female referred by Trude Mcburney, FNP here today for follow-up of unprotected intercourse.    History was provided by the patient.  Plan from Last Visit:   Had nexplanon removed last Monday  Chief Complaint: Unprotected intercourse Vaginal discharge   History of Present Illness:  -yeast infection symptoms: white discharge and itching; has frequent yeast infections.  -had nexplanon removed last Monday; had sex on 14th. -had Plan B but [facility] did not notify her -definitely has baby fever; but she was planning to wait a few months  -not currently taking prenatals   Review of Systems  Constitutional: Negative for chills, fever and malaise/fatigue.  HENT: Negative for sore throat.   Eyes: Negative for blurred vision.  Respiratory: Negative for shortness of breath.   Cardiovascular: Negative for chest pain and palpitations.  Gastrointestinal: Negative for abdominal pain, nausea and vomiting.  Genitourinary: Negative for dysuria and frequency.  Musculoskeletal: Negative for joint pain and myalgias.  Neurological:  Negative for dizziness and headaches.  Psychiatric/Behavioral: The patient is not nervous/anxious.      Allergies  Allergen Reactions  . Lactose Intolerance (Gi) Nausea And Vomiting and Other (See Comments)  . Tape Other (See Comments)    Slight skin irritation   Outpatient Medications Prior to Visit  Medication Sig Dispense Refill  . albuterol (VENTOLIN HFA) 108 (90 Base) MCG/ACT inhaler Inhale 1 puff into the lungs every 6 (six) hours as needed for wheezing or shortness of breath.    . ARIPiprazole (ABILIFY) 5 MG tablet Take 1 tablet (5 mg total) by mouth daily. For mood control 30 tablet 0  . busPIRone (BUSPAR) 10 MG tablet Take 10 mg by mouth 2 (two) times daily as needed for anxiety.    Marland Kitchen ibuprofen (ADVIL) 600 MG tablet Take 1,200 mg by mouth every 6 (six) hours as needed for fever, headache or moderate pain.    Marland Kitchen lamoTRIgine (LAMICTAL) 100 MG tablet Take 100 mg by mouth every morning.    . Norethindrone Acetate-Ethinyl Estradiol (JUNEL 1.5/30) 1.5-30 MG-MCG tablet Take 1 tablet by mouth daily. 1 Package 11  . prazosin (MINIPRESS) 5 MG capsule Take 5 mg by mouth at bedtime.    . traZODone (DESYREL) 100 MG tablet Take 4 tablets (400 mg total) by mouth at bedtime as needed for sleep. 30 tablet 0   No facility-administered medications prior to visit.     Patient Active Problem List   Diagnosis Date Noted  . MDD (major depressive disorder), recurrent episode, severe ([place]) 12/27/2018  . Severe episode of recurrent major depressive disorder, without psychotic features ([place]) 10/10/2017  . Substance use disorder 06/08/2017  .  Elevated blood pressure reading 04/13/2017  . 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 Hx of suicide attempt 03/19/2016  . PTSD (post-traumatic stress disorder) 03/19/2016  . Generalized anxiety disorder   . Suicidal ideation    The following portions of the patient's history were reviewed and updated as appropriate: allergies, current medications, past family history,  past medical history, past social history, past surgical history and problem list.  Visual Observations/Objective:   General Appearance: Well nourished well developed, in no apparent distress.  Eyes: conjunctiva no swelling or erythema ENT/Mouth: No hoarseness, No cough for duration of visit.  Neck: Supple  Respiratory: Respiratory effort normal, normal rate, no retractions or distress.   Cardio: Appears well-perfused, noncyanotic Musculoskeletal: no obvious deformity Skin: visible skin without rashes, ecchymosis, erythema Neuro: Awake and oriented X 3,  Psych:  normal affect, Insight and Judgment appropriate.    Assessment/Plan:  19 yo A/I female presents for concerns of unprotected intercourse and was unable to obtain EC during window. Has symptoms consistent with yeast; will treat empirically and advised coming into clinic for assesssment of other STIs and beta-HCG. Discussed prenatal vitamins and Rx sent for Flintstones gummies.   1. Vaginal discharge  - Urine cytology ancillary only; Future - WET PREP BY MOLECULAR PROBE; Future  2. Unprotected sex  - Urine cytology ancillary only; Future - WET PREP BY MOLECULAR PROBE; Future - RPR; Future - HIV Antibody (routine testing w rflx); Future - Beta HCG, Quant; Future  3. Routine screening for STI (sexually transmitted infection)  - Urine cytology ancillary only; Future - RPR; Future - HIV Antibody (routine testing w rflx); Future   I discussed the assessment and treatment plan with the patient and/or parent/guardian.  They were provided an opportunity to ask questions and all were answered.  They agreed with the plan and demonstrated an understanding of the instructions. They were advised to call back or seek an in-person evaluation in the emergency room if the symptoms worsen or if the condition fails to improve as anticipated.   Follow-up:   RN visit next week for screenings as above.   Medical decision-making:   I  spent 15 minutes on this telehealth visit inclusive of face-to-face video and care coordination time I was located remote during this encounter.   Georges Mouse, NP    CC: [name], FNP, [name], FNP

## 2019-05-22 ENCOUNTER — Other Ambulatory Visit: Payer: Self-pay

## 2019-05-22 ENCOUNTER — Other Ambulatory Visit (INDEPENDENT_AMBULATORY_CARE_PROVIDER_SITE_OTHER): Payer: Medicaid Other

## 2019-05-22 ENCOUNTER — Other Ambulatory Visit (HOSPITAL_COMMUNITY)
Admission: RE | Admit: 2019-05-22 | Discharge: 2019-05-22 | Disposition: A | Discharge status: Home / Self Care | Payer: Medicaid Other | Source: Ambulatory Visit | Attending: Family | Admitting: Family

## 2019-05-22 DIAGNOSIS — Z113 Encounter for screening for infections with a predominantly sexual mode of transmission: Secondary | ICD-10-CM

## 2019-05-22 DIAGNOSIS — B373 Candidiasis of vulva and vagina: Secondary | ICD-10-CM | POA: Diagnosis not present

## 2019-05-22 DIAGNOSIS — N898 Other specified noninflammatory disorders of vagina: Secondary | ICD-10-CM | POA: Insufficient documentation

## 2019-05-22 DIAGNOSIS — Z7251 High risk heterosexual behavior: Secondary | ICD-10-CM

## 2019-05-22 DIAGNOSIS — B3731 Acute candidiasis of vulva and vagina: Secondary | ICD-10-CM

## 2019-05-23 LAB — WET PREP BY MOLECULAR PROBE
Candida species: DETECTED — AB
MICRO NUMBER:: 10410647
SPECIMEN QUALITY:: ADEQUATE
Trichomonas vaginosis: NOT DETECTED

## 2019-05-23 LAB — RPR: RPR Ser Ql: NONREACTIVE

## 2019-05-23 LAB — HEMOGLOBIN A1C
Hgb A1c MFr Bld: 5.5 % of total Hgb (ref <5.7)
Mean Plasma Glucose: 111 (calc)
eAG (mmol/L): 6.2 (calc)

## 2019-05-23 LAB — URINE CYTOLOGY ANCILLARY ONLY
Chlamydia: NEGATIVE
Comment: NEGATIVE
Comment: NORMAL
Neisseria Gonorrhea: NEGATIVE

## 2019-05-23 LAB — HIV ANTIBODY (ROUTINE TESTING W REFLEX): HIV 1&2 Ab, 4th Generation: NONREACTIVE

## 2019-05-23 LAB — HCG, QUANTITATIVE, PREGNANCY: HCG, Total, QN: <3 m[IU]/mL

## 2019-05-28 ENCOUNTER — Ambulatory Visit: Payer: Medicaid Other | Admitting: Pediatrics

## 2019-06-01 NOTE — Progress Notes (Signed)
Patient came in for labs Gc urine, Wet prep, Hgb A1c, HGC beta, RPR and HIV antibody.Labs ordered by Alfonso Ramus. Successful collection.

## 2019-06-04 ENCOUNTER — Ambulatory Visit: Payer: Medicaid Other | Admitting: Pediatrics

## 2019-06-06 ENCOUNTER — Telehealth (INDEPENDENT_AMBULATORY_CARE_PROVIDER_SITE_OTHER): Payer: Medicaid Other | Admitting: Pediatrics

## 2019-06-06 DIAGNOSIS — Z7251 High risk heterosexual behavior: Secondary | ICD-10-CM

## 2019-06-06 DIAGNOSIS — R102 Pelvic and perineal pain: Secondary | ICD-10-CM

## 2019-06-06 MED ORDER — LEVONORGESTREL 1.5 MG PO TABS: 1.5000 mg | ORAL_TABLET | Freq: Once | ORAL | 3 refills | Status: AC

## 2019-06-06 NOTE — Progress Notes (Signed)
THIS RECORD MAY CONTAIN CONFIDENTIAL INFORMATION THAT SHOULD NOT BE RELEASED WITHOUT REVIEW OF THE SERVICE PROVIDER.  Virtual Follow-Up Visit via Video Note  I connected with [name] 's patient  on 06/06/19 at  3:30 PM EDT by a video enabled telemedicine application and verified that I am speaking with the correct person using two identifiers.   Patient/parent location: home in [place]    I discussed the limitations of evaluation and management by telemedicine and the availability of in person appointments.  I discussed that the purpose of this telehealth visit is to provide medical care while limiting exposure to the novel coronavirus.  The patient expressed understanding and agreed to proceed.   [name] is a 19 y.o. female referred by Trude Mcburney, FNP here today for follow-up of vaginal discharge.  Previsit planning completed:  yes   History was provided by the patient.  Plan from Last Visit:   tx empirically for yeast, was supposed to have labs   Chief Complaint: Vaginal d/c f/u and pelvic pain  History of Present Illness:  Thinks she might still have BV. She completed the treatment for yeast, but has "that smell" again. Her discharge is gone. Denies bleeding. Has not yet had a period since her nexpalnon was removed.   Keeps having a pain in her pelvic area. It is mostly on the right side. She sometimes has pain with sex, but it is mostly when she goes to stand up out of a squatting or sitting position.   Has had unprotected intercourse since last visit  Low grade temp at 99.7. Denies N/V/D. No back pain.   Review of Systems  Constitutional: Negative for malaise/fatigue.  Eyes: Negative for double vision.  Respiratory: Negative for shortness of breath.   Cardiovascular: Negative for chest pain and palpitations.  Gastrointestinal: Positive for abdominal pain. Negative for constipation, diarrhea, nausea and vomiting.  Genitourinary: Negative for dysuria.   Musculoskeletal: Negative for joint pain and myalgias.  Skin: Negative for rash.  Neurological: Negative for dizziness and headaches.  Endo/Heme/Allergies: Does not bruise/bleed easily.     Allergies  Allergen Reactions  . Lactose Intolerance (Gi) Nausea And Vomiting and Other (See Comments)  . Tape Other (See Comments)    Slight skin irritation   Outpatient Medications Prior to Visit  Medication Sig Dispense Refill  . albuterol (VENTOLIN HFA) 108 (90 Base) MCG/ACT inhaler Inhale 1 puff into the lungs every 6 (six) hours as needed for wheezing or shortness of breath.    . ARIPiprazole (ABILIFY) 5 MG tablet Take 1 tablet (5 mg total) by mouth daily. For mood control 30 tablet 0  . busPIRone (BUSPAR) 10 MG tablet Take 10 mg by mouth 2 (two) times daily as needed for anxiety.    . fluconazole (DIFLUCAN) 150 MG tablet Take 1 tablet (150 mg total) by mouth daily. Take 2nd dose in 3 days if still symptoms. 2 tablet 0  . ibuprofen (ADVIL) 600 MG tablet Take 1,200 mg by mouth every 6 (six) hours as needed for fever, headache or moderate pain.    Marland Kitchen lamoTRIgine (LAMICTAL) 100 MG tablet Take 100 mg by mouth every morning.    . Norethindrone Acetate-Ethinyl Estradiol (JUNEL 1.5/30) 1.5-30 MG-MCG tablet Take 1 tablet by mouth daily. 1 Package 11  . Pediatric Multiple Vit-C-FA (FLINSTONES GUMMIES OMEGA-3 DHA) CHEW Chew 1 tablet by mouth daily with breakfast. 90 tablet 1  . prazosin (MINIPRESS) 5 MG capsule Take 5 mg by mouth at bedtime.    Marland Kitchen  traZODone (DESYREL) 100 MG tablet Take 4 tablets (400 mg total) by mouth at bedtime as needed for sleep. 30 tablet 0   No facility-administered medications prior to visit.     Patient Active Problem List   Diagnosis Date Noted  . MDD (major depressive disorder), recurrent episode, severe (HCC) 12/27/2018  . Severe episode of recurrent major depressive disorder, without psychotic features (HCC) 10/10/2017  . Substance use disorder 06/08/2017  . Elevated  blood pressure reading 04/13/2017  . 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 Hx of suicide attempt 03/19/2016  . PTSD (post-traumatic stress disorder) 03/19/2016  . Generalized anxiety disorder   . Suicidal ideation     The following portions of the patient's history were reviewed and updated as appropriate: allergies, current medications, past family history, past medical history, past social history, past surgical history and problem list.  Visual Observations/Objective:   General Appearance: Well nourished well developed, in no apparent distress.  Eyes: conjunctiva no swelling or erythema ENT/Mouth: No hoarseness, No cough for duration of visit.  Neck: Supple  Respiratory: Respiratory effort normal, normal rate, no retractions or distress.   Cardio: Appears well-perfused, noncyanotic Musculoskeletal: no obvious deformity Skin: visible skin without rashes, ecchymosis, erythema Neuro: Awake and oriented X 3,  Psych:  normal affect, Insight and Judgment appropriate.    Assessment/Plan: 1. Pelvic pain Sounds likely MSK in nature, but given that there is some pain with sex, vaginal discharge and reported temp of 99.7 today, we should evaluate further in clinic tomorrow. ER precautions given.   2. Unprotected sexual intercourse Was not able to get previously at pharmacy. Will also ensure she is taking PNV.  - levonorgestrel (PLAN B ONE-STEP) 1.5 MG tablet; Take 1 tablet (1.5 mg total) by mouth once for 1 dose.  Dispense: 1 tablet; Refill: 3    I discussed the assessment and treatment plan with the patient and/or parent/guardian.  They were provided an opportunity to ask questions and all were answered.  They agreed with the plan and demonstrated an understanding of the instructions. They were advised to call back or seek an in-person evaluation in the emergency room if the symptoms worsen or if the condition fails to improve as anticipated.   Follow-up:  Tomorrow AM in clinic    I was  located off site during this encounter.   Alfonso Ramus, FNP    CC: [name], FNP, [name], FNP

## 2019-06-07 ENCOUNTER — Encounter: Payer: Self-pay | Admitting: Family

## 2019-06-07 ENCOUNTER — Ambulatory Visit (INDEPENDENT_AMBULATORY_CARE_PROVIDER_SITE_OTHER): Payer: Medicaid Other | Admitting: Family

## 2019-06-07 ENCOUNTER — Other Ambulatory Visit: Payer: Self-pay

## 2019-06-07 VITALS — BP 126/80 | HR 81 | Ht 69.0 in | Wt 263.4 lb

## 2019-06-07 DIAGNOSIS — R102 Pelvic and perineal pain: Secondary | ICD-10-CM | POA: Diagnosis not present

## 2019-06-07 DIAGNOSIS — Z113 Encounter for screening for infections with a predominantly sexual mode of transmission: Secondary | ICD-10-CM

## 2019-06-07 DIAGNOSIS — Z3202 Encounter for pregnancy test, result negative: Secondary | ICD-10-CM | POA: Diagnosis not present

## 2019-06-07 DIAGNOSIS — Z7251 High risk heterosexual behavior: Secondary | ICD-10-CM | POA: Diagnosis not present

## 2019-06-07 DIAGNOSIS — N898 Other specified noninflammatory disorders of vagina: Secondary | ICD-10-CM | POA: Diagnosis not present

## 2019-06-07 DIAGNOSIS — N730 Acute parametritis and pelvic cellulitis: Secondary | ICD-10-CM

## 2019-06-07 LAB — POCT URINE PREGNANCY: Preg Test, Ur: NEGATIVE

## 2019-06-07 MED ORDER — CEFTRIAXONE SODIUM 500 MG IJ SOLR
500.0000 mg | Freq: Once | INTRAMUSCULAR | Status: AC
  Administered 2019-06-07: 500 mg via INTRAMUSCULAR

## 2019-06-07 MED ORDER — ULIPRISTAL ACETATE 30 MG PO TABS: 1.0000 | ORAL_TABLET | Freq: Once | ORAL | 0 refills | Status: AC

## 2019-06-07 MED ORDER — DOXYCYCLINE MONOHYDRATE 100 MG PO TABS: 100.0000 mg | ORAL_TABLET | Freq: Two times a day (BID) | ORAL | 0 refills | Status: AC

## 2019-06-07 MED ORDER — METRONIDAZOLE 250 MG PO TABS
2000.0000 mg | ORAL_TABLET | Freq: Once | ORAL | Status: AC
  Administered 2019-06-07: 2000 mg via ORAL

## 2019-06-07 NOTE — Addendum Note (Signed)
Addended by: Alycia Patten on: 06/07/2019 02:59 PM   Modules accepted: Orders

## 2019-06-07 NOTE — Progress Notes (Signed)
History was provided by the patient.  [name] is a 19 y.o. female who is here for pelvic pain.   PCP confirmed? No. - has not made an appointment  Trude Mcburney, FNP  HPI:   - Presenting for right sided pelvic pain - Started 2 weeks ago - Worse with hip flexion - Endorses pain with sex with certain positions - Some vaginal clear/white vaginal discharge - Never took BV medication from last appointment - Did not take Plan B or PNV - Last unprotected sex yesterday - Yesterday temperature of 99.7 - At home pregnancy test was negative   ROS  - negative except as above.  Patient Active Problem List   Diagnosis Date Noted  . MDD (major depressive disorder), recurrent episode, severe ([place]) 12/27/2018  . Severe episode of recurrent major depressive disorder, without psychotic features ([place]) 10/10/2017  . Substance use disorder 06/08/2017  . Elevated blood pressure reading 04/13/2017  . 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 Hx of suicide attempt 03/19/2016  . PTSD (post-traumatic stress disorder) 03/19/2016  . Generalized anxiety disorder   . Suicidal ideation     Current Outpatient Medications on File Prior to Visit  Medication Sig Dispense Refill  . albuterol (VENTOLIN HFA) 108 (90 Base) MCG/ACT inhaler Inhale 1 puff into the lungs every 6 (six) hours as needed for wheezing or shortness of breath.    . ARIPiprazole (ABILIFY) 5 MG tablet Take 1 tablet (5 mg total) by mouth daily. For mood control 30 tablet 0  . busPIRone (BUSPAR) 10 MG tablet Take 10 mg by mouth 2 (two) times daily as needed for anxiety.    Marland Kitchen ibuprofen (ADVIL) 600 MG tablet Take 1,200 mg by mouth every 6 (six) hours as needed for fever, headache or moderate pain.    Marland Kitchen lamoTRIgine (LAMICTAL) 100 MG tablet Take 100 mg by mouth every morning.    . Pediatric Multiple Vit-C-FA (FLINSTONES GUMMIES OMEGA-3 DHA) CHEW Chew 1 tablet by mouth daily with breakfast. 90 tablet 1  . prazosin (MINIPRESS) 5 MG capsule Take 5 mg  by mouth at bedtime.    . traZODone (DESYREL) 100 MG tablet Take 4 tablets (400 mg total) by mouth at bedtime as needed for sleep. 30 tablet 0   No current facility-administered medications on file prior to visit.    Allergies  Allergen Reactions  . Lactose Intolerance (Gi) Nausea And Vomiting and Other (See Comments)  . Tape Other (See Comments)    Slight skin irritation    Physical Exam:  General: well appearing, no acute distress Heart: RRR, no M/R/G Pulm: CTAB Abdomen: soft, flat, mild tenderness with deep palpation of RLQ. No masses. + BS. GU: Bimanual exam with mucopurulent discharge and adnexal tenderness. No lesions or friability appreciated.  Skin: no rashes or lesions   Vitals:   06/07/19 0943  BP: 126/80  Pulse: 81  Weight: 263 lb 6.4 oz (119.5 kg)  Height: 5\' 9"  (1.753 m)    Growth percentile SmartLinks can only be used for patients less than 63 years old. No LMP recorded.  Assessment/Plan: Patient 19 yo F here for pelvic pain. Pain began 2 weeks ago, not sure if any event precipitated the event. Also endorsing dyspareunia, some white vaginal discharge with odor. Was previously prescribed Metro for BV treatment but prescription was never filled. Last unprotected sex was yesterday. Patient also had Nexplanon removed ~1 month ago. Pelvic pain concerning for PID given adnexal tenderness and mucopurulent discharge on bimanual exam. Will treat empirically.  MSK etiology also possible and this was discussed with patient. Low suspicion for appendicitis given grossly unremarkable abdominal exam and no true fever but will assess with CBC. Plan discussed with patient who is amenable to treatment. Will await results from STI testing and consider treatment of partner.   1. Pelvic pain - POCT urine pregnancy - CBC with Differential/Platelet  2. Acute pelvic inflammatory disease (PID) - cefTRIAXone (ROCEPHIN) injection 500 mg - doxycycline (ADOXA) 100 MG tablet; Take 1 tablet  (100 mg total) by mouth 2 (two) times daily for 14 days.  Dispense: 28 tablet; Refill: 0  3. Unprotected sexual intercourse - HIV antibody (with reflex) - ulipristal acetate (ELLA) 30 MG tablet; Take 1 tablet (30 mg total) by mouth once for 1 dose.  Dispense: 1 tablet; Refill: 0  4. Vaginal discharge - Urine cytology ancillary only - WET PREP BY MOLECULAR PROBE - metroNIDAZOLE (FLAGYL) tablet 2,000 mg  5. Routine screening for STI (sexually transmitted infection) - C. trachomatis/N. gonorrhoeae RNA  Ellin Mayhew, MD Pediatrics PGY 1  Supervising Provider Co-Signature  I reviewed with the resident the medical history and the resident's findings on physical examination.  I discussed with the resident the patient's diagnosis and concur with the treatment plan as documented in the resident's note.  Georges Mouse, NP

## 2019-06-07 NOTE — Patient Instructions (Addendum)
It was a pleasure seeing you today. Please call the [facility] at [facility]: [phone] to set up an appointment with a primary care physician.  - You were prescribed one medication Doxycycline. You should take this medication every day, twice a day, for the next two weeks.  - I sent a prescription for Plan B that can be picked up from your pharmacy and used as needed.  - You can take over the counter Ibuprofen or Tylenol for the pelvic pain. If the pain does not improve within 5 days, please call and make an appointment with the [facility], listed above.

## 2019-06-08 ENCOUNTER — Encounter: Payer: Self-pay | Admitting: Family

## 2019-06-08 LAB — CBC WITH DIFFERENTIAL/PLATELET
Absolute Monocytes: 436 cells/uL (ref 200–950)
Basophils Absolute: 20 cells/uL (ref 0–200)
Basophils Relative: 0.3 %
Eosinophils Absolute: 79 cells/uL (ref 15–500)
Eosinophils Relative: 1.2 %
HCT: 43.1 % (ref 35.0–45.0)
Hemoglobin: 14.2 g/dL (ref 11.7–15.5)
Lymphs Abs: 1630 cells/uL (ref 850–3900)
MCH: 30.3 pg (ref 27.0–33.0)
MCHC: 32.9 g/dL (ref 32.0–36.0)
MCV: 92.1 fL (ref 80.0–100.0)
MPV: 11.9 fL (ref 7.5–12.5)
Monocytes Relative: 6.6 %
Neutro Abs: 4435 cells/uL (ref 1500–7800)
Neutrophils Relative %: 67.2 %
Platelets: 216 10*3/uL (ref 140–400)
RBC: 4.68 10*6/uL (ref 3.80–5.10)
RDW: 12.6 % (ref 11.0–15.0)
Total Lymphocyte: 24.7 %
WBC: 6.6 10*3/uL (ref 3.8–10.8)

## 2019-06-08 LAB — C. TRACHOMATIS/N. GONORRHOEAE RNA
C. trachomatis RNA, TMA: NOT DETECTED
N. gonorrhoeae RNA, TMA: NOT DETECTED

## 2019-06-08 LAB — HIV ANTIBODY (ROUTINE TESTING W REFLEX): HIV 1&2 Ab, 4th Generation: NONREACTIVE

## 2019-06-08 LAB — WET PREP BY MOLECULAR PROBE
Candida species: NOT DETECTED
MICRO NUMBER:: 10474255
SPECIMEN QUALITY:: ADEQUATE
Trichomonas vaginosis: NOT DETECTED

## 2019-06-11 ENCOUNTER — Telehealth: Payer: Self-pay | Admitting: Pediatrics

## 2019-06-11 ENCOUNTER — Other Ambulatory Visit: Payer: Self-pay | Admitting: Pediatrics

## 2019-06-11 MED ORDER — LEVONORGESTREL 1.5 MG PO TABS: 1.5000 mg | ORAL_TABLET | Freq: Once | ORAL | 0 refills | Status: AC

## 2019-06-11 NOTE — Telephone Encounter (Signed)
Which rx is she trying to fill? She has plan b at [facility] on [address]

## 2019-06-11 NOTE — Telephone Encounter (Signed)
Tarika called and states she is having trouble picking up a prescription at pharmacy. Please call.

## 2019-06-11 NOTE — Telephone Encounter (Signed)
Sent MyChart message to patient

## 2019-06-18 ENCOUNTER — Other Ambulatory Visit: Payer: Self-pay | Admitting: Pediatrics

## 2019-06-18 MED ORDER — METRONIDAZOLE 500 MG PO TABS
500.0000 mg | ORAL_TABLET | Freq: Two times a day (BID) | ORAL | 0 refills | Status: AC
Start: 2019-06-18 — End: 2019-06-25

## 2019-06-18 MED ORDER — ELLA 30 MG PO TABS: 1.0000 | ORAL_TABLET | Freq: Once | ORAL | 0 refills | Status: AC

## 2019-06-22 ENCOUNTER — Encounter: Payer: Self-pay | Admitting: Pediatrics

## 2019-06-26 ENCOUNTER — Ambulatory Visit (HOSPITAL_COMMUNITY)
Admission: EM | Admit: 2019-06-26 | Discharge: 2019-06-26 | Disposition: A | Discharge status: Home / Self Care | Payer: Medicaid Other | Attending: Family Medicine | Admitting: Family Medicine

## 2019-06-26 ENCOUNTER — Other Ambulatory Visit: Payer: Self-pay

## 2019-06-26 ENCOUNTER — Encounter (HOSPITAL_COMMUNITY): Payer: Self-pay

## 2019-06-26 ENCOUNTER — Emergency Department (HOSPITAL_COMMUNITY)
Admission: EM | Admit: 2019-06-26 | Discharge: 2019-06-27 | Disposition: A | Discharge status: Home / Self Care | Payer: Medicaid Other | Attending: Emergency Medicine | Admitting: Emergency Medicine

## 2019-06-26 ENCOUNTER — Encounter (HOSPITAL_COMMUNITY): Payer: Self-pay | Admitting: Orthopedic Surgery

## 2019-06-26 DIAGNOSIS — F17210 Nicotine dependence, cigarettes, uncomplicated: Secondary | ICD-10-CM | POA: Diagnosis not present

## 2019-06-26 DIAGNOSIS — F4310 Post-traumatic stress disorder, unspecified: Secondary | ICD-10-CM | POA: Diagnosis not present

## 2019-06-26 DIAGNOSIS — Z20822 Contact with and (suspected) exposure to covid-19: Secondary | ICD-10-CM | POA: Insufficient documentation

## 2019-06-26 DIAGNOSIS — F332 Major depressive disorder, recurrent severe without psychotic features: Secondary | ICD-10-CM | POA: Diagnosis not present

## 2019-06-26 DIAGNOSIS — F411 Generalized anxiety disorder: Secondary | ICD-10-CM | POA: Diagnosis not present

## 2019-06-26 DIAGNOSIS — H6693 Otitis media, unspecified, bilateral: Secondary | ICD-10-CM | POA: Insufficient documentation

## 2019-06-26 DIAGNOSIS — R109 Unspecified abdominal pain: Secondary | ICD-10-CM | POA: Insufficient documentation

## 2019-06-26 DIAGNOSIS — Z79899 Other long term (current) drug therapy: Secondary | ICD-10-CM | POA: Diagnosis not present

## 2019-06-26 DIAGNOSIS — R1013 Epigastric pain: Secondary | ICD-10-CM | POA: Insufficient documentation

## 2019-06-26 DIAGNOSIS — J45909 Unspecified asthma, uncomplicated: Secondary | ICD-10-CM | POA: Insufficient documentation

## 2019-06-26 HISTORY — DX: Unspecified asthma, uncomplicated: J45.909

## 2019-06-26 LAB — COMPREHENSIVE METABOLIC PANEL
ALT: 31 U/L (ref 0–44)
AST: 29 U/L (ref 15–41)
Albumin: 4 g/dL (ref 3.5–5.0)
Alkaline Phosphatase: 59 U/L (ref 38–126)
Anion gap: 12 (ref 5–15)
BUN: 15 mg/dL (ref 6–20)
CO2: 23 mmol/L (ref 22–32)
Calcium: 8.8 mg/dL — ABNORMAL LOW (ref 8.9–10.3)
Chloride: 104 mmol/L (ref 98–111)
Creatinine, Ser: 0.8 mg/dL (ref 0.44–1.00)
GFR calc Af Amer: >60 mL/min (ref >60)
GFR calc non Af Amer: >60 mL/min (ref >60)
Glucose, Bld: 109 mg/dL — ABNORMAL HIGH (ref 70–99)
Potassium: 4.4 mmol/L (ref 3.5–5.1)
Sodium: 139 mmol/L (ref 135–145)
Total Bilirubin: 0.3 mg/dL (ref 0.3–1.2)
Total Protein: 7 g/dL (ref 6.5–8.1)

## 2019-06-26 LAB — CBC
HCT: 41.7 % (ref 36.0–46.0)
Hemoglobin: 13.6 g/dL (ref 12.0–15.0)
MCH: 30.6 pg (ref 26.0–34.0)
MCHC: 32.6 g/dL (ref 30.0–36.0)
MCV: 93.7 fL (ref 80.0–100.0)
Platelets: 200 10*3/uL (ref 150–400)
RBC: 4.45 MIL/uL (ref 3.87–5.11)
RDW: 12.9 % (ref 11.5–15.5)
WBC: 12 10*3/uL — ABNORMAL HIGH (ref 4.0–10.5)
nRBC: 0 % (ref 0.0–0.2)

## 2019-06-26 LAB — I-STAT BETA HCG BLOOD, ED (MC, WL, AP ONLY): I-stat hCG, quantitative: <5 m[IU]/mL (ref <5)

## 2019-06-26 LAB — LIPASE, BLOOD: Lipase: 23 U/L (ref 11–51)

## 2019-06-26 MED ORDER — CETIRIZINE HCL 10 MG PO TABS
10.0000 mg | ORAL_TABLET | Freq: Every day | ORAL | 0 refills | Status: DC
Start: 2019-06-26 — End: 2020-03-12

## 2019-06-26 MED ORDER — FLUTICASONE PROPIONATE 50 MCG/ACT NA SUSP
1.0000 | Freq: Every day | NASAL | 2 refills | Status: DC
Start: 2019-06-26 — End: 2020-03-12

## 2019-06-26 MED ORDER — IBUPROFEN 600 MG PO TABS
600.0000 mg | ORAL_TABLET | Freq: Three times a day (TID) | ORAL | 0 refills | Status: DC | PRN
Start: 2019-06-26 — End: 2019-11-27

## 2019-06-26 MED ORDER — AMOXICILLIN 500 MG PO CAPS
1000.0000 mg | ORAL_CAPSULE | Freq: Three times a day (TID) | ORAL | 0 refills | Status: AC
Start: 2019-06-26 — End: 2019-07-03

## 2019-06-26 MED ORDER — SODIUM CHLORIDE 0.9% FLUSH: 3.0000 mL | Freq: Once | INTRAVENOUS | Status: DC

## 2019-06-26 NOTE — ED Provider Notes (Signed)
[facility]    CSN: [id number] Arrival date & time: 06/26/19  1001      History   Chief Complaint No chief complaint on file.   HPI [name] is a 19 y.o. female.   Patient is a 19 year old female with past medical history of anxiety, asthma, headache, depression, PTSD.  She presents today with approximately 2 to 3 days of nasal congestion, head congestion.  Was using Sudafed for her symptoms with some relief and then started having severe bilateral ear pain.  Worse on the right.  Mild nausea but denies any vomiting, diarrhea, shortness of breath, fever or abdominal pain.  No loss of taste or smell.  History of allergies.  Recently traveled to [place] on vacation.  Did go snorkeling.  ROS per HPI      Past Medical History:  Diagnosis Date  . Anxiety   . Asthma   . Headache(784.0)   . Hx of suicide attempt   . Major depressive disorder   . PTSD (post-traumatic stress disorder)     Patient Active Problem List   Diagnosis Date Noted  . MDD (major depressive disorder), recurrent episode, severe ([place]) 12/27/2018  . Severe episode of recurrent major depressive disorder, without psychotic features ([place]) 10/10/2017  . Substance use disorder 06/08/2017  . Elevated blood pressure reading 04/13/2017  . 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 Hx of suicide attempt 03/19/2016  . PTSD (post-traumatic stress disorder) 03/19/2016  . Generalized anxiety disorder   . Suicidal ideation     History reviewed. No pertinent surgical history.  OB History    Gravida  1   Para  1   Term  1   Preterm  0   AB  0   Living  1     SAB  0   TAB  0   Ectopic  0   Multiple  0   Live Births  1            Home Medications    Prior to Admission medications   Medication Sig Start Date End Date Taking? Authorizing Provider  ARIPiprazole (ABILIFY) 5 MG tablet Take 1 tablet (5 mg total) by mouth daily. For mood control 12/31/18  Yes Nwoko, Herbert Pun I, NP  busPIRone (BUSPAR)  10 MG tablet Take 10 mg by mouth 2 (two) times daily as needed for anxiety. 01/22/19  Yes [provider]  lamoTRIgine (LAMICTAL) 100 MG tablet Take 100 mg by mouth every morning. 02/15/19  Yes [provider]  prazosin (MINIPRESS) 5 MG capsule Take 5 mg by mouth at bedtime. 02/15/19  Yes [provider]  traZODone (DESYREL) 100 MG tablet Take 4 tablets (400 mg total) by mouth at bedtime as needed for sleep. 12/30/18  Yes Lindell Spar I, NP  albuterol (VENTOLIN HFA) 108 (90 Base) MCG/ACT inhaler Inhale 1 puff into the lungs every 6 (six) hours as needed for wheezing or shortness of breath. 05/11/19   Trude Mcburney, FNP  amoxicillin (AMOXIL) 500 MG capsule Take 2 capsules (1,000 mg total) by mouth 3 (three) times daily for 7 days. 06/26/19 07/03/19  Loura Halt A, NP  cetirizine (ZYRTEC) 10 MG tablet Take 1 tablet (10 mg total) by mouth daily. 06/26/19   Teckla Christiansen, Tressia Miners A, NP  fluticasone (FLONASE) 50 MCG/ACT nasal spray Place 1 spray into both nostrils daily. 06/26/19   Loura Halt A, NP  ibuprofen (ADVIL) 600 MG tablet Take 1 tablet (600 mg total) by mouth every 8 (eight) hours  as needed for moderate pain. 06/26/19   Janace Aris, NP  Pediatric Multiple Vit-C-FA Asencion Islam GUMMIES OMEGA-3 DHA) CHEW Chew 1 tablet by mouth daily with breakfast. 05/18/19   Georges Mouse, NP    Family History Family History  Problem Relation Age of Onset  . Diabetes Maternal Aunt   . Diabetes Maternal Grandmother   . Cancer Maternal Grandmother   . Breast cancer Other     Social History Social History   Tobacco Use  . Smoking status: Current Every Day Smoker    Packs/day: 0.50    Types: Cigarettes, Cigars  . Smokeless tobacco: Never Used  . Tobacco comment: black and mild  Substance Use Topics  . Alcohol use: Yes    Comment: socially  . Drug use: Yes    Types: Marijuana, MDMA (Ecstacy)     Allergies   Lactose intolerance (gi) and Tape   Review of Systems Review of  Systems   Physical Exam Triage Vital Signs ED Triage Vitals  Enc Vitals Group     BP 06/26/19 1109 (!) 134/91     Pulse Rate 06/26/19 1109 82     Resp --      Temp 06/26/19 1109 98.7 F (37.1 C)     Temp Source 06/26/19 1109 Oral     SpO2 06/26/19 1109 100 %     Weight --      Height --      Head Circumference --      Peak Flow --      Pain Score 06/26/19 1105 0     Pain Loc --      Pain Edu? --      Excl. in GC? --    No data found.  Updated Vital Signs BP (!) 134/91 (BP Location: Left Arm)   Pulse 82   Temp 98.7 F (37.1 C) (Oral)   LMP 06/11/2019   SpO2 100%   Visual Acuity Right Eye Distance:   Left Eye Distance:   Bilateral Distance:    Right Eye Near:   Left Eye Near:    Bilateral Near:     Physical Exam Vitals and nursing note reviewed.  Constitutional:      General: She is not in acute distress.    Appearance: Normal appearance. She is not ill-appearing, toxic-appearing or diaphoretic.  HENT:     Head: Normocephalic.     Right Ear: Tympanic membrane is erythematous and bulging.     Left Ear: Tympanic membrane is erythematous and retracted.     Nose: Congestion and rhinorrhea present.  Eyes:     Conjunctiva/sclera: Conjunctivae normal.  Cardiovascular:     Rate and Rhythm: Normal rate and regular rhythm.     Pulses: Normal pulses.     Heart sounds: Normal heart sounds.  Pulmonary:     Effort: Pulmonary effort is normal.     Breath sounds: Normal breath sounds.  Musculoskeletal:        General: Normal range of motion.     Cervical back: Normal range of motion.  Skin:    General: Skin is warm and dry.     Findings: No rash.  Neurological:     Mental Status: She is alert.  Psychiatric:        Mood and Affect: Mood normal.      UC Treatments / Results  Labs (all labs ordered are listed, but only abnormal results are displayed) Labs Reviewed  SARS CORONAVIRUS 2 (TAT 6-24 HRS)  EKG   Radiology No results  found.  Procedures Procedures (including critical care time)  Medications Ordered in UC Medications - No data to display  Initial Impression / Assessment and Plan / UC Course  I have reviewed the triage vital signs and the nursing notes.  Pertinent labs & imaging results that were available during my care of the patient were reviewed by me and considered in my medical decision making (see chart for details).     Bilateral otitis media Treated with amoxicillin.  Ibuprofen for pain as needed.   Flonase and Zyrtec for allergy symptoms Follow up as needed for continued or worsening symptoms  Final Clinical Impressions(s) / UC Diagnoses   Final diagnoses:  Bilateral otitis media, unspecified otitis media type     Discharge Instructions     Treating you for a bilateral ear infection Take the amoxicillin as prescribed.  Ibuprofen for pain as needed. Flonase and Zyrtec for allergy symptoms Follow up as needed for continued or worsening symptoms      ED Prescriptions    Medication Sig Dispense Auth. Provider   amoxicillin (AMOXIL) 500 MG capsule Take 2 capsules (1,000 mg total) by mouth 3 (three) times daily for 7 days. 42 capsule Shakeila Pfarr A, NP   cetirizine (ZYRTEC) 10 MG tablet Take 1 tablet (10 mg total) by mouth daily. 30 tablet Jered Heiny A, NP   fluticasone (FLONASE) 50 MCG/ACT nasal spray Place 1 spray into both nostrils daily. 16 g Neomi Laidler A, NP   ibuprofen (ADVIL) 600 MG tablet Take 1 tablet (600 mg total) by mouth every 8 (eight) hours as needed for moderate pain. 30 tablet Dahlia Byes A, NP     PDMP not reviewed this encounter.   Janace Aris, NP 06/26/19 1145

## 2019-06-26 NOTE — ED Triage Notes (Signed)
Patient arrived stating she has sudden onset of generalized burning abdominal pain an hour ago. Declines any NVD. Patient seen today for a cold/ear infection. Patient crying on phone during triage.

## 2019-06-26 NOTE — Discharge Instructions (Addendum)
Treating you for a bilateral ear infection Take the amoxicillin as prescribed.  Ibuprofen for pain as needed. Flonase and Zyrtec for allergy symptoms Follow up as needed for continued or worsening symptoms

## 2019-06-26 NOTE — ED Triage Notes (Signed)
Pt reports she went to [place] May 26th-30th on vacation, she went snorkling, in and out of the pool.   Pt reports bilateral ear pain and head congestion.  Symptoms started on the 30th with chills.   Some nausea, denies SOB,fever,abdominal pain, V/D.

## 2019-06-27 ENCOUNTER — Emergency Department (HOSPITAL_COMMUNITY): Payer: Medicaid Other

## 2019-06-27 ENCOUNTER — Encounter (HOSPITAL_COMMUNITY): Payer: Self-pay | Admitting: Emergency Medicine

## 2019-06-27 ENCOUNTER — Emergency Department (HOSPITAL_COMMUNITY)
Admission: EM | Admit: 2019-06-27 | Discharge: 2019-06-27 | Disposition: A | Discharge status: Home / Self Care | Payer: Medicaid Other | Source: Home / Self Care | Attending: Emergency Medicine | Admitting: Emergency Medicine

## 2019-06-27 DIAGNOSIS — R109 Unspecified abdominal pain: Secondary | ICD-10-CM

## 2019-06-27 DIAGNOSIS — R1013 Epigastric pain: Secondary | ICD-10-CM

## 2019-06-27 LAB — URINALYSIS, ROUTINE W REFLEX MICROSCOPIC
Bilirubin Urine: NEGATIVE
Glucose, UA: NEGATIVE mg/dL
Hgb urine dipstick: NEGATIVE
Ketones, ur: NEGATIVE mg/dL
Leukocytes,Ua: NEGATIVE
Nitrite: NEGATIVE
Protein, ur: 30 mg/dL — AB
Specific Gravity, Urine: 1.025 (ref 1.005–1.030)
pH: 6 (ref 5.0–8.0)

## 2019-06-27 LAB — SARS CORONAVIRUS 2 (TAT 6-24 HRS): SARS Coronavirus 2: NEGATIVE

## 2019-06-27 MED ORDER — ONDANSETRON HCL 4 MG PO TABS
4.0000 mg | ORAL_TABLET | Freq: Three times a day (TID) | ORAL | 0 refills | Status: DC | PRN
Start: 2019-06-27 — End: 2019-11-27

## 2019-06-27 MED ORDER — ALUM & MAG HYDROXIDE-SIMETH 400-400-40 MG/5ML PO SUSP: 5.0000 mL | Freq: Four times a day (QID) | ORAL | 0 refills | Status: DC | PRN

## 2019-06-27 MED ORDER — FAMOTIDINE 20 MG PO TABS
20.0000 mg | ORAL_TABLET | Freq: Every day | ORAL | 0 refills | Status: DC
Start: 2019-06-27 — End: 2019-11-27

## 2019-06-27 MED ORDER — FAMOTIDINE IN NACL 20-0.9 MG/50ML-% IV SOLN
20.0000 mg | Freq: Once | INTRAVENOUS | Status: AC
  Administered 2019-06-27: 20 mg via INTRAVENOUS
  Filled 2019-06-27: qty 50

## 2019-06-27 MED ORDER — ONDANSETRON HCL 4 MG/2ML IJ SOLN
4.0000 mg | Freq: Once | INTRAMUSCULAR | Status: AC
  Administered 2019-06-27: 4 mg via INTRAVENOUS
  Filled 2019-06-27: qty 2

## 2019-06-27 MED ORDER — IOHEXOL 300 MG/ML  SOLN
100.0000 mL | Freq: Once | INTRAMUSCULAR | Status: AC | PRN
  Administered 2019-06-27: 100 mL via INTRAVENOUS

## 2019-06-27 NOTE — ED Provider Notes (Signed)
[facility] EMERGENCY DEPARTMENT Provider Note   CSN: [id number] Arrival date & time: 06/27/19  1441     History Chief Complaint  Patient presents with  . Abdominal Pain    [name] is a 19 y.o. female presenting for evaluation of abdominal pain.  Patient states last night she developed epigastric abdominal burning.  She has associated nausea, no vomiting.  She also reports for the past 2 weeks, she has been having right lower abdominal pain.  This is constant, nothing makes it better or worse.  She denies fevers, chills, urinary symptoms, abnormal bowel movements.  Patient states since her symptoms began 2 weeks ago, she has been evaluated for STDs, was found to have BV, treated, discharge has since resolved.  She has no further vaginal discharge.  She does not want to be retested for STDs today.  She reports no other abdominal problems, has never had abdominal surgeries.  She was started on an antibiotic for ear infections yesterday, has had multiple doses, states her symptoms did not worsen after taking the medication.  She reports multiple sick contacts, one that has URI symptoms such as herself, and one who has diarrhea.  Additional history obtained from chart review.  Patient with a history of anxiety, asthma, depression.  HPI     Past Medical History:  Diagnosis Date  . Anxiety   . Asthma   . Headache(784.0)   . Hx of suicide attempt   . Major depressive disorder   . PTSD (post-traumatic stress disorder)     Patient Active Problem List   Diagnosis Date Noted  . MDD (major depressive disorder), recurrent episode, severe (HCC) 12/27/2018  . Severe episode of recurrent major depressive disorder, without psychotic features (HCC) 10/10/2017  . Substance use disorder 06/08/2017  . Elevated blood pressure reading 04/13/2017  . 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 Hx of suicide attempt 03/19/2016  . PTSD (post-traumatic stress disorder) 03/19/2016  . Generalized  anxiety disorder   . Suicidal ideation     History reviewed. No pertinent surgical history.   OB History    Gravida  1   Para  1   Term  1   Preterm  0   AB  0   Living  1     SAB  0   TAB  0   Ectopic  0   Multiple  0   Live Births  1           Family History  Problem Relation Age of Onset  . Diabetes Maternal Aunt   . Diabetes Maternal Grandmother   . Cancer Maternal Grandmother   . Breast cancer Other     Social History   Tobacco Use  . Smoking status: Current Every Day Smoker    Packs/day: 0.50    Types: Cigarettes, Cigars  . Smokeless tobacco: Never Used  . Tobacco comment: black and mild  Substance Use Topics  . Alcohol use: Yes    Comment: socially  . Drug use: Yes    Types: Marijuana, MDMA (Ecstacy)    Home Medications Prior to Admission medications   Medication Sig Start Date End Date Taking? Authorizing Provider  albuterol (VENTOLIN HFA) 108 (90 Base) MCG/ACT inhaler Inhale 1 puff into the lungs every 6 (six) hours as needed for wheezing or shortness of breath. 05/11/19   [name], FNP  alum & mag hydroxide-simeth (MAALOX ADVANCED MAX ST) 400-400-40 MG/5ML suspension Take 5 mLs by mouth every 6 (six)  hours as needed for indigestion. 06/27/19   Derrian Poli, PA-C  amoxicillin (AMOXIL) 500 MG capsule Take 2 capsules (1,000 mg total) by mouth 3 (three) times daily for 7 days. 06/26/19 07/03/19  Dahlia Byes A, NP  ARIPiprazole (ABILIFY) 5 MG tablet Take 1 tablet (5 mg total) by mouth daily. For mood control 12/31/18   Armandina Stammer I, NP  busPIRone (BUSPAR) 10 MG tablet Take 10 mg by mouth 2 (two) times daily as needed for anxiety. 01/22/19   [provider]  cetirizine (ZYRTEC) 10 MG tablet Take 1 tablet (10 mg total) by mouth daily. 06/26/19   Dahlia Byes A, NP  famotidine (PEPCID) 20 MG tablet Take 1 tablet (20 mg total) by mouth daily for 7 days. 06/27/19 07/04/19  Kindred Heying, PA-C  fluticasone (FLONASE) 50 MCG/ACT nasal  spray Place 1 spray into both nostrils daily. 06/26/19   Dahlia Byes A, NP  ibuprofen (ADVIL) 600 MG tablet Take 1 tablet (600 mg total) by mouth every 8 (eight) hours as needed for moderate pain. 06/26/19   Dahlia Byes A, NP  lamoTRIgine (LAMICTAL) 100 MG tablet Take 100 mg by mouth every morning. 02/15/19   [provider]  ondansetron (ZOFRAN) 4 MG tablet Take 1 tablet (4 mg total) by mouth every 8 (eight) hours as needed for nausea or vomiting. 06/27/19   Alveria Apley, PA-C  Pediatric Multiple Vit-C-FA (FLINSTONES GUMMIES OMEGA-3 DHA) CHEW Chew 1 tablet by mouth daily with breakfast. 05/18/19   Georges Mouse, NP  prazosin (MINIPRESS) 5 MG capsule Take 5 mg by mouth at bedtime. 02/15/19   [provider]  traZODone (DESYREL) 100 MG tablet Take 4 tablets (400 mg total) by mouth at bedtime as needed for sleep. 12/30/18   Armandina Stammer I, NP    Allergies    Lactose intolerance (gi) and Tape  Review of Systems   Review of Systems  Gastrointestinal: Positive for abdominal pain and nausea.  All other systems reviewed and are negative.   Physical Exam Updated Vital Signs BP (!) 107/54   Pulse 80   Temp 98.7 F (37.1 C) (Oral)   Resp 16   LMP 06/11/2019   SpO2 99%   Physical Exam Vitals and nursing note reviewed.  Constitutional:      General: She is not in acute distress.    Appearance: She is well-developed. She is obese.     Comments: Resting in the bed in no acute distress  HENT:     Head: Normocephalic and atraumatic.  Eyes:     Conjunctiva/sclera: Conjunctivae normal.     Pupils: Pupils are equal, round, and reactive to light.  Cardiovascular:     Rate and Rhythm: Normal rate and regular rhythm.     Pulses: Normal pulses.  Pulmonary:     Effort: Pulmonary effort is normal. No respiratory distress.     Breath sounds: Normal breath sounds. No wheezing.  Abdominal:     General: There is no distension.     Palpations: Abdomen is soft. There is no mass.      Tenderness: There is abdominal tenderness. There is no guarding or rebound.     Comments: Tenderness palpation of right lower quadrant abdomen.  No tenderness palpation elsewhere in the abdomen.  No rigidity, guarding, distention.  Negative rebound.  No peritonitis.  Musculoskeletal:        General: Normal range of motion.     Cervical back: Normal range of motion and neck supple.  Skin:  General: Skin is warm and dry.     Capillary Refill: Capillary refill takes less than 2 seconds.  Neurological:     Mental Status: She is alert and oriented to person, place, and time.     ED Results / Procedures / Treatments   Labs (all labs ordered are listed, but only abnormal results are displayed) Labs Reviewed - No data to display  EKG None  Radiology CT ABDOMEN PELVIS W CONTRAST  Result Date: 06/27/2019 CLINICAL DATA:  Right-sided abdominal pain for several days with nausea, initial encounter EXAM: CT ABDOMEN AND PELVIS WITH CONTRAST TECHNIQUE: Multidetector CT imaging of the abdomen and pelvis was performed using the standard protocol following bolus administration of intravenous contrast. CONTRAST:  OMNIPAQUE IOHEXOL 300 MG/ML  SOLN COMPARISON:  11/12/2018 FINDINGS: Lower chest: No acute abnormality. Hepatobiliary: No focal liver abnormality is seen. No gallstones, gallbladder wall thickening, or biliary dilatation. Pancreas: Unremarkable. No pancreatic ductal dilatation or surrounding inflammatory changes. Spleen: Normal in size without focal abnormality. Adrenals/Urinary Tract: Adrenal glands are unremarkable. Kidneys demonstrate a normal enhancement pattern. No renal calculi are seen. No obstructive changes are noted. The bladder is well distended. Stomach/Bowel: The appendix is well visualized and air filled without inflammatory change. The colon shows no obstructive or inflammatory changes. The stomach and small bowel are within normal limits. Vascular/Lymphatic: No significant vascular  findings are present. No enlarged abdominal or pelvic lymph nodes. Reproductive: Uterus and bilateral adnexa are unremarkable. Other: No abdominal wall hernia or abnormality. No abdominopelvic ascites. Musculoskeletal: No acute or significant osseous findings. IMPRESSION: Normal-appearing appendix. No acute abnormality noted to correspond with the patient's given clinical symptomatology. Electronically Signed   By: Alcide Clever M.D.   On: 06/27/2019 19:22    Procedures Procedures (including critical care time)  Medications Ordered in ED Medications  ondansetron (ZOFRAN) injection 4 mg (4 mg Intravenous Given 06/27/19 1746)  famotidine (PEPCID) IVPB 20 mg premix (0 mg Intravenous Stopped 06/27/19 1812)  iohexol (OMNIPAQUE) 300 MG/ML solution 100 mL (100 mLs Intravenous Contrast Given 06/27/19 1853)    ED Course  I have reviewed the triage vital signs and the nursing notes.  Pertinent labs & imaging results that were available during my care of the patient were reviewed by me and considered in my medical decision making (see chart for details).    MDM Rules/Calculators/A&P                      Patient presenting for evaluation of epigastric burning and right lower abdominal pain.  On exam, patient appears nontoxic.  However she does have focal right lower quadrant abdominal pain.  Concern for possible appendicitis.  Also consider GERD/gastritis.  Consider side effects from abx. Consider viral GI illness.  Labs obtained last night in the ER prior to patient being seen were reviewed by me.  Shows mild leukocytosis of 12, otherwise reassuring.  No electrolyte abnormality, kidney, liver, pancreatic function normal.  Will give Zofran and Pepcid for symptom control.  Will obtain a CT to rule out appendicitis.  CT abdomen pelvis negative for acute findings such as appendicitis.  On reassessment, patient report symptoms are much improved with the medications.  Discussed continued symptomatic treatment at  home with Pepcid.  Zofran and Maalox as needed for symptom control.  At this time, patient appears safe for discharge.  Return precautions given.  Patient states she understands and agrees to plan.  Final Clinical Impression(s) / ED Diagnoses Final diagnoses:  Epigastric  burning sensation  Right sided abdominal pain    Rx / DC Orders ED Discharge Orders         Ordered    famotidine (PEPCID) 20 MG tablet  Daily     06/27/19 2028    ondansetron (ZOFRAN) 4 MG tablet  Every 8 hours PRN     06/27/19 2028    alum & mag hydroxide-simeth (MAALOX ADVANCED MAX ST) 253-664-40 MG/5ML suspension  Every 6 hours PRN     06/27/19 2028           Franchot Heidelberg, PA-C 06/27/19 2058    Blanchie Dessert, MD 06/28/19 2042

## 2019-06-27 NOTE — ED Notes (Signed)
Pt asking for juice  No results yet

## 2019-06-27 NOTE — Discharge Instructions (Signed)
Take pepcid daily to decrease stomach acid.  Take zofran as needed for nausea. Take maalox as needed for breakthrough stomach burning.  Be careful with your diet. Do not eat spicy, acidic, or greasy foods, as this may make your symptoms worse.  Return to the ER if you develop fevers, persistent vomiting, severe/worsening abd pain, blood in your stool, or any new, worsening, or concerning symptoms.

## 2019-06-27 NOTE — ED Triage Notes (Signed)
Pt arrives to ED with c/o of abd pain that started last night was seen at [facility] had blood drawn and urine taken. Pt was unable to wait due to long wait.

## 2019-06-27 NOTE — ED Notes (Signed)
2 uNSUCCESSFUL ATTEMPTS TO START AN IV

## 2019-06-27 NOTE — ED Notes (Signed)
Pt made her way to the nurses station in triage and began to tell the staff that she's been waiting for 6 hours in pain and that we didn't care about her The process was explained to her and assured her that her bloodwork has been done and her vitals were stable

## 2019-06-27 NOTE — ED Notes (Signed)
The [t has had abd pain for 2 days with some nausea and vomiting

## 2019-06-27 NOTE — ED Notes (Signed)
MED GIVEN

## 2019-07-02 ENCOUNTER — Encounter: Payer: Self-pay | Admitting: Family

## 2019-07-02 ENCOUNTER — Other Ambulatory Visit: Payer: Self-pay | Admitting: Pediatrics

## 2019-07-02 MED ORDER — FLUCONAZOLE 150 MG PO TABS
ORAL_TABLET | ORAL | 0 refills | Status: DC
Start: 2019-07-02 — End: 2019-11-27

## 2019-10-16 ENCOUNTER — Emergency Department (HOSPITAL_COMMUNITY)
Admission: EM | Admit: 2019-10-16 | Discharge: 2019-10-16 | Disposition: A | Discharge status: Home / Self Care | Payer: Medicaid Other | Attending: Emergency Medicine | Admitting: Emergency Medicine

## 2019-10-16 ENCOUNTER — Other Ambulatory Visit: Payer: Self-pay

## 2019-10-16 ENCOUNTER — Encounter (HOSPITAL_COMMUNITY): Payer: Self-pay

## 2019-10-16 DIAGNOSIS — R109 Unspecified abdominal pain: Secondary | ICD-10-CM | POA: Diagnosis present

## 2019-10-16 DIAGNOSIS — R6883 Chills (without fever): Secondary | ICD-10-CM | POA: Insufficient documentation

## 2019-10-16 DIAGNOSIS — Z20822 Contact with and (suspected) exposure to covid-19: Secondary | ICD-10-CM | POA: Diagnosis not present

## 2019-10-16 DIAGNOSIS — Z5321 Procedure and treatment not carried out due to patient leaving prior to being seen by health care provider: Secondary | ICD-10-CM | POA: Diagnosis not present

## 2019-10-16 LAB — COMPREHENSIVE METABOLIC PANEL
ALT: 25 U/L (ref 0–44)
AST: 21 U/L (ref 15–41)
Albumin: 3.9 g/dL (ref 3.5–5.0)
Alkaline Phosphatase: 59 U/L (ref 38–126)
Anion gap: 7 (ref 5–15)
BUN: 7 mg/dL (ref 6–20)
CO2: 27 mmol/L (ref 22–32)
Calcium: 9.2 mg/dL (ref 8.9–10.3)
Chloride: 106 mmol/L (ref 98–111)
Creatinine, Ser: 0.83 mg/dL (ref 0.44–1.00)
GFR calc Af Amer: >60 mL/min (ref >60)
GFR calc non Af Amer: >60 mL/min (ref >60)
Glucose, Bld: 85 mg/dL (ref 70–99)
Potassium: 4 mmol/L (ref 3.5–5.1)
Sodium: 140 mmol/L (ref 135–145)
Total Bilirubin: 0.5 mg/dL (ref 0.3–1.2)
Total Protein: 6.9 g/dL (ref 6.5–8.1)

## 2019-10-16 LAB — CBC
HCT: 41.9 % (ref 36.0–46.0)
Hemoglobin: 13.6 g/dL (ref 12.0–15.0)
MCH: 30.4 pg (ref 26.0–34.0)
MCHC: 32.5 g/dL (ref 30.0–36.0)
MCV: 93.5 fL (ref 80.0–100.0)
Platelets: 205 10*3/uL (ref 150–400)
RBC: 4.48 MIL/uL (ref 3.87–5.11)
RDW: 12.5 % (ref 11.5–15.5)
WBC: 7.2 10*3/uL (ref 4.0–10.5)
nRBC: 0 % (ref 0.0–0.2)

## 2019-10-16 LAB — I-STAT BETA HCG BLOOD, ED (MC, WL, AP ONLY): I-stat hCG, quantitative: <5 m[IU]/mL (ref <5)

## 2019-10-16 LAB — SARS CORONAVIRUS 2 BY RT PCR (HOSPITAL ORDER, PERFORMED IN ~~LOC~~ HOSPITAL LAB): SARS Coronavirus 2: NEGATIVE

## 2019-10-16 LAB — LIPASE, BLOOD: Lipase: 23 U/L (ref 11–51)

## 2019-10-16 NOTE — ED Notes (Signed)
Pt stated she is going to go home. Tech told pt if she still feels bad she should wait to see the provider but pt states she is ok, she will just see her PCP tomorrow

## 2019-10-16 NOTE — ED Triage Notes (Signed)
ABD pain x 3 days with positive covid exposure at work and requesting test. Denies sob, cough, n/v, diarrhea, fever, burning with urination. Endorses chills

## 2019-11-26 ENCOUNTER — Other Ambulatory Visit: Payer: Self-pay

## 2019-11-26 ENCOUNTER — Ambulatory Visit (HOSPITAL_COMMUNITY)
Admission: EM | Admit: 2019-11-26 | Discharge: 2019-11-27 | Disposition: A | Discharge status: Home / Self Care | Payer: No Typology Code available for payment source | Attending: Nurse Practitioner | Admitting: Nurse Practitioner

## 2019-11-26 ENCOUNTER — Encounter (HOSPITAL_COMMUNITY): Payer: Self-pay | Admitting: Emergency Medicine

## 2019-11-26 ENCOUNTER — Ambulatory Visit (HOSPITAL_COMMUNITY)
Admission: RE | Admit: 2019-11-26 | Discharge: 2019-11-26 | Disposition: A | Discharge status: Home / Self Care | Payer: No Typology Code available for payment source | Source: Home / Self Care | Attending: Psychiatry | Admitting: Psychiatry

## 2019-11-26 DIAGNOSIS — F3112 Bipolar disorder, current episode manic without psychotic features, moderate: Secondary | ICD-10-CM

## 2019-11-26 DIAGNOSIS — F4310 Post-traumatic stress disorder, unspecified: Secondary | ICD-10-CM | POA: Insufficient documentation

## 2019-11-26 DIAGNOSIS — F32A Depression, unspecified: Secondary | ICD-10-CM | POA: Insufficient documentation

## 2019-11-26 DIAGNOSIS — F411 Generalized anxiety disorder: Secondary | ICD-10-CM | POA: Insufficient documentation

## 2019-11-26 DIAGNOSIS — F1910 Other psychoactive substance abuse, uncomplicated: Secondary | ICD-10-CM | POA: Insufficient documentation

## 2019-11-26 DIAGNOSIS — F1590 Other stimulant use, unspecified, uncomplicated: Secondary | ICD-10-CM | POA: Insufficient documentation

## 2019-11-26 DIAGNOSIS — Z79899 Other long term (current) drug therapy: Secondary | ICD-10-CM | POA: Insufficient documentation

## 2019-11-26 DIAGNOSIS — R45851 Suicidal ideations: Secondary | ICD-10-CM | POA: Insufficient documentation

## 2019-11-26 DIAGNOSIS — F312 Bipolar disorder, current episode manic severe with psychotic features: Secondary | ICD-10-CM | POA: Insufficient documentation

## 2019-11-26 DIAGNOSIS — F603 Borderline personality disorder: Secondary | ICD-10-CM | POA: Insufficient documentation

## 2019-11-26 DIAGNOSIS — Z9151 Personal history of suicidal behavior: Secondary | ICD-10-CM | POA: Insufficient documentation

## 2019-11-26 DIAGNOSIS — F17210 Nicotine dependence, cigarettes, uncomplicated: Secondary | ICD-10-CM | POA: Insufficient documentation

## 2019-11-26 DIAGNOSIS — F1290 Cannabis use, unspecified, uncomplicated: Secondary | ICD-10-CM | POA: Insufficient documentation

## 2019-11-26 DIAGNOSIS — Z20822 Contact with and (suspected) exposure to covid-19: Secondary | ICD-10-CM | POA: Insufficient documentation

## 2019-11-26 DIAGNOSIS — G4700 Insomnia, unspecified: Secondary | ICD-10-CM | POA: Insufficient documentation

## 2019-11-26 DIAGNOSIS — R450 Nervousness: Secondary | ICD-10-CM | POA: Insufficient documentation

## 2019-11-26 LAB — POCT URINE DRUG SCREEN - MANUAL ENTRY (I-SCREEN)
POC Amphetamine UR: NOT DETECTED
POC Buprenorphine (BUP): NOT DETECTED
POC Cocaine UR: NOT DETECTED
POC Marijuana UR: POSITIVE — AB
POC Methadone UR: NOT DETECTED
POC Methamphetamine UR: NOT DETECTED
POC Morphine: NOT DETECTED
POC Oxazepam (BZO): NOT DETECTED
POC Oxycodone UR: NOT DETECTED
POC Secobarbital (BAR): NOT DETECTED

## 2019-11-26 LAB — POCT URINALYSIS DIP (DEVICE)
Bilirubin Urine: NEGATIVE
Glucose, UA: NEGATIVE mg/dL
Hgb urine dipstick: NEGATIVE
Ketones, ur: NEGATIVE mg/dL
Leukocytes,Ua: NEGATIVE
Nitrite: NEGATIVE
Protein, ur: NEGATIVE mg/dL
Specific Gravity, Urine: 1.025 (ref 1.005–1.030)
Urobilinogen, UA: 0.2 mg/dL (ref 0.0–1.0)
pH: 7.5 (ref 5.0–8.0)

## 2019-11-26 LAB — POC SARS CORONAVIRUS 2 AG -  ED: SARS Coronavirus 2 Ag: NEGATIVE

## 2019-11-26 LAB — POC SARS CORONAVIRUS 2 AG: SARS Coronavirus 2 Ag: NEGATIVE

## 2019-11-26 LAB — POCT PREGNANCY, URINE: Preg Test, Ur: NEGATIVE

## 2019-11-26 MED ORDER — MAGNESIUM HYDROXIDE 400 MG/5ML PO SUSP: 30.0000 mL | Freq: Every day | ORAL | Status: DC | PRN

## 2019-11-26 MED ORDER — ALBUTEROL SULFATE HFA 108 (90 BASE) MCG/ACT IN AERS: 1.0000 | INHALATION_SPRAY | Freq: Four times a day (QID) | RESPIRATORY_TRACT | Status: DC | PRN

## 2019-11-26 MED ORDER — ALUM & MAG HYDROXIDE-SIMETH 200-200-20 MG/5ML PO SUSP: 30.0000 mL | ORAL | Status: DC | PRN

## 2019-11-26 MED ORDER — LAMOTRIGINE 25 MG PO TABS
25.0000 mg | ORAL_TABLET | Freq: Every morning | ORAL | Status: DC
  Administered 2019-11-26: 25 mg via ORAL
  Filled 2019-11-26 (×2): qty 1

## 2019-11-26 MED ORDER — HYDROXYZINE HCL 25 MG PO TABS: 25.0000 mg | ORAL_TABLET | Freq: Three times a day (TID) | ORAL | Status: DC | PRN

## 2019-11-26 MED ORDER — ACETAMINOPHEN 325 MG PO TABS: 650.0000 mg | ORAL_TABLET | Freq: Four times a day (QID) | ORAL | Status: DC | PRN

## 2019-11-26 MED ORDER — PRAZOSIN HCL 1 MG PO CAPS
1.0000 mg | ORAL_CAPSULE | Freq: Every day | ORAL | Status: DC
  Administered 2019-11-26: 1 mg via ORAL
  Filled 2019-11-26: qty 1

## 2019-11-26 MED ORDER — ARIPIPRAZOLE 5 MG PO TABS
5.0000 mg | ORAL_TABLET | Freq: Every day | ORAL | Status: DC
  Administered 2019-11-26 – 2019-11-27 (×2): 5 mg via ORAL
  Filled 2019-11-26 (×2): qty 1

## 2019-11-26 MED ORDER — BUSPIRONE HCL 5 MG PO TABS
10.0000 mg | ORAL_TABLET | Freq: Two times a day (BID) | ORAL | Status: DC
  Administered 2019-11-26 – 2019-11-27 (×2): 10 mg via ORAL
  Filled 2019-11-26 (×2): qty 2

## 2019-11-26 MED ORDER — TRAZODONE HCL 150 MG PO TABS
400.0000 mg | ORAL_TABLET | Freq: Every evening | ORAL | Status: DC | PRN
  Administered 2019-11-26: 400 mg via ORAL
  Filled 2019-11-26: qty 1

## 2019-11-26 NOTE — BH Assessment (Signed)
Assessment Note  Disposition: Denzil Magnuson, NP recommends Overnight Observation bed at the [facility]  Diagnosis: MDD, recurrent, severe with sx of psychosis  Tamura Besecker is a 19 y.o. female who presents voluntarily to [facility] for a walk-in assessment. Pt was accompanied by a peer support counselor from [facility]. Pt recently moved back to [place] after spending months living in [place]. Pt reports she stopped taking her psychiatric medications this past August. Pt is reporting symptoms of depression with suicidal ideation. Pt has a history of multiple suicide attempts "30-40" and inpt psychiatric admissions. Pt reports current suicidal ideation with plans of overdosing, walking into traffic or jumping off a bridge. Pt acknowledges multiple symptoms of Depression, including anhedonia, isolating, feelings of worthlessness & guilt, tearfulness, changes in sleep & appetite, & increased irritability. Pt reports homicidal ideation toward her son, taking him with her. Pt denies having plan, desire or intention to harm him. She states she has been keeping her son in the care of both his grandparents and his father to keep him safe. Pt reports auditory & visual hallucinations- seeing and hearing people- and states this is not a new sx. She states they are not scary to her. Pt states current stressors include relapse on drugs and alcohol & regret from going off of her medications.   Pt lives with her grandparents. Pt has partial insight and judgment. Pt's memory is intact. Legal history includes no charges.  Protective factors against suicide include good family support & no access to firearms.?  Pt reports relapse on alcohol and drugs. She reports drinking up to a fifth of liquor 5-7x weekly.  Use of ocaine (snorting), XTC, mushrooms and THC were also reported by pt. ? MSE: Pt is casually dressed, tearful, oriented x 5 with normal speech and normal motor behavior. Eye contact is good. Pt's mood is  depressed and anxious. Affect is congruent with mood. Thought process is coherent and relevant. There is no indication pt is currently responding to internal stimuli or experiencing delusional thought content. Pt was cooperative throughout assessment.      Past Medical History:  Past Medical History:  Diagnosis Date   Anxiety    Asthma    Headache(784.0)    Hx of suicide attempt    Major depressive disorder    PTSD (post-traumatic stress disorder)     No past surgical history on file.  Family History:  Family History  Problem Relation Age of Onset   Diabetes Maternal Aunt    Diabetes Maternal Grandmother    Cancer Maternal Grandmother    Breast cancer Other     Social History:  reports that she has been smoking cigarettes and cigars. She has been smoking about 0.50 packs per day. She has never used smokeless tobacco. She reports current alcohol use. She reports current drug use. Drugs: Marijuana and MDMA (Ecstacy).  Additional Social History:  Alcohol / Drug Use Pain Medications: denies Prescriptions: states she stopped taking her meds since august Over the Counter: see MAR History of alcohol / drug use?: Yes Longest period of sobriety (when/how long): 1 year Negative Consequences of Use: Financial, Personal relationships, Work / School Substance #1 Name of Substance 1: alcohol- liquor 1 - Amount (size/oz): fifth 1 - Frequency: 5-7 nights a week 1 - Duration: months 1 - Last Use / Amount: last night 10/31 Substance #2 Name of Substance 2: cocaine 2 - Amount (size/oz): UTA 2 - Frequency: about twice monthly Substance #3 Name of Substance 3: thc  3 - Frequency: daily 3 - Last Use / Amount: today  CIWA: CIWA-Ar BP: 111/77 Pulse Rate: 76 COWS:    Allergies:  Allergies  Allergen Reactions   Lactose Intolerance (Gi) Nausea And Vomiting and Other (See Comments)   Tape Other (See Comments)    Slight skin irritation    Home Medications:  No  medications prior to admission.    OB/GYN Status:  No LMP recorded.  General Assessment Data Location of Assessment:  [facility] ASSESSMENT) TTS Assessment: In system Is this a Tele or Face-to-Face Assessment?: Face-to-Face Is this an Initial Assessment or a Re-assessment for this encounter?: Initial Assessment Patient Accompanied by:: Other [facility] peer support) Language Other than English: No Living Arrangements: Other (Comment) What gender do you identify as?: Female Marital status: Single Living Arrangements: Other relatives (grandparents) Can pt return to current living arrangement?: Yes Admission Status: Voluntary Is patient capable of signing voluntary admission?: Yes Referral Source: Self/Family/Friend Insurance type: medicaid     Crisis Care Plan Living Arrangements: Other relatives (grandparents) Name of Psychiatrist: [facility] Name of Therapist: none  Education Status Is patient currently in school?: No Is the patient employed, unemployed or receiving disability?: Unemployed  Risk to self with the past 6 months Suicidal Ideation: Yes-Currently Present Has patient been a risk to self within the past 6 months prior to admission? : No Suicidal Intent: No-Not Currently/Within Last 6 Months Has patient had any suicidal intent within the past 6 months prior to admission? : Yes Is patient at risk for suicide?: Yes Suicidal Plan?: Yes-Currently Present Has patient had any suicidal plan within the past 6 months prior to admission? : Yes Specify Current Suicidal Plan: OD; run into traffic; get kidnapped What has been your use of drugs/alcohol within the last 12 months?: 5-7x weekly Previous Attempts/Gestures: Yes How many times?: 35 (30-40-x) Other Self Harm Risks: current SI, past attempts, substance abuse Triggers for Past Attempts: None known Intentional Self Injurious Behavior: None Family Suicide History: No Recent stressful life event(s): Turmoil  (Comment), Other (Comment) (moved back from [place]) Persecutory voices/beliefs?: Yes Depression: Yes Depression Symptoms: Despondent, Insomnia, Tearfulness, Isolating, Fatigue, Guilt, Loss of interest in usual pleasures, Feeling worthless/self pity, Feeling angry/irritable Substance abuse history and/or treatment for substance abuse?: Yes Suicide prevention information given to non-admitted patients: Yes  Risk to Others within the past 6 months Homicidal Ideation: Yes-Currently Present Does patient have any lifetime risk of violence toward others beyond the six months prior to admission? : Unknown Thoughts of Harm to Others: Yes-Currently Present Comment - Thoughts of Harm to Others: thought to kill self and take son with her Current Homicidal Intent: No Current Homicidal Plan: No-Not Currently/Within Last 6 Months Identified Victim: son History of harm to others?: No Assessment of Violence: None Noted Does patient have access to weapons?: No Criminal Charges Pending?: No Does patient have a court date: No Is patient on probation?: No  Psychosis Hallucinations: Auditory, Visual Delusions: None noted  Mental Status Report Appearance/Hygiene: Unremarkable Eye Contact: Good Motor Activity: Freedom of movement Speech: Logical/coherent Level of Consciousness: Alert, Crying Mood: Anxious, Pleasant, Despair Affect: Anxious, Sad Anxiety Level: Minimal Thought Processes: Coherent, Relevant Judgement: Partial Orientation: Appropriate for developmental age Obsessive Compulsive Thoughts/Behaviors: None  Cognitive Functioning Concentration: Fair Memory: Recent Intact, Remote Intact Insight: Fair Impulse Control: Poor Appetite:  (up and down) Have you had any weight changes? : Loss (Pt "feels" she has lost wt) Sleep: Decreased Total Hours of Sleep:  (up and down) Vegetative Symptoms: Staying  in bed  ADLScreening [facility] Assessment Services) Patient's cognitive ability adequate to  safely complete daily activities?: Yes Patient able to express need for assistance with ADLs?: Yes Independently performs ADLs?: Yes (appropriate for developmental age)  Prior Inpatient Therapy Prior Inpatient Therapy: Yes Prior Therapy Dates: multiple Prior Therapy Facilty/Provider(s): multiple Reason for Treatment: SI  Prior Outpatient Therapy Prior Outpatient Therapy: Yes Prior Therapy Dates:  (not since 08/2019) Prior Therapy Facilty/Provider([facility]): [facility] Reason for Treatment: Depression, SI Does patient have an ACCT team?: No Does patient have Intensive In-House Services?  : No Does patient have [facility] services? : No Does patient have P4CC services?: No  ADL Screening (condition at time of admission) Patient's cognitive ability adequate to safely complete daily activities?: Yes Is the patient deaf or have difficulty hearing?: No Does the patient have difficulty seeing, even when wearing glasses/contacts?: No Does the patient have difficulty concentrating, remembering, or making decisions?: No Patient able to express need for assistance with ADLs?: Yes Does the patient have difficulty dressing or bathing?: No Independently performs ADLs?: Yes (appropriate for developmental age) Does the patient have difficulty walking or climbing stairs?: No Weakness of Legs: None Weakness of Arms/Hands: None  Home Assistive Devices/Equipment Home Assistive Devices/Equipment: None  Therapy Consults (therapy consults require a physician order) PT Evaluation Needed: No OT Evalulation Needed: No SLP Evaluation Needed: No Abuse/Neglect Assessment (Assessment to be complete while patient is alone) Abuse/Neglect Assessment Can Be Completed: Yes Exploitation of patient/patient's resources: Denies Self-Neglect: Denies Values / Beliefs Cultural Requests During Hospitalization: None Spiritual Requests During Hospitalization: None Consults Spiritual Care Consult  Needed: No Transition of Care Team Consult Needed: No Advance Directives (For Healthcare) Does Patient Have a Medical Advance Directive?: No Would patient like information on creating a medical advance directive?: No - Patient declined           Disposition: Denzil Magnuson, NP recommends Overnight Observation bed at the [facility] Disposition Initial Assessment Completed for this Encounter: Yes Patient referred to:  [facility] OBS)  On Site Evaluation by:   Reviewed with Physician:    Clearnce Sorrel 11/26/2019 5:46 PM

## 2019-11-26 NOTE — ED Notes (Signed)
Items in locker #3 

## 2019-11-26 NOTE — ED Triage Notes (Signed)
Presents with suicidal ideation, no plan at present, AVH noted.  Denies HI.

## 2019-11-26 NOTE — ED Provider Notes (Signed)
Behavioral Health Admission H&P [facility])  Date: 11/27/19 Patient Name: [name] MRN: [id number] Chief Complaint:  Chief Complaint  Patient presents with  . Suicidal      Diagnoses:  Final diagnoses:  Bipolar affective disorder, currently manic, moderate (HCC)    HPI: [name] is an 19 y.o. female.who presented to [facility] as a walk-in accompanied by her peer support sponsor from the [facility]. She endorsed suicidal ideations and depressive symptoms triggered by not being on her medication. She was transferred to [facility] for continuous assessment.  Patient has a history of PTSD, depression, generalized anxiety disorder, and borderline personality disorder. She has been admitted to [facility] numerous times (at least 3 as an adult and 6 or 7 as a child here at [facility]). Her complaints are often similar. She reported that she stopped taking her medications August of this year with no specific reason as to why. During the evaluation she stated," I just want to be normal but I cant understand why I always stop taking my medications." She stated she could not recall what medication she was taking. When asked if she had a suicidal plan she replied," I always think about overdosing but lately I have been creative and thought about jumping off a bridge."  Reported intrusive thoughts described as," having someone kidnap me so my family wont be mad if I did kill myself." She reported having at least," 30" prior suicide attempt with last attempt in September. Stated at that time, she overdose on Trazodone, went to the ED, but told the ED provider that she did not overdose and just wanted to sleep. She endorsed auditory hallucination when asked but described voices as," I see people and talk to them." As she spoke about the voices her peer support sponsor  stated," this is the first time I have heard about the voices." Patient then stated," I don't talk about them a lot because the voices are not bad. It may be  something I created."  When asked about homicidal thoughts she replied," I have thoughts of wanting to hurt me and my son. I cant understand why I have the thoughts because I don't want to hurt him." She added that her son is in the care of multiple family members. She had no plan or intent to harm her son. She denied a history of NSSIB. Reported a significant  history of substance abuse to include," mushroom, ectasy, alcohol, mariajuana, and cocaine. Per sponsor, the main substance used is cocaine. Per patient, the last time she smoked mariajuana was today, drank alcohol (half a bottle of bacardi) the day before yesterday.ectasy use was 2-3 days ago, and she reported she could not recall the last time she snorted cocaine. She endorsed both depression and anxiety. When asked about her triggers she replied," everything. No car, no job, I cant manage my money."  She denied access to firearms. Reported she was receiving outpatient psychiatric services (medication management) through [facility]. As per peer support sponsor. Patient has just restarted services through the [facility] where she will soon start therapy.  On evaluation patient is alert and oriented x 4, pleasant, and cooperative. Speech is clear and coherent, slightly pressured and tangential. Mood is depressed and affect is congruent with mood. Thought content is tangential. She endorsed auditory hallucination when asked but described voices as," I see people and talk to them." States that AVH comes and goes.  No indication that patient is responding to internal stimuli. No evidence  of delusional thought content. Reports suicidal ideations. When asked about a suicide plan, she states "I have thought of multiple ways I could do it."  When asked about homicidal thoughts she states," I have thoughts of wanting to hurt me and my son. I cant understand why I have the thoughts because I don't want to hurt him."   PHQ 2-9:    Integrated  Behavioral Health from 10/31/2017 in [facility] Integrated Behavioral Health from 08/23/2017 in [facility]  PHQ-9 Total Score 16 15        Admission (Discharged) [facility] Ad[facility] (Discharged) from OP [facility] Admission (Discharged) from OP [facility]  C-SSRS RISK CATEGORY High Risk High Risk High Risk       Total Time spent with patient: 20 minutes  Musculoskeletal  Strength & Muscle Tone: within normal limits Gait & Station: normal Patient leans: N/A  Psychiatric Specialty Exam  Presentation General Appearance: Appropriate for Environment;Fairly Groomed  Eye Contact:Fair  Speech:Pressured  Speech Volume:Normal  Handedness:Right   Mood and Affect  Mood:Anxious;Depressed  Affect:Congruent   Thought Process  Thought Processes:Coherent  Descriptions of Associations:Tangential  Orientation:Full (Time, Place and Person)  Thought Content:Tangential  Hallucinations:Hallucinations: None  Ideas of Reference:None  Suicidal Thoughts:Suicidal Thoughts: Yes, Passive SI Passive Intent and/or Plan: Without Intent;Without Plan  Homicidal Thoughts:Homicidal Thoughts: No   Sensorium  Memory:Immediate Fair;Recent Fair;Remote Fair  Judgment:Impaired  Insight:Lacking   Executive Functions  Concentration:Poor  Attention Span:Poor  Recall:Fair  Fund of Knowledge:Good  Language:Good   Psychomotor Activity  Psychomotor Activity:Psychomotor Activity: Normal   Assets  Assets:Communication Skills;Desire for Improvement;Physical Health   Sleep  Sleep:Sleep: Fair   Physical Exam Constitutional:      General: She is not in acute distress.    Appearance: She is not  ill-appearing, toxic-appearing or diaphoretic.  HENT:     Head: Normocephalic.     Right Ear: External ear normal.     Left Ear: External ear normal.  Eyes:     Conjunctiva/sclera: Conjunctivae normal.     Pupils: Pupils are equal, round, and reactive to light.  Cardiovascular:     Rate and Rhythm: Normal rate.  Pulmonary:     Effort: Pulmonary effort is normal. No respiratory distress.  Musculoskeletal:        General: Normal range of motion.  Skin:    General: Skin is warm and dry.  Neurological:     Mental Status: She is alert and oriented to person, place, and time.  Psychiatric:        Mood and Affect: Mood is anxious and depressed.        Thought Content: Thought content is not paranoid or delusional. Thought content includes suicidal ideation. Thought content does not include homicidal ideation. Thought content includes suicidal plan.    Review of Systems  Constitutional: Negative for chills, diaphoresis, fever, malaise/fatigue and weight loss.  HENT: Negative for congestion.   Respiratory: Negative for cough and shortness of breath.   Cardiovascular: Negative for chest pain and palpitations.  Gastrointestinal: Negative for diarrhea, nausea and vomiting.  Neurological: Negative for dizziness and seizures.  Psychiatric/Behavioral: Positive for depression and suicidal ideas. Negative for hallucinations. The patient is nervous/anxious and has insomnia.   All other systems reviewed and  are negative.   Blood pressure (!) 148/94, pulse 69, temperature 99.1 F (37.3 C), resp. rate 18, SpO2 99 %. There is no height or weight on file to calculate BMI.  Past Psychiatric History: Bipolar Disorder, Polysubstance abuse  Is the patient at risk to self? Yes  Has the patient been a risk to self in the past 6 months? Yes .    Has the patient been a risk to self within the distant past? Yes   Is the patient a risk to others? No   Has the patient been a risk to others in the past 6  months? No   Has the patient been a risk to others within the distant past? No   Past Medical History:  Past Medical History:  Diagnosis Date  . Anxiety   . Asthma   . Headache(784.0)   . Hx of suicide attempt   . Major depressive disorder   . PTSD (post-traumatic stress disorder)    History reviewed. No pertinent surgical history.  Family History:  Family History  Problem Relation Age of Onset  . Diabetes Maternal Aunt   . Diabetes Maternal Grandmother   . Cancer Maternal Grandmother   . Breast cancer Other     Social History:  Social History   Socioeconomic History  . Marital status: Single    Spouse name: Not on file  . Number of children: Not on file  . Years of education: Not on file  . Highest education level: Not on file  Occupational History  . Not on file  Tobacco Use  . Smoking status: Current Every Day Smoker    Packs/day: 0.50    Types: Cigarettes, Cigars  . Smokeless tobacco: Never Used  . Tobacco comment: black and mild  Vaping Use  . Vaping Use: Every day  Substance and Sexual Activity  . Alcohol use: Yes    Comment: socially  . Drug use: Yes    Types: Marijuana, MDMA (Ecstacy)  . Sexual activity: Yes    Partners: Male    Birth control/protection: None, Injection  Other Topics Concern  . Not on file  Social History Narrative  . Not on file   Social Determinants of Health   Financial Resource Strain:   . Difficulty of Paying Living Expenses: Not on file  Food Insecurity:   . Worried About Programme researcher, broadcasting/film/video in the Last Year: Not on file  . Ran Out of Food in the Last Year: Not on file  Transportation Needs:   . Lack of Transportation (Medical): Not on file  . Lack of Transportation (Non-Medical): Not on file  Physical Activity:   . Days of Exercise per Week: Not on file  . Minutes of Exercise per Session: Not on file  Stress:   . Feeling of Stress : Not on file  Social Connections:   . Frequency of Communication with Friends and  Family: Not on file  . Frequency of Social Gatherings with Friends and Family: Not on file  . Attends Religious Services: Not on file  . Active Member of Clubs or Organizations: Not on file  . Attends Banker Meetings: Not on file  . Marital Status: Not on file  Intimate Partner Violence:   . Fear of Current or Ex-Partner: Not on file  . Emotionally Abused: Not on file  . Physically Abused: Not on file  . Sexually Abused: Not on file    SDOH:  SDOH Screenings   Alcohol Screen: Low  Risk   . Last Alcohol Screening Score (AUDIT): 2  Depression (PHQ2-9):   . PHQ-2 Score: Not on file  Financial Resource Strain:   . Difficulty of Paying Living Expenses: Not on file  Food Insecurity:   . Worried About Programme researcher, broadcasting/film/video in the Last Year: Not on file  . Ran Out of Food in the Last Year: Not on file  Housing:   . Last Housing Risk Score: Not on file  Physical Activity:   . Days of Exercise per Week: Not on file  . Minutes of Exercise per Session: Not on file  Social Connections:   . Frequency of Communication with Friends and Family: Not on file  . Frequency of Social Gatherings with Friends and Family: Not on file  . Attends Religious Services: Not on file  . Active Member of Clubs or Organizations: Not on file  . Attends Banker Meetings: Not on file  . Marital Status: Not on file  Stress:   . Feeling of Stress : Not on file  Tobacco Use: High Risk  . Smoking Tobacco Use: Current Every Day Smoker  . Smokeless Tobacco Use: Never Used  Transportation Needs:   . Freight forwarder (Medical): Not on file  . Lack of Transportation (Non-Medical): Not on file    Last Labs:  Admission on 11/26/2019  Component Date Value Ref Range Status  . SARS Coronavirus 2 by RT PCR 11/26/2019 NEGATIVE  NEGATIVE Final   Comment: (NOTE) SARS-CoV-2 target nucleic acids are NOT DETECTED.  The SARS-CoV-2 RNA is generally detectable in upper respiratoy specimens  during the acute phase of infection. The lowest concentration of SARS-CoV-2 viral copies this assay can detect is 131 copies/mL. A negative result does not preclude SARS-Cov-2 infection and should not be used as the sole basis for treatment or other patient management decisions. A negative result may occur with  improper specimen collection/handling, submission of specimen other than nasopharyngeal swab, presence of viral mutation(s) within the areas targeted by this assay, and inadequate number of viral copies (<131 copies/mL). A negative result must be combined with clinical observations, patient history, and epidemiological information. The expected result is Negative.  Fact Sheet for Patients:  https://www.Meding.com/  Fact Sheet for Healthcare Providers:  https://www.young.biz/  This test is no                          t yet approved or cleared by the [place] FDA and  has been authorized for detection and/or diagnosis of SARS-CoV-2 by FDA under an Emergency Use Authorization (EUA). This EUA will remain  in effect (meaning this test can be used) for the duration of the COVID-19 declaration under Section 564(b)(1) of the Act, 21 U.S.C. section 360bbb-3(b)(1), unless the authorization is terminated or revoked sooner.    . Influenza A by PCR 11/26/2019 NEGATIVE  NEGATIVE Final  . Influenza B by PCR 11/26/2019 NEGATIVE  NEGATIVE Final   Comment: (NOTE) The Xpert Xpress SARS-CoV-2/FLU/RSV assay is intended as an aid in  the diagnosis of influenza from Nasopharyngeal swab specimens and  should not be used as a sole basis for treatment. Nasal washings and  aspirates are unacceptable for Xpert Xpress SARS-CoV-2/FLU/RSV  testing.  Fact Sheet for Patients: https://www.Jakes.com/  Fact Sheet for Healthcare Providers: https://www.young.biz/  This test is not yet approved or cleared by the [place]  FDA and  has been authorized for detection and/or diagnosis of SARS-CoV-2 by  FDA under an Emergency Use Authorization (EUA). This EUA will remain  in effect (meaning this test can be used) for the duration of the  Covid-19 declaration under Section 564(b)(1) of the Act, 21  U.S.C. section 360bbb-3(b)(1), unless the authorization is  terminated or revoked. Performed at [facility], [address] [place], [place] [address]   . SARS Coronavirus 2 Ag 11/26/2019 Negative  Negative Preliminary  . WBC 11/26/2019 7.1  4.0 - 10.5 K/uL Final  . RBC 11/26/2019 4.42  3.87 - 5.11 MIL/uL Final  . Hemoglobin 11/26/2019 13.5  12.0 - 15.0 g/dL Final  . HCT 73/42/8768 40.7  36 - 46 % Final  . MCV 11/26/2019 92.1  80.0 - 100.0 fL Final  . MCH 11/26/2019 30.5  26.0 - 34.0 pg Final  . MCHC 11/26/2019 33.2  30.0 - 36.0 g/dL Final  . RDW 11/57/2620 12.5  11.5 - 15.5 % Final  . Platelets 11/26/2019 218  150 - 400 K/uL Final  . nRBC 11/26/2019 0.0  0.0 - 0.2 % Final  . Neutrophils Relative % 11/26/2019 66  % Final  . Neutro Abs 11/26/2019 4.7  1.7 - 7.7 K/uL Final  . Lymphocytes Relative 11/26/2019 27  % Final  . Lymphs Abs 11/26/2019 1.9  0.7 - 4.0 K/uL Final  . Monocytes Relative 11/26/2019 6  % Final  . Monocytes Absolute 11/26/2019 0.4  0.1 - 1.0 K/uL Final  . Eosinophils Relative 11/26/2019 1  % Final  . Eosinophils Absolute 11/26/2019 0.1  0.0 - 0.5 K/uL Final  . Basophils Relative 11/26/2019 0  % Final  . Basophils Absolute 11/26/2019 0.0  0.0 - 0.1 K/uL Final  . Immature Granulocytes 11/26/2019 0  % Final  . Abs Immature Granulocytes 11/26/2019 0.02  0.00 - 0.07 K/uL Final   Performed at [facility], [address], [place], [place] [address]  . Sodium 11/26/2019 138  135 - 145 mmol/L Final  . Potassium 11/26/2019 3.4* 3.5 - 5.1 mmol/L Final  . Chloride 11/26/2019 102  98 - 111 mmol/L Final  . CO2 11/26/2019 25  22 - 32 mmol/L Final  . Glucose, Bld 11/26/2019 98  70 - 99 mg/dL  Final   Glucose reference range applies only to samples taken after fasting for at least 8 hours.  . BUN 11/26/2019 6  6 - 20 mg/dL Final  . Creatinine, Ser 11/26/2019 0.66  0.44 - 1.00 mg/dL Final  . Calcium 41/63/8453 8.7* 8.9 - 10.3 mg/dL Final  . Total Protein 11/26/2019 6.8  6.5 - 8.1 g/dL Final  . Albumin 64/68/0321 3.9  3.5 - 5.0 g/dL Final  . AST 22/48/2500 23  15 - 41 U/L Final  . ALT 11/26/2019 22  0 - 44 U/L Final  . Alkaline Phosphatase 11/26/2019 58  38 - 126 U/L Final  . Total Bilirubin 11/26/2019 0.6  0.3 - 1.2 mg/dL Final  . GFR, Estimated 11/26/2019 >60  >60 mL/min Final   Comment: (NOTE) Calculated using the CKD-EPI Creatinine Equation (2021)   . Anion gap 11/26/2019 11  5 - 15 Final   Performed at [facility], [address], [place], [place] [address]  . POC Amphetamine UR 11/26/2019 None Detected  None Detected Final  . POC Secobarbital (BAR) 11/26/2019 None Detected  None Detected Final  . POC Buprenorphine (BUP) 11/26/2019 None Detected  None Detected Final  . POC Oxazepam (BZO) 11/26/2019 None Detected  None Detected Final  . POC Cocaine  UR 11/26/2019 None Detected  None Detected Final  . POC Methamphetamine UR 11/26/2019 None Detected  None Detected Final  . POC Morphine 11/26/2019 None Detected  None Detected Final  . POC Oxycodone UR 11/26/2019 None Detected  None Detected Final  . POC Methadone UR 11/26/2019 None Detected  None Detected Final  . POC Marijuana UR 11/26/2019 Positive* None Detected Final  . Glucose, UA 11/26/2019 NEGATIVE  NEGATIVE mg/dL Final  . Bilirubin Urine 11/26/2019 NEGATIVE  NEGATIVE Final  . Ketones, ur 11/26/2019 NEGATIVE  NEGATIVE mg/dL Final  . Specific Gravity, Urine 11/26/2019 1.025  1.005 - 1.030 Final  . Hgb urine dipstick 11/26/2019 NEGATIVE  NEGATIVE Final  . pH 11/26/2019 7.5  5.0 - 8.0 Final  . Protein, ur 11/26/2019 NEGATIVE  NEGATIVE mg/dL Final  . Urobilinogen, UA 11/26/2019 0.2  0.0 - 1.0 mg/dL Final  .  Nitrite 81/19/1478 NEGATIVE  NEGATIVE Final  . Glori Luis 11/26/2019 NEGATIVE  NEGATIVE Final   Biochemical Testing Only. Please order routine urinalysis from main lab if confirmatory testing is needed.  . Preg Test, Ur 11/26/2019 NEGATIVE  NEGATIVE Final   Comment:        THE SENSITIVITY OF THIS METHODOLOGY IS >24 mIU/mL   . SARS Coronavirus 2 Ag 11/26/2019 NEGATIVE  NEGATIVE Final   Comment: (NOTE) SARS-CoV-2 antigen NOT DETECTED.   Negative results are presumptive.  Negative results do not preclude SARS-CoV-2 infection and should not be used as the sole basis for treatment or other patient management decisions, including infection  control decisions, particularly in the presence of clinical signs and  symptoms consistent with COVID-19, or in those who have been in contact with the virus.  Negative results must be combined with clinical observations, patient history, and epidemiological information. The expected result is Negative.  Fact Sheet for Patients: https://sanders-williams.net/  Fact Sheet for Healthcare Providers: https://martinez.com/   This test is not yet approved or cleared by the [place] FDA and  has been authorized for detection and/or diagnosis of SARS-CoV-2 by FDA under an Emergency Use Authorization (EUA).  This EUA will remain in effect (meaning this test can be used) for the duration of  the C                          OVID-19 declaration under Section 564(b)(1) of the Act, 21 U.S.C. section 360bbb-3(b)(1), unless the authorization is terminated or revoked sooner.    Admission on 10/16/2019, Discharged on 10/16/2019  Component Date Value Ref Range Status  . Lipase 10/16/2019 23  11 - 51 U/L Final   Performed at [facility], [address] [place], [place] [address]  . Sodium 10/16/2019 140  135 - 145 mmol/L Final  . Potassium 10/16/2019 4.0  3.5 - 5.1 mmol/L Final  . Chloride 10/16/2019 106  98 - 111  mmol/L Final  . CO2 10/16/2019 27  22 - 32 mmol/L Final  . Glucose, Bld 10/16/2019 85  70 - 99 mg/dL Final   Glucose reference range applies only to samples taken after fasting for at least 8 hours.  . BUN 10/16/2019 7  6 - 20 mg/dL Final  . Creatinine, Ser 10/16/2019 0.83  0.44 - 1.00 mg/dL Final  . Calcium 13/08/6576 9.2  8.9 - 10.3 mg/dL Final  . Total Protein 10/16/2019 6.9  6.5 - 8.1 g/dL Final  . Albumin 46/96/2952 3.9  3.5 - 5.0 g/dL Final  . AST 84/13/2440 21  15 -  41 U/L Final  . ALT 10/16/2019 25  0 - 44 U/L Final  . Alkaline Phosphatase 10/16/2019 59  38 - 126 U/L Final  . Total Bilirubin 10/16/2019 0.5  0.3 - 1.2 mg/dL Final  . GFR calc non Af Amer 10/16/2019 >60  >60 mL/min Final  . GFR calc Af Amer 10/16/2019 >60  >60 mL/min Final  . Anion gap 10/16/2019 7  5 - 15 Final   Performed at [facility], [address], [place], [place] [address]  . WBC 10/16/2019 7.2  4.0 - 10.5 K/uL Final  . RBC 10/16/2019 4.48  3.87 - 5.11 MIL/uL Final  . Hemoglobin 10/16/2019 13.6  12.0 - 15.0 g/dL Final  . HCT 46/96/2952 41.9  36 - 46 % Final  . MCV 10/16/2019 93.5  80.0 - 100.0 fL Final  . MCH 10/16/2019 30.4  26.0 - 34.0 pg Final  . MCHC 10/16/2019 32.5  30.0 - 36.0 g/dL Final  . RDW 84/13/2440 12.5  11.5 - 15.5 % Final  . Platelets 10/16/2019 205  150 - 400 K/uL Final  . nRBC 10/16/2019 0.0  0.0 - 0.2 % Final   Performed at [facility], [address] [place], [place] [address]  . I-stat hCG, quantitative 10/16/2019 <5.0  <5 mIU/mL Final  . Comment 3 10/16/2019          Final   Comment:   GEST. AGE      CONC.  (mIU/mL)   <=1 WEEK        5 - 50     2 WEEKS       50 - 500     3 WEEKS       100 - 10,000     4 WEEKS     1,000 - 30,000        FEMALE AND NON-PREGNANT FEMALE:     LESS THAN 5 mIU/mL   . SARS Coronavirus 2 10/16/2019 NEGATIVE  NEGATIVE Final   Comment: (NOTE) SARS-CoV-2 target nucleic acids are NOT DETECTED.  The SARS-CoV-2 RNA is generally detectable in  upper and lower respiratory specimens during the acute phase of infection. The lowest concentration of SARS-CoV-2 viral copies this assay can detect is 250 copies / mL. A negative result does not preclude SARS-CoV-2 infection and should not be used as the sole basis for treatment or other patient management decisions.  A negative result may occur with improper specimen collection / handling, submission of specimen other than nasopharyngeal swab, presence of viral mutation(s) within the areas targeted by this assay, and inadequate number of viral copies (<250 copies / mL). A negative result must be combined with clinical observations, patient history, and epidemiological information.  Fact Sheet for Patients:   BoilerBrush.com.cy  Fact Sheet for Healthcare Providers: https://pope.com/  This test is not yet approved or                           cleared by the [place] FDA and has been authorized for detection and/or diagnosis of SARS-CoV-2 by FDA under an Emergency Use Authorization (EUA).  This EUA will remain in effect (meaning this test can be used) for the duration of the COVID-19 declaration under Section 564(b)(1) of the Act, 21 U.S.C. section 360bbb-3(b)(1), unless the authorization is terminated or revoked sooner.  Performed at [facility], [address] [place], [place] [address]   Admission on 06/26/2019, Discharged on 06/26/2019  Component Date Value Ref Range Status  . SARS Coronavirus 2 06/26/2019 NEGATIVE  NEGATIVE Final   Comment: (NOTE) SARS-CoV-2 target nucleic acids are NOT DETECTED. The SARS-CoV-2 RNA is generally detectable in upper and lower respiratory specimens during the acute phase of infection. Negative results do not preclude SARS-CoV-2 infection, do not rule out co-infections with other pathogens, and should not be used as the sole basis for treatment or other patient management  decisions. Negative results must be combined with clinical observations, patient history, and epidemiological information. The expected result is Negative. Fact Sheet for Patients: HairSlick.no Fact Sheet for Healthcare Providers: quierodirigir.com This test is not yet approved or cleared by the [place] FDA and  has been authorized for detection and/or diagnosis of SARS-CoV-2 by FDA under an Emergency Use Authorization (EUA). This EUA will remain  in effect (meaning this test can be used) for the duration of the COVID-19 declaration under Section 56                          4(b)(1) of the Act, 21 U.S.C. section 360bbb-3(b)(1), unless the authorization is terminated or revoked sooner. Performed at [facility], [address] [place], [place] [address]   Office Visit on 06/07/2019  Component Date Value Ref Range Status  . Preg Test, Ur 06/07/2019 Negative  Negative Final  . MICRO NUMBER: 06/07/2019 [id number]   Final  . SPECIMEN QUALITY: 06/07/2019 Adequate   Final  . SOURCE: 06/07/2019 NOT GIVEN   Final  . STATUS: 06/07/2019 FINAL   Final  . Trichomonas vaginosis 06/07/2019 Not Detected   Final  . Gardnerella vaginalis 06/07/2019 Detected. Increased levels of G. vaginalis may not be significant in the absence of signs and symptoms of bacterial vaginosis.*  Final  . Candida species 06/07/2019 Not Detected   Final  . HIV 1&2 Ab, 4th Generation 06/07/2019 NON-REACTIVE  NON-REACTI Final   Comment: HIV-1 antigen and HIV-1/HIV-2 antibodies were not detected. There is no laboratory evidence of HIV infection. Marland Kitchen PLEASE NOTE: This information has been disclosed to you from records whose confidentiality may be protected by state law.  If your state requires such protection, then the state law prohibits you from making any further disclosure of the information without the specific written consent of the person to whom it  pertains, or as otherwise permitted by law. A general authorization for the release of medical or other information is NOT sufficient for this purpose. . For additional information please refer to http://education.quest[employer].com/faq/FAQ106 (This link is being provided for informational/ educational purposes only.) . Marland Kitchen The performance of this assay has not been clinically validated in patients less than 21 years old. .   . C. trachomatis RNA, TMA 06/07/2019 NOT DETECTED  NOT DETECT Final  . N. gonorrhoeae RNA, TMA 06/07/2019 NOT DETECTED  NOT DETECT Final   Comment: The analytical performance characteristics of this assay, when used to test SurePath(TM) specimens have been determined by [employer]. The modifications have not been cleared or approved by the FDA. This assay has been validated pursuant to the CLIA regulations and is used for clinical purposes. . For additional information, please refer to https://education.quest[employer].com/faq/FAQ154 (This link is being provided for information/ educational purposes only.) .   Marland Kitchen WBC 06/07/2019 6.6  3.8 - 10.8 Thousand/uL Final  . RBC 06/07/2019 4.68  3.80 - 5.10 Million/uL Final  . Hemoglobin 06/07/2019 14.2  11.7 - 15.5 g/dL Final  . HCT 11/10/5100 43.1  35 - 45 % Final  . MCV 06/07/2019 92.1  80.0 - 100.0 fL Final  . MCH 06/07/2019 30.3  27.0 - 33.0 pg Final  . MCHC 06/07/2019 32.9  32.0 - 36.0 g/dL Final  . RDW 16/10/960405/13/2021 12.6  11.0 - 15.0 % Final  . Platelets 06/07/2019 216  140 - 400 Thousand/uL Final  . MPV 06/07/2019 11.9  7.5 - 12.5 fL Final  . Neutro Abs 06/07/2019 4,435  1,500 - 7,800 cells/uL Final  . Lymphs Abs 06/07/2019 1,630  850 - 3,900 cells/uL Final  . Absolute Monocytes 06/07/2019 436  200 - 950 cells/uL Final  . Eosinophils Absolute 06/07/2019 79  15.0 - 500.0 cells/uL Final  . Basophils Absolute 06/07/2019 20  0.0 - 200.0 cells/uL Final  . Neutrophils Relative % 06/07/2019 67.2  % Final  .  Total Lymphocyte 06/07/2019 24.7  % Final  . Monocytes Relative 06/07/2019 6.6  % Final  . Eosinophils Relative 06/07/2019 1.2  % Final  . Basophils Relative 06/07/2019 0.3  % Final    Allergies: Lactose intolerance (gi) and Tape  PTA Medications: (Not in a hospital admission)   Medical Decision Making  Admission labs ordered  Patient requested STD testing. HIV, Chlamydia/Gonrrhea pending.   Patient reports that she was most recently taking lamotrigine 100 mg BID for mood stability, abilify 5 mg daily, buspirone 10 mg BID, and trazodone 400 mg QHS prn for sleep. States that she has not taken medications in 2-3 months.   Resume lamotrigine at 25 mg daily for mood stability, first dose now Resume Abilify 5 mg daily mood stability, first dose now Resume buspirone 10 mg BID for anxiety Resume prazosin at 1 mg QHS for PTSD Resume trazodone 400 mg QHS prn   Clinical Course as of Nov 27 326  Tue Nov 27, 2019  0310 UA unremarkable  POCT urinalysis dip (device) [JB]  54090311 UDS positive for Marijuana, negative for other substances  POC Marijuana UR(!): Positive [JB]  0311 K+ 3.4, replace with oral potassium 20 mEq x 1 dose  Potassium(!): 3.4 [JB]  0312 CBC unremarkable  CBC with Differential/Platelet [JB]    Clinical Course User Index [JB] Jackelyn PolingBerry, Naliya Gish A, NP    Recommendations  Based on my evaluation the patient does not appear to have an emergency medical condition.   [facility] and [facility]. She will be reevaluated on 11/27/2019. The treatment team will determine disposition at that time.      Jackelyn PolingJason A Leondro Coryell, NP 11/27/19  3:28 AM

## 2019-11-26 NOTE — ED Notes (Signed)
Presents with suicidal ideations, no plan noted, and AVH.  Denies HI.  Pt also reports UTI symptoms and vaginal DC.  Pt reports she recently used mushrooms.  Pt calm & cooperative.  Monitoring for safety.

## 2019-11-26 NOTE — H&P (Signed)
Behavioral Health Medical Screening Exam  [name] is an 19 y.o. female.who presented to [facility] as a walk-in accompanied by her peer support sponsor from the [facility]. She endorses suicidal ideations and depressive symptoms triggered by not being on her medication. Patient history id significant for PTSD, depression, generalized anxiety disorder, and borderline personality disorder. She has been admitted to [facility] numerous times (at least 3 as an adult and 6 or 7 as a child here at [facility]). Her complaints are often similar. She reported that she stopped taking her medications August of this year with no specific reason as to why. During the evaluation she stated," I just want to be normal but I cant understand why I always stop taking my medications." She stated she could not recall what medication she was taking. When asked if she had a suicidal plan she replied," I always think about overdosing but lately I have been creative and thought about jumping off a bridge."  Reported intrusive thoughts described as," having someone kidnap me so my family wont be mad if I did kill myself." She reported having at least," 30" prior suicide attempt with last attempt in September. Stated at that time, she overdose on Trazodone, went to the ED, but told the ED provider that she did not overdose and just wanted to sleep. She endorsed auditory hallucination when asked but described voices as," I see people and talk to them." As she spoke about the voices her peer support sponsor  stated," this is the first time I have heard about the voices." Patient then stated," I donut talk about them a lot because the voices are not bad. It may be something I created."  When asked about homicidal thoughts she replied," I have thoughts of wanting to hurt me and my son. I cant understand why I have the thoughts because I don't want to hurt him." She added that her son is in the care of mu;tiple family members. She had no plan or intent to  harm her son. She denied a history of NSSIB. Reported a significant  history of substance abuse to include," mushroom, ectasy, alcohol, mariajuana, and cocaine. Per sponsor, the main substance used is cocaine. Per patient, the last time she smoked mariajuana was today, drank alcohol (half a bottle of bacardi) the day before yesterday.ectasy use was 2-3 days ago, and she reported she could not recall the last time she snorted cocaine. She endorsed both depression and anxiety. When asked about her triggers she replied," everything. No car, no job, I cant manage my money."  She denied access to firearms. Reported she was receiving outpatient psychiatric services (medication management) through [facility]. As per peer support sponsor. Patient has just restarted services through the [facility] where she will soon start [facility].      Total Time spent with patient: 30 minutes  Psychiatric Specialty Exam: Physical Exam Review of Systems There were no vitals taken for this visit.There is no height or weight on file to calculate BMI. General Appearance: Fairly Groomed Eye Contact:  Good Speech:  Clear and Coherent and Normal Rate Volume:  Normal Mood:  patient endorses depression altghough mood does not appear depressed.  Affect:  Non-Congruent and Labile Thought Process:  Coherent, Linear and Descriptions of Associations: Intact Orientation:  Full (Time, Place, and Person) Thought Content:  Logical Suicidal Thoughts:  Yes.  with intent/plan Homicidal Thoughts:  passive HI but no plan or intent  Memory:  Immediate;   Fair Recent;  Fair Judgement:  Impaired Insight:  Lacking Psychomotor Activity:  Normal Concentration: Concentration: Fair and Attention Span: Fair Recall:  [employer] of Knowledge:Fair Language: Good Akathisia:  Negative Handed:  Right AIMS (if indicated):    Assets:  Communication Skills Desire for Improvement Resilience Sleep:     Musculoskeletal: Strength &  Muscle Tone: within normal limits Gait & Station: normal Patient leans: N/A  There were no vitals taken for this visit.  Recommendations: Based on my evaluation the patient does not appear to have an emergency medical condition.   Patient endorses SI and is unable to contract for safety. She states that she has had plans to overdose or jump off a bridge. She has a long psychiatric history to include substance abuse. Despite patients report of depression, her affect is very incongruent (smiles and laughs throughout the assessment but mood is also labile). She is requesting to restart medication although could not recall the names of most recent medications. [place] she is not able to contract for safety. I have recommended overnight observation. TTS counselor has tried contact the [facility] provider to see if beds are available. If so, patient will be transported to [facility] by safe transport. If not, patient will be transported to the ED for overnight observation and be reassessed by psychiatry in the morning for further plan of care.   Denzil Magnuson, NP 11/26/2019, 4:30 PM

## 2019-11-27 LAB — CBC WITH DIFFERENTIAL/PLATELET
Abs Immature Granulocytes: 0.02 10*3/uL (ref 0.00–0.07)
Basophils Absolute: 0 10*3/uL (ref 0.0–0.1)
Basophils Relative: 0 %
Eosinophils Absolute: 0.1 10*3/uL (ref 0.0–0.5)
Eosinophils Relative: 1 %
HCT: 40.7 % (ref 36.0–46.0)
Hemoglobin: 13.5 g/dL (ref 12.0–15.0)
Immature Granulocytes: 0 %
Lymphocytes Relative: 27 %
Lymphs Abs: 1.9 10*3/uL (ref 0.7–4.0)
MCH: 30.5 pg (ref 26.0–34.0)
MCHC: 33.2 g/dL (ref 30.0–36.0)
MCV: 92.1 fL (ref 80.0–100.0)
Monocytes Absolute: 0.4 10*3/uL (ref 0.1–1.0)
Monocytes Relative: 6 %
Neutro Abs: 4.7 10*3/uL (ref 1.7–7.7)
Neutrophils Relative %: 66 %
Platelets: 218 10*3/uL (ref 150–400)
RBC: 4.42 MIL/uL (ref 3.87–5.11)
RDW: 12.5 % (ref 11.5–15.5)
WBC: 7.1 10*3/uL (ref 4.0–10.5)
nRBC: 0 % (ref 0.0–0.2)

## 2019-11-27 LAB — COMPREHENSIVE METABOLIC PANEL
ALT: 22 U/L (ref 0–44)
AST: 23 U/L (ref 15–41)
Albumin: 3.9 g/dL (ref 3.5–5.0)
Alkaline Phosphatase: 58 U/L (ref 38–126)
Anion gap: 11 (ref 5–15)
BUN: 6 mg/dL (ref 6–20)
CO2: 25 mmol/L (ref 22–32)
Calcium: 8.7 mg/dL — ABNORMAL LOW (ref 8.9–10.3)
Chloride: 102 mmol/L (ref 98–111)
Creatinine, Ser: 0.66 mg/dL (ref 0.44–1.00)
GFR, Estimated: >60 mL/min (ref >60)
Glucose, Bld: 98 mg/dL (ref 70–99)
Potassium: 3.4 mmol/L — ABNORMAL LOW (ref 3.5–5.1)
Sodium: 138 mmol/L (ref 135–145)
Total Bilirubin: 0.6 mg/dL (ref 0.3–1.2)
Total Protein: 6.8 g/dL (ref 6.5–8.1)

## 2019-11-27 LAB — RESPIRATORY PANEL BY RT PCR (FLU A&B, COVID)
Influenza A by PCR: NEGATIVE
Influenza B by PCR: NEGATIVE
SARS Coronavirus 2 by RT PCR: NEGATIVE

## 2019-11-27 LAB — GC/CHLAMYDIA PROBE AMP (~~LOC~~) NOT AT ARMC
Chlamydia: POSITIVE — AB
Comment: NEGATIVE
Comment: NORMAL
Neisseria Gonorrhea: NEGATIVE

## 2019-11-27 LAB — HIV ANTIBODY (ROUTINE TESTING W REFLEX): HIV Screen 4th Generation wRfx: NONREACTIVE

## 2019-11-27 MED ORDER — HYDROXYZINE HCL 25 MG PO TABS: 25.0000 mg | ORAL_TABLET | Freq: Three times a day (TID) | ORAL | 0 refills | Status: DC | PRN

## 2019-11-27 MED ORDER — ARIPIPRAZOLE 5 MG PO TABS: 5.0000 mg | ORAL_TABLET | Freq: Every day | ORAL | 0 refills | Status: DC

## 2019-11-27 MED ORDER — TRAZODONE HCL 100 MG PO TABS
400.0000 mg | ORAL_TABLET | Freq: Every evening | ORAL | 0 refills | Status: DC | PRN
Start: 2019-11-27 — End: 2020-01-24

## 2019-11-27 MED ORDER — POTASSIUM CHLORIDE CRYS ER 20 MEQ PO TBCR
20.0000 meq | EXTENDED_RELEASE_TABLET | Freq: Once | ORAL | Status: AC
  Administered 2019-11-27: 20 meq via ORAL
  Filled 2019-11-27: qty 1

## 2019-11-27 MED ORDER — ONDANSETRON 4 MG PO TBDP
8.0000 mg | ORAL_TABLET | Freq: Three times a day (TID) | ORAL | Status: DC | PRN
  Administered 2019-11-27: 8 mg via ORAL
  Filled 2019-11-27: qty 2

## 2019-11-27 MED ORDER — PRAZOSIN HCL 1 MG PO CAPS: 1.0000 mg | ORAL_CAPSULE | Freq: Every day | ORAL | 0 refills | Status: DC

## 2019-11-27 MED ORDER — LAMOTRIGINE 25 MG PO TABS: 25.0000 mg | ORAL_TABLET | Freq: Every morning | ORAL | 0 refills | Status: DC

## 2019-11-27 MED ORDER — BUSPIRONE HCL 10 MG PO TABS: 10.0000 mg | ORAL_TABLET | Freq: Two times a day (BID) | ORAL | 0 refills | Status: DC

## 2019-11-27 NOTE — ED Notes (Signed)
Patient discharged home. AVS/Follow-Up/Prescription reviewed with pt and written copies given to patient. Patient verbalized understanding. Patient denies SI at this time. All patient belongings returned to her. Patient escorted off unit to meet family who picked her up.

## 2019-11-27 NOTE — ED Notes (Signed)
Patient resting in bed with eyes closed. Respirations even and non labored. No distress noted. Monitoring continues. 

## 2019-11-27 NOTE — ED Notes (Signed)
Pt nauseated and vomiting x 1 episode.  NP Nira Conn notified.  Comfort measures given.  Meds given.

## 2019-11-27 NOTE — Discharge Instructions (Addendum)
Follow up with the [facility] on December 11, 2019 at 10:15 am for an assessment for the psychosocial rehabilitation program.  Walk in hours for therapy at the [facility] is on Monday - Thursday from 9 am to 11 am.   Patient is instructed prior to discharge to: Take all medications as prescribed by his/her mental healthcare provider. Report any adverse effects and or reactions from the medicines to his/her outpatient provider promptly. Patient has been instructed & cautioned: To not engage in alcohol and or illegal drug use while on prescription medicines. In the event of worsening symptoms, patient is instructed to call the crisis hotline, 911 and or go to the nearest ED for appropriate evaluation and treatment of symptoms. To follow-up with his/her primary care provider for your other medical issues, concerns and or health care needs.

## 2019-11-27 NOTE — ED Provider Notes (Signed)
FBC/OBS ASAP Discharge Summary  Date and Time: 11/27/2019 12:31 PM  Name: [name]  MRN:  [id number]   Discharge Diagnoses:  Final diagnoses:  Bipolar affective disorder, currently manic, moderate (HCC)    Subjective: "I have passive suicidal thoughts right now and have the potential to become actively suicidal if I am triggered by loud noises. The loud noises are a trigger and the sounds are amplified. I haven't been on my medications and have been having crying spells and meltdowns if something happens, such as me dropping a pen. I have been off my medications since August, because I moved to [place] and my therapy was discontinued because I moved out of [place]. I was discharged too soon from therapy. I just need to be back on my medications."   Stay Summary: The patient was seen and examined face to face on the unit. She presents with a depressed mood and affect is congruent. Her speech is clear, coherent and pressured. Her thought content is logical with no delusional content or paranoia. She reports chronic passive suicidal thoughts with no plan or intent. She reports that she have a three year old son and that prevents her from committing suicide. She verbally contracts for safety. She reports that she resides with her grandmother and feels safe at home. Per nursing notes, the patient has denied having suicidal ideations during shift assessments. She denies homicidal thoughts and is no longer having thoughts to hurt her son. She reports having chronic auditory and visual hallucinations since she was a child. She reports hearing voices that do not say bad or negative things. She reports seeing people that she know is not real. Although she reports AVH, she does not appear to be responding to internal or external stimuli. She is requesting inpatient treatment for therapy to discuss past childhood trauma or daily therapy. On admission, she reported polysubstance abuse however, she declined  residential or substance abuse treatment.  We discussed treatment options to include the partial hospitalization program at the [facility], or continuing outpatient  services at [facility]. She declined to return back to [facility]. She verbalized interest in attending the PHP at the [facility]  I consulted with Huntley Dec, LCW., for treatment options. The patient was declined for PHP at the [facility] due to requiting a dual dx program per the program's coordinator. The patient was declined at [facility] dual dx program due to the  patient's insurance carrier. The patient requested for this clinician to contact the [facility] about her attending the psychosocial rehabilitation program. This clinician spoke with Demetrio Lapping at the [facility] who scheduled the patient for an assessment appointment on December 11, 2019 for the [facility] program. The patient has been compliant with taking the prescribed medications on the unit and denies any medication side effects.   The patient gave verbal consent for this clinician to speak with her grandmother Keyira Mondesir who she resides with. I contacted Aniya Jolicoeur at [phone] who stated that the patient needed to take her medications and that she is glad that the patient is getting back on her medications. She denies any concerns regarding the patient discharging home today. She reports that there are no guns or other weapons in the house. She was advised to lock up any weapons, or medication containers in the home as safety precautions. Brendolyn Patty verbalized understanding of the stated safety plan. Brendolyn Patty agreed to pick the patient up from the [facility] when she is discharged.   There is no evidence  that the patient requires inpatient psychiatric treatment at this time and can benefit from outpatient resources. Labs reviewed, positive for THC. Vital signs reviewed. Medications reviewed and prescriptions sent electronically to the pharmacy on file.    Total Time spent with  patient: 30 minutes  Past Psychiatric History: MDD, Anxiety, and PTSD. Past Medical History:  Past Medical History:  Diagnosis Date  . Anxiety   . Asthma   . Headache(784.0)   . Hx of suicide attempt   . Major depressive disorder   . PTSD (post-traumatic stress disorder)    History reviewed. No pertinent surgical history. Family History:  Family History  Problem Relation Age of Onset  . Diabetes Maternal Aunt   . Diabetes Maternal Grandmother   . Cancer Maternal Grandmother   . Breast cancer Other    Family Psychiatric History: Unknown Social History:  Social History   Substance and Sexual Activity  Alcohol Use Yes   Comment: socially     Social History   Substance and Sexual Activity  Drug Use Yes  . Types: Marijuana, MDMA (Ecstacy)    Social History   Socioeconomic History  . Marital status: Single    Spouse name: Not on file  . Number of children: Not on file  . Years of education: Not on file  . Highest education level: Not on file  Occupational History  . Not on file  Tobacco Use  . Smoking status: Current Every Day Smoker    Packs/day: 0.50    Types: Cigarettes, Cigars  . Smokeless tobacco: Never Used  . Tobacco comment: black and mild  Vaping Use  . Vaping Use: Every day  Substance and Sexual Activity  . Alcohol use: Yes    Comment: socially  . Drug use: Yes    Types: Marijuana, MDMA (Ecstacy)  . Sexual activity: Yes    Partners: Male    Birth control/protection: None, Injection  Other Topics Concern  . Not on file  Social History Narrative  . Not on file   Social Determinants of Health   Financial Resource Strain:   . Difficulty of Paying Living Expenses: Not on file  Food Insecurity:   . Worried About Programme researcher, broadcasting/film/video in the Last Year: Not on file  . Ran Out of Food in the Last Year: Not on file  Transportation Needs:   . Lack of Transportation (Medical): Not on file  . Lack of Transportation (Non-Medical): Not on file  Physical  Activity:   . Days of Exercise per Week: Not on file  . Minutes of Exercise per Session: Not on file  Stress:   . Feeling of Stress : Not on file  Social Connections:   . Frequency of Communication with Friends and Family: Not on file  . Frequency of Social Gatherings with Friends and Family: Not on file  . Attends Religious Services: Not on file  . Active Member of Clubs or Organizations: Not on file  . Attends Banker Meetings: Not on file  . Marital Status: Not on file   SDOH:  SDOH Screenings   Alcohol Screen: Low Risk   . Last Alcohol Screening Score (AUDIT): 2  Depression (PHQ2-9):   . PHQ-2 Score: Not on file  Financial Resource Strain:   . Difficulty of Paying Living Expenses: Not on file  Food Insecurity:   . Worried About Programme researcher, broadcasting/film/video in the Last Year: Not on file  . Ran Out of Food in the Last Year:  Not on file  Housing:   . Last Housing Risk Score: Not on file  Physical Activity:   . Days of Exercise per Week: Not on file  . Minutes of Exercise per Session: Not on file  Social Connections:   . Frequency of Communication with Friends and Family: Not on file  . Frequency of Social Gatherings with Friends and Family: Not on file  . Attends Religious Services: Not on file  . Active Member of Clubs or Organizations: Not on file  . Attends BankerClub or Organization Meetings: Not on file  . Marital Status: Not on file  Stress:   . Feeling of Stress : Not on file  Tobacco Use: High Risk  . Smoking Tobacco Use: Current Every Day Smoker  . Smokeless Tobacco Use: Never Used  Transportation Needs:   . Freight forwarderLack of Transportation (Medical): Not on file  . Lack of Transportation (Non-Medical): Not on file    Has this patient used any form of tobacco in the last 30 days? (Cigarettes, Smokeless Tobacco, Cigars, and/or Pipes) A prescription for an FDA-approved tobacco cessation medication was offered at discharge and the patient refused  Current Medications:   Current Facility-Administered Medications  Medication Dose Route Frequency Provider Last Rate Last Admin  . acetaminophen (TYLENOL) tablet 650 mg  650 mg Oral Q6H PRN Nira ConnBerry, Jason A, NP      . albuterol (VENTOLIN HFA) 108 (90 Base) MCG/ACT inhaler 1 puff  1 puff Inhalation Q6H PRN Nira ConnBerry, Jason A, NP      . alum & mag hydroxide-simeth (MAALOX/MYLANTA) 200-200-20 MG/5ML suspension 30 mL  30 mL Oral Q4H PRN Nira ConnBerry, Jason A, NP      . ARIPiprazole (ABILIFY) tablet 5 mg  5 mg Oral Daily Nira ConnBerry, Jason A, NP   5 mg at 11/27/19 0941  . busPIRone (BUSPAR) tablet 10 mg  10 mg Oral BID Nira ConnBerry, Jason A, NP   10 mg at 11/27/19 0941  . hydrOXYzine (ATARAX/VISTARIL) tablet 25 mg  25 mg Oral TID PRN Jackelyn PolingBerry, Jason A, NP      . lamoTRIgine (LAMICTAL) tablet 25 mg  25 mg Oral q morning - 10a Nira ConnBerry, Jason A, NP   25 mg at 11/26/19 2240  . magnesium hydroxide (MILK OF MAGNESIA) suspension 30 mL  30 mL Oral Daily PRN Nira ConnBerry, Jason A, NP      . ondansetron (ZOFRAN-ODT) disintegrating tablet 8 mg  8 mg Oral Q8H PRN Nira ConnBerry, Jason A, NP   8 mg at 11/27/19 0336  . prazosin (MINIPRESS) capsule 1 mg  1 mg Oral QHS Nira ConnBerry, Jason A, NP   1 mg at 11/26/19 2239  . traZODone (DESYREL) tablet 400 mg  400 mg Oral QHS PRN Nira ConnBerry, Jason A, NP   400 mg at 11/26/19 2240   Current Outpatient Medications  Medication Sig Dispense Refill  . albuterol (VENTOLIN HFA) 108 (90 Base) MCG/ACT inhaler Inhale 1 puff into the lungs every 6 (six) hours as needed for wheezing or shortness of breath.    . ARIPiprazole (ABILIFY) 5 MG tablet Take 1 tablet (5 mg total) by mouth daily. For mood control 30 tablet 0  . busPIRone (BUSPAR) 10 MG tablet Take 10 mg by mouth 2 (two) times daily as needed for anxiety.    . cetirizine (ZYRTEC) 10 MG tablet Take 1 tablet (10 mg total) by mouth daily. 30 tablet 0  . fluticasone (FLONASE) 50 MCG/ACT nasal spray Place 1 spray into both nostrils daily. 16 g 2  .  lamoTRIgine (LAMICTAL) 100 MG tablet Take 100 mg by mouth  every morning.    . prazosin (MINIPRESS) 5 MG capsule Take 5 mg by mouth at bedtime.    . traZODone (DESYREL) 100 MG tablet Take 4 tablets (400 mg total) by mouth at bedtime as needed for sleep. 30 tablet 0    PTA Medications: (Not in a hospital admission)   Musculoskeletal  Strength & Muscle Tone: within normal limits Gait & Station: normal Patient leans: N/A  Psychiatric Specialty Exam  Presentation  General Appearance: Appropriate for Environment  Eye Contact:Fair  Speech:Clear and Coherent  Speech Volume:Normal  Handedness:Right   Mood and Affect  Mood:Anxious  Affect:Congruent   Thought Process  Thought Processes:Coherent  Descriptions of Associations:Intact  Orientation:Full (Time, Place and Person)  Thought Content:WDL  Hallucinations:Hallucinations: Visual;Auditory Description of Auditory Hallucinations: "I always hear voices, nothing bad or negative." Description of Visual Hallucinations: "I see people that I know is not real since I was a child."  Ideas of Reference:None  Suicidal Thoughts:Suicidal Thoughts: Yes, Passive SI Passive Intent and/or Plan: Without Plan;Without Intent  Homicidal Thoughts:Homicidal Thoughts: No   Sensorium  Memory:Immediate Fair;Recent Fair;Remote Fair  Judgment:Intact  Insight:Fair   Executive Functions  Concentration:Fair  Attention Span:Fair  Recall:Fair  Fund of Knowledge:Fair  Language:Fair   Psychomotor Activity  Psychomotor Activity:Psychomotor Activity: Normal   Assets  Assets:Communication Skills;Desire for Improvement;Physical Health;Leisure Time;Social Support   Sleep  Sleep:Sleep: Fair   Physical Exam  Physical Exam Vitals and nursing note reviewed.  Constitutional:      Appearance: She is well-developed.  HENT:     Head: Normocephalic.  Eyes:     Pupils: Pupils are equal, round, and reactive to light.  Cardiovascular:     Rate and Rhythm: Normal rate.  Pulmonary:      Effort: Pulmonary effort is normal.  Musculoskeletal:        General: Normal range of motion.  Neurological:     Mental Status: She is alert and oriented to person, place, and time.    Review of Systems  Constitutional: Negative.   HENT: Negative.   Eyes: Negative.   Respiratory: Negative.   Cardiovascular: Negative.   Gastrointestinal: Negative.   Genitourinary: Negative.   Musculoskeletal: Negative.   Skin: Negative.   Neurological: Negative.   Endo/Heme/Allergies: Negative.   Psychiatric/Behavioral: Positive for hallucinations and suicidal ideas.       Passive suicidal thoughts with no plan or intent. Verbally contracts for safety.   Blood pressure 116/66, pulse 76, temperature 98 F (36.7 C), temperature source Oral, resp. rate 18, SpO2 100 %. There is no height or weight on file to calculate BMI.  Demographic Factors:  Adolescent or young adult, Low socioeconomic status and Unemployed  Loss Factors: Financial problems/change in socioeconomic status  Historical Factors: Prior suicide attempts and Impulsivity  Risk Reduction Factors:   Responsible for children under 44 years of age, Sense of responsibility to family, Living with another person, especially a relative and Positive social support  Continued Clinical Symptoms:  Previous Psychiatric Diagnoses and Treatments  Cognitive Features That Contribute To Risk:  None    Suicide Risk:  Minimal: No identifiable suicidal ideation.  Patients presenting with no risk factors but with morbid ruminations; may be classified as minimal risk based on the severity of the depressive symptoms  Plan Of Care/Follow-up recommendations:  Patient is instructed prior to discharge to: Take all medications as prescribed by his/her mental healthcare provider. Report any adverse effects and or reactions  from the medicines to his/her outpatient provider promptly. Patient has been instructed & cautioned: To not engage in alcohol and or  illegal drug use while on prescription medicines. In the event of worsening symptoms, patient is instructed to call the crisis hotline, 911 and or go to the nearest ED for appropriate evaluation and treatment of symptoms. To follow-up with his/her primary care provider for your other medical issues, concerns and or health care needs.   Continue medications: Lamictal 25 mg PO daily. Abilify 5 mg PO daily. Minipress 1 mg PO daily. Buspar 10 mg PO BID Trazodone 400 mg PO daily at bedtime.  Vistaril 25 mg PO TID as needed for anxiety.  Disposition: Discharge to home.  Maryfrances Bunnell, FNP 11/27/2019, 12:31 PM

## 2019-11-27 NOTE — ED Notes (Signed)
Pt sleeping at present, no distress noted, monitoring for safety. 

## 2019-11-27 NOTE — ED Notes (Signed)
Patient alert and oriented. Patient voices SI no plan. Patient contracts for safety on unit. Patient given support and encouragement. Monitoring continues.

## 2019-11-28 ENCOUNTER — Ambulatory Visit (HOSPITAL_COMMUNITY)
Admission: EM | Admit: 2019-11-28 | Discharge: 2019-11-28 | Disposition: A | Discharge status: Home / Self Care | Payer: Medicaid Other | Attending: Family Medicine | Admitting: Family Medicine

## 2019-11-28 ENCOUNTER — Other Ambulatory Visit: Payer: Self-pay

## 2019-11-28 ENCOUNTER — Encounter (HOSPITAL_COMMUNITY): Payer: Self-pay

## 2019-11-28 DIAGNOSIS — A749 Chlamydial infection, unspecified: Secondary | ICD-10-CM | POA: Diagnosis present

## 2019-11-28 MED ORDER — AZITHROMYCIN 250 MG PO TABS
1000.0000 mg | ORAL_TABLET | Freq: Once | ORAL | Status: AC
  Administered 2019-11-28: 1000 mg via ORAL

## 2019-11-28 MED ORDER — AZITHROMYCIN 250 MG PO TABS
ORAL_TABLET | ORAL | Status: AC
  Filled 2019-11-28: qty 4

## 2019-11-28 NOTE — Discharge Instructions (Signed)
We have treated you today for chlamydia.  We are testing you additionally for yeast and bacterial vaginosis.  We will notify of you any positive findings or if any changes to treatment are needed. If normal or otherwise without concern to your results, we will not call you. Please log on to your MyChart to review your results if interested in so.   Please withhold from intercourse for the next week. Please use condoms to prevent STD's.

## 2019-11-28 NOTE — ED Triage Notes (Signed)
Pt presents for STD testing & treatment after testing positive at a recent office visit for chlyamydia.

## 2019-11-28 NOTE — ED Provider Notes (Signed)
[facility]    CSN: [id number] Arrival date & time: 11/28/19  1524      History   Chief Complaint Chief Complaint  Patient presents with  . STD Treatment & Testing    HPI [name] is a 19 y.o. female.   [name] presents with complaints of vaginal discharge. She noted via her MyChart that she tested positive for chlamydia while with behavioral health on 11/1, but had not been notified of this. She is concerned about BV and yeast as well due to odor with vaginal discharge. HIV was tested and was negative. Her boyfriend is here for treatment and testing as well. She states she has had chlamydia in the past.    ROS per HPI, negative if not otherwise mentioned.      Past Medical History:  Diagnosis Date  . Anxiety   . Asthma   . Headache(784.0)   . Hx of suicide attempt   . Major depressive disorder   . PTSD (post-traumatic stress disorder)     Patient Active Problem List   Diagnosis Date Noted  . MDD (major depressive disorder), recurrent episode, severe (HCC) 12/27/2018  . Severe episode of recurrent major depressive disorder, without psychotic features (HCC) 10/10/2017  . Substance use disorder 06/08/2017  . Elevated blood pressure reading 04/13/2017  . 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 Hx of suicide attempt 03/19/2016  . PTSD (post-traumatic stress disorder) 03/19/2016  . Generalized anxiety disorder   . Suicidal ideation     History reviewed. No pertinent surgical history.  OB History    Gravida  1   Para  1   Term  1   Preterm  0   AB  0   Living  1     SAB  0   TAB  0   Ectopic  0   Multiple  0   Live Births  1            Home Medications    Prior to Admission medications   Medication Sig Start Date End Date Taking? Authorizing Provider  albuterol (VENTOLIN HFA) 108 (90 Base) MCG/ACT inhaler Inhale 1 puff into the lungs every 6 (six) hours as needed for wheezing or shortness of breath. 05/11/19   [name],  FNP  ARIPiprazole (ABILIFY) 5 MG tablet Take 1 tablet (5 mg total) by mouth daily. 11/28/19   Money, Gerlene Burdock, FNP  busPIRone (BUSPAR) 10 MG tablet Take 1 tablet (10 mg total) by mouth 2 (two) times daily. 11/27/19   Money, Gerlene Burdock, FNP  cetirizine (ZYRTEC) 10 MG tablet Take 1 tablet (10 mg total) by mouth daily. 06/26/19   Bast, Gloris Manchester A, NP  fluticasone (FLONASE) 50 MCG/ACT nasal spray Place 1 spray into both nostrils daily. 06/26/19   Dahlia Byes A, NP  hydrOXYzine (ATARAX/VISTARIL) 25 MG tablet Take 1 tablet (25 mg total) by mouth 3 (three) times daily as needed for anxiety. 11/27/19   Money, Gerlene Burdock, FNP  lamoTRIgine (LAMICTAL) 25 MG tablet Take 1 tablet (25 mg total) by mouth every morning. 11/28/19   Money, Gerlene Burdock, FNP  prazosin (MINIPRESS) 1 MG capsule Take 1 capsule (1 mg total) by mouth at bedtime. 11/27/19   Money, Gerlene Burdock, FNP  traZODone (DESYREL) 100 MG tablet Take 4 tablets (400 mg total) by mouth at bedtime as needed for sleep. 11/27/19   Money, Gerlene Burdock, FNP    Family History Family History  Problem Relation Age of Onset  .  Diabetes Maternal Aunt   . Diabetes Maternal Grandmother   . Cancer Maternal Grandmother   . Breast cancer Other     Social History Social History   Tobacco Use  . Smoking status: Current Every Day Smoker    Packs/day: 0.50    Types: Cigarettes, Cigars  . Smokeless tobacco: Never Used  . Tobacco comment: black and mild  Vaping Use  . Vaping Use: Every day  Substance Use Topics  . Alcohol use: Yes    Comment: socially  . Drug use: Yes    Types: Marijuana, MDMA (Ecstacy)     Allergies   Lactose intolerance (gi) and Tape   Review of Systems Review of Systems   Physical Exam Triage Vital Signs ED Triage Vitals  Enc Vitals Group     BP 11/28/19 1716 126/86     Pulse Rate 11/28/19 1716 70     Resp 11/28/19 1716 18     Temp 11/28/19 1716 98 F (36.7 C)     Temp Source 11/28/19 1716 Oral     SpO2 11/28/19 1716 99 %     Weight --       Height --      Head Circumference --      Peak Flow --      Pain Score 11/28/19 1714 1     Pain Loc --      Pain Edu? --      Excl. in GC? --    No data found.  Updated Vital Signs BP 126/86 (BP Location: Right Arm)   Pulse 70   Temp 98 F (36.7 C) (Oral)   Resp 18   LMP 08/21/2019   SpO2 99%    Physical Exam Constitutional:      General: She is not in acute distress.    Appearance: She is well-developed.  Cardiovascular:     Rate and Rhythm: Normal rate.  Pulmonary:     Effort: Pulmonary effort is normal.  Abdominal:     Palpations: Abdomen is not rigid.     Tenderness: There is no abdominal tenderness. There is no guarding or rebound.  Genitourinary:    Comments: Denies sores, lesions, vaginal bleeding; no pelvic pain; gu exam deferred at this time, vaginal self swab collected.   Skin:    General: Skin is warm and dry.  Neurological:     Mental Status: She is alert and oriented to person, place, and time.      UC Treatments / Results  Labs (all labs ordered are listed, but only abnormal results are displayed) Labs Reviewed  CERVICOVAGINAL ANCILLARY ONLY    EKG   Radiology No results found.  Procedures Procedures (including critical care time)  Medications Ordered in UC Medications  azithromycin (ZITHROMAX) tablet 1,000 mg (has no administration in time range)    Initial Impression / Assessment and Plan / UC Course  I have reviewed the triage vital signs and the nursing notes.  Pertinent labs & imaging results that were available during my care of the patient were reviewed by me and considered in my medical decision making (see chart for details).     Results reviewed and noted recent positive for chlamydia. Azithromycin provided here today. Vaginal cytology for bv and yeast collected and pending as well. Safe sex encouraged. Patient verbalized understanding and agreeable to plan.   Final Clinical Impressions(s) / UC Diagnoses   Final  diagnoses:  Chlamydia     Discharge Instructions     We  have treated you today for chlamydia.  We are testing you additionally for yeast and bacterial vaginosis.  We will notify of you any positive findings or if any changes to treatment are needed. If normal or otherwise without concern to your results, we will not call you. Please log on to your MyChart to review your results if interested in so.   Please withhold from intercourse for the next week. Please use condoms to prevent STD's.      ED Prescriptions    None     PDMP not reviewed this encounter.   Georgetta Haber, NP 11/28/19 1738

## 2019-11-29 LAB — CERVICOVAGINAL ANCILLARY ONLY
Bacterial Vaginitis (gardnerella): POSITIVE — AB
Candida Glabrata: NEGATIVE
Candida Vaginitis: NEGATIVE
Comment: NEGATIVE
Comment: NEGATIVE
Comment: NEGATIVE

## 2019-11-30 ENCOUNTER — Telehealth (HOSPITAL_COMMUNITY): Payer: Self-pay | Admitting: Emergency Medicine

## 2019-11-30 MED ORDER — METRONIDAZOLE 500 MG PO TABS: 500.0000 mg | ORAL_TABLET | Freq: Two times a day (BID) | ORAL | 0 refills | Status: DC

## 2019-12-25 ENCOUNTER — Ambulatory Visit (HOSPITAL_COMMUNITY): Payer: No Typology Code available for payment source

## 2020-01-07 ENCOUNTER — Ambulatory Visit (HOSPITAL_COMMUNITY)
Admission: EM | Admit: 2020-01-07 | Discharge: 2020-01-07 | Disposition: A | Discharge status: Home / Self Care | Payer: Medicaid Other | Attending: Emergency Medicine | Admitting: Emergency Medicine

## 2020-01-07 ENCOUNTER — Encounter (HOSPITAL_COMMUNITY): Payer: Self-pay

## 2020-01-07 ENCOUNTER — Other Ambulatory Visit: Payer: Self-pay

## 2020-01-07 DIAGNOSIS — Z79899 Other long term (current) drug therapy: Secondary | ICD-10-CM | POA: Insufficient documentation

## 2020-01-07 DIAGNOSIS — Z20822 Contact with and (suspected) exposure to covid-19: Secondary | ICD-10-CM | POA: Diagnosis not present

## 2020-01-07 DIAGNOSIS — Z888 Allergy status to other drugs, medicaments and biological substances status: Secondary | ICD-10-CM | POA: Diagnosis not present

## 2020-01-07 DIAGNOSIS — Z3202 Encounter for pregnancy test, result negative: Secondary | ICD-10-CM

## 2020-01-07 DIAGNOSIS — F17210 Nicotine dependence, cigarettes, uncomplicated: Secondary | ICD-10-CM | POA: Insufficient documentation

## 2020-01-07 DIAGNOSIS — R509 Fever, unspecified: Secondary | ICD-10-CM | POA: Diagnosis not present

## 2020-01-07 DIAGNOSIS — J069 Acute upper respiratory infection, unspecified: Secondary | ICD-10-CM

## 2020-01-07 LAB — RESP PANEL BY RT-PCR (FLU A&B, COVID) ARPGX2
Influenza A by PCR: NEGATIVE
Influenza B by PCR: NEGATIVE
SARS Coronavirus 2 by RT PCR: NEGATIVE

## 2020-01-07 LAB — POC URINE PREG, ED: Preg Test, Ur: NEGATIVE

## 2020-01-07 NOTE — ED Triage Notes (Addendum)
Pt in with c/o productive cough that has been going on for almost 1 week. Also c/o subjective fever and runny nose  Pt has not had medication for sxs  Denies any N/V, diarrhea  Pt also requesting pregnancy test

## 2020-01-07 NOTE — ED Provider Notes (Signed)
[facility]    CSN: [id number] Arrival date & time: 01/07/20  1737      History   Chief Complaint Chief Complaint  Patient presents with  . Cough    HPI [name] is a 19 y.o. female.   Patient presents with 1 week history of cough and runny nose.  She also reports having a temperature of 99.  She denies rash, sore throat, shortness of breath, vomiting, diarrhea, or other symptoms.  Treatment attempted at home with Tylenol.  Patient also requests a pregnancy test.  Medical history includes asthma, substance abuse, suicidal ideation, anxiety, PTSD, depression.  The history is provided by the patient and medical records.    Past Medical History:  Diagnosis Date  . Anxiety   . Asthma   . Headache(784.0)   . Hx of suicide attempt   . Major depressive disorder   . PTSD (post-traumatic stress disorder)     Patient Active Problem List   Diagnosis Date Noted  . MDD (major depressive disorder), recurrent episode, severe (HCC) 12/27/2018  . Severe episode of recurrent major depressive disorder, without psychotic features (HCC) 10/10/2017  . Substance use disorder 06/08/2017  . Elevated blood pressure reading 04/13/2017  . 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 Hx of suicide attempt 03/19/2016  . PTSD (post-traumatic stress disorder) 03/19/2016  . Generalized anxiety disorder   . Suicidal ideation     History reviewed. No pertinent surgical history.  OB History    Gravida  1   Para  1   Term  1   Preterm  0   AB  0   Living  1     SAB  0   IAB  0   Ectopic  0   Multiple  0   Live Births  1            Home Medications    Prior to Admission medications   Medication Sig Start Date End Date Taking? Authorizing Provider  albuterol (VENTOLIN HFA) 108 (90 Base) MCG/ACT inhaler Inhale 1 puff into the lungs every 6 (six) hours as needed for wheezing or shortness of breath. 05/11/19   [name], FNP  ARIPiprazole (ABILIFY) 5 MG tablet Take 1  tablet (5 mg total) by mouth daily. 11/28/19   Money, Gerlene Burdock, FNP  busPIRone (BUSPAR) 10 MG tablet Take 1 tablet (10 mg total) by mouth 2 (two) times daily. 11/27/19   Money, Gerlene Burdock, FNP  cetirizine (ZYRTEC) 10 MG tablet Take 1 tablet (10 mg total) by mouth daily. 06/26/19   Bast, Gloris Manchester A, NP  fluticasone (FLONASE) 50 MCG/ACT nasal spray Place 1 spray into both nostrils daily. 06/26/19   Dahlia Byes A, NP  hydrOXYzine (ATARAX/VISTARIL) 25 MG tablet Take 1 tablet (25 mg total) by mouth 3 (three) times daily as needed for anxiety. 11/27/19   Money, Gerlene Burdock, FNP  lamoTRIgine (LAMICTAL) 25 MG tablet Take 1 tablet (25 mg total) by mouth every morning. 11/28/19   Money, Gerlene Burdock, FNP  metroNIDAZOLE (FLAGYL) 500 MG tablet Take 1 tablet (500 mg total) by mouth 2 (two) times daily. 11/30/19   Lamptey, Britta Mccreedy, MD  prazosin (MINIPRESS) 1 MG capsule Take 1 capsule (1 mg total) by mouth at bedtime. 11/27/19   Money, Gerlene Burdock, FNP  traZODone (DESYREL) 100 MG tablet Take 4 tablets (400 mg total) by mouth at bedtime as needed for sleep. 11/27/19   Money, Gerlene Burdock, FNP    Family History Family History  Problem Relation Age of Onset  . Diabetes Maternal Aunt   . Diabetes Maternal Grandmother   . Cancer Maternal Grandmother   . Breast cancer Other     Social History Social History   Tobacco Use  . Smoking status: Current Every Day Smoker    Packs/day: 0.50    Types: Cigarettes, Cigars  . Smokeless tobacco: Never Used  . Tobacco comment: black and mild  Vaping Use  . Vaping Use: Every day  Substance Use Topics  . Alcohol use: Yes    Comment: socially  . Drug use: Yes    Types: Marijuana, MDMA (Ecstacy)     Allergies   Lactose intolerance (gi) and Tape   Review of Systems Review of Systems  Constitutional: Negative for chills and fever.  HENT: Positive for rhinorrhea. Negative for ear pain and sore throat.   Eyes: Negative for pain and visual disturbance.  Respiratory: Positive for cough.  Negative for shortness of breath.   Cardiovascular: Negative for chest pain and palpitations.  Gastrointestinal: Negative for abdominal pain, diarrhea, nausea and vomiting.  Genitourinary: Negative for dysuria and hematuria.  Musculoskeletal: Negative for arthralgias and back pain.  Skin: Negative for color change and rash.  Neurological: Negative for seizures and syncope.  All other systems reviewed and are negative.    Physical Exam Triage Vital Signs ED Triage Vitals  Enc Vitals Group     BP 01/07/20 1846 (!) 142/84     Pulse Rate 01/07/20 1846 91     Resp 01/07/20 1846 18     Temp 01/07/20 1846 99.2 F (37.3 C)     Temp Source 01/07/20 1846 Oral     SpO2 01/07/20 1846 99 %     Weight --      Height --      Head Circumference --      Peak Flow --      Pain Score 01/07/20 1844 0     Pain Loc --      Pain Edu? --      Excl. in GC? --    No data found.  Updated Vital Signs BP (!) 142/84 (BP Location: Left Arm)   Pulse 91   Temp 99.2 F (37.3 C) (Oral)   Resp 18   LMP 12/31/2019 (Approximate)   SpO2 99%   Visual Acuity Right Eye Distance:   Left Eye Distance:   Bilateral Distance:    Right Eye Near:   Left Eye Near:    Bilateral Near:     Physical Exam Vitals and nursing note reviewed.  Constitutional:      General: She is not in acute distress.    Appearance: She is well-developed and well-nourished. She is not ill-appearing.  HENT:     Head: Normocephalic and atraumatic.     Right Ear: Tympanic membrane normal.     Left Ear: Tympanic membrane normal.     Nose: Nose normal.     Mouth/Throat:     Mouth: Mucous membranes are moist.     Pharynx: Oropharynx is clear.  Eyes:     Conjunctiva/sclera: Conjunctivae normal.  Cardiovascular:     Rate and Rhythm: Normal rate and regular rhythm.     Heart sounds: Normal heart sounds.  Pulmonary:     Effort: Pulmonary effort is normal. No respiratory distress.     Breath sounds: Normal breath sounds. No  wheezing or rhonchi.  Abdominal:     Palpations: Abdomen is soft.     Tenderness:  There is no abdominal tenderness. There is no guarding or rebound.  Musculoskeletal:        General: No edema.     Cervical back: Neck supple.  Skin:    General: Skin is warm and dry.     Findings: No rash.  Neurological:     General: No focal deficit present.     Mental Status: She is alert and oriented to person, place, and time.     Gait: Gait normal.  Psychiatric:        Mood and Affect: Mood and affect and mood normal.        Behavior: Behavior normal.      UC Treatments / Results  Labs (all labs ordered are listed, but only abnormal results are displayed) Labs Reviewed  RESP PANEL BY RT-PCR (FLU A&B, COVID) [id number]  POC URINE PREG, ED    EKG   Radiology No results found.  Procedures Procedures (including critical care time)  Medications Ordered in UC Medications - No data to display  Initial Impression / Assessment and Plan / UC Course  I have reviewed the triage vital signs and the nursing notes.  Pertinent labs & imaging results that were available during my care of the patient were reviewed by me and considered in my medical decision making (see chart for details).   Viral URI with cough.  Patient request for pregnancy test.  Urine pregnancy negative.  Influenza and COVID pending.  Instructed patient to self quarantine until the test results are back.  Discussed symptomatic treatment including Tylenol, rest, hydration.  Instructed patient to follow up with PCP if her symptoms are not improving  Patient agrees to plan of care.    Final Clinical Impressions(s) / UC Diagnoses   Final diagnoses:  Viral URI with cough  Pregnancy examination or test, negative result     Discharge Instructions     Your pregnancy test is negative.    Your COVID and Flu tests are pending.  You should self quarantine until the test results are back.    Take Tylenol or ibuprofen as needed for  fever or discomfort.  Rest and keep yourself hydrated.    Follow-up with your primary care provider if your symptoms are not improving.        ED Prescriptions    None     PDMP not reviewed this encounter.   Mickie Bail, NP 01/07/20 [facility] [place]

## 2020-01-07 NOTE — Discharge Instructions (Signed)
Your pregnancy test is negative.    Your COVID and Flu tests are pending.  You should self quarantine until the test results are back.    Take Tylenol or ibuprofen as needed for fever or discomfort.  Rest and keep yourself hydrated.    Follow-up with your primary care provider if your symptoms are not improving.

## 2020-01-08 ENCOUNTER — Ambulatory Visit (INDEPENDENT_AMBULATORY_CARE_PROVIDER_SITE_OTHER): Payer: Medicaid Other | Admitting: Clinical

## 2020-01-08 DIAGNOSIS — F332 Major depressive disorder, recurrent severe without psychotic features: Secondary | ICD-10-CM

## 2020-01-08 DIAGNOSIS — F1210 Cannabis abuse, uncomplicated: Secondary | ICD-10-CM

## 2020-01-08 DIAGNOSIS — F1010 Alcohol abuse, uncomplicated: Secondary | ICD-10-CM

## 2020-01-08 DIAGNOSIS — F1410 Cocaine abuse, uncomplicated: Secondary | ICD-10-CM
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
## 2020-01-22 ENCOUNTER — Other Ambulatory Visit (HOSPITAL_COMMUNITY)
Admission: RE | Admit: 2020-01-22 | Discharge: 2020-01-22 | Disposition: A | Discharge status: Home / Self Care | Payer: Medicaid Other | Source: Ambulatory Visit | Attending: Advanced Practice Midwife | Admitting: Advanced Practice Midwife

## 2020-01-22 ENCOUNTER — Encounter: Payer: Self-pay | Admitting: Advanced Practice Midwife

## 2020-01-22 ENCOUNTER — Other Ambulatory Visit: Payer: Self-pay

## 2020-01-22 ENCOUNTER — Telehealth (HOSPITAL_COMMUNITY): Payer: Self-pay | Admitting: *Deleted

## 2020-01-22 ENCOUNTER — Ambulatory Visit (INDEPENDENT_AMBULATORY_CARE_PROVIDER_SITE_OTHER): Payer: Medicaid Other | Admitting: Advanced Practice Midwife

## 2020-01-22 VITALS — BP 125/76 | HR 69 | Ht 69.0 in | Wt 259.0 lb

## 2020-01-22 DIAGNOSIS — Z01419 Encounter for gynecological examination (general) (routine) without abnormal findings: Secondary | ICD-10-CM | POA: Diagnosis present

## 2020-01-22 DIAGNOSIS — F332 Major depressive disorder, recurrent severe without psychotic features: Secondary | ICD-10-CM | POA: Diagnosis not present

## 2020-01-22 DIAGNOSIS — Z3202 Encounter for pregnancy test, result negative: Secondary | ICD-10-CM | POA: Diagnosis not present

## 2020-01-22 LAB — POCT URINE PREGNANCY: Preg Test, Ur: NEGATIVE

## 2020-01-22 NOTE — Progress Notes (Addendum)
GYN presents for AEX/PAP/STD screening.  C/o back pain 10/10 wants breast reduction, wants surgery to fix perianal tear, which is causing pain during sex and yellow discharge, odor.  PHQ-9=26
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
## 2020-01-22 NOTE — Telephone Encounter (Signed)
Call from patient requesting emergency medications to last her till her appt on 02/19/20 with [name] PA. She was seen in [facility] in Nov. And started back on meds and has been out for several weeks and calling now. Will forward her concern to Eddie to see if he will bridge her with her medications till her appt in approx one month.

## 2020-01-23 LAB — CERVICOVAGINAL ANCILLARY ONLY
Bacterial Vaginitis (gardnerella): POSITIVE — AB
Candida Glabrata: NEGATIVE
Candida Vaginitis: NEGATIVE
Chlamydia: NEGATIVE
Comment: NEGATIVE
Comment: NEGATIVE
Comment: NEGATIVE
Comment: NEGATIVE
Comment: NEGATIVE
Comment: NORMAL
Neisseria Gonorrhea: NEGATIVE
Trichomonas: NEGATIVE

## 2020-01-23 LAB — RPR: RPR Ser Ql: NONREACTIVE

## 2020-01-23 LAB — HIV ANTIBODY (ROUTINE TESTING W REFLEX): HIV Screen 4th Generation wRfx: NONREACTIVE

## 2020-01-24 ENCOUNTER — Other Ambulatory Visit (HOSPITAL_COMMUNITY): Payer: Self-pay | Admitting: Physician Assistant

## 2020-01-24 DIAGNOSIS — F411 Generalized anxiety disorder: Secondary | ICD-10-CM

## 2020-01-24 DIAGNOSIS — F99 Mental disorder, not otherwise specified: Secondary | ICD-10-CM

## 2020-01-24 DIAGNOSIS — F5105 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 disorder: Secondary | ICD-10-CM | POA: Insufficient documentation

## 2020-01-24 DIAGNOSIS — F3110 Bipolar disorder, current episode manic without psychotic features, unspecified: Secondary | ICD-10-CM | POA: Insufficient documentation

## 2020-01-24 DIAGNOSIS — F4310 Post-traumatic stress disorder, unspecified: Secondary | ICD-10-CM

## 2020-01-24 MED ORDER — TRAZODONE HCL 100 MG PO TABS: 400.0000 mg | ORAL_TABLET | Freq: Every evening | ORAL | 0 refills | Status: DC | PRN

## 2020-01-24 MED ORDER — PRAZOSIN HCL 1 MG PO CAPS
1.0000 mg | ORAL_CAPSULE | Freq: Every day | ORAL | 0 refills | Status: DC
Start: 2020-01-24 — End: 2020-02-19

## 2020-01-24 MED ORDER — ARIPIPRAZOLE 5 MG PO TABS: 5.0000 mg | ORAL_TABLET | Freq: Every day | ORAL | 0 refills | Status: DC

## 2020-01-24 MED ORDER — BUSPIRONE HCL 10 MG PO TABS: 10.0000 mg | ORAL_TABLET | Freq: Two times a day (BID) | ORAL | 0 refills | Status: DC

## 2020-01-24 MED ORDER — LAMOTRIGINE 25 MG PO TABS: 25.0000 mg | ORAL_TABLET | Freq: Every morning | ORAL | 0 refills | Status: DC

## 2020-01-24 MED ORDER — HYDROXYZINE HCL 25 MG PO TABS
25.0000 mg | ORAL_TABLET | Freq: Three times a day (TID) | ORAL | 0 refills | Status: DC | PRN
Start: 2020-01-24 — End: 2020-02-19

## 2020-01-24 NOTE — Progress Notes (Signed)
[name] is a 19 year old female with a past psychiatric history significant for bipolar disorder, PTSD, generalized anxiety, and insomnia/sleep disturbances who was recently hospitalized at [facility] due to suicidal ideations.  Patient was discharged on November 2nd, 2021 on the following medications:  Lamictal 25 mg by mouth daily Abilify 5 mg by mouth daily Prazosin 1 mg by mouth at bedtime BuSpar 10 mg by mouth 2 times daily Trazodone 400 mg at bedtime Vistaril 25 mg by mouth 3 times daily as needed  Patient called [facility] to request refills on her medications because she had run out before her outpatient follow-up appointment.  Writer was able to contact patient today.  Patient reports that she had run out of her medications halfway through the month and has been trying to contact her psychiatrist in order to get a refill.  Patient states that she is feeling slightly manic and her anxiety and irritability have been elevated for the past few days.  Writer informed patient that refills would be E prescribed to pharmacy of choice.  Patient was agreeable to plan.  Patient has a follow-up appointment scheduled with this writer on February 19, 2020.

## 2020-01-24 NOTE — Telephone Encounter (Signed)
Patient was contact today. Writer was able to verify the medications that patient is currently out of. Writer informed patient that medications would be e-prescribed to pharmacy of choice.  Patient was agreeable to plan.  Patient has an follow-up appointment scheduled with writer on January 25th 2022.

## 2020-01-28 ENCOUNTER — Ambulatory Visit: Payer: Medicaid Other | Admitting: Family Medicine

## 2020-01-28 NOTE — Progress Notes (Deleted)
   Subjective:    Patient ID: [name], female    DOB: November 03, 2000, 20 y.o.   MRN: [id number]   CC: New Patient  HPI: PMHx: Past Medical History:  Diagnosis Date  . Anxiety   . Asthma   . Headache(784.0)   . Hx of suicide attempt   . Major depressive disorder   . PTSD (post-traumatic stress disorder)   Bipolar disorder Elevated blood pressure HSV-2 infection Insomnia Substance use disorder    Surgical Hx: No past surgical history on file.   Family Hx: Family History  Problem Relation Age of Onset  . Diabetes Maternal Aunt   . Diabetes Maternal Grandmother   . Cancer Maternal Grandmother   . Breast cancer Other      Social Hx: Who lives at home: *** 01/28/2020  Who would speak for you about health care matters: *** 01/28/2020 Transportation: *** 01/28/2020 Important Relationships & Pets: *** 01/28/2020  Current Stressors: *** 01/28/2020 Work / Education:  *** 01/28/2020 Religious / Personal Beliefs: *** 01/28/2020 Interests / Fun: *** 01/28/2020 Tobacco:*** Alcohol:*** Drugs: *** Illicit Drugs:***  reports that she has been smoking cigarettes and cigars. She has been smoking about 0.50 packs per day. She has never used smokeless tobacco. She reports current alcohol use. She reports current drug use. Drugs: Marijuana and MDMA (Ecstacy).  LMP: Contraception:  Medications:   ROS: Patient reports no  vision/ hearing changes,anorexia, weight change, fever ,adenopathy, persistant / recurrent hoarseness, swallowing issues, chest pain, edema,persistant / recurrent cough, hemoptysis, dyspnea(rest, exertional, paroxysmal nocturnal), gastrointestinal  bleeding (melena, rectal bleeding), abdominal pain, excessive heart burn, GU symptoms(dysuria, hematuria, pyuria, voiding/incontinence  Issues) syncope, focal weakness, severe memory loss, concerning skin lesions, depression, anxiety, abnormal bruising/bleeding, major joint swelling, breast masses or abnormal vaginal bleeding.     Preventative Screening Colonoscopy: Not indicated. No family history of GI cancer Mammogram: Not indicated. No family history of breast cancer Pap test: Due DEXA: Not indicated. No history of eating disorders, low BMI Tetanus vaccine: 04/01/16 Pneumonia vaccine: year*** results *** Shingles vaccine: year*** results *** Heart stress test: none Echocardiogram: None Xrays: CXR (02/19/19): normal CT/MRI: CT abd (06/27/19): normal  Health Maintenance Due  Topic Date Due  . COVID-19 Vaccine (1) Never done  . INFLUENZA VACCINE  08/26/2019  . PAP-Cervical Cytology Screening  Never done  . PAP SMEAR-Modifier  Never done     Smoking status reviewed  Review of Systems   Objective:  LMP 12/31/2019 (Approximate)  Vitals and nursing note reviewed  General: well nourished, in no acute distress HEENT: normocephalic, TM's visualized bilaterally, no scleral icterus or conjunctival pallor, no nasal discharge, moist mucous membranes, good dentition without erythema or discharge noted in posterior oropharynx Neck: supple, non-tender, without lymphadenopathy Cardiac: RRR, clear S1 and S2, no murmurs, rubs, or gallops Respiratory: clear to auscultation bilaterally, no increased work of breathing Abdomen: soft, nontender, nondistended, no masses or organomegaly. Bowel sounds present Extremities: no edema or cyanosis. Warm, well perfused. 2+ radial and PT pulses bilaterally Skin: warm and dry, no rashes noted Neuro: alert and oriented, no focal deficits   Assessment & Plan:    No problem-specific Assessment & Plan notes found for this encounter.    No follow-ups on file.   Joana Reamer, DO [facility], PGY2 01/28/2020 8:10 AM

## 2020-02-03 LAB — CYTOLOGY - PAP
Comment: NEGATIVE
Diagnosis: UNDETERMINED — AB
High risk HPV: POSITIVE — AB

## 2020-02-05 ENCOUNTER — Ambulatory Visit: Payer: Medicaid Other | Admitting: Family Medicine

## 2020-02-06 ENCOUNTER — Other Ambulatory Visit: Payer: Self-pay

## 2020-02-06 ENCOUNTER — Telehealth: Payer: Self-pay

## 2020-02-06 MED ORDER — METRONIDAZOLE 500 MG PO TABS: 500.0000 mg | ORAL_TABLET | Freq: Two times a day (BID) | ORAL | 0 refills | Status: DC

## 2020-02-06 NOTE — Telephone Encounter (Signed)
TC from patient regarding test results for 01/22/2020. It looks like results had not been reviewed.I will forwarded the results to the provider who saw her in the office. Will send in flagyl for BV

## 2020-02-06 NOTE — Telephone Encounter (Signed)
Patient tested positive for BV so I will send in Flagyl to help with these symptoms.

## 2020-02-12 ENCOUNTER — Ambulatory Visit: Payer: Medicaid Other | Admitting: Family Medicine

## 2020-02-12 ENCOUNTER — Other Ambulatory Visit: Payer: Medicaid Other

## 2020-02-19 ENCOUNTER — Telehealth (INDEPENDENT_AMBULATORY_CARE_PROVIDER_SITE_OTHER): Payer: Medicaid Other | Admitting: Physician Assistant

## 2020-02-19 ENCOUNTER — Other Ambulatory Visit: Payer: Self-pay

## 2020-02-19 ENCOUNTER — Encounter (HOSPITAL_COMMUNITY): Payer: Self-pay | Admitting: Physician Assistant

## 2020-02-19 DIAGNOSIS — F411 Generalized anxiety disorder: Secondary | ICD-10-CM

## 2020-02-19 DIAGNOSIS — F4310 Post-traumatic stress disorder, unspecified: Secondary | ICD-10-CM

## 2020-02-19 DIAGNOSIS — F3110 Bipolar disorder, current episode manic without psychotic features, unspecified: Secondary | ICD-10-CM

## 2020-02-19 DIAGNOSIS — F5105 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 disorder: Secondary | ICD-10-CM | POA: Diagnosis not present

## 2020-02-19 DIAGNOSIS — F99 Mental disorder, not otherwise specified: Secondary | ICD-10-CM

## 2020-02-19 MED ORDER — ARIPIPRAZOLE 5 MG PO TABS: 5.0000 mg | ORAL_TABLET | Freq: Every day | ORAL | 1 refills | Status: DC

## 2020-02-19 MED ORDER — TRAZODONE HCL 100 MG PO TABS: 400.0000 mg | ORAL_TABLET | Freq: Every evening | ORAL | 1 refills | Status: DC | PRN

## 2020-02-19 MED ORDER — HYDROXYZINE HCL 25 MG PO TABS
25.0000 mg | ORAL_TABLET | Freq: Three times a day (TID) | ORAL | 1 refills | Status: DC | PRN
Start: 2020-02-19 — End: 2020-03-28

## 2020-02-19 MED ORDER — BUSPIRONE HCL 10 MG PO TABS: 10.0000 mg | ORAL_TABLET | Freq: Two times a day (BID) | ORAL | 1 refills | Status: DC

## 2020-02-19 MED ORDER — PRAZOSIN HCL 1 MG PO CAPS: 1.0000 mg | ORAL_CAPSULE | Freq: Every day | ORAL | 1 refills | Status: DC

## 2020-02-19 MED ORDER — LAMOTRIGINE 25 MG PO TABS: 25.0000 mg | ORAL_TABLET | Freq: Every morning | ORAL | 1 refills | Status: DC
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
## 2020-03-06 ENCOUNTER — Telehealth (HOSPITAL_COMMUNITY): Payer: Self-pay | Admitting: Clinical

## 2020-03-06 ENCOUNTER — Ambulatory Visit (INDEPENDENT_AMBULATORY_CARE_PROVIDER_SITE_OTHER): Payer: Medicaid Other | Admitting: Clinical

## 2020-03-06 ENCOUNTER — Other Ambulatory Visit: Payer: Self-pay

## 2020-03-06 DIAGNOSIS — F3110 Bipolar disorder, current episode manic without psychotic features, unspecified: Secondary | ICD-10-CM | POA: Diagnosis not present

## 2020-03-06 NOTE — Progress Notes (Signed)
   THERAPIST PROGRESS NOTE Virtual Visit via Video Note  I connected with [name] on 03/06/2020 at  8:00 AM EST by a video enabled telemedicine application and verified that I am speaking with the correct person using two identifiers.  Location: Patient: home Provider: office   I discussed the limitations of evaluation and management by telemedicine and the availability of in person appointments. The patient expressed understanding and agreed to proceed.   Follow Up Instructions: I discussed the assessment and treatment plan with the patient. The patient was provided an opportunity to ask questions and all were answered. The patient agreed with the plan and demonstrated an understanding of the instructions.   The patient was advised to call back or seek an in-person evaluation if the symptoms worsen or if the condition fails to improve as anticipated.   Session Time: 40 minutes  Participation Level: Active  Behavioral Response: CasualAlertDepressed  Type of Therapy: Individual Therapy  Treatment Goals addressed: Diagnosis: depression  Interventions: CBT  Summary:  [name] is a 20 y.o. female who presents session oriented times five, appropriately dressed, and friendly. Client denied hallucinations and delusions. Client reported on today things have been "decent". Client reported that she has moved to a new a new housing location and has a studio apartment her her and her son. Client reported it is a nice and clean place. Client reported she has been struggling with her daily routine of self care. Client reported she has fallen out of routine with taking her medication with a result of her mood fluctuating. Client reported it has been four days since she took her medication. Client reported "I don't know why I haven't I know I think better when I take it". Client discussed in the past that not taking her medication has led to poor choices and relapse with substance use. Client  reported she has started going to NA meetings weekly. Client reported she is attending online school at [employer] school for criminal justice.    Suicidal/Homicidal: Nowithout intent/plan  Therapist Response:  Therapist began the session checking in and asking how she has been since the last session. Therapist actively listened to the clients thoughts and feelings. Therapist used CBT and asked open ended questions about barriers that prevent her from being compliant to medication management. Therapist assigned the client to work on creating a morning routine so she can stay on track with self care, eating, and taking her medication daily. Client was scheduled for next appointment.    Plan: Return again in 5 weeks for individual therapy.  Diagnosis: Bipolar affective disorder, current episode manic current episode severity unspecified  [name], LCSW 03/06/2020

## 2020-03-09 NOTE — Progress Notes (Signed)
Subjective:    Patient ID: [name], female    DOB: [birth year], 20 y.o.   MRN: [id number]   CC: New Patient  HPI: PMHx: Past Medical History:  Diagnosis Date  . Anxiety   . Asthma   . Headache(784.0)   . Hx of suicide attempt   . Major depressive disorder   . 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 Last outbreak was 2019.  - MDD / GAD / PTSD / Bipolar disorder: Follows at [facility] outpatient clinic and [facility] (therapy, peer support, and employment support, [facility]SI program (psychosocial rehab - M-F from 9-3 with daily living skills, work skills, help with mental health). Last seen on 02/19/20. She was observed overnight in Nov 2021 due to suicidal ideations. Medications include: Lamictal 37m QD, Abilify 557mQD, Prazosin 29m25mHS, Buspar 38m92mD, Trazodone 400mg60m, and Hydroxyzine 25mg 57mPRN. History of suicidal attempts, last in August where she attempted overdose on home sleeping medications. Denies any recent attempts. Does endorse passive thoughts of suicide. Denies any plan. Protective measures: her son. If in a crisis, she has a safety plan in place including coping strategies and crisis numbers which includes her grandmother, mother in law, son's grandmother, crisis advisor, then 911. S16notes that her symptoms do feel are worse as of lately due to a recent loss. She saw her peer support yesterday. Patient has a busy today today which will help. Plans to go to [facility] tomorrow for more support. - Homelessness: currently lives in shelter. Feels safe. [facility]. Medicaid. Currently on waiting list for housing.  Surgical Hx: No past surgical history on file.  Family Hx: Family History  Problem Relation Age of Onset  . Diabetes Maternal Aunt   . Diabetes Maternal Grandmother   . Cancer Maternal Grandmother   . Breast cancer Maternal  Grandmother   . Breast cancer Other   Maternal Grandmother diagnosed with breast cancer in 40's.  Mother had breast biopsies as a young teen, negative for cancer.  Social Hx: Who lives at home: Patient and son live at shelter  03/13/2020  Who would speak for you about health care matters: Fiance: CannisHulen [phone] Transportation: [employer]  03/13/20/53/6644 / Education:  Graduated high school, getting in BachelLockhartiminal justice at [employer]), work at [employer] [facility] [employer] 03/13/2020 Religious / Personal Beliefs: None 03/13/2020  Tobacco: 3/4 PPD x since 13 yea39 old. Recently worsening due to stopping drinking  Alcohol: Currently abstinent, going to NA meetings. Used to have drinking problem. Drugs: history of Marijuana daily, quit recently, recently quit cocaine use Illicit Drugs: History of using ecstasy   reports that she has been smoking cigarettes and cigars. She has been smoking about 0.50 packs per day. She has never used smokeless tobacco. She reports current alcohol use. She reports current drug use. Drugs: Marijuana and MDMA (Ecstacy).  LMP 03/03/20 Sexually active: Female currently, female and female   Medications: Lamictal 25mg Q68milify 5mg QD 19mzosin 29mg qHS 70mpar 38mg BID 28modone 400mg qHS  52moxyzine 25mg TID PR38mterizine 38mg daily  57mase  Albuterol PRN  ROS: shoulder/back pain, depression/anxiety Patient reports no  vision/ hearing changes,anorexia, weight change, fever ,adenopathy, persistant / recurrent hoarseness, swallowing issues, chest pain, edema,persistant / recurrent cough, hemoptysis, dyspnea(rest, exertional, paroxysmal nocturnal), gastrointestinal  bleeding (melena, rectal bleeding),  abdominal pain, excessive heart burn, GU symptoms(dysuria, hematuria, pyuria, voiding/incontinence  Issues) syncope, focal weakness, severe memory loss, concerning skin lesions, depression, anxiety, abnormal  bruising/bleeding, major joint swelling, breast masses or abnormal vaginal bleeding.     Preventative Screening Colonoscopy: N/A Mammogram: N/A Denies family history of breast cancer Pap test: 01/22/20 - ASCUS with high risk HPV. Follows with OBGYN. Plan for repeat 1 year DEXA: N/A Tetanus vaccine: 04/01/2016 Pneumonia vaccine: none Shingles vaccine: not indicated Heart stress test: none Echocardiogram: none Xrays: CXR (02/19/19): negative  Left humerus XR (11/19/18): No fracture or dislocation of left humerus. There is a linear radiopaque foreign body in the medial soft tissues of the distal upper arm, in keeping with reported Nexplanon implant.  CXR (10/2016): negative  Pelvic XR (2016): moderate osteitis pubis, femoral heads and hip joints normal  Lumbar spine 2014: negative CT/MRI: CT abd/Pelvis (05/2019): negative, CT abd/pelvis 10/2018): negative, CT abd/pelvis (02/19/18): negative; Pelvic ultrasound (01/2018): negative for torsion, normal ovaries, normal endometrium; Pelvic ultrasound (02/22/2017): retroflexed uterus, IUS in place, otherwise normal; Pelvic ultrasound (11/2016): IUD in appropriate position. Otherwise normal appearance of uterus and ovaries. No pelvic mass or other significant abnormality identified. Renal Stone CT (10/2016): Mild urinary distention of the bladder without focal mural thickening or calculus. No obstructive uropathy or nephrolithiasis. 2. Increased colonic stool burden query constipation RUQ ultrasound (10/2016): negative   Health Maintenance Due  Topic Date Due  . INFLUENZA VACCINE  08/26/2019     Smoking status reviewed  Review of Systems   Objective:  BP 110/76   Pulse 68   Ht 5' 9" (1.753 m)   Wt 242 lb 12.8 oz (110.1 kg)   LMP 03/03/2020 (Exact Date)   SpO2 98%   BMI 35.86 kg/m  Vitals and nursing note reviewed  General: well nourished, in no acute distress HEENT: normocephalic, TM's visualized bilaterally, no scleral icterus or  conjunctival pallor, no nasal discharge, moist mucous membranes, good dentition without erythema or discharge noted in posterior oropharynx Neck: supple, non-tender, without lymphadenopathy Cardiac: RRR, clear S1 and S2, no murmurs, rubs, or gallops Respiratory: clear to auscultation bilaterally, no increased work of breathing Abdomen: soft, nontender, nondistended, no masses or organomegaly. Bowel sounds present Extremities: no edema or cyanosis. Warm, well perfused. 2+ radial and PT pulses bilaterally Skin: warm and dry, no rashes noted Neuro: alert and oriented, no focal deficits  Depression screen [facility] 2/9 03/12/2020 01/22/2020 10/31/2017  Decreased Interest _0 Down, Depressed, Hopeless _1 PHQ - 2 Score _2 Altered sleeping _3 Tired, decreased energy _4 Change in appetite _5 Feeling bad or failure about yourself  _6 Trouble concentrating _7 Moving slowly or fidgety/restless _8 Suicidal thoughts 3 2 -  PHQ-9 Score _9 Difficult doing work/chores Extremely dIfficult Not difficult at all -  Some encounter information is confidential and restricted. Go to Review Flowsheets activity to see all data.  Some recent data might be hidden     Assessment & Plan:   Establish care: Patient here today to establish care.  She was previously seen by Pediatrics. PMH, surgical history, and social history were reviewed. The following concerns below were discussed.   Asthma due to seasonal allergies Chronic. Intermittent. Primarily during allergy season. Seldom use of inhaler. - albuterol PRN, rx provided  Severe episode of recurrent major depressive disorder, without psychotic features (  HCC) Chronic. Severe. PHQ-9 score of 25 today with positive answer to #9. Endorses passive thoughts of suicide but no plan or intent. Does have history of suicidal attempt in past requiring hospitalization. Protective measures include her son. She follows closely with  [facility] outpatient clinic for medication management and [facility] for therapy, peer support, and employment support, [facility] program (psychosocial rehab - M-F from 9-3 with daily living skills, work skills, help with mental health). She has a safety plan in place including coping strategies and crises numbers. She plan to follow up with [employer].  - continue current medications as prescribed by Behavioral health - Follow up with [facility] as scheduled, sooner if worsening - After extensive conversation, patient was deemed safe to leave clinic and return home with close follow up with [facility] and [facility]. - Safety plan discussed with the patient. They are aware of how to contact crisis services if need be and instructed to go to the nearest emergency room if they feel they are in imminent danger of harming themselves and or others, or symptoms are of out control or unbearable.   PTSD (post-traumatic stress disorder) Chronic. Follows with [facility].  Currently on Prazosin 62m qHS. Follow up scheduled in 4 weeks as scehduled.  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 [facility] Current medications include Trazodone 405mqHS PRN for sleep.  Generalized anxiety disorder Chronic. [facility] Currently on Buspar 104mID, Hydroxyzine 16m17mN for anxiety. Follow up scheduled in 4 weeks as scheduled.  Bipolar affective disorder, current episode manic (HCC) Chronic. [facility] Current medications include Lamotrigine 16mg73mand Ability 5mg Q51mFollow up scheduled in 4 weeks as scheduled.   Homelessness Currently homeless staying in shelter. Feels safe. On housing wait list. [facility] and [facility]  [facility]istance but patient declined. Can consider referral to [facility] if desired in future.  Family history of breast cancer Endorses breast cancer in maternal grandmother and great grandmother. Possibly diagnosed in 40's. Likely benefit from referral to genetics for BRCA testing. Will discuss at annual visit.  Elevated blood pressure reading Normotensive today. Will continue to monitor.  HSV-2 infection Chronic. Only intermittent breakouts with Valtrex PRN.  Denies any active lesions. Patient considering starting prophylactic treatment although infrequent outbreaks. Recommended follow up to further discuss if desired.  Seasonal allergies Chronic. Well controlled with Zyrtec 10mg Q73m RX provided  Tobacco abuse Chronic. Smoke 3/4 PPD x 6 years (since 13 year68old). Does not qualify for lung cancer screen at this time. Patient was counseled on the risks of tobacco use and cessation strongly encouraged. Plan to continue to encourage cessation and discuss nicotine replacement at follow up visit  ASCUS with positive high risk HPV cervical Most recent Papsmear on 12/2019 notable for ASCUS with high risk HPV. Plan for repeat in 1 year. Follows with OBGYN  Health Maintenance: Declined Flu vaccine COVID given today  No follow-ups on file.   KiersteDanna HeftyneMuenster2/17/2022 1:16 PM

## 2020-03-12 ENCOUNTER — Other Ambulatory Visit: Payer: Self-pay

## 2020-03-12 ENCOUNTER — Ambulatory Visit (INDEPENDENT_AMBULATORY_CARE_PROVIDER_SITE_OTHER): Payer: Medicaid Other

## 2020-03-12 ENCOUNTER — Encounter: Payer: Self-pay | Admitting: Family Medicine

## 2020-03-12 ENCOUNTER — Ambulatory Visit (INDEPENDENT_AMBULATORY_CARE_PROVIDER_SITE_OTHER): Payer: Medicaid Other | Admitting: Family Medicine

## 2020-03-12 VITALS — BP 110/76 | HR 68 | Ht 69.0 in | Wt 242.8 lb

## 2020-03-12 DIAGNOSIS — F99 Mental disorder, not otherwise specified: Secondary | ICD-10-CM

## 2020-03-12 DIAGNOSIS — R030 Elevated blood-pressure reading, without diagnosis of hypertension: Secondary | ICD-10-CM | POA: Diagnosis not present

## 2020-03-12 DIAGNOSIS — Z7689 Persons encountering health services in other specified circumstances: Secondary | ICD-10-CM | POA: Diagnosis present

## 2020-03-12 DIAGNOSIS — R87810 Cervical high risk human papillomavirus (HPV) DNA test positive: Secondary | ICD-10-CM | POA: Diagnosis not present

## 2020-03-12 DIAGNOSIS — J45909 Unspecified asthma, uncomplicated: Secondary | ICD-10-CM

## 2020-03-12 DIAGNOSIS — Z720 Tobacco use: Secondary | ICD-10-CM | POA: Diagnosis not present

## 2020-03-12 DIAGNOSIS — R87610 Atypical squamous cells of undetermined significance on cytologic smear of cervix (ASC-US): Secondary | ICD-10-CM | POA: Diagnosis not present

## 2020-03-12 DIAGNOSIS — F332 Major depressive disorder, recurrent severe without psychotic features: Secondary | ICD-10-CM | POA: Diagnosis not present

## 2020-03-12 DIAGNOSIS — Z5900 Homelessness unspecified: Secondary | ICD-10-CM | POA: Diagnosis not present

## 2020-03-12 DIAGNOSIS — F3110 Bipolar disorder, current episode manic without psychotic features, unspecified: Secondary | ICD-10-CM

## 2020-03-12 DIAGNOSIS — J302 Other seasonal allergic rhinitis: Secondary | ICD-10-CM | POA: Diagnosis not present

## 2020-03-12 DIAGNOSIS — F4310 Post-traumatic stress disorder, unspecified: Secondary | ICD-10-CM

## 2020-03-12 DIAGNOSIS — Z803 Family history of malignant neoplasm of breast: Secondary | ICD-10-CM

## 2020-03-12 DIAGNOSIS — F411 Generalized anxiety disorder: Secondary | ICD-10-CM | POA: Diagnosis not present

## 2020-03-12 DIAGNOSIS — Z23 Encounter for immunization: Secondary | ICD-10-CM | POA: Diagnosis present

## 2020-03-12 DIAGNOSIS — F5105 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 disorder: Secondary | ICD-10-CM

## 2020-03-12 DIAGNOSIS — B009 Herpesviral infection, unspecified: Secondary | ICD-10-CM

## 2020-03-12 DIAGNOSIS — F1990 Other psychoactive substance use, unspecified, uncomplicated: Secondary | ICD-10-CM | POA: Diagnosis not present

## 2020-03-12 MED ORDER — ALBUTEROL SULFATE HFA 108 (90 BASE) MCG/ACT IN AERS: 1.0000 | INHALATION_SPRAY | Freq: Four times a day (QID) | RESPIRATORY_TRACT | 2 refills | Status: DC | PRN

## 2020-03-12 MED ORDER — FLUTICASONE PROPIONATE 50 MCG/ACT NA SUSP: 1.0000 | Freq: Every day | NASAL | 2 refills | Status: DC

## 2020-03-12 MED ORDER — CETIRIZINE HCL 10 MG PO TABS: 10.0000 mg | ORAL_TABLET | Freq: Every day | ORAL | 11 refills | Status: DC

## 2020-03-12 NOTE — Patient Instructions (Signed)
It was a pleasure to see you today!  Thank you for choosing [facility] for your primary care.   Please schedule annual exam at your earliest convenience for physical exam, labs, and immunizations.   To keep you healthy, please keep in mind the following health maintenance items that you are due for:   1. COVID 2.  FLU  Best Wishes,   Orpah Cobb, DO

## 2020-03-13 DIAGNOSIS — R87810 Cervical high risk human papillomavirus (HPV) DNA test positive: Secondary | ICD-10-CM | POA: Insufficient documentation

## 2020-03-13 DIAGNOSIS — Z720 Tobacco use: Secondary | ICD-10-CM | POA: Insufficient documentation

## 2020-03-13 DIAGNOSIS — R87610 Atypical squamous cells of undetermined significance on cytologic smear of cervix (ASC-US): Secondary | ICD-10-CM | POA: Insufficient documentation

## 2020-03-13 DIAGNOSIS — J302 Other seasonal allergic rhinitis: Secondary | ICD-10-CM

## 2020-03-13 DIAGNOSIS — J45909 Unspecified asthma, uncomplicated: Secondary | ICD-10-CM | POA: Insufficient documentation

## 2020-03-13 DIAGNOSIS — Z5900 Homelessness unspecified: Secondary | ICD-10-CM | POA: Insufficient documentation

## 2020-03-13 DIAGNOSIS — F17200 Nicotine dependence, unspecified, uncomplicated: Secondary | ICD-10-CM | POA: Insufficient documentation

## 2020-03-13 DIAGNOSIS — Z803 Family history of malignant neoplasm of breast: Secondary | ICD-10-CM | POA: Insufficient documentation

## 2020-03-13 HISTORY — DX: Other seasonal allergic rhinitis: J30.2

## 2020-03-13 HISTORY — DX: Unspecified asthma, uncomplicated: J45.909

## 2020-03-13 HISTORY — DX: Atypical squamous cells of undetermined significance on cytologic smear of cervix (ASC-US): R87.610

## 2020-03-13 NOTE — Assessment & Plan Note (Signed)
Chronic. Severe. PHQ-9 score of 25 today with positive answer to #9. Endorses passive thoughts of suicide but no plan or intent. Does have history of suicidal attempt in past requiring hospitalization. Protective measures include her son. She follows closely with [facility] outpatient clinic for medication management and [facility] for therapy, peer support, and employment support, [facility] program (psychosocial rehab - M-F from 9-3 with daily living skills, work skills, help with mental health). She has a safety plan in place including coping strategies and crises numbers. She plan to follow up with [employer].  - continue current medications as prescribed by Behavioral health - Follow up with [facility] as scheduled, sooner if worsening - After extensive conversation, patient was deemed safe to leave clinic and return home with close follow up with [facility] and [facility]. - Safety plan discussed with the patient. They are aware of how to contact crisis services if need be and instructed to go to the nearest emergency room if they feel they are in imminent danger of harming themselves and or others, or symptoms are of out control or unbearable.

## 2020-03-13 NOTE — Assessment & Plan Note (Signed)
Chronic. Currently mamanged by [facility] Out[facility] center. Current medications include Trazodone 400mg  qHS PRN for sleep.

## 2020-03-13 NOTE — Assessment & Plan Note (Signed)
Normotensive today. Will continue to monitor.

## 2020-03-13 NOTE — Assessment & Plan Note (Signed)
Endorses breast cancer in maternal grandmother and great grandmother. Possibly diagnosed in 40's. Likely benefit from referral to genetics for BRCA testing. Will discuss at annual visit.

## 2020-03-13 NOTE — Assessment & Plan Note (Signed)
Most recent Papsmear on 12/2019 notable for ASCUS with high risk HPV. Plan for repeat in 1 year. Follows with OBGYN

## 2020-03-13 NOTE — Assessment & Plan Note (Signed)
Chronic. Follows with [facility].  Currently on Prazosin 1mg  qHS. Follow up scheduled in 4 weeks as scehduled.

## 2020-03-13 NOTE — Assessment & Plan Note (Signed)
Currently homeless staying in shelter. Feels safe. On housing wait list. [name] resources and support through [place]. Offered further assistance but patient declined. Can consider referral to [facility] if desired in future.

## 2020-03-13 NOTE — Assessment & Plan Note (Signed)
Chronic. Well controlled with Zyrtec 10mg  QD. - RX provided

## 2020-03-13 NOTE — Assessment & Plan Note (Signed)
Chronic. Intermittent. Primarily during allergy season. Seldom use of inhaler. - albuterol PRN, rx provided

## 2020-03-13 NOTE — Assessment & Plan Note (Signed)
Chronic. Follows with [facility] center. Current medications include Lamotrigine 25mg  QD and Ability 5mg  QD. Follow up scheduled in 4 weeks as scheduled.

## 2020-03-13 NOTE — Assessment & Plan Note (Signed)
Chronic. Smoke 3/4 PPD x 6 years (since 20 years old). Does not qualify for lung cancer screen at this time. Patient was counseled on the risks of tobacco use and cessation strongly encouraged. Plan to continue to encourage cessation and discuss nicotine replacement at follow up visit

## 2020-03-13 NOTE — Assessment & Plan Note (Signed)
Chronic. Follows with [facility] center. Currently on Buspar 10mg  BID, Hydroxyzine 25mg  PRN for anxiety. Follow up scheduled in 4 weeks as scheduled.

## 2020-03-13 NOTE — Assessment & Plan Note (Signed)
Chronic. Only intermittent breakouts with Valtrex PRN.  Denies any active lesions. Patient considering starting prophylactic treatment although infrequent outbreaks. Recommended follow up to further discuss if desired.

## 2020-03-17 DIAGNOSIS — Z23 Encounter for immunization: Secondary | ICD-10-CM

## 2020-03-18 ENCOUNTER — Other Ambulatory Visit: Payer: Self-pay

## 2020-03-18 ENCOUNTER — Encounter (HOSPITAL_COMMUNITY): Payer: Self-pay | Admitting: Emergency Medicine

## 2020-03-18 ENCOUNTER — Emergency Department (HOSPITAL_COMMUNITY)
Admission: EM | Admit: 2020-03-18 | Discharge: 2020-03-19 | Disposition: A | Discharge status: Home / Self Care | Payer: Medicaid Other | Attending: Emergency Medicine | Admitting: Emergency Medicine

## 2020-03-18 DIAGNOSIS — F17210 Nicotine dependence, cigarettes, uncomplicated: Secondary | ICD-10-CM | POA: Insufficient documentation

## 2020-03-18 DIAGNOSIS — J45909 Unspecified asthma, uncomplicated: Secondary | ICD-10-CM | POA: Insufficient documentation

## 2020-03-18 DIAGNOSIS — B349 Viral infection, unspecified: Secondary | ICD-10-CM | POA: Insufficient documentation

## 2020-03-18 DIAGNOSIS — R079 Chest pain, unspecified: Secondary | ICD-10-CM | POA: Diagnosis not present

## 2020-03-18 DIAGNOSIS — J029 Acute pharyngitis, unspecified: Secondary | ICD-10-CM | POA: Diagnosis present

## 2020-03-18 NOTE — ED Triage Notes (Signed)
Patient reports left shoulder pain injured 2 weeks ago ( ran against door frame) , no deformity or swelling , patient added body aches and mild sore throat today , no fever or chills , denies SOB or cough.

## 2020-03-19 ENCOUNTER — Emergency Department (HOSPITAL_COMMUNITY): Payer: Medicaid Other

## 2020-03-19 LAB — GROUP A STREP BY PCR: Group A Strep by PCR: NOT DETECTED

## 2020-03-19 MED ORDER — LIDOCAINE VISCOUS HCL 2 % MT SOLN
15.0000 mL | Freq: Once | OROMUCOSAL | Status: AC
  Administered 2020-03-19: 15 mL via OROMUCOSAL
  Filled 2020-03-19: qty 15

## 2020-03-19 MED ORDER — KETOROLAC TROMETHAMINE 60 MG/2ML IM SOLN
30.0000 mg | Freq: Once | INTRAMUSCULAR | Status: AC
  Administered 2020-03-19: 30 mg via INTRAMUSCULAR
  Filled 2020-03-19: qty 2

## 2020-03-19 MED ORDER — DEXAMETHASONE 4 MG PO TABS
10.0000 mg | ORAL_TABLET | Freq: Once | ORAL | Status: AC
  Administered 2020-03-19: 10 mg via ORAL
  Filled 2020-03-19: qty 3

## 2020-03-19 NOTE — ED Provider Notes (Signed)
[facility] EMERGENCY DEPARTMENT Provider Note   CSN: [id number] Arrival date & time: 03/18/20  2123     History Chief Complaint  Patient presents with   Shoulder Pain / Body Aches    [name] is a 20 y.o. female.   Sore Throat This is a recurrent problem. The current episode started 6 to 12 hours ago. The problem occurs constantly. The problem has been gradually worsening. Associated symptoms include chest pain, abdominal pain and headaches. Pertinent negatives include no shortness of breath. Nothing aggravates the symptoms. Nothing relieves the symptoms. She has tried nothing for the symptoms.       Past Medical History:  Diagnosis Date   Anxiety    Asthma    Headache(784.0)    Hx of suicide attempt    Major depressive disorder    PTSD (post-traumatic stress disorder)     Patient Active Problem List   Diagnosis Date Noted   Asthma due to seasonal allergies 03/13/2020   Seasonal allergies 03/13/2020   Tobacco abuse 03/13/2020   Homelessness 03/13/2020   Family history of breast cancer 03/13/2020   ASCUS with positive high risk HPV cervical 03/13/2020   Bipolar affective disorder, current episode manic (HCC) 01/24/2020   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 disorder 01/24/2020   MDD (major depressive disorder), recurrent episode, severe (HCC) 12/27/2018   Severe episode of recurrent major depressive disorder, without psychotic features (HCC) 10/10/2017   Substance use disorder 06/08/2017   Elevated blood pressure reading 04/13/2017   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   Hx of suicide attempt 03/19/2016   PTSD (post-traumatic stress disorder) 03/19/2016   Generalized anxiety disorder    Suicidal ideation     History reviewed. No pertinent surgical history.   OB History    Gravida  1   Para  1   Term  1   Preterm  0   AB  0   Living  1     SAB  0   IAB  0   Ectopic  0   Multiple  0   Live Births  1            Family History  Problem Relation Age of Onset   Diabetes Maternal Aunt    Diabetes Maternal Grandmother    Cancer Maternal Grandmother    Breast cancer Maternal Grandmother    Breast cancer Other     Social History   Tobacco Use   Smoking status: Current Every Day Smoker    Packs/day: 0.50    Types: Cigarettes, Cigars   Smokeless tobacco: Never Used   Tobacco comment: black and mild  Vaping Use   Vaping Use: Every day  Substance Use Topics   Alcohol use: Yes    Comment: socially   Drug use: Yes    Types: Marijuana, MDMA (Ecstacy)    Home Medications Prior to Admission medications   Medication Sig Start Date End Date Taking? Authorizing Provider  albuterol (VENTOLIN HFA) 108 (90 Base) MCG/ACT inhaler Inhale 1 puff into the lungs every 6 (six) hours as needed for wheezing or shortness of breath. 03/12/20   Mullis, Kiersten P, DO  ARIPiprazole (ABILIFY) 5 MG tablet Take 1 tablet (5 mg total) by mouth daily. 02/19/20   Nwoko, Tommas Olp, PA  busPIRone (BUSPAR) 10 MG tablet Take 1 tablet (10 mg total) by mouth 2 (two) times daily. 02/19/20   Nwoko, Tommas Olp, PA  cetirizine (ZYRTEC) 10 MG tablet Take 1 tablet (10 mg total)  by mouth daily. 03/12/20   Mullis, Kiersten P, DO  fluticasone (FLONASE) 50 MCG/ACT nasal spray Place 1 spray into both nostrils daily. 03/12/20   Mullis, Kiersten P, DO  hydrOXYzine (ATARAX/VISTARIL) 25 MG tablet Take 1 tablet (25 mg total) by mouth 3 (three) times daily as needed for anxiety. 02/19/20   Nwoko, Tommas Olp, PA  lamoTRIgine (LAMICTAL) 25 MG tablet Take 1 tablet (25 mg total) by mouth every morning. 02/19/20   Nwoko, Tommas Olp, PA  prazosin (MINIPRESS) 1 MG capsule Take 1 capsule (1 mg total) by mouth at bedtime. 02/19/20   Nwoko, Tommas Olp, PA  traZODone (DESYREL) 100 MG tablet Take 4 tablets (400 mg total) by mouth at bedtime as needed for sleep. 02/19/20   Meta Hatchet, PA    Allergies    Lactose intolerance (gi) and Tape  Review of  Systems   Review of Systems  Respiratory: Negative for cough and shortness of breath.   Cardiovascular: Positive for chest pain.  Gastrointestinal: Positive for abdominal pain.  Musculoskeletal: Positive for myalgias. Negative for back pain, gait problem and neck pain.  Neurological: Positive for headaches.  All other systems reviewed and are negative.   Physical Exam Updated Vital Signs BP 120/82 (BP Location: Right Arm)    Pulse 80    Temp 98.3 F (36.8 C) (Oral)    Resp 18    Ht 5\' 9"  (1.753 m)    Wt 121 kg    LMP 03/03/2020 (Exact Date)    SpO2 100%    BMI 39.39 kg/m   Physical Exam Vitals and nursing note reviewed.  Constitutional:      Appearance: She is well-developed and well-nourished.  HENT:     Head: Normocephalic and atraumatic.  Cardiovascular:     Rate and Rhythm: Normal rate and regular rhythm.  Pulmonary:     Effort: No respiratory distress.     Breath sounds: No stridor.  Abdominal:     General: There is no distension.  Musculoskeletal:     Cervical back: Normal range of motion.  Neurological:     Mental Status: She is alert.     ED Results / Procedures / Treatments   Labs (all labs ordered are listed, but only abnormal results are displayed) Labs Reviewed  GROUP A STREP BY PCR    EKG None  Radiology No results found.  Procedures Procedures   Medications Ordered in ED Medications  ketorolac (TORADOL) injection 30 mg (30 mg Intramuscular Given 03/19/20 0033)  dexamethasone (DECADRON) tablet 10 mg (10 mg Oral Given 03/19/20 0030)  lidocaine (XYLOCAINE) 2 % viscous mouth solution 15 mL (15 mLs Mouth/Throat Given 03/19/20 0030)    ED Course  I have reviewed the triage vital signs and the nursing notes.  Pertinent labs & imaging results that were available during my care of the patient were reviewed by me and considered in my medical decision making (see chart for details).    MDM Rules/Calculators/A&P                          Strep  negative. Viral illness likely. Will dc pending covid test. otherwise appears well, stable for discharge.   Final Clinical Impression(s) / ED Diagnoses Final diagnoses:  Viral illness  Pharyngitis, unspecified etiology    Rx / DC Orders ED Discharge Orders    None       [name], 03/21/20, MD 03/21/20 [phone]

## 2020-03-19 NOTE — ED Notes (Signed)
Patient verbalized understanding of discharge instructions. Opportunity for questions and answers.  

## 2020-03-19 NOTE — ED Notes (Signed)
Pt reports not having money to call a cab and no way to get home

## 2020-03-19 NOTE — ED Notes (Signed)
Offered pt bus pass

## 2020-03-21 ENCOUNTER — Encounter (HOSPITAL_COMMUNITY): Payer: Self-pay | Admitting: Physician Assistant

## 2020-03-21 ENCOUNTER — Telehealth (INDEPENDENT_AMBULATORY_CARE_PROVIDER_SITE_OTHER): Payer: Medicaid Other | Admitting: Physician Assistant

## 2020-03-21 ENCOUNTER — Other Ambulatory Visit: Payer: Self-pay

## 2020-03-21 DIAGNOSIS — F4310 Post-traumatic stress disorder, unspecified: Secondary | ICD-10-CM

## 2020-03-21 DIAGNOSIS — F5105 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 disorder: Secondary | ICD-10-CM

## 2020-03-21 DIAGNOSIS — F99 Mental disorder, not otherwise specified: Secondary | ICD-10-CM

## 2020-03-21 DIAGNOSIS — F3110 Bipolar disorder, current episode manic without psychotic features, unspecified: Secondary | ICD-10-CM | POA: Diagnosis not present

## 2020-03-21 DIAGNOSIS — F411 Generalized anxiety disorder: Secondary | ICD-10-CM | POA: Diagnosis not present

## 2020-03-21 MED ORDER — ARIPIPRAZOLE 10 MG PO TABS
10.0000 mg | ORAL_TABLET | Freq: Every day | ORAL | 1 refills | Status: DC
Start: 2020-03-21 — End: 2020-03-28

## 2020-03-21 MED ORDER — BUSPIRONE HCL 15 MG PO TABS
15.0000 mg | ORAL_TABLET | Freq: Two times a day (BID) | ORAL | 1 refills | Status: DC
Start: 2020-03-21 — End: 2020-03-28

## 2020-03-21 MED ORDER — ESCITALOPRAM OXALATE 10 MG PO TABS: 10.0000 mg | ORAL_TABLET | Freq: Every day | ORAL | 1 refills | Status: DC

## 2020-03-21 NOTE — Progress Notes (Signed)
BH MD/PA/NP OP Progress Note  Virtual Visit via Telephone Note  I connected with [name] on 03/21/20 at 10:00 AM EST by telephone and verified that I am speaking with the correct person using two identifiers.  Location: Patient: Home Provider: Clinic   I discussed the limitations, risks, security and privacy concerns of performing an evaluation and management service by telephone and the availability of in person appointments. I also discussed with the patient that there may be a patient responsible charge related to this service. The patient expressed understanding and agreed to proceed.  Follow Up Instructions:   I discussed the assessment and treatment plan with the patient. The patient was provided an opportunity to ask questions and all were answered. The patient agreed with the plan and demonstrated an understanding of the instructions.   The patient was advised to call back or seek an in-person evaluation if the symptoms worsen or if the condition fails to improve as anticipated.  I provided 45 minutes of non-face-to-face time during this encounter.   Meta Hatchet, PA    03/21/2020 4:14 PM [name]  MRN:  [id number]  Chief Complaint: Follow up and Medication management  HPI:   [name] is a 20 year old female with a past psychiatric history significant for generalized anxiety disorder, PTSD, bipolar affective disorder, and insomnia who presents to [facility] for follow-up and medication management.  Patient is currently being managed on the following medications:  Buspirone 10 mg 2 times daily Hydroxyzine 25 mg 3 times daily as needed At bedtime Trazodone 400 mg at bedtime Abilify 5 mg daily Lamotrigine 25 mg daily  Patient reports that she has just recently experienced the loss of her [place].  Since experiencing her loss, patient has experienced worsening agitation due to people constantly asking her about whether or  not she is taking her medications.  Patient feels that she is getting aggravated very easily and is scared of how angry she has been getting.  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  She reports that she has also been experiencing panic attacks and has experienced 4 panic attacks since the last encounter.  Patient reports her panic attacks manifest the following symptoms: chest pain, diaphoresis, feelings of immobilization, and the feeling of a dark heavy block on her chest.  Patient rates her current anxiety a 6 out of 10.  Patient reports that her hydroxyzine has not been as helpful in the management of her anxiety and that she experiences some lethargy when taking it.  Patient is calm, cooperative, and fully engaged in conversation during the encounter.  Patient states that her mood is mostly neutral and reports that she feels slightly anxious.  Patient denies active suicidal or homicidal ideations but states that she had some thoughts of harming herself the past few weeks due to her stressors and life-changing events.  Patient states that she feels like a "selfish bitch" when she has those thoughts and states it is not fair to her infant son.  Patient states that when she is feeling suicidal she has a number of people she can call on for safety.  Patient does not feel like a danger towards self today and is able to contract for her safety.  Patient denies auditory or visual hallucinations.  Patient endorses fair sleep and receives on average 4  to 6 hours of intermittent sleep each night.  Patient endorses fluctuating appetite and states that sometimes she does not eat because she does not pick up on her hunger cues.  Patient denies alcohol consumption and illicit drug use.  Patient endorses tobacco use and states that she smokes a half to a whole pack a day.  Visit  Diagnosis:    ICD-10-CM   1. PTSD (post-traumatic stress disorder)  F43.10 escitalopram (LEXAPRO) 10 MG tablet  2. Generalized anxiety disorder  F41.1 busPIRone (BUSPAR) 15 MG tablet    escitalopram (LEXAPRO) 10 MG tablet  3. Bipolar affective disorder, current episode manic, current episode severity unspecified (HCC)  F31.10 ARIPiprazole (ABILIFY) 10 MG tablet    escitalopram (LEXAPRO) 10 MG tablet  4. 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 disorder  F51.05    F99     Past Psychiatric History:  Insomnia Bipolar affective disorder PTSD Generalized anxiety disorder  Past Medical History:  Past Medical History:  Diagnosis Date  . Anxiety   . Asthma   . Headache(784.0)   . Hx of suicide attempt   . Major depressive disorder   . PTSD (post-traumatic stress disorder)    No past surgical history on file.  Family Psychiatric History:  Mother - patient believes her mother is undiagnosed with bipolar disorder Grandmother (maternal) - Bipolar Disorder  Family History:  Family History  Problem Relation Age of Onset  . Diabetes Maternal Aunt   . Diabetes Maternal Grandmother   . Cancer Maternal Grandmother   . Breast cancer Maternal Grandmother   . Breast cancer Other     Social History:  Social History   Socioeconomic History  . Marital status: Single    Spouse name: Not on file  . Number of children: Not on file  . Years of education: Not on file  . Highest education level: Not on file  Occupational History  . Not on file  Tobacco Use  . Smoking status: Current Every Day Smoker    Packs/day: 0.50    Types: Cigarettes, Cigars  . Smokeless tobacco: Never Used  . Tobacco comment: black and mild  Vaping Use  . Vaping Use: Every day  Substance and Sexual Activity  . Alcohol use: Yes    Comment: socially  . Drug use: Yes    Types: Marijuana, MDMA (Ecstacy)  . Sexual activity: Yes    Partners: Male    Birth control/protection: None, Injection  Other Topics Concern  . Not on  file  Social History Narrative  . Not on file   Social Determinants of Health   Financial Resource Strain: Not on file  Food Insecurity: Not on file  Transportation Needs: Not on file  Physical Activity: Not on file  Stress: Not on file  Social Connections: Not on file    Allergies:  Allergies  Allergen Reactions  . Lactose Intolerance (Gi) Nausea And Vomiting and Other (See Comments)  . Tape Other (See Comments)    Slight skin irritation    Metabolic Disorder Labs: Lab Results  Component Value Date   HGBA1C 5.5 05/22/2019   MPG 111 05/22/2019   MPG 111.15 12/28/2018   Lab Results  Component Value Date   PROLACTIN 6.6 12/28/2018   PROLACTIN 5.8 08/23/2017   Lab Results  Component Value Date   CHOL 160 12/28/2018   TRIG 43 12/28/2018   HDL 35 (L) 12/28/2018   CHOLHDL 4.6 12/28/2018   VLDL 9 12/28/2018   LDLCALC 116 (H) 12/28/2018  LDLCALC 149 (H) 03/08/2018   Lab Results  Component Value Date   TSH 0.635 12/28/2018   TSH 0.148 (L) 03/08/2018    Therapeutic Level Labs: No results found for: LITHIUM No results found for: VALPROATE No components found for:  CBMZ  Current Medications: Current Outpatient Medications  Medication Sig Dispense Refill  . escitalopram (LEXAPRO) 10 MG tablet Take 1 tablet (10 mg total) by mouth daily. 30 tablet 1  . albuterol (VENTOLIN HFA) 108 (90 Base) MCG/ACT inhaler Inhale 1 puff into the lungs every 6 (six) hours as needed for wheezing or shortness of breath. 18 g 2  . ARIPiprazole (ABILIFY) 10 MG tablet Take 1 tablet (10 mg total) by mouth daily. 30 tablet 1  . busPIRone (BUSPAR) 15 MG tablet Take 1 tablet (15 mg total) by mouth 2 (two) times daily. 60 tablet 1  . cetirizine (ZYRTEC) 10 MG tablet Take 1 tablet (10 mg total) by mouth daily. 30 tablet 11  . fluticasone (FLONASE) 50 MCG/ACT nasal spray Place 1 spray into both nostrils daily. 16 g 2  . hydrOXYzine (ATARAX/VISTARIL) 25 MG tablet Take 1 tablet (25 mg total) by  mouth 3 (three) times daily as needed for anxiety. 60 tablet 1  . lamoTRIgine (LAMICTAL) 25 MG tablet Take 1 tablet (25 mg total) by mouth every morning. 30 tablet 1  . prazosin (MINIPRESS) 1 MG capsule Take 1 capsule (1 mg total) by mouth at bedtime. 30 capsule 1  . traZODone (DESYREL) 100 MG tablet Take 4 tablets (400 mg total) by mouth at bedtime as needed for sleep. 120 tablet 1   No current facility-administered medications for this visit.     Musculoskeletal: Strength & Muscle Tone: Unable to assess due to telemedicine visit Gait & Station: Unable to assess due to telemedicine visit Patient leans: Unable to assess due to telemedicine visit  Psychiatric Specialty Exam: Review of Systems  Psychiatric/Behavioral: Positive for dysphoric mood and sleep disturbance. Negative for decreased concentration, hallucinations, self-injury and suicidal ideas. The patient is nervous/anxious. The patient is not hyperactive.     Last menstrual period 03/03/2020.There is no height or weight on file to calculate BMI.  General Appearance: Unable to assess due to telemedicine visit  Eye Contact:  Unable to assess due to telemedicine visit  Speech:  Clear and Coherent and Normal Rate  Volume:  Normal  Mood:  Anxious and Dysphoric  Affect:  Congruent and Depressed  Thought Process:  Coherent, Goal Directed and Descriptions of Associations: Intact  Orientation:  Full (Time, Place, and Person)  Thought Content: WDL and Rumination   Suicidal Thoughts:  No  Homicidal Thoughts:  No  Memory:  Immediate;   Good Recent;   Fair Remote;   Fair  Judgement:  Good  Insight:  Fair  Psychomotor Activity:  Normal  Concentration:  Concentration: Good and Attention Span: Good  Recall:  Good  Fund of Knowledge: Good  Language: Good  Akathisia:  NA  Handed:  Right  AIMS (if indicated): not done  Assets:  Communication Skills Desire for Improvement Social Support  ADL's:  Intact  Cognition: WNL  Sleep:   Fair   Screenings: AIMS   Flowsheet Row [facility] (Discharged) [facility] Ad[facility] (Discharged) from [facility] Admission (Discharged) from [facility] Ad[facility] (Dis[facility]) [facility]  AIMS Total Score 0 0 0 0    AUDIT   Flowsheet Row Admission (Discharged) from [facility] Admission (Discharged) from OP Visit from [facility] Admission (Discharged) from OP Visit from [facility] Admission (Discharged) from 12[facility]06[facility]  Alcohol Use Disorder Identification Test Final Score (AUDIT) 2 3 0 1    GAD-7   Flowsheet Row Video Visit from 03/21/2020 in [facility]or from 01/08/2020 in [facility] from 10/31/2017 in [place] [facility] from 08/23/2017 in [place] [facility]  Total GAD-7 Score PHQ2-9   Flowsheet Row Video Visit from 03/21/2020 in [facility] Office Visit from 03/12/2020 in [facility] Office Visit from 01/22/2020 in [facility] Counselor from 01/08/2020 in [facility] from 10/31/2017 in [place] and [facility]  PHQ-2 Total Score PHQ-9 Total Score Flowsheet Row Video Visit from 03/21/2020 in [facility] ED from  03/18/2020 in [facility] EMERGENCY DEPARTMENT Admission (Discharged) from [facility]  C-SSRS RISK CATEGORY High Risk No Risk High Risk       Assessment and Plan:   [name] is a 20 year old female with a past psychiatric history significant for generalized anxiety disorder, PTSD, bipolar affective disorder, and insomnia who presents to [facility] for follow-up and medication management.  Patient reports that she has been experiencing worsening agitation, anxiety, and panic attacks since the last encounter.  Patient attributes her worsening symptoms to the loss of her [place]y [place] and other stressors within her life.  Patient reports that her hydroxyzine has not been helpful in the management of her anxiety.  Patient was recommended discontinuing her hydroxyzine and increasing her dosage of buspirone from 10 mg to 15 mg 2 times daily.  Patient was agreeable to recommendation.    A PHQ 9 screen was performed during the encounter with the patient scoring a 24.  Patient was recommended taking escitalopram 10 mg daily for the management of her depressive symptoms and anxiety.  She was also recommended to increase her dosage of Abilify from 5 mg to 10 mg daily.  Patient was agreeable to recommendations.  Patient's medications will be e-prescribed to pharmacy of choice.  Patient denies active suicidal ideations but states that she has had thoughts of harming herself the past few weeks.  Patient states that she does not feel like a danger to herself and is able to contract for safety following the conclusion of the encounter.  Patient states that she has a number of peers and friends that she is able to reach out to in case of a mental health crisis.  Patient was also informed of the Behavioral Health Urgent Care located on the second floor of the building. Protective factors to her past suicidal  ideations include her infant son and her wife.  1. Generalized anxiety disorder Patient to discontinue taking hydroxyzine 25 mg 3 times daily  -  busPIRone (BUSPAR) 15 MG tablet; Take 1 tablet (15 mg total) by mouth 2 (two) times daily.  Dispense: 60 tablet; Refill: 1 - escitalopram (LEXAPRO) 10 MG tablet; Take 1 tablet (10 mg total) by mouth daily.  Dispense: 30 tablet; Refill: 1  2. Bipolar affective disorder, current episode manic, current episode severity unspecified (HCC) Patient to continue taking lamotrigine 25 mg for the management of her bipolar affective disorder  - ARIPiprazole (ABILIFY) 10 MG tablet; Take 1 tablet (10 mg total) by mouth daily.  Dispense: 30 tablet; Refill: 1 - escitalopram (LEXAPRO) 10 MG tablet; Take 1 tablet (10 mg total) by mouth daily.  Dispense: 30 tablet; Refill: 1  3. PTSD (post-traumatic stress disorder) Patient to continue taking prazosin 1 mg at bedtime for the management of her PTSD related nightmares  - escitalopram (LEXAPRO) 10 MG tablet; Take 1 tablet (10 mg total) by mouth daily.  Dispense: 30 tablet; Refill: 1  4. 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 disorder Patient to continue taking trazodone 400 mg at bedtime  Patient to follow-up in 6 weeks  Meta HatchetUchenna E Rhena Glace, PA 03/21/2020, 4:14 PM

## 2020-03-24 ENCOUNTER — Inpatient Hospital Stay (HOSPITAL_COMMUNITY)
Admission: AD | Admit: 2020-03-24 | Discharge: 2020-03-28 | DRG: 885 | Disposition: A | Discharge status: Home / Self Care | Payer: Medicaid Other | Attending: Psychiatry | Admitting: Psychiatry

## 2020-03-24 DIAGNOSIS — F411 Generalized anxiety disorder: Secondary | ICD-10-CM | POA: Diagnosis present

## 2020-03-24 DIAGNOSIS — G4700 Insomnia, unspecified: Secondary | ICD-10-CM | POA: Diagnosis present

## 2020-03-24 DIAGNOSIS — F410 Panic disorder [episodic paroxysmal anxiety] without agoraphobia: Secondary | ICD-10-CM | POA: Diagnosis present

## 2020-03-24 DIAGNOSIS — F4310 Post-traumatic stress disorder, unspecified: Secondary | ICD-10-CM | POA: Diagnosis present

## 2020-03-24 DIAGNOSIS — R739 Hyperglycemia, unspecified: Secondary | ICD-10-CM | POA: Diagnosis present

## 2020-03-24 DIAGNOSIS — F1994 Other psychoactive substance use, unspecified with psychoactive substance-induced mood disorder: Secondary | ICD-10-CM | POA: Diagnosis present

## 2020-03-24 DIAGNOSIS — F1014 Alcohol abuse with alcohol-induced mood disorder: Secondary | ICD-10-CM | POA: Diagnosis present

## 2020-03-24 DIAGNOSIS — F603 Borderline personality disorder: Secondary | ICD-10-CM | POA: Diagnosis present

## 2020-03-24 DIAGNOSIS — F3110 Bipolar disorder, current episode manic without psychotic features, unspecified: Principal | ICD-10-CM | POA: Diagnosis present

## 2020-03-24 DIAGNOSIS — F17210 Nicotine dependence, cigarettes, uncomplicated: Secondary | ICD-10-CM | POA: Diagnosis present

## 2020-03-24 DIAGNOSIS — Z20822 Contact with and (suspected) exposure to covid-19: Secondary | ICD-10-CM | POA: Diagnosis present

## 2020-03-24 DIAGNOSIS — F5105 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 disorder: Secondary | ICD-10-CM

## 2020-03-24 DIAGNOSIS — F12188 Cannabis abuse with other cannabis-induced disorder: Secondary | ICD-10-CM | POA: Diagnosis present

## 2020-03-24 DIAGNOSIS — F1414 Cocaine abuse with cocaine-induced mood disorder: Secondary | ICD-10-CM | POA: Diagnosis present

## 2020-03-24 DIAGNOSIS — Z79899 Other long term (current) drug therapy: Secondary | ICD-10-CM | POA: Diagnosis not present

## 2020-03-24 DIAGNOSIS — F1990 Other psychoactive substance use, unspecified, uncomplicated: Secondary | ICD-10-CM | POA: Diagnosis present

## 2020-03-24 DIAGNOSIS — F1910 Other psychoactive substance abuse, uncomplicated: Secondary | ICD-10-CM | POA: Diagnosis present

## 2020-03-24 DIAGNOSIS — R45850 Homicidal ideations: Secondary | ICD-10-CM | POA: Diagnosis present

## 2020-03-24 DIAGNOSIS — Z5900 Homelessness unspecified: Secondary | ICD-10-CM | POA: Diagnosis not present

## 2020-03-24 DIAGNOSIS — F3113 Bipolar disorder, current episode manic without psychotic features, severe: Secondary | ICD-10-CM | POA: Diagnosis not present

## 2020-03-24 DIAGNOSIS — F99 Mental disorder, not otherwise specified: Secondary | ICD-10-CM

## 2020-03-24 DIAGNOSIS — R45851 Suicidal ideations: Secondary | ICD-10-CM | POA: Diagnosis present

## 2020-03-24 DIAGNOSIS — Z9151 Personal history of suicidal behavior: Secondary | ICD-10-CM | POA: Diagnosis not present

## 2020-03-24 LAB — RESP PANEL BY RT-PCR (FLU A&B, COVID) ARPGX2
Influenza A by PCR: NEGATIVE
Influenza B by PCR: NEGATIVE
SARS Coronavirus 2 by RT PCR: NEGATIVE

## 2020-03-24 MED ORDER — TRAZODONE HCL 150 MG PO TABS
400.0000 mg | ORAL_TABLET | Freq: Every day | ORAL | Status: DC
  Administered 2020-03-25 (×2): 400 mg via ORAL
  Filled 2020-03-24 (×3): qty 1
  Filled 2020-03-24: qty 2

## 2020-03-24 MED ORDER — LORAZEPAM 1 MG PO TABS
1.0000 mg | ORAL_TABLET | Freq: Four times a day (QID) | ORAL | Status: AC | PRN
  Filled 2020-03-24: qty 1

## 2020-03-24 MED ORDER — ONDANSETRON 4 MG PO TBDP
4.0000 mg | ORAL_TABLET | Freq: Four times a day (QID) | ORAL | Status: AC | PRN
  Administered 2020-03-25 – 2020-03-26 (×2): 4 mg via ORAL
  Filled 2020-03-24 (×3): qty 1

## 2020-03-24 MED ORDER — ADULT MULTIVITAMIN W/MINERALS CH
1.0000 | ORAL_TABLET | Freq: Every day | ORAL | Status: DC
Start: 2020-03-24 — End: 2020-03-25
  Administered 2020-03-25: 1 via ORAL
  Filled 2020-03-24 (×4): qty 1

## 2020-03-24 MED ORDER — ACETAMINOPHEN 325 MG PO TABS
650.0000 mg | ORAL_TABLET | Freq: Four times a day (QID) | ORAL | Status: DC | PRN
  Filled 2020-03-24: qty 2

## 2020-03-24 MED ORDER — PRAZOSIN HCL 1 MG PO CAPS
1.0000 mg | ORAL_CAPSULE | Freq: Every day | ORAL | Status: DC
  Administered 2020-03-25 – 2020-03-27 (×4): 1 mg via ORAL
  Filled 2020-03-24 (×6): qty 1

## 2020-03-24 MED ORDER — ARIPIPRAZOLE 10 MG PO TABS
10.0000 mg | ORAL_TABLET | Freq: Every day | ORAL | Status: DC
  Filled 2020-03-24 (×2): qty 1

## 2020-03-24 MED ORDER — LORATADINE 10 MG PO TABS
10.0000 mg | ORAL_TABLET | Freq: Every day | ORAL | Status: DC
  Filled 2020-03-24 (×2): qty 1

## 2020-03-24 MED ORDER — LOPERAMIDE HCL 2 MG PO CAPS
2.0000 mg | ORAL_CAPSULE | ORAL | Status: AC | PRN
  Filled 2020-03-24: qty 2
  Filled 2020-03-24: qty 1
  Filled 2020-03-24: qty 2

## 2020-03-24 MED ORDER — THIAMINE HCL 100 MG PO TABS
100.0000 mg | ORAL_TABLET | Freq: Every day | ORAL | Status: DC
  Filled 2020-03-24 (×2): qty 1

## 2020-03-24 MED ORDER — LAMOTRIGINE 25 MG PO TABS
25.0000 mg | ORAL_TABLET | Freq: Every day | ORAL | Status: DC
  Filled 2020-03-24 (×2): qty 1

## 2020-03-24 MED ORDER — MAGNESIUM HYDROXIDE 400 MG/5ML PO SUSP
30.0000 mL | Freq: Every day | ORAL | Status: DC | PRN
  Filled 2020-03-24: qty 30

## 2020-03-24 MED ORDER — BUSPIRONE HCL 10 MG PO TABS
10.0000 mg | ORAL_TABLET | Freq: Two times a day (BID) | ORAL | Status: DC
  Administered 2020-03-25: 10 mg via ORAL
  Filled 2020-03-24: qty 1
  Filled 2020-03-24: qty 2
  Filled 2020-03-24 (×3): qty 1

## 2020-03-24 MED ORDER — ALBUTEROL SULFATE HFA 108 (90 BASE) MCG/ACT IN AERS
1.0000 | INHALATION_SPRAY | Freq: Four times a day (QID) | RESPIRATORY_TRACT | Status: DC | PRN
  Filled 2020-03-24: qty 6.7

## 2020-03-24 MED ORDER — ALUM & MAG HYDROXIDE-SIMETH 200-200-20 MG/5ML PO SUSP
30.0000 mL | ORAL | Status: DC | PRN
  Filled 2020-03-24: qty 30

## 2020-03-24 MED ORDER — TRAZODONE HCL 50 MG PO TABS
50.0000 mg | ORAL_TABLET | Freq: Every evening | ORAL | Status: DC | PRN
  Filled 2020-03-24: qty 1

## 2020-03-24 MED ORDER — HYDROXYZINE HCL 25 MG PO TABS
25.0000 mg | ORAL_TABLET | Freq: Four times a day (QID) | ORAL | Status: AC | PRN
  Administered 2020-03-25: 25 mg via ORAL
  Filled 2020-03-24 (×2): qty 1

## 2020-03-24 MED ORDER — ESCITALOPRAM OXALATE 10 MG PO TABS
10.0000 mg | ORAL_TABLET | Freq: Every day | ORAL | Status: DC
  Filled 2020-03-24 (×2): qty 1
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
## 2020-03-24 NOTE — H&P (Signed)
Behavioral Health Medical Screening Exam  [name] is an 20 y.o. female with history of polysubstance use, GAD, PTSD, bipolar disorder, and borderline personality disorder. She is presenting for suicidal thoughts since the death of her grandmother on March 22, 2020. She states that she took #15 Vistaril tablets yesterday morning with intent to hurt herself. She states she slept most of the day yesterday and then woke up in the evening. She continues to report SI and not contracting for safety. She reports daily alcohol use- 1/5 liquor/day, as well as cocaine, ecstasy, and THC use. She reports mood swings and panic attacks. She reports generalized HI, no plan. Also reports AH which are chronic and states voices are telling her she should die.   Total Time spent with patient: 15 minutes  Psychiatric Specialty Exam: Physical Exam Vitals reviewed.  Constitutional:      Appearance: She is well-developed and well-nourished.  Pulmonary:     Effort: Pulmonary effort is normal.  Musculoskeletal:        General: Normal range of motion.  Skin:    General: Skin is warm and dry.  Neurological:     Mental Status: She is alert and oriented to person, place, and time.    Review of Systems  Constitutional: Negative.   Respiratory: Negative for cough and shortness of breath.   Cardiovascular: Negative for chest pain.  Gastrointestinal: Negative for diarrhea, nausea and vomiting.  Neurological: Negative for dizziness, tremors, seizures and headaches.  Psychiatric/Behavioral: Positive for dysphoric mood, hallucinations, self-injury, sleep disturbance and suicidal ideas. Negative for agitation, behavioral problems, confusion and decreased concentration. The patient is nervous/anxious. The patient is not hyperactive.    Blood pressure (!) 150/84, pulse (!) 105, temperature 98.8 F (37.1 C), temperature source Oral, resp. rate 20, last menstrual period 03/03/2020, SpO2 100 %.There is no height or weight on file  to calculate BMI. General Appearance: Casual Eye Contact:  Good Speech:  Normal Rate Volume:  Normal Mood:  Anxious, Depressed and Irritable Affect:  Congruent Thought Process:  Coherent and Descriptions of Associations: Circumstantial Orientation:  Full (Time, Place, and Person) Thought Content:  Logical Suicidal Thoughts:  Yes.  with intent/plan Homicidal Thoughts:  Yes.  without intent/plan Memory:  Immediate;   Fair Recent;   Fair Remote;   Fair Judgement:  Poor Insight:  Fair Psychomotor Activity:  Normal Concentration: Concentration: Fair and Attention Span: Fair Recall:  [employer] of Knowledge:Fair Language: Fair Akathisia:  No Handed:  Right AIMS (if indicated):    Assets:  Communication Skills Desire for Improvement Financial Resources/Insurance Housing Physical Health Social Support Sleep:     Musculoskeletal: Strength & Muscle Tone: within normal limits Gait & Station: normal Patient leans: N/A  Blood pressure (!) 150/84, pulse (!) 105, temperature 98.8 F (37.1 C), temperature source Oral, resp. rate 20, last menstrual period 03/03/2020, SpO2 100 %.  Recommendations: Based on my evaluation the patient does not appear to have an emergency medical condition. Inpatient hospitalization.  Aldean Baker, NP 03/24/2020, 7:50 PM

## 2020-03-24 NOTE — BH Assessment (Signed)
Pt accepted to [facility] 305-2 to Dr. Jola Babinski.  Pt recommended for inpatient by [name], NP. Flowsheet Row OP Visit from 03/24/2020 [facility] Video Visit from 03/21/2020 in [facility] ED from 03/18/2020 in [facility] EMERGENCY DEPARTMENT  C-SSRS RISK CATEGORY High Risk High Risk No Risk

## 2020-03-25 ENCOUNTER — Encounter (HOSPITAL_COMMUNITY): Payer: Self-pay | Admitting: Psychiatric/Mental Health

## 2020-03-25 ENCOUNTER — Other Ambulatory Visit: Payer: Self-pay

## 2020-03-25 DIAGNOSIS — F4310 Post-traumatic stress disorder, unspecified: Secondary | ICD-10-CM | POA: Diagnosis not present

## 2020-03-25 DIAGNOSIS — F411 Generalized anxiety disorder: Secondary | ICD-10-CM | POA: Diagnosis not present

## 2020-03-25 DIAGNOSIS — F3110 Bipolar disorder, current episode manic without psychotic features, unspecified: Principal | ICD-10-CM

## 2020-03-25 DIAGNOSIS — F1994 Other psychoactive substance use, unspecified with psychoactive substance-induced mood disorder: Secondary | ICD-10-CM

## 2020-03-25 DIAGNOSIS — K589 Irritable bowel syndrome without diarrhea: Secondary | ICD-10-CM | POA: Insufficient documentation

## 2020-03-25 DIAGNOSIS — F1990 Other psychoactive substance use, unspecified, uncomplicated: Secondary | ICD-10-CM

## 2020-03-25 HISTORY — DX: Morbid (severe) obesity due to excess calories: E66.01

## 2020-03-25 HISTORY — DX: Irritable bowel syndrome, unspecified: K58.9

## 2020-03-25 LAB — COMPREHENSIVE METABOLIC PANEL
ALT: 25 U/L (ref 0–44)
AST: 21 U/L (ref 15–41)
Albumin: 4 g/dL (ref 3.5–5.0)
Alkaline Phosphatase: 53 U/L (ref 38–126)
Anion gap: 9 (ref 5–15)
BUN: 13 mg/dL (ref 6–20)
CO2: 25 mmol/L (ref 22–32)
Calcium: 8.7 mg/dL — ABNORMAL LOW (ref 8.9–10.3)
Chloride: 105 mmol/L (ref 98–111)
Creatinine, Ser: 0.77 mg/dL (ref 0.44–1.00)
GFR, Estimated: >60 mL/min (ref >60)
Glucose, Bld: 105 mg/dL — ABNORMAL HIGH (ref 70–99)
Potassium: 4 mmol/L (ref 3.5–5.1)
Sodium: 139 mmol/L (ref 135–145)
Total Bilirubin: 0.3 mg/dL (ref 0.3–1.2)
Total Protein: 6.6 g/dL (ref 6.5–8.1)

## 2020-03-25 LAB — HEMOGLOBIN A1C
Hgb A1c MFr Bld: 5.4 % (ref 4.8–5.6)
Mean Plasma Glucose: 108.28 mg/dL

## 2020-03-25 LAB — TSH: TSH: 1.181 u[IU]/mL (ref 0.350–4.500)

## 2020-03-25 LAB — CBC WITH DIFFERENTIAL/PLATELET
Abs Immature Granulocytes: 0.01 10*3/uL (ref 0.00–0.07)
Basophils Absolute: 0 10*3/uL (ref 0.0–0.1)
Basophils Relative: 0 %
Eosinophils Absolute: 0.2 10*3/uL (ref 0.0–0.5)
Eosinophils Relative: 3 %
HCT: 38.4 % (ref 36.0–46.0)
Hemoglobin: 12.4 g/dL (ref 12.0–15.0)
Immature Granulocytes: 0 %
Lymphocytes Relative: 33 %
Lymphs Abs: 2.3 10*3/uL (ref 0.7–4.0)
MCH: 30.8 pg (ref 26.0–34.0)
MCHC: 32.3 g/dL (ref 30.0–36.0)
MCV: 95.3 fL (ref 80.0–100.0)
Monocytes Absolute: 0.5 10*3/uL (ref 0.1–1.0)
Monocytes Relative: 8 %
Neutro Abs: 3.8 10*3/uL (ref 1.7–7.7)
Neutrophils Relative %: 56 %
Platelets: 200 10*3/uL (ref 150–400)
RBC: 4.03 MIL/uL (ref 3.87–5.11)
RDW: 12.7 % (ref 11.5–15.5)
WBC: 6.8 10*3/uL (ref 4.0–10.5)
nRBC: 0 % (ref 0.0–0.2)

## 2020-03-25 LAB — LIPID PANEL
Cholesterol: 152 mg/dL (ref 0–200)
HDL: 50 mg/dL (ref >40)
LDL Cholesterol: 94 mg/dL (ref 0–99)
Total CHOL/HDL Ratio: 3 RATIO
Triglycerides: 41 mg/dL (ref <150)
VLDL: 8 mg/dL (ref 0–40)

## 2020-03-25 LAB — ETHANOL: Alcohol, Ethyl (B): <10 mg/dL (ref <10)

## 2020-03-25 MED ORDER — THIAMINE HCL 100 MG PO TABS
100.0000 mg | ORAL_TABLET | Freq: Every day | ORAL | Status: DC
  Administered 2020-03-25 – 2020-03-28 (×4): 100 mg via ORAL
  Filled 2020-03-25 (×5): qty 1

## 2020-03-25 MED ORDER — ESCITALOPRAM OXALATE 10 MG PO TABS
10.0000 mg | ORAL_TABLET | Freq: Every day | ORAL | Status: DC
  Administered 2020-03-25 – 2020-03-28 (×4): 10 mg via ORAL
  Filled 2020-03-25 (×5): qty 1

## 2020-03-25 MED ORDER — ADULT MULTIVITAMIN W/MINERALS CH
1.0000 | ORAL_TABLET | Freq: Every day | ORAL | Status: DC
  Administered 2020-03-25 – 2020-03-28 (×4): 1 via ORAL
  Filled 2020-03-25 (×5): qty 1

## 2020-03-25 MED ORDER — ARIPIPRAZOLE 10 MG PO TABS
10.0000 mg | ORAL_TABLET | Freq: Every day | ORAL | Status: DC
  Administered 2020-03-25 – 2020-03-28 (×4): 10 mg via ORAL
  Filled 2020-03-25 (×5): qty 1

## 2020-03-25 MED ORDER — LAMOTRIGINE 25 MG PO TABS
25.0000 mg | ORAL_TABLET | Freq: Every day | ORAL | Status: DC
  Administered 2020-03-25 – 2020-03-28 (×4): 25 mg via ORAL
  Filled 2020-03-25 (×5): qty 1

## 2020-03-25 MED ORDER — NICOTINE 21 MG/24HR TD PT24
21.0000 mg | MEDICATED_PATCH | Freq: Every day | TRANSDERMAL | Status: DC
  Administered 2020-03-25 – 2020-03-27 (×3): 21 mg via TRANSDERMAL
  Filled 2020-03-25 (×6): qty 1

## 2020-03-25 MED ORDER — LORATADINE 10 MG PO TABS
10.0000 mg | ORAL_TABLET | Freq: Every day | ORAL | Status: DC
  Administered 2020-03-25 – 2020-03-28 (×4): 10 mg via ORAL
  Filled 2020-03-25 (×5): qty 1

## 2020-03-25 MED ORDER — BUSPIRONE HCL 10 MG PO TABS
10.0000 mg | ORAL_TABLET | Freq: Two times a day (BID) | ORAL | Status: DC
Start: 2020-03-25 — End: 2020-03-28
  Administered 2020-03-25 – 2020-03-28 (×7): 10 mg via ORAL
  Filled 2020-03-25 (×9): qty 1

## 2020-03-25 NOTE — Progress Notes (Signed)
Recreation Therapy Notes  Animal-Assisted Activity (AAA) Program Checklist/Progress Notes Patient Eligibility Criteria Checklist & Daily Group note for Rec Tx Intervention  Date: 03/25/2020 Time: 3:00pm Location: [address]   AAA/T Program Assumption of Risk Form signed by Patient/ or Parent Legal Guardian YES  Patient is free of allergies or severe asthma YES  Patient reports no fear of animals YES  Patient reports no history of cruelty to animals YES  Patient understands their participation is voluntary YES   Behavioral Response: N/A   Clinical Observations/Feedback: Pt did not attend group session.   Nicholos Johns Camdan Burdi, LRT/CTRS Benito Mccreedy Lakisa Lotz 03/25/2020, 4:20 PM

## 2020-03-25 NOTE — Progress Notes (Signed)
Adult Psychoeducational Group Note  Date:  03/25/2020 Time:  9:26 PM  Group Topic/Focus:  Wrap-Up Group:   The focus of this group is to help patients review their daily goal of treatment and discuss progress on daily workbooks.  Participation Level:  Active  Participation Quality:  Appropriate  Affect:  Appropriate  Cognitive:  Appropriate  Insight: Appropriate  Engagement in Group:  Engaged  Modes of Intervention:  Discussion  Additional Comments:  Patient said her day was 6. Her goal for today not to sleep all day. Yes  she achieve her goal. The coping skills are singing, conversation with people.   [name] 03/25/2020, 9:26 PM

## 2020-03-25 NOTE — H&P (Signed)
Psychiatric Admission Assessment Adult  Patient Identification: [name] MRN:  [id number] Date of Evaluation:  03/25/2020 Chief Complaint:  Substance induced mood disorder (HCC) [F19.94] Principal Diagnosis: Bipolar affective disorder, current episode manic (HCC) Diagnosis:  Principal Problem:   Bipolar affective disorder, current episode manic (HCC) Active Problems:   Substance use disorder   Generalized anxiety disorder   PTSD (post-traumatic stress disorder)   Substance induced mood disorder (HCC)  History of Present Illness: Patient is a 20 year old who presented as a walk-in to behavioral health hospital with having thoughts of killing herself by overdosing.  Patient also reports that she has history of previous suicide attempts, was using alcohol, marijuana, ecstasy and cocaine.  Patient on initial presentation reported some homicidal ideation but would not elaborate and also reported hearing voices telling her to be evaluated reporting bad thoughts in her head  This morning, patient reports that she has been struggling with her mood, has a lot of ups and downs, adds that she lost her Laney Potash 2 weeks ago and since then she has been on a downward spiral in regards to her addiction.  Patient states that she was so frustrated with her substance use, affective instability the edition she decided it would be better if she ended her life.  She does report she has supports which include her partner, wants to be there for her child and also states that her grandparents are very supportive.  On a scale of 0-10, with 0 being no symptoms and 10 being the worst, patient reports that her depression fluctuates between a 7 out of 10.  She also reports that she has mood swings which include her being up a lot, using drugs, being irritable, having racing thoughts.  Patient states that she started having substance use when she was 20 years of age, did well in the middle, reports she does well with an NA  sponsor but is no longer attending groups.  She states that she is okay with contacting her sponsor at that helpful.  In regards to her psychotropic medications, patient reports that she follows up at [facility] behavioral health outpatient, states that her medications help.  Patient also reports that she wants to be checked out for sexually transmitted diseases because of her risk-taking behaviors.  Patient states that she can contract for safety on the unit, knows that she needs to work on her coping skills, her addiction and also work on her compliance with medications.  On being questioned about this, patient reports that when she is using drugs, she does not take her medications as prescribed and that worsens her addiction and her mood.  Currently patient denies any hallucinations, reports that she still has suicidal thoughts but can contract for safety on the unit as she does want to get help and get better Associated Signs/Symptoms: Depression Symptoms:  depressed mood, psychomotor agitation, feelings of worthlessness/guilt, difficulty concentrating, hopelessness, suicidal thoughts without plan, loss of energy/fatigue, Duration of Depression Symptoms: Greater than two weeks  (Hypo) Manic Symptoms:  Distractibility, Impulsivity, Labiality of Mood, Anxiety Symptoms:  Excessive Worry, Psychotic Symptoms:  Hallucinations: Auditory Duration of Psychotic Symptoms: Greater than six months  PTSD Symptoms: Had a traumatic exposure:  in the past Total Time spent with patient: 1 hour  Past Psychiatric History:   Is the patient at risk to self? Yes.    Has the patient been a risk to self in the past 6 months? No.  Has the patient been a risk to self within  the distant past? Yes.    Is the patient a risk to others? No.  Has the patient been a risk to others in the past 6 months? No.  Has the patient been a risk to others within the distant past? No.   Prior Inpatient Therapy:   Prior  Outpatient Therapy:    Alcohol Screening: 1. How often do you have a drink containing alcohol?: 4 or more times a week 2. How many drinks containing alcohol do you have on a typical day when you are drinking?: 5 or 6 3. How often do you have six or more drinks on one occasion?: Monthly AUDIT-C Score: 8 4. How often during the last year have you found that you were not able to stop drinking once you had started?: Less than monthly 5. How often during the last year have you failed to do what was normally expected from you because of drinking?: Less than monthly 6. How often during the last year have you needed a first drink in the morning to get yourself going after a heavy drinking session?: Less than monthly 7. How often during the last year have you had a feeling of guilt of remorse after drinking?: Less than monthly 8. How often during the last year have you been unable to remember what happened the night before because you had been drinking?: Less than monthly 9. Have you or someone else been injured as a result of your drinking?: No 10. Has a relative or friend or a doctor or another health worker been concerned about your drinking or suggested you cut down?: Yes, but not in the last year Alcohol Use Disorder Identification Test Final Score (AUDIT): 15 Alcohol Brief Interventions/Follow-up: Alcohol Education Substance Abuse History in the last 12 months:  Yes.   Consequences of Substance Abuse: Withdrawal Symptoms:   Headaches Nausea Previous Psychotropic Medications: Yes  Psychological Evaluations: Yes  Past Medical History:  Past Medical History:  Diagnosis Date  . Anxiety   . Asthma   . Headache(784.0)   . Hx of suicide attempt   . Major depressive disorder   . Morbid obesity (HCC) 03/25/2020  . PTSD (post-traumatic stress disorder)     Past Surgical History:  Procedure Laterality Date  . wisdom tooth extraction     Family History:  Family History  Problem Relation Age of  Onset  . Diabetes Maternal Aunt   . Diabetes Maternal Grandmother   . Cancer Maternal Grandmother   . Breast cancer Maternal Grandmother   . Breast cancer Other    Family Psychiatric  History: history of addiction Tobacco Screening: Have you used any form of tobacco in the last 30 days? (Cigarettes, Smokeless Tobacco, Cigars, and/or Pipes): Yes Tobacco use, Select all that apply: 5 or more cigarettes per day Are you interested in Tobacco Cessation Medications?: Yes, will notify MD for an order Counseled patient on smoking cessation including recognizing danger situations, developing coping skills and basic information about quitting provided: Refused/Declined practical counseling Social History:  Social History   Substance and Sexual Activity  Alcohol Use Yes   Comment: been drinking heavily last 2 weeks; a 1/5 of liquor a day     Social History   Substance and Sexual Activity  Drug Use Yes  . Types: Marijuana, MDMA (Ecstacy), Cocaine   Comment: Coc-last use 2/27; marijuana and MDMA last use 2/28    Additional Social History: Marital status: Married Number of Years Married:  (Pt married wife on 03/18/20.)  Pain Medications: None Prescriptions: See PTA medication listed in EPIC from [facility]. Over the Counter: None History of alcohol / drug use?: Yes Withdrawal Symptoms: Patient aware of relationship between substance abuse and physical/medical complications,Agitation,Nausea / Vomiting,Weakness Name of Substance 1: Marijuana 1 - Age of First Use: 20 years of age 70 - Amount (size/oz): 3-6 blunts in a day 1 - Frequency: Daily 1 - Duration: ongoing 1 - Last Use / Amount: 03/24/20  Four blunts 1- Route of Use: Inhalation Name of Substance 2: Cocaine (powder) 2 - Age of First Use: 20 years of age 60 - Amount (size/oz): Varies according to money 2 - Frequency: 3-5 times in a week 2 - Duration: ongoing 2 - Last Use / Amount: Yesterday (02/27) 2 - Route of Substance Use:  nasal Name of Substance 3: ETOH 3 - Age of First Use: 20 years of age 37 - Amount (size/oz): Will drink about three 16 oz hard drinks (hard lemonade, etc) 3 - Frequency: Daily 3 - Duration: on going 3 - Last Use / Amount: Two days ago 3 - Route of Substance Use: drinking Name of Substance 4: Ecstasy 4 - Age of First Use: 20 years of age 7[birth year] - Amount (size/oz): 1-3 tablets 4 - Frequency: 2-3 times in a week 4 - Duration: on-going 4 - Last Use / Amount: 03/24/20 around 18:00 one tablet 4 - Route of Substance Use: Oral            Allergies:   Allergies  Allergen Reactions  . Lactose Intolerance (Gi) Nausea And Vomiting and Other (See Comments)  . Tape Other (See Comments)    Slight skin irritation   Lab Results:  Results for orders placed or performed during the hospital encounter of 03/24/20 (from the past 48 hour(s))  Resp Panel by RT-PCR (Flu A&B, Covid) Nasopharyngeal Swab     Status: None   Collection Time: 03/24/20  8:18 PM   Specimen: Nasopharyngeal Swab; Nasopharyngeal(NP) swabs in vial transport medium  Result Value Ref Range   SARS Coronavirus 2 by RT PCR NEGATIVE NEGATIVE    Comment: (NOTE) SARS-CoV-2 target nucleic acids are NOT DETECTED.  The SARS-CoV-2 RNA is generally detectable in upper respiratory specimens during the acute phase of infection. The lowest concentration of SARS-CoV-2 viral copies this assay can detect is 138 copies/mL. A negative result does not preclude SARS-Cov-2 infection and should not be used as the sole basis for treatment or other patient management decisions. A negative result may occur with  improper specimen collection/handling, submission of specimen other than nasopharyngeal swab, presence of viral mutation(s) within the areas targeted by this assay, and inadequate number of viral copies(<138 copies/mL). A negative result must be combined with clinical observations, patient history, and epidemiological information. The expected  result is Negative.  Fact Sheet for Patients:  BloggerCourse.com  Fact Sheet for Healthcare Providers:  SeriousBroker.it  This test is no t yet approved or cleared by the [place] FDA and  has been authorized for detection and/or diagnosis of SARS-CoV-2 by FDA under an Emergency Use Authorization (EUA). This EUA will remain  in effect (meaning this test can be used) for the duration of the COVID-19 declaration under Section 564(b)(1) of the Act, 21 U.S.C.section 360bbb-3(b)(1), unless the authorization is terminated  or revoked sooner.       Influenza A by PCR NEGATIVE NEGATIVE   Influenza B by PCR NEGATIVE NEGATIVE    Comment: (NOTE) The Xpert Xpress SARS-CoV-2/FLU/RSV plus assay is intended as an  aid in the diagnosis of influenza from Nasopharyngeal swab specimens and should not be used as a sole basis for treatment. Nasal washings and aspirates are unacceptable for Xpert Xpress SARS-CoV-2/FLU/RSV testing.  Fact Sheet for Patients: BloggerCourse.com  Fact Sheet for Healthcare Providers: SeriousBroker.it  This test is not yet approved or cleared by the [place] FDA and has been authorized for detection and/or diagnosis of SARS-CoV-2 by FDA under an Emergency Use Authorization (EUA). This EUA will remain in effect (meaning this test can be used) for the duration of the COVID-19 declaration under Section 564(b)(1) of the Act, 21 U.S.C. section 360bbb-3(b)(1), unless the authorization is terminated or revoked.  Performed at [facility], [address] [place], [place] [address]   CBC with Differential/Platelet     Status: None   Collection Time: 03/25/20  6:29 AM  Result Value Ref Range   WBC 6.8 4.0 - 10.5 K/uL   RBC 4.03 3.87 - 5.11 MIL/uL   Hemoglobin 12.4 12.0 - 15.0 g/dL   HCT 38.1 82.9 - 93.7 %   MCV 95.3 80.0 - 100.0 fL   MCH 30.8  26.0 - 34.0 pg   MCHC 32.3 30.0 - 36.0 g/dL   RDW 16.9 67.8 - 93.8 %   Platelets 200 150 - 400 K/uL   nRBC 0.0 0.0 - 0.2 %   Neutrophils Relative % 56 %   Neutro Abs 3.8 1.7 - 7.7 K/uL   Lymphocytes Relative 33 %   Lymphs Abs 2.3 0.7 - 4.0 K/uL   Monocytes Relative 8 %   Monocytes Absolute 0.5 0.1 - 1.0 K/uL   Eosinophils Relative 3 %   Eosinophils Absolute 0.2 0.0 - 0.5 K/uL   Basophils Relative 0 %   Basophils Absolute 0.0 0.0 - 0.1 K/uL   Immature Granulocytes 0 %   Abs Immature Granulocytes 0.01 0.00 - 0.07 K/uL    Comment: Performed at [facility], [address] [place], [place] [address]  Comprehensive metabolic panel     Status: Abnormal   Collection Time: 03/25/20  6:29 AM  Result Value Ref Range   Sodium 139 135 - 145 mmol/L   Potassium 4.0 3.5 - 5.1 mmol/L   Chloride 105 98 - 111 mmol/L   CO2 25 22 - 32 mmol/L   Glucose, Bld 105 (H) 70 - 99 mg/dL    Comment: Glucose reference range applies only to samples taken after fasting for at least 8 hours.   BUN 13 6 - 20 mg/dL   Creatinine, Ser 1.02 0.44 - 1.00 mg/dL   Calcium 8.7 (L) 8.9 - 10.3 mg/dL   Total Protein 6.6 6.5 - 8.1 g/dL   Albumin 4.0 3.5 - 5.0 g/dL   AST 21 15 - 41 U/L   ALT 25 0 - 44 U/L   Alkaline Phosphatase 53 38 - 126 U/L   Total Bilirubin 0.3 0.3 - 1.2 mg/dL   GFR, Estimated >58 >52 mL/min    Comment: (NOTE) Calculated using the CKD-EPI Creatinine Equation (2021)    Anion gap 9 5 - 15    Comment: Performed at [facility], [address], [place], [place] [address]  Hemoglobin A1c     Status: None   Collection Time: 03/25/20  6:29 AM  Result Value Ref Range   Hgb A1c MFr Bld 5.4 4.8 - 5.6 %    Comment: (NOTE) Pre diabetes:          5.7%-6.4%  Diabetes:              >  6.4%  Glycemic control for   <7.0% adults with diabetes    Mean Plasma Glucose 108.28 mg/dL    Comment: Performed at [facility], [address], [place], [place] [address]   Ethanol     Status: None   Collection Time: 03/25/20  6:29 AM  Result Value Ref Range   Alcohol, Ethyl (B) <10 <10 mg/dL    Comment: (NOTE) Lowest detectable limit for serum alcohol is 10 mg/dL.  For medical purposes only. Performed at [facility], [address] [place], [place] [address]   Lipid panel     Status: None   Collection Time: 03/25/20  6:29 AM  Result Value Ref Range   Cholesterol 152 0 - 200 mg/dL   Triglycerides 41 <578 mg/dL   HDL 50 >46 mg/dL   Total CHOL/HDL Ratio 3.0 RATIO   VLDL 8 0 - 40 mg/dL   LDL Cholesterol 94 0 - 99 mg/dL    Comment:        Total Cholesterol/HDL:CHD Risk Coronary Heart Disease Risk Table                     Men   Women  1/2 Average Risk   3.4   3.3  Average Risk       5.0   4.4  2 X Average Risk   9.6   7.1  3 X Average Risk  23.4   11.0        Use the calculated Patient Ratio above and the CHD Risk Table to determine the patient's CHD Risk.        ATP III CLASSIFICATION (LDL):  <100     mg/dL   Optimal  962-952  mg/dL   Near or Above                    Optimal  130-159  mg/dL   Borderline  841-324  mg/dL   High  >401     mg/dL   Very High Performed at [facility], [address] [place], [place] [address]   TSH     Status: None   Collection Time: 03/25/20  6:29 AM  Result Value Ref Range   TSH 1.181 0.350 - 4.500 uIU/mL    Comment: Performed by a 3rd Generation assay with a functional sensitivity of <=0.01 uIU/mL. Performed at [facility], [address], [place], [place] [address]     Blood Alcohol level:  Lab Results  Component Value Date   [facility] <10 03/25/2020   ETH <10 12/28/2018    Metabolic Disorder Labs:  Lab Results  Component Value Date   HGBA1C 5.4 03/25/2020   MPG 108.28 03/25/2020   MPG 111 05/22/2019   Lab Results  Component Value Date   PROLACTIN 6.6 12/28/2018   PROLACTIN 5.8 08/23/2017   Lab Results  Component Value Date    CHOL 152 03/25/2020   TRIG 41 03/25/2020   HDL 50 03/25/2020   CHOLHDL 3.0 03/25/2020   VLDL 8 03/25/2020   LDLCALC 94 03/25/2020   LDLCALC 116 (H) 12/28/2018    Current Medications: Current Facility-Administered Medications  Medication Dose Route Frequency Provider Last Rate Last Admin  . acetaminophen (TYLENOL) tablet 650 mg  650 mg Oral Q6H PRN Singleton, Amy E, MD      . albuterol (VENTOLIN HFA) 108 (90 Base) MCG/ACT inhaler 1-2 puff  1-2 puff Inhalation Q6H PRN Comer Locket, MD      .  alum & mag hydroxide-simeth (MAALOX/MYLANTA) 200-200-20 MG/5ML suspension 30 mL  30 mL Oral Q4H PRN Mason Jim, Amy E, MD      . ARIPiprazole (ABILIFY) tablet 10 mg  10 mg Oral Daily Mason Jim, Amy E, MD   10 mg at 03/25/20 0920  . busPIRone (BUSPAR) tablet 10 mg  10 mg Oral BID Comer Locket, MD   10 mg at 03/25/20 0919  . escitalopram (LEXAPRO) tablet 10 mg  10 mg Oral Daily Mason Jim, Amy E, MD   10 mg at 03/25/20 0919  . hydrOXYzine (ATARAX/VISTARIL) tablet 25 mg  25 mg Oral Q6H PRN Comer Locket, MD   25 mg at 03/25/20 1107  . lamoTRIgine (LAMICTAL) tablet 25 mg  25 mg Oral Daily Comer Locket, MD   25 mg at 03/25/20 0919  . loperamide (IMODIUM) capsule 2-4 mg  2-4 mg Oral PRN Mason Jim, Amy E, MD      . loratadine (CLARITIN) tablet 10 mg  10 mg Oral Daily Comer Locket, MD   10 mg at 03/25/20 0919  . LORazepam (ATIVAN) tablet 1 mg  1 mg Oral Q6H PRN Mason Jim, Amy E, MD      . magnesium hydroxide (MILK OF MAGNESIA) suspension 30 mL  30 mL Oral Daily PRN Mason Jim, Amy E, MD      . multivitamin with minerals tablet 1 tablet  1 tablet Oral Daily Mason Jim, Amy E, MD   1 tablet at 03/25/20 0919  . nicotine (NICODERM CQ - dosed in mg/24 hours) patch 21 mg  21 mg Transdermal Daily Bartholomew Crews E, MD   21 mg at 03/25/20 0920  . ondansetron (ZOFRAN-ODT) disintegrating tablet 4 mg  4 mg Oral Q6H PRN Comer Locket, MD   4 mg at 03/25/20 1107  . prazosin (MINIPRESS) capsule 1 mg  1 mg  Oral QHS Mason Jim, Amy E, MD   1 mg at 03/25/20 0015  . thiamine tablet 100 mg  100 mg Oral Daily Mason Jim, Amy E, MD   100 mg at 03/25/20 0919  . traZODone (DESYREL) tablet 400 mg  400 mg Oral QHS Bartholomew Crews E, MD   400 mg at 03/25/20 0014   PTA Medications: Medications Prior to Admission  Medication Sig Dispense Refill Last Dose  . albuterol (VENTOLIN HFA) 108 (90 Base) MCG/ACT inhaler Inhale 1 puff into the lungs every 6 (six) hours as needed for wheezing or shortness of breath. 18 g 2   . ARIPiprazole (ABILIFY) 10 MG tablet Take 1 tablet (10 mg total) by mouth daily. 30 tablet 1   . busPIRone (BUSPAR) 15 MG tablet Take 1 tablet (15 mg total) by mouth 2 (two) times daily. 60 tablet 1   . cetirizine (ZYRTEC) 10 MG tablet Take 1 tablet (10 mg total) by mouth daily. 30 tablet 11   . escitalopram (LEXAPRO) 10 MG tablet Take 1 tablet (10 mg total) by mouth daily. 30 tablet 1   . fluticasone (FLONASE) 50 MCG/ACT nasal spray Place 1 spray into both nostrils daily. 16 g 2   . hydrOXYzine (ATARAX/VISTARIL) 25 MG tablet Take 1 tablet (25 mg total) by mouth 3 (three) times daily as needed for anxiety. 60 tablet 1   . lamoTRIgine (LAMICTAL) 25 MG tablet Take 1 tablet (25 mg total) by mouth every morning. 30 tablet 1   . prazosin (MINIPRESS) 1 MG capsule Take 1 capsule (1 mg total) by mouth at bedtime. 30 capsule 1   . traZODone (DESYREL) 100 MG tablet  Take 4 tablets (400 mg total) by mouth at bedtime as needed for sleep. 120 tablet 1     Musculoskeletal: Strength & Muscle Tone: within normal limits Gait & Station: normal Patient leans: N/A  Psychiatric Specialty Exam: Physical Exam Constitutional:      Appearance: Normal appearance.  HENT:     Head: Normocephalic and atraumatic.     Nose: Nose normal.  Eyes:     Extraocular Movements: Extraocular movements intact.     Conjunctiva/sclera: Conjunctivae normal.     Pupils: Pupils are equal, round, and reactive to light.  Cardiovascular:      Heart sounds: Normal heart sounds.  Pulmonary:     Effort: Pulmonary effort is normal.     Breath sounds: Normal breath sounds.  Musculoskeletal:     Cervical back: Normal range of motion and neck supple.  Neurological:     General: No focal deficit present.     Mental Status: She is alert and oriented to person, place, and time.     Review of Systems  Blood pressure 127/84, pulse 92, temperature 98.2 F (36.8 C), temperature source Oral, resp. rate 18, height  (1.753 m), weight 112.5 kg, last menstrual period 03/03/2020, SpO2 100 %.Body mass index is 36.62 kg/m.  General Appearance: Disheveled  Eye Contact:  Fair  Speech:  Clear and Coherent and Normal Rate  Volume:  Decreased  Mood:  Anxious, Depressed, Dysphoric and Hopeless  Affect:  Congruent, Constricted and Depressed  Thought Process:  Coherent, Linear and Descriptions of Associations: Intact  Orientation:  Full (Time, Place, and Person)  Thought Content:  Logical and Rumination  Suicidal Thoughts:  Yes.  without intent/plan  Homicidal Thoughts:  No  Memory:  Immediate;   Fair Recent;   Fair Remote;   Fair  Judgement:  Poor  Insight:  Present  Psychomotor Activity:  Mannerisms  Concentration:  Concentration: Fair  Recall:  [employer] of Knowledge:  Fair  Language:  Fair  Akathisia:  No  Handed:  Right  AIMS (if indicated):     Assets:  Housing Physical Health Social Support Talents/Skills  ADL's:  Impaired  Cognition:  WNL  Sleep:  Number of Hours: 4.5    Treatment Plan Summary: Daily contact with patient to assess and evaluate symptoms and progress in treatment, Medication management and Plan Patient needs inpatient psychiatric admission for stabilization and treatment  Observation Level/Precautions:  15 minute checks  Laboratory:  cbc,CMP is with normal limits  Psychotherapy: Patient to attend groups  Medications: To be restarted on her home medications as patient reports she was doing fairly  well on the medications  Consultations: None at this time  Discharge Concerns: For patient to safely and effectively participate in outpatient treatment  Estimated LOS: 5 days  Other:     Physician Treatment Plan for Primary Diagnosis: Bipolar affective disorder, current episode manic (HCC) Long Term Goal(s): Improvement in symptoms so as ready for discharge  Short Term Goals: Ability to identify changes in lifestyle to reduce recurrence of condition will improve, Ability to verbalize feelings will improve, Ability to disclose and discuss suicidal ideas and Ability to identify and develop effective coping behaviors will improve  Physician Treatment Plan for Secondary Diagnosis: Principal Problem:   Bipolar affective disorder, current episode manic (HCC) Active Problems:   Substance use disorder   Generalized anxiety disorder   PTSD (post-traumatic stress disorder)   Substance induced mood disorder (HCC)  Long Term Goal(s): Improvement in symptoms so as  ready for discharge  Short Term Goals: Ability to identify changes in lifestyle to reduce recurrence of condition will improve, Ability to identify and develop effective coping behaviors will improve, Ability to maintain clinical measurements within normal limits will improve, Compliance with prescribed medications will improve and Ability to identify triggers associated with substance abuse/mental health issues will improve  I certify that inpatient services furnished can reasonably be expected to improve the patient's condition.    Nelly RoutArchana Johnluke Haugen, MD 3/1/202211:40 AM
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
## 2020-03-25 NOTE — Progress Notes (Signed)
Pt rates her depression 7/10, hopelessness 10/10 and anxiety 2/10. Endorse AVH "I hear these voices and see a group of people. I've been seeing these people since I was had a trauma and my therapist think I never let them go". Sometime the voices tells me to hurt myself". Pt complained of dizziness and fatigue. Tolerated fluids and meals well. Emotional support, reassurance and encouragement provided to pt throughout this shift. Vitals done, WNL. Fluids encouraged and offered. Educated pt on the importance to change position slowly to prevent falls related to dizziness. Q 15 minutes safety checks maintained without self harm gestures or outburst to note thus far. Scheduled and PRN medications given with verbal education and effects monitored. Pt verbalized understanding related to falls prevention. Denies concerns at this time. Remains safe on and off unit. Watching TV in dayroom without issue.
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
## 2020-03-25 NOTE — Tx Team (Signed)
Initial Treatment Plan 03/25/2020 1:44 AM [name] MVH:[id number]    PATIENT STRESSORS: Loss of grandmother died 2 weeks ago Medication change or noncompliance Substance abuse   PATIENT STRENGTHS: Average or above average intelligence Motivation for treatment/growth Supportive family/friends   PATIENT IDENTIFIED PROBLEMS: Suicidal ideations  Alcohol use disorder  Substance use disorder (coc, MDMA, marijuana)  Depression  Anxiety  Anger issues  (pt wants to work on taking her medications consistently and maintaining her sobriety)         DISCHARGE CRITERIA:  Adequate post-discharge living arrangements Improved stabilization in mood, thinking, and/or behavior Motivation to continue treatment in a less acute level of care Verbal commitment to aftercare and medication compliance  PRELIMINARY DISCHARGE PLAN: Attend aftercare/continuing care group Attend PHP/IOP Outpatient therapy Return to previous living arrangement  PATIENT/FAMILY INVOLVEMENT: This treatment plan has been presented to and reviewed with the patient, [name], and/or family member.  The patient and family have been given the opportunity to ask questions and make suggestions.  Victorino December, RN 03/25/2020, 1:44 AM

## 2020-03-25 NOTE — Progress Notes (Cosign Needed)
Adult Psychoeducational Group Note  Date:  03/25/2020 Time:  4:44 PM  Group Topic/Focus:  Goals Group:   The focus of this group is to help patients establish daily goals to achieve during treatment and discuss how the patient can incorporate goal setting into their daily lives to aide in recovery.  Participation Level:  Active  Participation Quality:  Appropriate  Affect:  Appropriate  Cognitive:  Alert  Insight: Appropriate  Engagement in Group:  Engaged  Modes of Intervention:  Discussion  Additional Comments:  Pt attended group and participated in discussion.  Caren R Crotts 03/25/2020, 4:44 PM

## 2020-03-25 NOTE — BHH Suicide Risk Assessment (Signed)
BHH INPATIENT:  Family/Significant Other Suicide Prevention Education  Suicide Prevention Education:  Education Completed; Hulan Fess [phone] (Wife) has been identified by the patient as the family member/significant other with whom the patient will be residing, and identified as the person(s) who will aid the patient in the event of a mental health crisis (suicidal ideations/suicide attempt).  With written consent from the patient, the family member/significant other has been provided the following suicide prevention education, prior to the and/or following the discharge of the patient.  The suicide prevention education provided includes the following:  Suicide risk factors  Suicide prevention and interventions  National Suicide Hotline telephone number  [facility] assessment telephone number  [facility] Emergency Assistance 911  [facility] and/or Residential Mobile Crisis Unit telephone number  Request made of family/significant other to:  Remove weapons (e.g., guns, rifles, knives), all items previously/currently identified as safety concern.    Remove drugs/medications (over-the-counter, prescriptions, illicit drugs), all items previously/currently identified as a safety concern.  The family member/significant other verbalizes understanding of the suicide prevention education information provided.  The family member/significant other agrees to remove the items of safety concern listed above.  CSw spoke with Mrs. Prittard who states that her wife recently lost her maternal grandmother Laney Potash) on 03/10/2020 and that is when the depression and substance use started to get worse. Mrs. Prittard states that her wife is currently taking 8 medications each day for her depression and anxiety.  Mrs. Prittard states that they are also struggling financially and that her wife's Medicaid does not pay for any of her medications. Mrs. Prittard states that she is aware  that her wife is using substances but believes that the substance use is less then what her wife shared with the CSW.  Mrs. Prittard states that she would like her wife to have a list of residential treatment centers but states that she believes she can help her wife get off substance since she also recently stopped using substances as well.  Mrs. Prittard states that there are no weapons or firearms in the home.  CSW completed SPE with Mrs. Prittard.    Metro Kung Editha Bridgeforth 03/25/2020, 11:31 AM

## 2020-03-25 NOTE — Progress Notes (Signed)
   03/24/20 2345  Psych Admission Type (Psych Patients Only)  Admission Status Voluntary  Psychosocial Assessment  Patient Complaints Anger;Anxiety;Anhedonia;Depression;Substance abuse  Eye Contact Fair  Facial Expression Anxious;Sad  Affect Anxious;Depressed;Sad  Speech Logical/coherent  Interaction Assertive  Motor Activity Other (Comment) (wnl)  Appearance/Hygiene Unremarkable  Behavior Characteristics Cooperative;Anxious;Appropriate to situation  Mood Depressed;Anxious;Sad  Thought Administrator, sports thinking  Content Blaming self  Delusions None reported or observed  Perception Hallucinations  Hallucination Auditory  Judgment Poor  Confusion None  Danger to Self  Current suicidal ideation? Passive  Self-Injurious Behavior No self-injurious ideation or behavior indicators observed or expressed   Agreement Not to Harm Self Yes  Description of Agreement verbal agreement to approach staff  Danger to Others  Danger to Others None reported or observed   Pt had a plan to OD on her prescription meds but did not because, "My wife told me that it wouldn't be right for the kids to see me like that. So, I didn't. I took a nap and when I woke up, my family was there and they were doing some sort of intervention. They told me that I was going to the hospital because I needed help." Pt has a 26 and 22 year old at home. Pt's anger is coming from her not coming to terms with her grandmother's death two weeks ago.

## 2020-03-26 LAB — URINALYSIS, ROUTINE W REFLEX MICROSCOPIC
Bilirubin Urine: NEGATIVE
Glucose, UA: NEGATIVE mg/dL
Hgb urine dipstick: NEGATIVE
Ketones, ur: 5 mg/dL — AB
Leukocytes,Ua: NEGATIVE
Nitrite: NEGATIVE
Protein, ur: NEGATIVE mg/dL
Specific Gravity, Urine: 1.026 (ref 1.005–1.030)
pH: 5 (ref 5.0–8.0)

## 2020-03-26 LAB — RAPID URINE DRUG SCREEN, HOSP PERFORMED
Amphetamines: NOT DETECTED
Barbiturates: NOT DETECTED
Benzodiazepines: NOT DETECTED
Cocaine: POSITIVE — AB
Opiates: NOT DETECTED
Tetrahydrocannabinol: POSITIVE — AB

## 2020-03-26 LAB — RPR: RPR Ser Ql: NONREACTIVE

## 2020-03-26 LAB — PREGNANCY, URINE: Preg Test, Ur: NEGATIVE

## 2020-03-26 MED ORDER — TRAZODONE HCL 150 MG PO TABS
300.0000 mg | ORAL_TABLET | Freq: Every day | ORAL | Status: DC
  Administered 2020-03-26 – 2020-03-27 (×2): 300 mg via ORAL
  Filled 2020-03-26 (×3): qty 2

## 2020-03-26 NOTE — BHH Group Notes (Signed)
LCSW Group Therapy Note  Type of Therapy/Topic: Group Therapy: Six Dimensions of Wellness  Participation Level: Active  Description of Group:  This group will address the concept of wellness and the six concepts of wellness: occupational, physical, social, intellectual, spiritual, and emotional. Patients will be encouraged to process areas in their lives that are out of balance and identify reasons for remaining unbalanced. Patients will be encouraged to explore ways to practice healthy habits daily to attain better physical and mental health outcomes.  Therapeutic Goals:  1. Identify aspects of wellness that they are doing well.  2. Identify aspects of wellness that they would like to improve upon.  3. Identify one action they can take to improve an aspect of wellness in their lives.  Summary of Patient Progress:  Selam spent time discussing wellness with her peers during a recreational activity.

## 2020-03-26 NOTE — Progress Notes (Signed)
D: Patient presents with sad affect but is pleasant and cooperative at time of assessment. Patient denies SI/HI at this time. Patient reports AH that are telling her to do impulsive or violent things. Patient also reports VH of people who "have been with me since I was little" and other things she does not elaborate. Patient contracts for safety.  A: Provided positive reinforcement and encouragement.  R: Patient cooperative and receptive to efforts. Patient remains safe on the unit.   03/25/20 2103  Psych Admission Type (Psych Patients Only)  Admission Status Voluntary  Psychosocial Assessment  Patient Complaints Anxiety;Sadness;Irritability  Eye Contact Fair  Facial Expression Sad;Flat  Affect Depressed;Sad  Speech Logical/coherent  Interaction Assertive  Motor Activity Fidgety  Appearance/Hygiene Unremarkable  Behavior Characteristics Cooperative;Appropriate to situation  Mood Anxious;Depressed  Thought Process  Coherency WDL  Content WDL  Delusions None reported or observed  Perception Hallucinations  Hallucination Auditory;Visual  Judgment Poor  Confusion None  Danger to Self  Current suicidal ideation? Denies  Self-Injurious Behavior No self-injurious ideation or behavior indicators observed or expressed   Agreement Not to Harm Self Yes  Description of Agreement verbal contract  Danger to Others  Danger to Others None reported or observed

## 2020-03-26 NOTE — Progress Notes (Signed)
The patient's coping skill that she wishes to use following discharge is to get involved with her spirituality and meditation. Her goal for tomorrow is to find out more about her discharge plans. She also states that she feels a lot happier today and prayed with the chaplain.

## 2020-03-26 NOTE — Progress Notes (Signed)
D:  Patient's self inventory sheet, patient has fair sleep, sleep medication helpful.  Fair appetite, low to normal energy level, good concentration.  Rated depression, denied hopeless, anxiety 4.  Withdrawals, diarrhea, cramping, agitation, nausea, irritability.  Denied SI.  Physical problems, L shoulder, stomach, pain #4.  Goal is not act on manic impulsive thoughts.  Coping skills and communication.  Discharge?  No discharge plans. A:  Medications administered per MD orders.  Emotional support and encouragement given patient. R:  Denied SI and HI, contracts for safety.  Denied A/V hallucinations.  Safety maintained with 15 minute checks.

## 2020-03-26 NOTE — Progress Notes (Signed)
CHaplain provided support with [place] around Health Care Advance Directive and Mental Health Advance Directives.     Provided education, engaged in conversation of support around advance care planning, values, goals, and support structures in [name]'s life.  Christal names her mother and wife as sources of support and hopes to list them on health care power of attorney   She wishes to look over the documents and possibly complete them while at [facility].   This chaplain will follow up with her tomorrow

## 2020-03-26 NOTE — [facility] Counselor (Signed)
[name] spoke with CSW and stated that she plans to move in with her grandparents at discharge so she can avoid the substances and have her child with her.  [name] also states that she is receiving PSR services from [facility] and is already established with [facility] for therapy and medication management.  CSW will continue to discuss discharge planning with [name].

## 2020-03-26 NOTE — Progress Notes (Signed)
Recreation Therapy Notes  Date: 03/26/2020 Time: 930a Location: [address] Dayroom  Group Topic: Stress Management  Goal Area(s) Addresses:  Patient will identify positive stress management techniques. Patient will identify benefits of using stress management post d/c.  Behavioral Response: Active, Engaged  Intervention: Relaxation, Therapeutic Coloring  Activity: Mandalas and Music. Patients were provided the choice of various mandala and positive mantra coloring pages to reflect on during the group session. Patient used colored pencils to create their own patterns and practice artistic expression. LRT used a calming playlist of instrumental music and spa sounds to promote mindfulness and relaxation.  Education:  Stress Management, Discharge Planning.   Education Outcome: Acknowledges Education  Clinical Observations/Feedback: Pt warmly greeted staff upon entering dayroom. Pt was pleasant and interactive throughout group session. Pt selected coloring page with positive mantra reading "Life is beautiful". Pt endorsed positive experience as a result of engagement in offered activity. Pt stated they looked forward to finishing their coloring sheet later.   Ilsa Iha, LRT/CTRS Benito Mccreedy Jerrid Forgette 03/26/2020, 12:47 PM
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
## 2020-03-26 NOTE — Progress Notes (Cosign Needed)
Adult Psychoeducational Group Note  Date:  03/26/2020 Time:  5:54 PM  Group Topic/Focus:  Goals Group:   The focus of this group is to help patients establish daily goals to achieve during treatment and discuss how the patient can incorporate goal setting into their daily lives to aide in recovery.  Participation Level:  Active  Participation Quality:  Appropriate  Affect:  Appropriate  Cognitive:  Alert  Insight: Appropriate  Engagement in Group:  Engaged  Modes of Intervention:  Discussion  Additional Comments:  Pt attended group and participated in discussion.  Caren R Crotts 03/26/2020, 5:54 PM

## 2020-03-26 NOTE — Plan of Care (Signed)
Nurse discussed anxiety, depression and coping skills with patient.  

## 2020-03-26 NOTE — Tx Team (Signed)
Interdisciplinary Treatment and Diagnostic Plan Update  03/26/2020 Time of Session: 9:30am [name] MRN: [id number]  Principal Diagnosis: Bipolar affective disorder, current episode manic ([place])  Secondary Diagnoses: Principal Problem:   Bipolar affective disorder, current episode manic ([place]) Active Problems:   Generalized anxiety disorder   Substance use disorder   PTSD (post-traumatic stress disorder)   Substance induced mood disorder ([place])   Current Medications:  Current Facility-Administered Medications  Medication Dose Route Frequency Provider Last Rate Last Admin  . acetaminophen (TYLENOL) tablet 650 mg  650 mg Oral Q6H PRN Nelda Marseille, Amy E, MD      . albuterol (VENTOLIN HFA) 108 (90 Base) MCG/ACT inhaler 1-2 puff  1-2 puff Inhalation Q6H PRN Nelda Marseille, Amy E, MD      . alum & mag hydroxide-simeth (MAALOX/MYLANTA) 200-200-20 MG/5ML suspension 30 mL  30 mL Oral Q4H PRN Nelda Marseille, Amy E, MD      . ARIPiprazole (ABILIFY) tablet 10 mg  10 mg Oral Daily Viann Fish E, MD   10 mg at 03/26/20 0810  . busPIRone (BUSPAR) tablet 10 mg  10 mg Oral BID Harlow Asa, MD   10 mg at 03/26/20 0810  . escitalopram (LEXAPRO) tablet 10 mg  10 mg Oral Daily Nelda Marseille, Amy E, MD   10 mg at 03/26/20 0810  . hydrOXYzine (ATARAX/VISTARIL) tablet 25 mg  25 mg Oral Q6H PRN Harlow Asa, MD   25 mg at 03/25/20 1107  . lamoTRIgine (LAMICTAL) tablet 25 mg  25 mg Oral Daily Harlow Asa, MD   25 mg at 03/26/20 0810  . loperamide (IMODIUM) capsule 2-4 mg  2-4 mg Oral PRN Nelda Marseille, Amy E, MD      . loratadine (CLARITIN) tablet 10 mg  10 mg Oral Daily Harlow Asa, MD   10 mg at 03/26/20 0810  . LORazepam (ATIVAN) tablet 1 mg  1 mg Oral Q6H PRN Nelda Marseille, Amy E, MD      . magnesium hydroxide (MILK OF MAGNESIA) suspension 30 mL  30 mL Oral Daily PRN Nelda Marseille, Amy E, MD      . multivitamin with minerals tablet 1 tablet  1 tablet Oral Daily Harlow Asa, MD   1 tablet at 03/26/20 0810  .  nicotine (NICODERM CQ - dosed in mg/24 hours) patch 21 mg  21 mg Transdermal Daily Harlow Asa, MD   21 mg at 03/26/20 0808  . ondansetron (ZOFRAN-ODT) disintegrating tablet 4 mg  4 mg Oral Q6H PRN Harlow Asa, MD   4 mg at 03/26/20 1497  . prazosin (MINIPRESS) capsule 1 mg  1 mg Oral QHS Viann Fish E, MD   1 mg at 03/25/20 2103  . thiamine tablet 100 mg  100 mg Oral Daily Nelda Marseille, Amy E, MD   100 mg at 03/26/20 0810  . traZODone (DESYREL) tablet 300 mg  300 mg Oral QHS Arthor Captain, MD       PTA Medications: Medications Prior to Admission  Medication Sig Dispense Refill Last Dose  . albuterol (VENTOLIN HFA) 108 (90 Base) MCG/ACT inhaler Inhale 1 puff into the lungs every 6 (six) hours as needed for wheezing or shortness of breath. 18 g 2   . ARIPiprazole (ABILIFY) 10 MG tablet Take 1 tablet (10 mg total) by mouth daily. 30 tablet 1   . busPIRone (BUSPAR) 15 MG tablet Take 1 tablet (15 mg total) by mouth 2 (two) times daily. 60 tablet 1   . cetirizine (ZYRTEC) 10 MG  tablet Take 1 tablet (10 mg total) by mouth daily. 30 tablet 11   . fluticasone (FLONASE) 50 MCG/ACT nasal spray Place 1 spray into both nostrils daily. 16 g 2   . hydrOXYzine (ATARAX/VISTARIL) 25 MG tablet Take 1 tablet (25 mg total) by mouth 3 (three) times daily as needed for anxiety. 60 tablet 1   . lamoTRIgine (LAMICTAL) 25 MG tablet Take 1 tablet (25 mg total) by mouth every morning. 30 tablet 1   . prazosin (MINIPRESS) 1 MG capsule Take 1 capsule (1 mg total) by mouth at bedtime. 30 capsule 1   . traZODone (DESYREL) 100 MG tablet Take 4 tablets (400 mg total) by mouth at bedtime as needed for sleep. 120 tablet 1   . escitalopram (LEXAPRO) 10 MG tablet Take 1 tablet (10 mg total) by mouth daily. (Patient not taking: No sig reported) 30 tablet 1 Not Taking at Unknown time    Patient Stressors: Loss of grandmother died 2 weeks ago Medication change or noncompliance Substance abuse  Patient Strengths:  Average or above average intelligence Motivation for treatment/growth Supportive family/friends  Treatment Modalities: Medication Management, Group therapy, Case management,  1 to 1 session with clinician, Psychoeducation, Recreational therapy.   Physician Treatment Plan for Primary Diagnosis: Bipolar affective disorder, current episode manic ([place]) Long Term Goal(s): Improvement in symptoms so as ready for discharge Improvement in symptoms so as ready for discharge   Short Term Goals: Ability to identify changes in lifestyle to reduce recurrence of condition will improve Ability to verbalize feelings will improve Ability to disclose and discuss suicidal ideas Ability to identify and develop effective coping behaviors will improve Ability to identify changes in lifestyle to reduce recurrence of condition will improve Ability to identify and develop effective coping behaviors will improve Ability to maintain clinical measurements within normal limits will improve Compliance with prescribed medications will improve Ability to identify triggers associated with substance abuse/mental health issues will improve  Medication Management: Evaluate patient's response, side effects, and tolerance of medication regimen.  Therapeutic Interventions: 1 to 1 sessions, Unit Group sessions and Medication administration.  Evaluation of Outcomes: Not Met  Physician Treatment Plan for Secondary Diagnosis: Principal Problem:   Bipolar affective disorder, current episode manic ([place]) Active Problems:   Generalized anxiety disorder   Substance use disorder   PTSD (post-traumatic stress disorder)   Substance induced mood disorder ([place])  Long Term Goal(s): Improvement in symptoms so as ready for discharge Improvement in symptoms so as ready for discharge   Short Term Goals: Ability to identify changes in lifestyle to reduce recurrence of condition will improve Ability to verbalize feelings will  improve Ability to disclose and discuss suicidal ideas Ability to identify and develop effective coping behaviors will improve Ability to identify changes in lifestyle to reduce recurrence of condition will improve Ability to identify and develop effective coping behaviors will improve Ability to maintain clinical measurements within normal limits will improve Compliance with prescribed medications will improve Ability to identify triggers associated with substance abuse/mental health issues will improve     Medication Management: Evaluate patient's response, side effects, and tolerance of medication regimen.  Therapeutic Interventions: 1 to 1 sessions, Unit Group sessions and Medication administration.  Evaluation of Outcomes: Not Met   RN Treatment Plan for Primary Diagnosis: Bipolar affective disorder, current episode manic ([place]) Long Term Goal(s): Knowledge of disease and therapeutic regimen to maintain health will improve  Short Term Goals: Ability to participate in decision making will improve, Ability to  verbalize feelings will improve and Ability to disclose and discuss suicidal ideas  Medication Management: RN will administer medications as ordered by provider, will assess and evaluate patient's response and provide education to patient for prescribed medication. RN will report any adverse and/or side effects to prescribing provider.  Therapeutic Interventions: 1 on 1 counseling sessions, Psychoeducation, Medication administration, Evaluate responses to treatment, Monitor vital signs and CBGs as ordered, Perform/monitor CIWA, COWS, AIMS and Fall Risk screenings as ordered, Perform wound care treatments as ordered.  Evaluation of Outcomes: Not Met   LCSW Treatment Plan for Primary Diagnosis: Bipolar affective disorder, current episode manic ([place]) Long Term Goal(s): Safe transition to appropriate next level of care at discharge, Engage patient in therapeutic group addressing  interpersonal concerns.  Short Term Goals: Engage patient in aftercare planning with referrals and resources, Increase social support and Increase ability to appropriately verbalize feelings  Therapeutic Interventions: Assess for all discharge needs, 1 to 1 time with Social worker, Explore available resources and support systems, Assess for adequacy in community support network, Educate family and significant other(s) on suicide prevention, Complete Psychosocial Assessment, Interpersonal group therapy.  Evaluation of Outcomes: Not Met   Progress in Treatment: Attending groups: Yes. Participating in groups: Yes. Taking medication as prescribed: Yes. Toleration medication: Yes. Family/Significant other contact made: Yes, individual(s) contacted:  wife Patient understands diagnosis: Yes. Discussing patient identified problems/goals with staff: Yes. Medical problems stabilized or resolved: Yes. Denies suicidal/homicidal ideation: Yes. Issues/concerns per patient self-inventory: Yes. Other: None  New problem(s) identified: No, Describe:  CSW will continue to assess  New Short Term/Long Term Goal(s): medication stabilization, elimination of SI thoughts, development of comprehensive mental wellness plan.  Patient Goals:  "to be sober, medicated and clear minded   Discharge Plan or Barriers: Patient recently admitted. CSW will continue to follow and assess for appropriate referrals and possible discharge planning.  Reason for Continuation of Hospitalization: Depression Medication stabilization Suicidal ideation  Estimated Length of Stay: 3-5 days  Attendees: Patient: [name] 03/26/2020   Physician: Myles Lipps, MD 03/26/2020   Nursing:  03/26/2020   RN Care Manager: 03/26/2020   Social Worker: Toney Reil, [place] 03/26/2020   Recreational Therapist:  03/26/2020   Other:  03/26/2020   Other:  03/26/2020   Other: 03/26/2020       Scribe for Treatment Team: Mliss Fritz,  Latanya Presser 03/26/2020 3:27 PM

## 2020-03-27 ENCOUNTER — Ambulatory Visit (HOSPITAL_COMMUNITY): Payer: Self-pay | Admitting: Clinical

## 2020-03-27 LAB — GC/CHLAMYDIA PROBE AMP (~~LOC~~) NOT AT ARMC
Chlamydia: NEGATIVE
Comment: NEGATIVE
Comment: NORMAL
Neisseria Gonorrhea: NEGATIVE

## 2020-03-27 NOTE — Progress Notes (Signed)
Adult Psychoeducational Group Note  Date:  03/27/2020 Time:  9:56 PM  Group Topic/Focus:  Wrap-Up Group:   The focus of this group is to help patients review their daily goal of treatment and discuss progress on daily workbooks.  Participation Level:  Active  Participation Quality:  Appropriate  Affect:  Appropriate  Cognitive:  Appropriate  Insight: Appropriate  Engagement in Group:  Engaged  Modes of Intervention:  Discussion  Additional Comments:  Patient said her day was 10. Her goal for today was to establish a discharge plan. She achiever her goal. The coping skills singing and song writing. [name] 03/27/2020, 9:56 PM

## 2020-03-27 NOTE — Progress Notes (Signed)
Pt stated she was feeling better due to not being angry and the medications     03/27/20 2100  Psych Admission Type (Psych Patients Only)  Admission Status Voluntary  Psychosocial Assessment  Patient Complaints None  Eye Contact Fair  Facial Expression Flat  Affect Sad  Speech Logical/coherent  Interaction Assertive  Motor Activity Other (Comment) (WDL)  Appearance/Hygiene Unremarkable  Behavior Characteristics Cooperative  Mood Depressed  Thought Process  Coherency WDL  Content WDL  Delusions None reported or observed  Perception WDL  Hallucination None reported or observed  Judgment Poor  Confusion None  Danger to Self  Current suicidal ideation? Denies  Self-Injurious Behavior No self-injurious ideation or behavior indicators observed or expressed   Agreement Not to Harm Self Yes  Description of Agreement verbal contract  Danger to Others  Danger to Others None reported or observed

## 2020-03-27 NOTE — BHH Counselor (Addendum)
CSW contacted pt's grandmother to confirm that she could discharge there at the time of her release. Pt's grandmother, Zamiyah Resendes, [phone] stated that pt could discharge to her home at the time of discharge. CSW informed Ms. Stickles that the doctor's may discharge the pt tomorrow but that discharge dates are tentative and are subject to change. Ms. Scantling stated she understood and voiced no safety concerns.   CSW contacted pt's wife Hulan Fess [phone] as she received a message that she would like the CSW to call her as she has questions. Ms. Denver Faster asked when the pt would be discharged from [facility]. CSW informed Ms. Pittard that the doctor's may discharge the pt tomorrow but that discharge dates are tentative and are subject to change. Ms. Denver Faster stated she understood and voiced no safety concerns.   CSW informed pt of this information.    Fredirick Lathe, LCSWA Clinicial Social Worker [employer]

## 2020-03-27 NOTE — BHH Counselor (Signed)
Pt approached CSW and informed her that she plans to discharge to her grandparents home due to it being a sober environment. CSW had pt sign consent for pt's grandmother. CSW shared that she would contact her grandmother and confirm this plan. Pt ask that CSW contact her wife with discharge plans. CSW agreed.   Fredirick Lathe, LCSWA Clinicial Social Worker [employer]

## 2020-03-27 NOTE — Progress Notes (Signed)
D: Patient presents with sad affect but reports having a good day. Patient seems to interact well with other patients in the milieu. Patient denies SI/HI at this time. Patient is still positive for AH/VH but reports they are not the same violent impulsive thoughts she was having. Patient states they are the "same people I have seen and heard since I was little." Patient contracts for safety.  A: Provided positive reinforcement and encouragement.  R: Patient cooperative and receptive to efforts. Patient remains safe on the unit.   03/26/20 2127  Psych Admission Type (Psych Patients Only)  Admission Status Voluntary  Psychosocial Assessment  Patient Complaints None  Eye Contact Fair  Facial Expression Flat  Affect Sad  Speech Logical/coherent  Interaction Assertive  Motor Activity Other (Comment) (WDL)  Appearance/Hygiene Unremarkable  Behavior Characteristics Cooperative;Appropriate to situation  Mood Depressed  Thought Process  Coherency WDL  Content WDL  Delusions None reported or observed  Perception WDL  Hallucination None reported or observed  Judgment Poor  Confusion None  Danger to Self  Current suicidal ideation? Denies  Self-Injurious Behavior No self-injurious ideation or behavior indicators observed or expressed   Agreement Not to Harm Self Yes  Description of Agreement verbal contract  Danger to Others  Danger to Others None reported or observed

## 2020-03-27 NOTE — Progress Notes (Signed)
Patient return to the dayroom after talking to her wife on the phone. Kevonna stated in the dayroom, if she is not discharge tomorrow she will blast this place out Aneli made this statement more than once. One of the other patient's told Kamrin you may not want to do that because they may give you more medication that you do not want. RN notify

## 2020-03-27 NOTE — Progress Notes (Signed)
   03/27/20 0636  Vital Signs  Pulse Rate 72  BP 129/70  BP Location Left Arm  BP Method Automatic  Patient Position (if appropriate) Sitting   D: Patient denies SI/HI/AVH. Patient rated anxiety 2/10 and denied depression. Patient out in open areas and was social with peers and staff. Pt. Reported that she wants to leave. A:  Patient took scheduled medicine.  Support and encouragement provided Routine safety checks conducted every 15 minutes. Patient  Informed to notify staff with any concerns.   R: Safety maintained.

## 2020-03-27 NOTE — Progress Notes (Signed)
[facility] MD Progress Note  03/27/2020 12:41 PM [name]  MRN:  [id number] Subjective: Patient was seen and interviewed by MD.  Chart reviewed.  Patient was discussed in detail with members of the treatment team during treatment team rounds.  The patient is a 20 year old female with a history of bipolar disorder, substance use disorder, generalized anxiety disorder, posttraumatic stress disorder and substance-induced mood disorder who is admitted with worsening depression and suicidal ideation.  On interview today, the patient reports feeling much better.  She describes her mood as good and not depressed.  She denies suicidal ideation intent preparation or plan.  She reports last having any passive wishes not to be alive yesterday 03/26/2020.  She denies assaultive ideation, homicidal ideation, desire to fight.  She denies auditory or visual hallucinations other than the chronic baseline auditory and visual hallucinations of people that she has experienced since childhood.  She has no longer hearing any negative voices telling her to harm herself.  Patient states that these voices were last present yesterday.  Patient denies any delusional thought content or any referential thinking.  The patient denies any symptoms of withdrawal from substances.  The patient reports that her current medication regimen appears to be working well.  She is sleeping well and eating well.  She likes the current dose of trazodone.  She denies any side effects from her medications.  The patient denies any physical problems.  The patient is interested in discharging home soon to stay with her grandparents.  She would like to go to [employer], [facility] and Narcotics Anonymous after discharge.  The patient asks if she can have vitamins and nicotine patch on discharge in addition to her current medications of Abilify, BuSpar, trazodone, Lexapro and prazosin.  Principal Problem: Bipolar affective disorder, current episode manic  (HCC) Diagnosis: Principal Problem:   Bipolar affective disorder, current episode manic (HCC) Active Problems:   Generalized anxiety disorder   Substance use disorder   PTSD (post-traumatic stress disorder)   Substance induced mood disorder (HCC)  Total Time spent with patient: 30 minutes  Past Psychiatric History: Patient has a prior diagnosis of bipolar disorder, generalized anxiety disorder, posttraumatic stress disorder, substance-induced mood disorder and substance use disorder (alcohol, cocaine, ecstasy, THC).  She reports a history of previous suicide attempts.  She also reports a history of chronic history of auditory and visual hallucinations since childhood and chronic paranoid ideation regarding men in general.  She reports onset of substance use approximately 20 years of age.  She follows up at [facility] behavioral health for medication management.  Past Medical History:  Past Medical History:  Diagnosis Date  . Anxiety   . Asthma   . Headache(784.0)   . Hx of suicide attempt   . Major depressive disorder   . Morbid obesity (HCC) 03/25/2020  . PTSD (post-traumatic stress disorder)     Past Surgical History:  Procedure Laterality Date  . wisdom tooth extraction     Family History:  Family History  Problem Relation Age of Onset  . Diabetes Maternal Aunt   . Diabetes Maternal Grandmother   . Cancer Maternal Grandmother   . Breast cancer Maternal Grandmother   . Breast cancer Other    Family Psychiatric  History: None reported Social History:  Social History   Substance and Sexual Activity  Alcohol Use Yes   Comment: been drinking heavily last 2 weeks; a 1/5 of liquor a day     Social History   Substance and  Sexual Activity  Drug Use Yes  . Types: Marijuana, MDMA (Ecstacy), Cocaine   Comment: Coc-last use 2/27; marijuana and MDMA last use 2/28    Social History   Socioeconomic History  . Marital status: Married    Spouse name: Not on file  . Number  of children: 2  . Years of education: Not on file  . Highest education level: Some college, no degree  Occupational History  . Not on file  Tobacco Use  . Smoking status: Current Every Day Smoker    Packs/day: 0.50    Types: Cigarettes, Cigars  . Smokeless tobacco: Never Used  . Tobacco comment: black and mild  Vaping Use  . Vaping Use: Every day  Substance and Sexual Activity  . Alcohol use: Yes    Comment: been drinking heavily last 2 weeks; a 1/5 of liquor a day  . Drug use: Yes    Types: Marijuana, MDMA (Ecstacy), Cocaine    Comment: Coc-last use 2/27; marijuana and MDMA last use 2/28  . Sexual activity: Yes    Partners: Male    Birth control/protection: None, Injection  Other Topics Concern  . Not on file  Social History Narrative   Pt and family are homeless. She, her wife and two kids live in her cousin's house   Social Determinants of Health   Financial Resource Strain: Not on file  Food Insecurity: Not on file  Transportation Needs: Not on file  Physical Activity: Not on file  Stress: Not on file  Social Connections: Not on file   Additional Social History:    Pain Medications: None Prescriptions: See PTA medication listed in EPIC from [employer] [facility] [employer]. Over the Counter: None History of alcohol / drug use?: Yes Withdrawal Symptoms: Patient aware of relationship between substance abuse and physical/medical complications,Agitation,Nausea / Vomiting,Weakness Name of Substance 1: Marijuana 1 - Age of First Use: 20 years of age 61 - Amount (size/oz): 3-6 blunts in a day 1 - Frequency: Daily 1 - Duration: ongoing 1 - Last Use / Amount: 03/24/20  Four blunts 1- Route of Use: Inhalation Name of Substance 2: Cocaine (powder) 2 - Age of First Use: 20 years of age 53 - Amount (size/oz): Varies according to money 2 - Frequency: 3-5 times in a week 2 - Duration: ongoing 2 - Last Use / Amount: Yesterday (02/27) 2 - Route of Substance Use: nasal Name of Substance 3: ETOH 3 - Age  of First Use: 20 years of age 43 - Amount (size/oz): Will drink about three 16 oz hard drinks (hard lemonade, etc) 3 - Frequency: Daily 3 - Duration: on going 3 - Last Use / Amount: Two days ago 3 - Route of Substance Use: drinking Name of Substance 4: Ecstasy 4 - Age of First Use: 20 years of age 39 - Amount (size/oz): 1-3 tablets 4 - Frequency: 2-3 times in a week 4 - Duration: on-going 4 - Last Use / Amount: 03/24/20 around 18:00 one tablet 4 - Route of Substance Use: Oral            Sleep: Good  Appetite:  Good  Current Medications: Current Facility-Administered Medications  Medication Dose Route Frequency Provider Last Rate Last Admin  . acetaminophen (TYLENOL) tablet 650 mg  650 mg Oral Q6H PRN Singleton, Amy E, MD      . albuterol (VENTOLIN HFA) 108 (90 Base) MCG/ACT inhaler 1-2 puff  1-2 puff Inhalation Q6H PRN Comer Locket, MD      . alum &  mag hydroxide-simeth (MAALOX/MYLANTA) 200-200-20 MG/5ML suspension 30 mL  30 mL Oral Q4H PRN Mason JimSingleton, Amy E, MD      . ARIPiprazole (ABILIFY) tablet 10 mg  10 mg Oral Daily Mason JimSingleton, Amy E, MD   10 mg at 03/27/20 0757  . busPIRone (BUSPAR) tablet 10 mg  10 mg Oral BID Comer LocketSingleton, Amy E, MD   10 mg at 03/27/20 0756  . escitalopram (LEXAPRO) tablet 10 mg  10 mg Oral Daily Mason JimSingleton, Amy E, MD   10 mg at 03/27/20 0756  . hydrOXYzine (ATARAX/VISTARIL) tablet 25 mg  25 mg Oral Q6H PRN Comer LocketSingleton, Amy E, MD   25 mg at 03/25/20 1107  . lamoTRIgine (LAMICTAL) tablet 25 mg  25 mg Oral Daily Comer LocketSingleton, Amy E, MD   25 mg at 03/27/20 0757  . loperamide (IMODIUM) capsule 2-4 mg  2-4 mg Oral PRN Mason JimSingleton, Amy E, MD      . loratadine (CLARITIN) tablet 10 mg  10 mg Oral Daily Comer LocketSingleton, Amy E, MD   10 mg at 03/27/20 0757  . LORazepam (ATIVAN) tablet 1 mg  1 mg Oral Q6H PRN Mason JimSingleton, Amy E, MD      . magnesium hydroxide (MILK OF MAGNESIA) suspension 30 mL  30 mL Oral Daily PRN Mason JimSingleton, Amy E, MD      . multivitamin with minerals tablet 1  tablet  1 tablet Oral Daily Comer LocketSingleton, Amy E, MD   1 tablet at 03/27/20 0756  . nicotine (NICODERM CQ - dosed in mg/24 hours) patch 21 mg  21 mg Transdermal Daily Mason JimSingleton, Amy E, MD   21 mg at 03/27/20 0757  . ondansetron (ZOFRAN-ODT) disintegrating tablet 4 mg  4 mg Oral Q6H PRN Comer LocketSingleton, Amy E, MD   4 mg at 03/26/20 16100814  . prazosin (MINIPRESS) capsule 1 mg  1 mg Oral QHS Bartholomew CrewsSingleton, Amy E, MD   1 mg at 03/26/20 2127  . thiamine tablet 100 mg  100 mg Oral Daily Mason JimSingleton, Amy E, MD   100 mg at 03/27/20 0756  . traZODone (DESYREL) tablet 300 mg  300 mg Oral QHS Claudie ReveringJames, Emmilee Reamer L, MD   300 mg at 03/26/20 2127    Lab Results: No results found for this or any previous visit (from the past 48 hour(s)).  Blood Alcohol level:  Lab Results  Component Value Date   ETH <10 03/25/2020   ETH <10 12/28/2018    Metabolic Disorder Labs: Lab Results  Component Value Date   HGBA1C 5.4 03/25/2020   MPG 108.28 03/25/2020   MPG 111 05/22/2019   Lab Results  Component Value Date   PROLACTIN 6.6 12/28/2018   PROLACTIN 5.8 08/23/2017   Lab Results  Component Value Date   CHOL 152 03/25/2020   TRIG 41 03/25/2020   HDL 50 03/25/2020   CHOLHDL 3.0 03/25/2020   VLDL 8 03/25/2020   LDLCALC 94 03/25/2020   LDLCALC 116 (H) 12/28/2018    Physical Findings: AIMS:  , ,  ,  ,    CIWA:  CIWA-Ar Total: 1 COWS:     Musculoskeletal: Strength & Muscle Tone: within normal limits Gait & Station: normal Patient leans: N/A  Psychiatric Specialty Exam: Physical Exam Vitals and nursing note reviewed.  HENT:     Head: Normocephalic and atraumatic.  Pulmonary:     Effort: Pulmonary effort is normal.  Musculoskeletal:        General: Normal range of motion.     Cervical back: Normal range of motion.  Neurological:     General: No focal deficit present.     Mental Status: She is alert and oriented to person, place, and time.  Psychiatric:        Attention and Perception: Attention normal.         Mood and Affect: Mood is not depressed. Affect is not labile.        Speech: Speech normal.        Behavior: Behavior is cooperative.        Thought Content: Thought content is not delusional. Thought content does not include homicidal or suicidal ideation.        Cognition and Memory: Cognition normal.        Judgment: Judgment normal.     Review of Systems  Blood pressure 101/67, pulse (!) 122, temperature 97.9 F (36.6 C), temperature source Oral, resp. rate 18, height 5\' 9"  (1.753 m), weight 112.5 kg, last menstrual period 03/03/2020, SpO2 100 %.Body mass index is 36.62 kg/m.  General Appearance: Casual, Neat and Well Groomed  Eye Contact:  Good  Speech:  Normal Rate.  Clear and coherent  Volume:  Normal  Mood:  Euthymic  Affect:  NA and Congruent  Thought Process:  Coherent and Linear  Orientation:  Full (Time, Place, and Person)  Thought Content:  Logical and No referential thinking or delusional content.  Patient continues with chronic baseline auditory and visual hallucinations of people that she has had since childhood.  Suicidal Thoughts:  No  Homicidal Thoughts:  No  Memory:  Immediate;   Fair Recent;   Fair Remote;   Fair  Judgement:  Fair  Insight:  Fair  Psychomotor Activity:  Normal  Concentration:  Concentration: Good  Recall:  Good  Fund of Knowledge:  Fair  Language:  Good  Akathisia:  No  Handed:  Right  AIMS (if indicated):     Assets:  Communication Skills Desire for Improvement Housing Intimacy Leisure Time Physical Health Social Support Talents/Skills  ADL's:  Intact  Cognition:  WNL  Sleep:  Number of Hours: 6.25     Treatment Plan Summary: Daily contact with patient to assess and evaluate symptoms and progress in treatment  Continue every 15-minute observation status for patient safety  And management Bipolar disorder  -Continue Abilify 10 mg daily for mood stabilization and psychotic symptoms  -Continue lamotrigine 25 mg daily for  mood stabilization  -Continue Lexapro 10 mg daily for mood and anxiety symptoms  -Continue buspirone 10 mg twice daily for mood and anxiety symptoms  Insomnia  -Continue trazodone 300 mg at bedtime for insomnia  -Continue prazosin 1 mg at bedtime for nightmares  Substance use disorder  -Continues on CIWA protocol.  Anticipate discontinuation of CIWA protocol on 03/28/2020  -Patient has been advised to participate in residential substance use disorder treatment program or intensive outpatient substance use disorder treatment program after discharge  Social work is working on aftercare plans including mental health follow-up and substance use disorder treatment program.  Possible discharge on 03/28/2020  Encourage patient to participate in CBT, recreational therapy, group therapy and milieu therapy while in the hospital.  05/28/2020, MD 03/27/2020, 12:41 PM

## 2020-03-28 DIAGNOSIS — F3113 Bipolar disorder, current episode manic without psychotic features, severe: Secondary | ICD-10-CM

## 2020-03-28 MED ORDER — LAMOTRIGINE 25 MG PO TABS: 25.0000 mg | ORAL_TABLET | Freq: Every day | ORAL | 0 refills | Status: DC

## 2020-03-28 MED ORDER — TRAZODONE HCL 300 MG PO TABS: 300.0000 mg | ORAL_TABLET | Freq: Every day | ORAL | 0 refills | Status: DC

## 2020-03-28 MED ORDER — ESCITALOPRAM OXALATE 10 MG PO TABS: 10.0000 mg | ORAL_TABLET | Freq: Every day | ORAL | 0 refills | Status: DC

## 2020-03-28 MED ORDER — PRAZOSIN HCL 1 MG PO CAPS: 1.0000 mg | ORAL_CAPSULE | Freq: Every day | ORAL | 0 refills | Status: DC

## 2020-03-28 MED ORDER — BUSPIRONE HCL 10 MG PO TABS: 10.0000 mg | ORAL_TABLET | Freq: Two times a day (BID) | ORAL | 0 refills | Status: DC

## 2020-03-28 MED ORDER — NICOTINE 21 MG/24HR TD PT24: 21.0000 mg | MEDICATED_PATCH | Freq: Every day | TRANSDERMAL | 0 refills | Status: DC

## 2020-03-28 MED ORDER — WHITE PETROLATUM EX OINT
TOPICAL_OINTMENT | CUTANEOUS | Status: AC
  Filled 2020-03-28: qty 5

## 2020-03-28 MED ORDER — ARIPIPRAZOLE 10 MG PO TABS: 10.0000 mg | ORAL_TABLET | Freq: Every day | ORAL | 0 refills | Status: DC

## 2020-03-28 NOTE — BHH Suicide Risk Assessment (Signed)
[facility] Discharge Suicide Risk Assessment   Principal Problem: Bipolar affective disorder, current episode manic [facility]) Discharge Diagnoses: Principal Problem:   Bipolar affective disorder, current episode manic (HCC) Active Problems:   Generalized anxiety disorder   Substance use disorder   PTSD (post-traumatic stress disorder)   Substance induced mood disorder (HCC)   Total Time spent with patient: 20 minutes  Musculoskeletal: Strength & Muscle Tone: within normal limits Gait & Station: normal Patient leans: N/A  Psychiatric Specialty Exam: Review of Systems  Constitutional: Negative for chills, diaphoresis and fever.  HENT: Negative for congestion.   Respiratory: Negative for wheezing.   Cardiovascular: Negative for chest pain.  Gastrointestinal: Negative for constipation, diarrhea and vomiting.  Genitourinary: Negative for difficulty urinating.  Musculoskeletal: Negative for back pain.  Neurological: Negative for headaches.  Psychiatric/Behavioral: Positive for hallucinations. Negative for self-injury, sleep disturbance and suicidal ideas.       Chronic hallucinations that are not command    Blood pressure 120/87, pulse (!) 106, temperature 98 F (36.7 C), temperature source Oral, resp. rate 18, height 5\' 9"  (1.753 m), weight 112.5 kg, last menstrual period 03/03/2020, SpO2 100 %.Body mass index is 36.62 kg/m.  General Appearance: Casual and Well Groomed  Eye Contact::  Good  Speech:  Clear and Coherent and Normal Rate409  Volume:  Normal  Mood:  Euthymic  Affect:  Appropriate and Congruent  Thought Process:  Coherent, Goal Directed and Linear  Orientation:  Full (Time, Place, and Person)  Thought Content:  Logical  No delusional content  Suicidal Thoughts:  No  Homicidal Thoughts:  No  Memory:  Immediate;   Good Recent;   Good Remote;   Good  Judgement:  Good  Insight:  Good  Psychomotor Activity:  Normal  Concentration:  Good  Recall:  Good  Fund of  Knowledge:Good  Language: Good  Akathisia:  No  Handed:  Right  AIMS (if indicated):    AIMS Score = 0  Assets:  Communication Skills Desire for Improvement Housing Intimacy Leisure Time Physical Health Resilience Social Support Talents/Skills Transportation  Sleep:  Number of Hours: 5.25  Cognition: WNL  ADL's:  Intact   Mental Status Per Nursing Assessment::   On Admission:  Suicidal ideation indicated by patient,Thoughts of violence towards others,Suicide plan  Demographic Factors:  Adolescent or young adult  Loss Factors: NA  Historical Factors: Prior suicide attempts  Risk Reduction Factors:   Responsible for children under 21 years of age, Sense of responsibility to family, Living with another person, especially a relative, Positive social support and Positive coping skills or problem solving skills  Continued Clinical Symptoms:  Previous Psychiatric Diagnoses and Treatments  Cognitive Features That Contribute To Risk:  None    Suicide Risk:  Minimal: No identifiable suicidal ideation.  Patients presenting with no risk factors but with morbid ruminations; may be classified as minimal risk based on the severity of the depressive symptoms   Follow-up Information    [facility]. Go on 04/02/2020.   Specialty: Behavioral Health Why: You have a walk in appt on 04/02/20 at 7:45 am for therapy.  You also have an appointment for medication management on 05/06/20 at 3:30 pm for medication management.   A referral has been made for the partial hospitalization program (provider will contact). Contact information: [phone] [place] [phone]              Plan Of Care/Follow-up recommendations:   Activity:  as tolerated  Other:  stop using alcohol and illegal substances.  Follow up with psychiatric and counseling appointments and participate substance use disorder treatment and support groups.  Claudie Revering,  MD 03/28/2020, 10:03 AM

## 2020-03-28 NOTE — Progress Notes (Signed)
Pt discharged to lobby.Grandmother present to pick up patient. Pt was stable and appreciative at that time. All papers and prescriptions were given and valuables returned. Verbal understanding expressed. Denies SI/HI and A/VH. Pt given opportunity to express concerns and ask questions.

## 2020-03-28 NOTE — Progress Notes (Signed)
Follow up with pt for completion of advance directive.    Vasti states she had not been able to complete documents while at [facility].  She expresses understanding that she can take to an outside notary.    Chaplain informed of availability to assist with completion of documents while at [facility].

## 2020-03-28 NOTE — Progress Notes (Addendum)
   03/28/20 0900  Psych Admission Type (Psych Patients Only)  Admission Status Voluntary  Psychosocial Assessment  Patient Complaints None  Eye Contact Fair  Facial Expression Animated  Affect Appropriate to circumstance  Speech Logical/coherent  Interaction Assertive  Motor Activity Other (Comment) (WDL)  Appearance/Hygiene Unremarkable  Behavior Characteristics Cooperative;Appropriate to situation  Thought Process  Coherency WDL  Content WDL  Delusions None reported or observed  Perception WDL  Hallucination None reported or observed  Judgment Poor  Confusion None  Danger to Self  Current suicidal ideation? Denies  Self-Injurious Behavior No self-injurious ideation or behavior indicators observed or expressed   Agreement Not to Harm Self Yes  Description of Agreement verbal contract  Danger to Others  Danger to Others None reported or observed    D. Pt presents as friendly- smiles upon approach- exhibits calm, cooperative behavior- pt observed in the milieu interacting appropriately with peers and staff.  Pt currently denies SI/HI and AVH Pt reported that she is excited to leave, and discussed her plans to utilize her supports upon discharge.  A. Labs and vitals monitored. Pt given and educated on medications. Pt supported emotionally and encouraged to express concerns and ask questions.   R. Pt remains safe with 15 minute checks. Will continue POC.

## 2020-03-28 NOTE — Progress Notes (Signed)
Provided support around loss of grandmother (nana).   Chaplain engaged with [employer] in remembrance of grandmother, normalizing grief responses, identifying values and moments of resilience she holds from relationship with [place].   Kynlee spoke about her graduation from school, motivation to raise her son, and was grateful that her 22y/o son was able to know her [place].

## 2020-03-28 NOTE — BHH Group Notes (Signed)
Adult Psychoeducational Group Note  Date:  03/28/2020 Time:  10:20 AM  Group Topic/Focus:  Goals Group:   The focus of this group is to help patients establish daily goals to achieve during treatment and discuss how the patient can incorporate goal setting into their daily lives to aide in recovery.  Participation Level:  Active  Participation Quality:  Appropriate  Affect:  Appropriate  Cognitive:  Appropriate  Insight: Appropriate  Engagement in Group:  Engaged  Modes of Intervention:  Discussion  Additional Comments:  Patient attended group and participated.   [name] 03/28/2020, 10:20 AM

## 2020-03-28 NOTE — Progress Notes (Signed)
  [facility] Adult Case Management Discharge Plan :  Will you be returning to the same living situation after discharge:  Yes,  Grandparents Home  At discharge, do you have transportation home?: Yes,  Grandmother Do you have the ability to pay for your medications: Yes,  Medicaid  Release of information consent forms completed and in the chart;  Patient's signature needed at discharge.  Patient to Follow up at:  Follow-up Information    [facility]. Go on 04/02/2020.   Specialty: Behavioral Health Why: You have a walk in appt on 04/02/20 at 7:45 am for therapy.  You also have an appointment for medication management on 05/06/20 at 3:30 pm for medication management.   A referral has been made for the partial hospitalization program (provider will contact). Contact information: [address] [place] [address] [phone]              Next level of care provider has access to [facility] Link:yes  Safety Planning and Suicide Prevention discussed: Yes,  with patient and wife  Have you used any form of tobacco in the last 30 days? (Cigarettes, Smokeless Tobacco, Cigars, and/or Pipes): Yes  Has patient been referred to the Quitline?: Patient refused referral  Patient has been referred for addiction treatment: Yes  Aram Beecham, LCSWA 03/28/2020, 10:23 AM

## 2020-03-28 NOTE — Discharge Summary (Signed)
Physician Discharge Summary Note  Patient:  [name] is an 20 y.o., female  MRN:  [id number]  DOB:  [birth year]  Patient phone:  [phone] (home)   Patient address:   [address] [place] [address],   Total Time spent with patient: Greater than 30 minutes  Date of Admission:  03/24/2020  Date of Discharge: 03-28-20  Reason for Admission: Homicidal/suicidal thoughts with plans to overdose/worsening drug use.  Principal Problem: Bipolar affective disorder, current episode manic [facility])  Discharge Diagnoses: Patient Active Problem List   Diagnosis Date Noted  . Irritable bowel syndrome [K58.9] 03/25/2020  . Morbid obesity (HCC) [E66.01] 03/25/2020  . Substance induced mood disorder (HCC) [F19.94] 03/24/2020  . Asthma due to seasonal allergies [J45.909] 03/13/2020  . Seasonal allergies [J30.2] 03/13/2020  . Tobacco abuse [Z72.0] 03/13/2020  . Homelessness [Z59.00] 03/13/2020  . Family history of breast cancer [Z80.3] 03/13/2020  . ASCUS with positive high risk HPV cervical [A63.016, R87.810] 03/13/2020  . Bipolar affective disorder, current episode manic (HCC) [F31.10] 01/24/2020  . 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 Substance use disorder [F19.90] 06/08/2017  . Elevated blood pressure reading [R03.0] 04/13/2017  . 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 Hx of suicide attempt [Z91.51] 03/19/2016  . PTSD (post-traumatic stress disorder) [F43.10] 03/19/2016  . Generalized anxiety disorder [F41.1]   . Suicidal ideation [R45.851]    Past Psychiatric History: See H&P  Past Medical History:  Past Medical History:  Diagnosis Date  . Anxiety   . Asthma   . Headache(784.0)   . Hx of suicide attempt   . Major depressive disorder   . Morbid obesity (HCC) 03/25/2020  . PTSD (post-traumatic stress disorder)     Past Surgical History:  Procedure Laterality Date  . wisdom tooth extraction      Family History:  Family History  Problem  Relation Age of Onset  . Diabetes Maternal Aunt   . Diabetes Maternal Grandmother   . Cancer Maternal Grandmother   . Breast cancer Maternal Grandmother   . Breast cancer Other    Family Psychiatric  History: See H&P  Social History:  Social History   Substance and Sexual Activity  Alcohol Use Yes   Comment: been drinking heavily last 2 weeks; a 1/5 of liquor a day     Social History   Substance and Sexual Activity  Drug Use Yes  . Types: Marijuana, MDMA (Ecstacy), Cocaine   Comment: Coc-last use 2/27; marijuana and MDMA last use 2/28    Social History   Socioeconomic History  . Marital status: Married    Spouse name: Not on file  . Number of children: 2  . Years of education: Not on file  . Highest education level: Some college, no degree  Occupational History  . Not on file  Tobacco Use  . Smoking status: Current Every Day Smoker    Packs/day: 0.50    Types: Cigarettes, Cigars  . Smokeless tobacco: Never Used  . Tobacco comment: black and mild  Vaping Use  . Vaping Use: Every day  Substance and Sexual Activity  . Alcohol use: Yes    Comment: been drinking heavily last 2 weeks; a 1/5 of liquor a day  . Drug use: Yes    Types: Marijuana, MDMA (Ecstacy), Cocaine    Comment: Coc-last use 2/27; marijuana and MDMA last use 2/28  . Sexual activity: Yes    Partners: Male    Birth control/protection: None, Injection  Other  Topics Concern  . Not on file  Social History Narrative   Pt and family are homeless. She, her wife and two kids live in her cousin's house   Social Determinants of Health   Financial Resource Strain: Not on file  Food Insecurity: Not on file  Transportation Needs: Not on file  Physical Activity: Not on file  Stress: Not on file  Social Connections: Not on file   Hospital Course: (Per Md's admission notes): Patient is a 20 year old who presented as a walk-in to behavioral health hospital with having thoughts of killing herself by overdosing.   Patient also reports that she has history of previous suicide attempts, was using alcohol, marijuana, ecstasy and cocaine.  Patient on initial presentation reported some homicidal ideation but would not elaborate and also reported hearing voices telling her to be evaluated reporting bad thoughts in her head.This morning, patient reports that she has been struggling with her mood, has a lot of ups and downs, adds that she lost her [name] 2 weeks ago and since then she has been on a downward spiral in regards to her addiction.  Patient states that she was so frustrated with her substance use, affective instability the edition she decided it would be better if she ended her life.  She does report she has supports which include her partner, wants to be there for her child and also states that her grandparents are very supportive.  This is one of several psychiatric discharge summaries from this [facility] for this 20 year old AA female with hx of chronic depression, polysubstance use disorder & obsessive thoughts of suicide. She has been a patient in this [facility] previously/numerous times as as adolescent as an adult. At the time of her admission as an adolescent, she received treatment for depression & PTSD. This is confirmed by the chart review that indicated that she has been in & out of psychiatric hospitals since her childhood. She has been tried on several psychotropic medications without success & most of the time will intentionally stop taking her medications. She was admitted to the [facility] this time around with complaints of homicidal/suicidal thoughts with plans to overdose & worsening drug use.   After the above admission assessment, [name]'s presenting symptoms were identified. She was recommended for mood stabilization treatments The medication regimen for her presenting symptoms were discussed & initiated with her consent. She was medicated, stabilized & discharged on the medications as listed below on her discharge  medication lists. She was enrolled & participated in the group counseling sessions being offered & held on this unit. She learned coping skills. She was resumed & discharged on all her pertinent home medications for her other pre-existing medical issues presented. She tolerated her treatment regimen without any adverse effects or reactions reported.  Suesan's symptoms responded well to her treatment regimen warranting this discharge. This is evidenced by her reports of improved mood & absence of suicidal/homicidal ideations, plans or intent.  Madalee currently presents mentally & medically stable to be discharged to continue mental health care & medication management on an outpatient as noted below. At this time of her hospital discharge, Geniece is alert, attentive, well related, pleasant, mood improved & currently presents euthymic. Her affect is appropriate & positively reactive, no thought disorder noted, no suicidal or self injurious ideations reported, no homicidal or violent ideations present, no hallucinations, no delusions, not internally preoccupied. She is future oriented. Her behavior on the unit was calm & in good control. She denies any  medication side effects, which we reviewed with her. She will continue further mental health care & medication management on an outpatient basis as noted below. She is provided with all the necessary information needed to make this appointment without problems. Ivana was able to engage in safety planning including plan to return to [facility] or contact emergency services if she feels unable to maintain her own safety or the safety of others. Pt had no further questions, comments or concerns.  She left [facility] in no apparent distress with all personal belongings. Transportation per grand-mother.   Physical Findings: AIMS:  , ,  ,  ,    CIWA:  CIWA-Ar Total: 0 COWS:     Musculoskeletal: Strength & Muscle Tone: within normal limits Gait & Station: normal Patient leans:  N/A  Psychiatric Specialty Exam: Physical Exam Vitals and nursing note reviewed.  Constitutional:      Appearance: She is well-developed.  HENT:     Head: Normocephalic.     Nose: Nose normal.     Mouth/Throat:     Pharynx: Oropharynx is clear.  Eyes:     Pupils: Pupils are equal, round, and reactive to light.  Cardiovascular:     Rate and Rhythm: Normal rate.     Pulses: Normal pulses.  Pulmonary:     Effort: Pulmonary effort is normal.  Abdominal:     Palpations: Abdomen is soft.  Genitourinary:    Comments: Deferred Musculoskeletal:        General: Normal range of motion.     Cervical back: Normal range of motion.  Skin:    General: Skin is warm and dry.  Neurological:     General: No focal deficit present.     Mental Status: She is alert and oriented to person, place, and time. Mental status is at baseline.     Review of Systems  Constitutional: Negative for chills, diaphoresis and fever.  HENT: Negative for sore throat.   Eyes: Negative.   Respiratory: Negative for cough, shortness of breath and wheezing.   Cardiovascular: Negative for chest pain and palpitations.  Gastrointestinal: Negative for abdominal pain, diarrhea, heartburn, nausea and vomiting.  Genitourinary: Negative for dysuria.  Musculoskeletal: Negative for joint pain and myalgias.  Skin: Negative for itching and rash.  Neurological: Negative.  Negative for dizziness, tingling, tremors, sensory change, speech change, focal weakness, seizures, loss of consciousness, weakness and headaches.  Endo/Heme/Allergies: Negative.  Negative for environmental allergies. Does not bruise/bleed easily.       Allergies: Tape  Lactose intolerance  Psychiatric/Behavioral: Positive for depression (Stabilized with medication prior to discharge) and substance abuse (Hx. Cocaine & THC use disorder ). Negative for hallucinations, memory loss and suicidal ideas. The patient has insomnia (Stabilized with medication prior to  discharge). The patient is not nervous/anxious ( Stable upon discharge).     Blood pressure 120/87, pulse (!) 106, temperature 98 F (36.7 C), temperature source Oral, resp. rate 18, height 5\' 9"  (1.753 m), weight 112.5 kg, last menstrual period 03/03/2020, SpO2 100 %.Body mass index is 36.62 kg/m.  See Md's SRA   Have you used any form of tobacco in the last 30 days? (Cigarettes, Smokeless Tobacco, Cigars, and/or Pipes): Yes  Has this patient used any form of tobacco in the last 30 days? (Cigarettes, Smokeless Tobacco, Cigars, and/or Pipes): Yes,  an FDA-approved tobacco cessation medication was recommended at discharge.  Blood Alcohol level:  Lab Results  Component Value Date   ETH <10 03/25/2020   ETH <10  12/28/2018   Metabolic Disorder Labs:  Lab Results  Component Value Date   HGBA1C 5.4 03/25/2020   MPG 108.28 03/25/2020   MPG 111 05/22/2019   Lab Results  Component Value Date   PROLACTIN 6.6 12/28/2018   PROLACTIN 5.8 08/23/2017   Lab Results  Component Value Date   CHOL 152 03/25/2020   TRIG 41 03/25/2020   HDL 50 03/25/2020   CHOLHDL 3.0 03/25/2020   VLDL 8 03/25/2020   LDLCALC 94 03/25/2020   LDLCALC 116 (H) 12/28/2018   See Psychiatric Specialty Exam and Suicide Risk Assessment completed by Attending Physician prior to discharge.  Discharge destination:  Home  Is patient on multiple antipsychotic therapies at discharge:  No   Has Patient had three or more failed trials of antipsychotic monotherapy by history:  No  Recommended Plan for Multiple Antipsychotic Therapies: NA  Allergies as of 03/28/2020      Reactions   Lactose Intolerance (gi) Nausea And Vomiting, Other (See Comments)   Tape Other (See Comments)   Slight skin irritation      Medication List    STOP taking these medications   fluticasone 50 MCG/ACT nasal spray Commonly known as: FLONASE   hydrOXYzine 25 MG tablet Commonly known as: ATARAX/VISTARIL     TAKE these medications      Indication  albuterol 108 (90 Base) MCG/ACT inhaler Commonly known as: VENTOLIN HFA Inhale 1 puff into the lungs every 6 (six) hours as needed for wheezing or shortness of breath.  Indication: Asthma   ARIPiprazole 10 MG tablet Commonly known as: ABILIFY Take 1 tablet (10 mg total) by mouth daily. For mood control What changed: additional instructions  Indication: Mood control   busPIRone 10 MG tablet Commonly known as: BUSPAR Take 1 tablet (10 mg total) by mouth 2 (two) times daily. For anxiety What changed:   medication strength  how much to take  additional instructions  Indication: Anxiety Disorder   cetirizine 10 MG tablet Commonly known as: ZYRTEC Take 1 tablet (10 mg total) by mouth daily.  Indication: Perennial Allergic Rhinitis, Hayfever   escitalopram 10 MG tablet Commonly known as: Lexapro Take 1 tablet (10 mg total) by mouth daily. For depression What changed: additional instructions  Indication: Major Depressive Disorder   lamoTRIgine 25 MG tablet Commonly known as: LAMICTAL Take 1 tablet (25 mg total) by mouth daily. For mood stabilization Start taking on: March 29, 2020 What changed:   when to take this  additional instructions  Indication: Mood stabilization   nicotine 21 mg/24hr patch Commonly known as: NICODERM CQ - dosed in mg/24 hours Place 1 patch (21 mg total) onto the skin daily. (May buy from over the counter): For smoking cessation Start taking on: March 29, 2020  Indication: Nicotine Addiction   prazosin 1 MG capsule Commonly known as: MINIPRESS Take 1 capsule (1 mg total) by mouth at bedtime. For nightmares What changed: additional instructions  Indication: Frightening Dreams   trazodone 300 MG tablet Commonly known as: DESYREL Take 1 tablet (300 mg total) by mouth at bedtime. For sleep What changed:   medication strength  how much to take  when to take this  reasons to take this  additional instructions  Indication:  Trouble Sleeping       Follow-up Information    [facility]. Go on 04/02/2020.   Specialty: Behavioral Health Why: You have a walk in appt on 04/02/20 at 7:45 am for therapy.  You also have  an appointment for medication management on 05/06/20 at 3:30 pm for medication management.   A referral has been made for the partial hospitalization program (provider will contact). Contact information: [phone] [place] [phone]             Follow-up recommendations: Activity:  As tolerated Diet: As recommended by your primary care doctor. Keep all scheduled follow-up appointments as recommended.  Comments: Patient is instructed prior to discharge to: Take all medications as prescribed by his/her mental healthcare provider. Report any adverse effects and or reactions from the medicines to his/her outpatient provider promptly. Patient has been instructed & cautioned: To not engage in alcohol and or illegal drug use while on prescription medicines. In the event of worsening symptoms, patient is instructed to call the crisis hotline, 911 and or go to the nearest ED for appropriate evaluation and treatment of symptoms. To follow-up with his/her primary care provider for your other medical issues, concerns and or health care needs.   Signed: Armandina StammerAgnes Eviana Sibilia, NP, PMHNP, FNP-BC 03/28/2020, 10:01 AM

## 2020-03-28 NOTE — Progress Notes (Signed)
Recreation Therapy Notes  Date: 03/28/2020 Time: 9:30a Location: [address] Dayroom  Group Topic: Stress Management  Goal Area(s) Addresses:  Patient will identify positive stress management techniques. Patient will identify benefits of using stress management post d/c.  Behavioral Response: Engaged, Active  Intervention: Worksheet, Group brain storming  Activity :  Mind Map.  Patient was provided a blank template of a diagram with 32 blank boxes in a tiered system, branching from the center (similar to a bubble chart). LRT directed patients to label the middle of the diagram "Stress" and consider 8 different sources of stress in their day to day life. Pt were directed to record their stressors in the 2nd tier boxes closest to the center. Patients were to then come up with 3 effective techniques to address each identified area in the remaining boxes stemming from a particular stressor. Pts were encouraged to share ideas with one another and ask for suggestions of peers and Clinical research associate when stuck on a certain category of stress.  Education:  Stress Management, Discharge Planning.   Education Outcome: Acknowledges Education  Clinical Observations/Feedback: Pt was engaged and asked questions when needed.  Pt shared personal stressors with the group and expressed support of others. Pt was called out to meet with unit staff and did not return.   Nicholos Johns Eilleen Davoli, LRT/CTRS Benito Mccreedy Hebert Dooling 03/28/2020, 1:07 PM

## 2020-03-28 NOTE — Progress Notes (Signed)
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
## 2020-04-02 ENCOUNTER — Ambulatory Visit: Payer: Medicaid Other | Admitting: Family Medicine

## 2020-04-02 DIAGNOSIS — Z803 Family history of malignant neoplasm of breast: Secondary | ICD-10-CM

## 2020-04-02 DIAGNOSIS — Z7253 High risk bisexual behavior: Secondary | ICD-10-CM

## 2020-04-14 ENCOUNTER — Other Ambulatory Visit: Payer: Self-pay

## 2020-04-14 ENCOUNTER — Inpatient Hospital Stay (HOSPITAL_COMMUNITY)
Admission: AD | Admit: 2020-04-14 | Discharge: 2020-04-14 | Disposition: A | Discharge status: Home / Self Care | Payer: Medicaid Other | Attending: Family Medicine | Admitting: Family Medicine

## 2020-04-14 DIAGNOSIS — M549 Dorsalgia, unspecified: Secondary | ICD-10-CM | POA: Insufficient documentation

## 2020-04-14 DIAGNOSIS — Z3202 Encounter for pregnancy test, result negative: Secondary | ICD-10-CM | POA: Insufficient documentation

## 2020-04-14 DIAGNOSIS — R110 Nausea: Secondary | ICD-10-CM | POA: Diagnosis present

## 2020-04-14 DIAGNOSIS — N926 Irregular menstruation, unspecified: Secondary | ICD-10-CM

## 2020-04-14 LAB — POCT PREGNANCY, URINE: Preg Test, Ur: NEGATIVE
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
## 2020-04-14 NOTE — MAU Note (Signed)
Presents requesting pregnancy test.  Reports -HPT, but menstrual cycle late.  LMP 03/02/2020

## 2020-04-14 NOTE — MAU Provider Note (Signed)
Event Date/Time   First Provider Initiated Contact with Patient 04/14/20 1551      S [name] is a 20 y.o. G1P1001 patient who presents to MAU today with complaint of nausea and back pain. Thinks she is pregnant.   O BP 122/70 (BP Location: Right Arm)   Pulse 95   Temp 98.2 F (36.8 C) (Oral)   Resp 20   Ht 5\' 9"  (1.753 m)   Wt 110.6 kg   LMP 03/02/2020   SpO2 99%   BMI 36.00 kg/m  Physical Exam Vitals reviewed.  Constitutional:      Appearance: Normal appearance.  Abdominal:     General: Abdomen is flat.     Palpations: Abdomen is soft.  Skin:    Capillary Refill: Capillary refill takes less than 2 seconds.  Neurological:     Mental Status: She is alert.  Psychiatric:        Mood and Affect: Mood normal.        Behavior: Behavior normal.        Thought Content: Thought content normal.        Judgment: Judgment normal.     A Medical screening exam complete Back pain, Not pregnant.  P Discharge from MAU in stable condition Patient given the option of transfer to [facility] for further evaluation or seek care in outpatient facility of choice  Warning signs for worsening condition that would warrant emergency follow-up discussed Patient may return to MAU as needed   [facility], DO 04/14/2020 3:52 PM

## 2020-04-16 ENCOUNTER — Ambulatory Visit: Payer: Medicaid Other | Admitting: Family Medicine

## 2020-04-17 ENCOUNTER — Emergency Department (HOSPITAL_COMMUNITY): Payer: Medicaid Other

## 2020-04-17 ENCOUNTER — Other Ambulatory Visit (HOSPITAL_COMMUNITY): Payer: Medicaid Other

## 2020-04-17 ENCOUNTER — Emergency Department (HOSPITAL_COMMUNITY)
Admission: EM | Admit: 2020-04-17 | Discharge: 2020-04-17 | Disposition: A | Discharge status: Home / Self Care | Payer: Medicaid Other | Attending: Emergency Medicine | Admitting: Emergency Medicine

## 2020-04-17 ENCOUNTER — Encounter (HOSPITAL_COMMUNITY): Payer: Self-pay

## 2020-04-17 ENCOUNTER — Other Ambulatory Visit: Payer: Self-pay

## 2020-04-17 DIAGNOSIS — J45909 Unspecified asthma, uncomplicated: Secondary | ICD-10-CM | POA: Insufficient documentation

## 2020-04-17 DIAGNOSIS — N939 Abnormal uterine and vaginal bleeding, unspecified: Secondary | ICD-10-CM | POA: Insufficient documentation

## 2020-04-17 DIAGNOSIS — F17210 Nicotine dependence, cigarettes, uncomplicated: Secondary | ICD-10-CM | POA: Diagnosis not present

## 2020-04-17 DIAGNOSIS — R102 Pelvic and perineal pain: Secondary | ICD-10-CM

## 2020-04-17 LAB — WET PREP, GENITAL
Clue Cells Wet Prep HPF POC: NONE SEEN
Sperm: NONE SEEN
Trich, Wet Prep: NONE SEEN
Yeast Wet Prep HPF POC: NONE SEEN

## 2020-04-17 LAB — I-STAT BETA HCG BLOOD, ED (MC, WL, AP ONLY): I-stat hCG, quantitative: <5 m[IU]/mL (ref <5)

## 2020-04-17 NOTE — ED Provider Notes (Signed)
[facility] EMERGENCY DEPARTMENT Provider Note   CSN: [id number] Arrival date & time: 04/17/20  1215     History Chief Complaint  Patient presents with  . Vaginal Bleeding    [name] is a 20 y.o. female.  The history is provided by the patient.  Vaginal Bleeding Quality:  Typical of menses Severity:  Moderate Onset quality:  Gradual Timing:  Constant Progression:  Unchanged Chronicity:  New Menstrual history:  Regular Number of pads used:  1 Context: spontaneously   Relieved by:  Nothing Worsened by:  Nothing Associated symptoms: abdominal pain (cramping in pelvic area R>L)   Associated symptoms: no back pain, no dizziness, no dyspareunia, no dysuria, no fatigue, no fever, no nausea and no vaginal discharge   Risk factors: no ovarian cysts and no STD        Past Medical History:  Diagnosis Date  . Anxiety   . Asthma   . Headache(784.0)   . Hx of suicide attempt   . Major depressive disorder   . Morbid obesity (HCC) 03/25/2020  . PTSD (post-traumatic stress disorder)     Patient Active Problem List   Diagnosis Date Noted  . Irritable bowel syndrome 03/25/2020  . Morbid obesity (HCC) 03/25/2020  . Substance induced mood disorder (HCC) 03/24/2020  . Asthma due to seasonal allergies 03/13/2020  . Seasonal allergies 03/13/2020  . Tobacco abuse 03/13/2020  . Homelessness 03/13/2020  . Family history of breast cancer 03/13/2020  . ASCUS with positive high risk HPV cervical 03/13/2020  . Bipolar affective disorder, current episode manic (HCC) 01/24/2020  . 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 disorder 01/24/2020  . Substance use disorder 06/08/2017  . Elevated blood pressure reading 04/13/2017  . 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 Hx of suicide attempt 03/19/2016  . PTSD (post-traumatic stress disorder) 03/19/2016  . Generalized anxiety disorder   . Suicidal ideation     Past Surgical History:  Procedure Laterality Date  . wisdom tooth extraction        OB History    Gravida  1   Para  1   Term  1   Preterm  0   AB  0   Living  1     SAB  0   IAB  0   Ectopic  0   Multiple  0   Live Births  1           Family History  Problem Relation Age of Onset  . Diabetes Maternal Aunt   . Diabetes Maternal Grandmother   . Cancer Maternal Grandmother   . Breast cancer Maternal Grandmother   . Breast cancer Other     Social History   Tobacco Use  . Smoking status: Current Every Day Smoker    Packs/day: 0.50    Types: Cigarettes, Cigars  . Smokeless tobacco: Never Used  . Tobacco comment: black and mild  Vaping Use  . Vaping Use: Every day  Substance Use Topics  . Alcohol use: Yes    Comment: been drinking heavily last 2 weeks; a 1/5 of liquor a day  . Drug use: Yes    Types: Marijuana, MDMA (Ecstacy), Cocaine    Comment: Coc-last use 2/27; marijuana and MDMA last use 2/28    Home Medications Prior to Admission medications   Medication Sig Start Date End Date Taking? Authorizing Provider  albuterol (VENTOLIN HFA) 108 (90 Base) MCG/ACT inhaler Inhale 1 puff into the lungs every 6 (six) hours as needed for  wheezing or shortness of breath. 03/12/20   Mullis, Kiersten P, DO  ARIPiprazole (ABILIFY) 10 MG tablet Take 1 tablet (10 mg total) by mouth daily. For mood control 03/28/20   Armandina StammerNwoko, Agnes I, NP  busPIRone (BUSPAR) 10 MG tablet Take 1 tablet (10 mg total) by mouth 2 (two) times daily. For anxiety 03/28/20   Armandina StammerNwoko, Agnes I, NP  cetirizine (ZYRTEC) 10 MG tablet Take 1 tablet (10 mg total) by mouth daily. 03/12/20   Mullis, Kiersten P, DO  escitalopram (LEXAPRO) 10 MG tablet Take 1 tablet (10 mg total) by mouth daily. For depression 03/28/20 03/28/21  Armandina StammerNwoko, Agnes I, NP  lamoTRIgine (LAMICTAL) 25 MG tablet Take 1 tablet (25 mg total) by mouth daily. For mood stabilization 03/29/20   Nwoko, Nicole KindredAgnes I, NP  nicotine (NICODERM CQ - DOSED IN MG/24 HOURS) 21 mg/24hr patch Place 1 patch (21 mg total) onto the skin daily. (May buy  from over the counter): For smoking cessation 03/29/20   Armandina StammerNwoko, Agnes I, NP  prazosin (MINIPRESS) 1 MG capsule Take 1 capsule (1 mg total) by mouth at bedtime. For nightmares 03/28/20   Armandina StammerNwoko, Agnes I, NP  traZODone (DESYREL) 300 MG tablet Take 1 tablet (300 mg total) by mouth at bedtime. For sleep 03/28/20   Armandina StammerNwoko, Agnes I, NP    Allergies    Lactose intolerance (gi) and Tape  Review of Systems   Review of Systems  Constitutional: Negative for chills, fatigue and fever.  HENT: Negative for ear pain and sore throat.   Eyes: Negative for pain and visual disturbance.  Respiratory: Negative for cough and shortness of breath.   Cardiovascular: Negative for chest pain and palpitations.  Gastrointestinal: Positive for abdominal pain (cramping in pelvic area R>L). Negative for nausea and vomiting.  Genitourinary: Positive for menstrual problem, pelvic pain and vaginal bleeding. Negative for decreased urine volume, difficulty urinating, dyspareunia, dysuria, enuresis, flank pain, frequency, genital sores, hematuria, urgency, vaginal discharge and vaginal pain.  Musculoskeletal: Negative for arthralgias and back pain.  Skin: Negative for color change and rash.  Neurological: Negative for dizziness, seizures and syncope.  All other systems reviewed and are negative.   Physical Exam Updated Vital Signs BP 122/82 (BP Location: Right Arm)   Pulse 84   Temp 98.3 F (36.8 C) (Oral)   Resp 18   LMP 03/17/2020   SpO2 98%   Physical Exam Vitals and nursing note reviewed. Exam conducted with a chaperone present.  Constitutional:      General: She is not in acute distress.    Appearance: She is well-developed. She is not ill-appearing.  HENT:     Head: Normocephalic and atraumatic.     Nose: Nose normal.     Mouth/Throat:     Mouth: Mucous membranes are moist.  Eyes:     Extraocular Movements: Extraocular movements intact.     Conjunctiva/sclera: Conjunctivae normal.     Pupils: Pupils are equal,  round, and reactive to light.  Cardiovascular:     Rate and Rhythm: Normal rate and regular rhythm.     Pulses: Normal pulses.     Heart sounds: Normal heart sounds. No murmur heard.   Pulmonary:     Effort: Pulmonary effort is normal. No respiratory distress.     Breath sounds: Normal breath sounds.  Abdominal:     Palpations: Abdomen is soft.     Tenderness: There is no abdominal tenderness. There is no guarding or rebound.  Genitourinary:    General: Normal  vulva.     Labia:        Right: No rash or tenderness.        Left: No rash or tenderness.      Vagina: Bleeding (scant) present. No vaginal discharge.     Cervix: No cervical motion tenderness, discharge or lesion.     Uterus: Normal. Not tender.      Adnexa: Right adnexa normal and left adnexa normal.       Right: No mass or tenderness.         Left: No mass or tenderness.    Musculoskeletal:     Cervical back: Normal range of motion and neck supple.  Skin:    General: Skin is warm and dry.  Neurological:     Mental Status: She is alert.     ED Results / Procedures / Treatments   Labs (all labs ordered are listed, but only abnormal results are displayed) Labs Reviewed  WET PREP, GENITAL - Abnormal; Notable for the following components:      Result Value   WBC, Wet Prep HPF POC MODERATE (*)    All other components within normal limits  I-STAT BETA HCG BLOOD, ED ([facility], [facility], [facility] ONLY)  GC/CHLAMYDIA PROBE AMP ([place]) NOT AT [facility]  GC/CHLAMYDIA PROBE AMP ([place]) NOT AT [facility]    EKG None  Radiology US PELVIC COMPLETE W TRANSVAGINAL AND TORSION R/O  Result Date: 04/17/2020 CLINICAL DATA:  Pelvic pain with vaginal bleeding EXAM: TRANSABDOMINAL AND TRANSVAGINAL ULTRASOUND OF PELVIS DOPPLER ULTRASOUND OF OVARIES TECHNIQUE: Study was performed transabdominally to optimize pelvic field of view evaluation and transvaginally to optimize internal visceral architecture evaluation. Color and duplex Doppler ultrasound  was utilized to evaluate blood flow to the ovaries. COMPARISON:  CT abdomen pelvis June 27, 2019; pelvic ultrasound July 18, 2018 FINDINGS: Uterus Measurements: 8.1 x 3.4 x 4.0 cm = volume: 57.8 mL. No fibroids or other mass visualized. Endometrium Thickness: 10 mm.  No focal abnormality visualized. Right ovary Measurements: 3.4 x 1.7 x 3.0 cm = volume: 9.0 mL. Normal appearance/no adnexal mass. Left ovary Measurements: 3.8 x 2.0 x 2.3 cm = volume: 8.9 mL. Normal appearance/no adnexal mass. Pulsed Doppler evaluation of both ovaries demonstrates normal low-resistance arterial and venous waveforms. Other findings Trace free fluid. IMPRESSION: 1.  Trace free fluid which may be within physiologic range. 2. No intrauterine or extrauterine pelvic or adnexal mass. No endometrial thickening. 3.  No findings indicative of ovarian torsion on either side. Electronically Signed   By: Bretta Bang III M.D.   On: 04/17/2020 14:51    Procedures Procedures   Medications Ordered in ED Medications - No data to display  ED Course  I have reviewed the triage vital signs and the nursing notes.  Pertinent labs & imaging results that were available during my care of the patient were reviewed by me and considered in my medical decision making (see chart for details).    MDM Rules/Calculators/A&P                          [name] is here with vaginal bleeding.  Pregnancy test negative.  Vital signs normal.  Pelvic exam is overall unremarkable.  Wet prep negative for Trichomonas and bacterial vaginosis.  No concern for STD and will wait on treatment with gonorrhea and Chlamydia testing sent.  Pelvic ultrasound was ordered to rule out torsion/cyst and was overall normal.  Overall suspect abnormal uterine  bleeding and likely is having cramping pain from bleeding.  Recommend Tylenol Motrin for pain.  Recommend follow-up with primary care doctor.  Discharged in good condition. No concern for PID.  This chart was  dictated using voice recognition software.  Despite best efforts to proofread,  errors can occur which can change the documentation meaning.   Final Clinical Impression(s) / ED Diagnoses Final diagnoses:  Vaginal bleeding    Rx / DC Orders ED Discharge Orders    None       Virgina Norfolk, DO 04/17/20 1502

## 2020-04-17 NOTE — ED Triage Notes (Signed)
Pt reports she is here today due to vaginal bleeding since this morning. [facility] and had urine test done and was negative, Pt called EMS for transport because she would like a blood test to see if she is pregnant.

## 2020-04-18 DIAGNOSIS — Z0289 Encounter for other administrative examinations: Secondary | ICD-10-CM

## 2020-04-24 ENCOUNTER — Other Ambulatory Visit: Payer: Self-pay

## 2020-04-24 ENCOUNTER — Ambulatory Visit (HOSPITAL_COMMUNITY): Payer: Medicaid Other | Admitting: Clinical

## 2020-04-24 ENCOUNTER — Telehealth (HOSPITAL_COMMUNITY): Payer: Self-pay | Admitting: Clinical

## 2020-04-24 NOTE — Telephone Encounter (Signed)
Therapist sent the client a link for the virtual therapy visit. Client did not check in using the link. Therapist attempted to follow up and called the client by tele-phone. Client did not answer the phone. Therapist was unable to leave a voice mail because it was full.

## 2020-05-06 ENCOUNTER — Other Ambulatory Visit: Payer: Self-pay

## 2020-05-06 ENCOUNTER — Telehealth (HOSPITAL_COMMUNITY): Payer: Medicaid Other | Admitting: Family

## 2020-05-06 ENCOUNTER — Telehealth (HOSPITAL_COMMUNITY): Payer: Medicaid Other | Admitting: Physician Assistant

## 2020-05-06 NOTE — Progress Notes (Unsigned)
NP attempted to follow-up with patient via telephone and [place] " voicemail box is full" patient to reschedule appointment.

## 2020-05-26 ENCOUNTER — Telehealth (HOSPITAL_COMMUNITY): Payer: Self-pay | Admitting: Physician Assistant

## 2020-05-26 NOTE — Telephone Encounter (Signed)
Pt called requesting refills of medications. Pt missed last appointment. Offered next appointment, which is June. Offered walk-in hours to arrive early 7:15 am [facility].  Forwarding medication request to provider for review.

## 2020-06-16 ENCOUNTER — Ambulatory Visit: Payer: Medicaid Other | Attending: Internal Medicine

## 2020-06-16 DIAGNOSIS — Z20822 Contact with and (suspected) exposure to covid-19: Secondary | ICD-10-CM

## 2020-06-17 LAB — SARS-COV-2, NAA 2 DAY TAT

## 2020-06-17 LAB — NOVEL CORONAVIRUS, NAA: SARS-CoV-2, NAA: NOT DETECTED

## 2020-07-08 ENCOUNTER — Other Ambulatory Visit: Payer: Self-pay

## 2020-07-08 ENCOUNTER — Ambulatory Visit (INDEPENDENT_AMBULATORY_CARE_PROVIDER_SITE_OTHER): Payer: Medicaid Other | Admitting: Physician Assistant

## 2020-07-08 VITALS — BP 143/98 | HR 100 | Ht 69.0 in | Wt 247.0 lb

## 2020-07-08 DIAGNOSIS — F3110 Bipolar disorder, current episode manic without psychotic features, unspecified: Secondary | ICD-10-CM

## 2020-07-08 DIAGNOSIS — F5105 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 disorder: Secondary | ICD-10-CM | POA: Diagnosis not present

## 2020-07-08 DIAGNOSIS — F411 Generalized anxiety disorder: Secondary | ICD-10-CM

## 2020-07-08 DIAGNOSIS — F4310 Post-traumatic stress disorder, unspecified: Secondary | ICD-10-CM | POA: Diagnosis not present

## 2020-07-08 DIAGNOSIS — F99 Mental disorder, not otherwise specified: Secondary | ICD-10-CM

## 2020-07-08 MED ORDER — LAMOTRIGINE 25 MG PO TABS: 25.0000 mg | ORAL_TABLET | Freq: Every day | ORAL | 2 refills | Status: DC

## 2020-07-08 MED ORDER — BUSPIRONE HCL 10 MG PO TABS: 10.0000 mg | ORAL_TABLET | Freq: Two times a day (BID) | ORAL | 2 refills | Status: DC

## 2020-07-08 MED ORDER — PRAZOSIN HCL 1 MG PO CAPS: 1.0000 mg | ORAL_CAPSULE | Freq: Every day | ORAL | 0 refills | Status: DC

## 2020-07-08 MED ORDER — PRAZOSIN HCL 1 MG PO CAPS: 1.0000 mg | ORAL_CAPSULE | Freq: Every day | ORAL | 1 refills | Status: DC

## 2020-07-08 MED ORDER — ESCITALOPRAM OXALATE 10 MG PO TABS: 10.0000 mg | ORAL_TABLET | Freq: Every day | ORAL | 2 refills | Status: DC

## 2020-07-08 MED ORDER — TRAZODONE HCL 300 MG PO TABS: 300.0000 mg | ORAL_TABLET | Freq: Every day | ORAL | 2 refills | Status: DC

## 2020-07-08 MED ORDER — ARIPIPRAZOLE 10 MG PO TABS: 10.0000 mg | ORAL_TABLET | Freq: Every day | ORAL | 2 refills | Status: DC

## 2020-07-08 NOTE — Progress Notes (Signed)
BH MD/PA/NP OP Progress Note  07/08/2020 5:20 PM [name]  MRN:  [id number]  Chief Complaint:  Chief Complaint   Medication Management    HPI:   [name] is a 20 year old female with a past psychiatric history significant for generalized anxiety disorder, PTSD, bipolar affective disorder, and insomnia who presents to [facility] for follow-up and medication management.  Patient is accompanied by her peer support counselor. Patient is currently being managed on the following medications:  Abilify 10 mg daily Buspirone 10 mg 2 times daily Escitalopram 10 mg daily Trazodone 300 mg at bedtime Prazosin 1 mg at bedtime Lamotrigine 25 mg daily  Per chart review, patient had been admitted to [facility] to homicidal/suicidal thoughts with a plan to overdose along with worsening drug use.  Patient was discharged on her current regimen of medications.  Patient reports that she has a lot of unresolved issues but understands the importance of remaining on her current regimen of medications.  Patient is requesting refills on all her medications following the conclusion of the encounter.  Patient reports that she has been experiencing fluctuating mood characterized by extreme highs and lows.  She also endorses impulsivity, insomnia, poor appetite, and depressed mood.  Patient endorses anxiety and currently rates it a 3 or 4 out of 10.  Patient states that she is often irritable but it is often secondary to the stressors she is currently dealing with.  Patient has been dealing with the following stressors: the passing of her [name], financial instability, and homelessness.  A PHQ-9 screen was performed with the patient scoring a 24.  A GAD-7 screen was also performed with the patient scoring a 21.  Patient is pleasant, calm, cooperative, and fully engaged in conversation during the encounter.  Patient states that she is happy and over energetic  coupled with racing thoughts.  Patient denies suicidal or homicidal ideations but states that her most recent suicide attempt occurred in March.  A [place] Suicide Severity Rating Scale was performed with the patient being considered high risk.  Patient denies feeling like a danger to herself and is able to contract for safety following the conclusion of the encounter.  Patient's further denies auditory or visual hallucinations and does not appear to be responding to internal/external stimuli.  Patient endorses fluctuating sleep and states that she receives 10 to 12 hours of sleep in her depressive state.  When experiencing a manic episode, patient states that she receives on average 3 to 4 hours of sleep each night.  Patient endorses episodes of binge eating but states that she has at least 1 meal per day.  Per peer support counselor, patient struggles with food security.  Patient endorses alcohol consumption and states that she has 1-3 drinks per week.  Patient endorses tobacco use and states that she has 1 to 3 cigarettes/day on a good day but on a bad day she has roughly a pack per day.  Patient endorses illicit drug use and states that she smoked marijuana today.  Visit Diagnosis:    ICD-10-CM   1. Bipolar affective disorder, current episode manic, current episode severity unspecified (HCC)  F31.10 ARIPiprazole (ABILIFY) 10 MG tablet    escitalopram (LEXAPRO) 10 MG tablet    lamoTRIgine (LAMICTAL) 25 MG tablet    2. Generalized anxiety disorder  F41.1 busPIRone (BUSPAR) 10 MG tablet    escitalopram (LEXAPRO) 10 MG tablet    3. PTSD (post-traumatic stress disorder)  F43.10 escitalopram (LEXAPRO)  10 MG tablet    prazosin (MINIPRESS) 1 MG capsule    DISCONTINUED: prazosin (MINIPRESS) 1 MG capsule    4. 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 disorder  F51.05 trazodone (DESYREL) 300 MG tablet   F99       Past Psychiatric History:  Bipolar disorder Generalized anxiety disorder Insomnia PTSD  Past  Medical History:  Past Medical History:  Diagnosis Date   Anxiety    Asthma    Headache(784.0)    Hx of suicide attempt    Major depressive disorder    Morbid obesity (HCC) 03/25/2020   PTSD (post-traumatic stress disorder)     Past Surgical History:  Procedure Laterality Date   wisdom tooth extraction      Family Psychiatric History:  Mother - patient believes her mother is undiagnosed with bipolar disorder Grandmother (maternal) - Bipolar Disorder  Family History:  Family History  Problem Relation Age of Onset   Diabetes Maternal Aunt    Diabetes Maternal Grandmother    Cancer Maternal Grandmother    Breast cancer Maternal Grandmother    Breast cancer Other     Social History:  Social History   Socioeconomic History   Marital status: Married    Spouse name: Not on file   Number of children: 2   Years of education: Not on file   Highest education level: Some college, no degree  Occupational History   Not on file  Tobacco Use   Smoking status: Every Day    Packs/day: 0.50    Pack years: 0.00    Types: Cigarettes, Cigars   Smokeless tobacco: Never   Tobacco comments:    black and mild  Vaping Use   Vaping Use: Every day  Substance and Sexual Activity   Alcohol use: Yes    Comment: been drinking heavily last 2 weeks; a 1/5 of liquor a day   Drug use: Yes    Types: Marijuana, MDMA (Ecstacy), Cocaine    Comment: Coc-last use 2/27; marijuana and MDMA last use 2/28   Sexual activity: Yes    Partners: Male    Birth control/protection: None, Injection  Other Topics Concern   Not on file  Social History Narrative   Pt and family are homeless. She, her wife and two kids live in her cousin's house   Social Determinants of Health   Financial Resource Strain: Not on file  Food Insecurity: Not on file  Transportation Needs: Not on file  Physical Activity: Not on file  Stress: Not on file  Social Connections: Not on file    Allergies:  Allergies  Allergen  Reactions   Lactose Intolerance (Gi) Nausea And Vomiting and Other (See Comments)   Tape Other (See Comments)    Slight skin irritation    Metabolic Disorder Labs: Lab Results  Component Value Date   HGBA1C 5.4 03/25/2020   MPG 108.28 03/25/2020   MPG 111 05/22/2019   Lab Results  Component Value Date   PROLACTIN 6.6 12/28/2018   PROLACTIN 5.8 08/23/2017   Lab Results  Component Value Date   CHOL 152 03/25/2020   TRIG 41 03/25/2020   HDL 50 03/25/2020   CHOLHDL 3.0 03/25/2020   VLDL 8 03/25/2020   LDLCALC 94 03/25/2020   LDLCALC 116 (H) 12/28/2018   Lab Results  Component Value Date   TSH 1.181 03/25/2020   TSH 0.635 12/28/2018    Therapeutic Level Labs: No results found for: LITHIUM No results found for: VALPROATE No components found for:  CBMZ  Current Medications: Current Outpatient Medications  Medication Sig Dispense Refill   albuterol (VENTOLIN HFA) 108 (90 Base) MCG/ACT inhaler Inhale 1 puff into the lungs every 6 (six) hours as needed for wheezing or shortness of breath. 18 g 2   cetirizine (ZYRTEC) 10 MG tablet Take 1 tablet (10 mg total) by mouth daily. 30 tablet 11   nicotine (NICODERM CQ - DOSED IN MG/24 HOURS) 21 mg/24hr patch Place 1 patch (21 mg total) onto the skin daily. (May buy from over the counter): For smoking cessation 28 patch 0   ARIPiprazole (ABILIFY) 10 MG tablet Take 1 tablet (10 mg total) by mouth daily. For mood control 30 tablet 2   busPIRone (BUSPAR) 10 MG tablet Take 1 tablet (10 mg total) by mouth 2 (two) times daily. For anxiety 60 tablet 2   escitalopram (LEXAPRO) 10 MG tablet Take 1 tablet (10 mg total) by mouth daily. For depression 30 tablet 2   lamoTRIgine (LAMICTAL) 25 MG tablet Take 1 tablet (25 mg total) by mouth daily. For mood stabilization 30 tablet 2   prazosin (MINIPRESS) 1 MG capsule Take 1 capsule (1 mg total) by mouth at bedtime. For nightmares 30 capsule 1   trazodone (DESYREL) 300 MG tablet Take 1 tablet (300 mg  total) by mouth at bedtime. For sleep 30 tablet 2   No current facility-administered medications for this visit.     Musculoskeletal: Strength & Muscle Tone: within normal limits Gait & Station: normal Patient leans: N/A  Psychiatric Specialty Exam: Review of Systems  Psychiatric/Behavioral:  Positive for decreased concentration and sleep disturbance. Negative for dysphoric mood, hallucinations, self-injury and suicidal ideas. The patient is nervous/anxious and is hyperactive.    Blood pressure (!) 143/98, pulse 100, height 5\' 9"  (1.753 m), weight 247 lb (112 kg).Body mass index is 36.48 kg/m.  General Appearance: Well Groomed  Eye Contact:  Good  Speech:  Clear and Coherent and Normal Rate  Volume:  Normal  Mood:  Anxious, Irritable, and hypomanic  Affect:  Congruent  Thought Process:  Coherent, Goal Directed, and Descriptions of Associations: Tangential  Orientation:  Full (Time, Place, and Person)  Thought Content: WDL, Rumination, and Tangential   Suicidal Thoughts:  No  Homicidal Thoughts:  No  Memory:  Immediate;   Good Recent;   Fair Remote;   Fair  Judgement:  Fair  Insight:  Fair  Psychomotor Activity:  Normal  Concentration:  Concentration: Good and Attention Span: Good  Recall:  Good  Fund of Knowledge: Good  Language: Good  Akathisia:  NA  Handed:  Right  AIMS (if indicated): not done  Assets:  Communication Skills Desire for Improvement Social Support  ADL's:  Intact  Cognition: WNL  Sleep:  Fair   Screenings: AIMS    Flowsheet Row Ad[facility] (Discharged) [facility] Ad[facility] (Discharged) from [facility] Visit [facility] Admission (Discharged) from OP Visit [facility] Ad[facility] (Dis[facility]) [facility]  AIMS Total Score 0 0 0 0       AUDIT    Flowsheet Row Admission (Discharged) from [facility] Admission (Discharged) from OP Visit from [facility] Admission (Discharged) from OP Visit from 03[facility] Admission (Discharged) from OP  [facility] Admission (Discharged) from [facility]06[facility]2013 [facility]  Alcohol Use Disorder Identification Test Final Score (AUDIT) 15 2 3  0 1      GAD-7    Flowsheet Row Clinical [facility] from [facility]2020 [facility] Counselor from [facility]/08[facility]2020 [facility]2017 [facility] from 08/23/2017 [facility]  Total GAD-7 Score 21 19 7 18 17       PHQ2-9    Flowsheet Row Clinical [facility]2020 [facility] from [facility]/21[facility]2020 [facility] Office Visit from [facility]12[facility]2020 [facility] from 01/22/2020 [facility]2020 [facility]  PHQ-2 Total Score 5 6 6 6 3   PHQ-9 Total Score 24 24 25 26 10       Flowsheet Row Clinical Support [facility]/08[facility]2020 [facility] Video [facility]2020 [facility]  C-SSRS RISK CATEGORY High Risk High Risk High Risk        Assessment and Plan:   Yolanda SmallCamia [name] is a 20 year old female  with a past psychiatric history significant for generalized anxiety disorder, PTSD, bipolar affective disorder, and insomnia [facility] and medication management.  Patient is accompanied by her peer support counselor.  [facility] and [facility] on her current regimen of medications.  Patient is requesting refills on all her medications following the conclusion of the encounter.  Patient is currently experiencing fluctuating mood, impulsivity, fluctuating sleep, and irritability.  Patient also endorses anxiety but states that it is tolerable at this time.  Patient's medications to be e-prescribed to pharmacy of choice.  1. Bipolar affective disorder, current episode manic, current episode severity unspecified (HCC)  - ARIPiprazole (ABILIFY) 10 MG tablet; Take 1 tablet (10 mg total) by mouth daily. For mood control  Dispense: 30 tablet; Refill: 2 - escitalopram (LEXAPRO) 10 MG tablet; Take 1 tablet (10 mg total) by mouth daily. For depression  Dispense: 30 tablet; Refill: 2 - lamoTRIgine (LAMICTAL) 25 MG tablet; Take 1 tablet (25 mg total) by mouth daily. For mood stabilization  Dispense: 30 tablet; Refill: 2  2. Generalized anxiety disorder  - busPIRone (BUSPAR) 10 MG tablet; Take 1 tablet (10 mg total) by mouth 2 (two) times daily. For anxiety  Dispense: 60 tablet; Refill: 2 - escitalopram (LEXAPRO) 10 MG tablet; Take 1 tablet (10 mg total) by mouth daily. For depression  Dispense: 30 tablet; Refill: 2  3. PTSD (post-traumatic stress disorder)  - escitalopram (LEXAPRO) 10 MG tablet; Take 1 tablet (10 mg total) by mouth daily. For depression  Dispense: 30 tablet; Refill: 2 - prazosin (MINIPRESS) 1 MG capsule; Take 1 capsule (1 mg total) by mouth at bedtime. For nightmares  Dispense: 30 capsule; Refill: 1  4. 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 disorder  - trazodone (DESYREL) 300 MG tablet; Take 1 tablet (300  mg total) by mouth at bedtime. For sleep  Dispense: 30 tablet; Refill: 2  Patient  to follow-up in 6 weeks Provider spent roughly 30 minutes total with the patient/reviewing patient's chart  Meta Hatchet, PA 07/08/2020, 5:20 PM

## 2020-07-09 ENCOUNTER — Encounter (HOSPITAL_COMMUNITY): Payer: Self-pay | Admitting: Physician Assistant

## 2020-07-25 ENCOUNTER — Ambulatory Visit: Payer: Self-pay | Admitting: Family Medicine

## 2020-07-31 ENCOUNTER — Ambulatory Visit: Payer: Self-pay | Admitting: Family Medicine

## 2020-08-20 ENCOUNTER — Ambulatory Visit (INDEPENDENT_AMBULATORY_CARE_PROVIDER_SITE_OTHER): Payer: Medicaid Other | Admitting: Physician Assistant

## 2020-08-20 ENCOUNTER — Encounter (HOSPITAL_COMMUNITY): Payer: Self-pay | Admitting: Physician Assistant

## 2020-08-20 ENCOUNTER — Other Ambulatory Visit: Payer: Self-pay

## 2020-08-20 ENCOUNTER — Encounter (HOSPITAL_COMMUNITY): Payer: Medicaid Other | Admitting: Physician Assistant

## 2020-08-20 VITALS — BP 134/92 | HR 87 | Ht 69.0 in | Wt 238.0 lb

## 2020-08-20 DIAGNOSIS — F1210 Cannabis abuse, uncomplicated: Secondary | ICD-10-CM | POA: Diagnosis not present

## 2020-08-20 DIAGNOSIS — F3110 Bipolar disorder, current episode manic without psychotic features, unspecified: Secondary | ICD-10-CM

## 2020-08-20 DIAGNOSIS — F5105 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 disorder: Secondary | ICD-10-CM

## 2020-08-20 DIAGNOSIS — F411 Generalized anxiety disorder: Secondary | ICD-10-CM | POA: Diagnosis not present

## 2020-08-20 DIAGNOSIS — R41840 Attention and concentration deficit: Secondary | ICD-10-CM | POA: Insufficient documentation

## 2020-08-20 DIAGNOSIS — F99 Mental disorder, not otherwise specified: Secondary | ICD-10-CM

## 2020-08-20 DIAGNOSIS — F4310 Post-traumatic stress disorder, unspecified: Secondary | ICD-10-CM

## 2020-08-20 MED ORDER — ARIPIPRAZOLE 10 MG PO TABS: 10.0000 mg | ORAL_TABLET | Freq: Every day | ORAL | 2 refills | Status: DC

## 2020-08-20 MED ORDER — ATOMOXETINE HCL 40 MG PO CAPS: 40.0000 mg | ORAL_CAPSULE | Freq: Every day | ORAL | 1 refills | Status: DC

## 2020-08-20 MED ORDER — TRAZODONE HCL 300 MG PO TABS: 300.0000 mg | ORAL_TABLET | Freq: Every day | ORAL | 2 refills | Status: DC

## 2020-08-20 MED ORDER — ESCITALOPRAM OXALATE 10 MG PO TABS: 10.0000 mg | ORAL_TABLET | Freq: Every day | ORAL | 2 refills | Status: DC

## 2020-08-20 MED ORDER — BUSPIRONE HCL 10 MG PO TABS: 10.0000 mg | ORAL_TABLET | Freq: Two times a day (BID) | ORAL | 2 refills | Status: DC

## 2020-08-20 MED ORDER — LAMOTRIGINE 25 MG PO TABS: 50.0000 mg | ORAL_TABLET | Freq: Every day | ORAL | 1 refills | Status: DC

## 2020-08-20 MED ORDER — PRAZOSIN HCL 1 MG PO CAPS: 1.0000 mg | ORAL_CAPSULE | Freq: Every day | ORAL | 1 refills | Status: DC

## 2020-08-20 NOTE — Progress Notes (Signed)
BH MD/PA/NP OP Progress Note  08/20/2020 7:29 PM [name]  MRN:  [id number]  Chief Complaint:  Chief Complaint   Medication Management    HPI:   [name] is a 20 year old female with a past psychiatric history significant for generalized anxiety disorder, PTSD, bipolar disorder, insomnia who presents to [facility] for follow-up and medication management.  Patient is accompanied by her peer support counselor during the encounter.  Patient is currently being managed on the following medications:  Aripiprazole 10 mg daily Buspirone 10 mg 2 times daily Escitalopram 10 mg daily Lamotrigine 25 mg daily Prazosin 1 mg at bedtime Trazodone 300 mg at bedtime  Patient reports that her depression and anxiety have been manageable as of late.  Patient is continuing to take her medications as prescribed. She reports that she has been dealing with aggression, anger, and homicidal ideations towards people that annoy her.  She also states that her memory has improved some but her concentration is impaired.  Patient attributes some of her agitation to her concentration issues.  Patient rates her anxiety a 1-2 out of 10 and has not been experiencing any panic attacks.  Patient denies experiencing any major depressive symptoms and states that she feels genuine happiness at this time.  Patient's current stressors include wanting to get an apartment, ongoing headaches, and poor nutrient intake.  A PHQ-9 screen was performed with the patient scoring a 13.  A GAD-7 screen was also performed with the patient scoring to 20.  Patient is alert and oriented x4, pleasant, cooperative, and fully engaged in conversation during the encounter.  Patient reports that she is currently calm and happy.  Patient denies suicidal ideations.  Patient does endorse homicidal ideations towards random people and her cousin and is unsure if she would act on these thoughts.  Patient endorses  visual hallucinations characterized as a community of people that support and talk to her.  She describes this community of people as good-natured spirits.  She denies auditory hallucinations.  She endorses good sleep and receives on average 8 to 10 hours of sleep each night.  Patient endorses fluctuating appetite and reports that she does not eat as much as she should.  Patient endorses moderate alcohol consumption.  She endorses tobacco use and smokes on average 1/2 pack/day.  She endorses illicit drug use in the form of marijuana.   Visit Diagnosis:    ICD-10-CM   1. Cannabis use disorder, mild, abuse  F12.10     2. Bipolar affective disorder, current episode manic, current episode severity unspecified (HCC)  F31.10 ARIPiprazole (ABILIFY) 10 MG tablet    escitalopram (LEXAPRO) 10 MG tablet    lamoTRIgine (LAMICTAL) 25 MG tablet    3. Generalized anxiety disorder  F41.1 busPIRone (BUSPAR) 10 MG tablet    escitalopram (LEXAPRO) 10 MG tablet    4. PTSD (post-traumatic stress disorder)  F43.10 escitalopram (LEXAPRO) 10 MG tablet    prazosin (MINIPRESS) 1 MG capsule    5. 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 disorder  F51.05 trazodone (DESYREL) 300 MG tablet   F99     6. Attention and concentration deficit  R41.840 atomoxetine (STRATTERA) 40 MG capsule      Past Psychiatric History:  Bipolar disorder Generalized anxiety disorder Insomnia PTSD  Past Medical History:  Past Medical History:  Diagnosis Date   Anxiety    Asthma    Headache(784.0)    Hx of suicide attempt    Major depressive disorder  Morbid obesity (HCC) 03/25/2020   PTSD (post-traumatic stress disorder)     Past Surgical History:  Procedure Laterality Date   wisdom tooth extraction      Family Psychiatric History:  Mother - patient believes her mother is undiagnosed with bipolar disorder Grandmother (maternal) - Bipolar Disorder  Family History:  Family History  Problem Relation Age of Onset   Diabetes Maternal  Aunt    Diabetes Maternal Grandmother    Cancer Maternal Grandmother    Breast cancer Maternal Grandmother    Breast cancer Other     Social History:  Social History   Socioeconomic History   Marital status: Married    Spouse name: Not on file   Number of children: 2   Years of education: Not on file   Highest education level: Some college, no degree  Occupational History   Not on file  Tobacco Use   Smoking status: Every Day    Packs/day: 0.50    Types: Cigarettes, Cigars   Smokeless tobacco: Never   Tobacco comments:    black and mild  Vaping Use   Vaping Use: Every day  Substance and Sexual Activity   Alcohol use: Yes    Comment: been drinking heavily last 2 weeks; a 1/5 of liquor a day   Drug use: Yes    Types: Marijuana, MDMA (Ecstacy), Cocaine    Comment: Coc-last use 2/27; marijuana and MDMA last use 2/28   Sexual activity: Yes    Partners: Male    Birth control/protection: None, Injection  Other Topics Concern   Not on file  Social History Narrative   Pt and family are homeless. She, her wife and two kids live in her cousin's house   Social Determinants of Health   Financial Resource Strain: Not on file  Food Insecurity: Not on file  Transportation Needs: Not on file  Physical Activity: Not on file  Stress: Not on file  Social Connections: Not on file    Allergies:  Allergies  Allergen Reactions   Lactose Intolerance (Gi) Nausea And Vomiting and Other (See Comments)   Tape Other (See Comments)    Slight skin irritation    Metabolic Disorder Labs: Lab Results  Component Value Date   HGBA1C 5.4 03/25/2020   MPG 108.28 03/25/2020   MPG 111 05/22/2019   Lab Results  Component Value Date   PROLACTIN 6.6 12/28/2018   PROLACTIN 5.8 08/23/2017   Lab Results  Component Value Date   CHOL 152 03/25/2020   TRIG 41 03/25/2020   HDL 50 03/25/2020   CHOLHDL 3.0 03/25/2020   VLDL 8 03/25/2020   LDLCALC 94 03/25/2020   LDLCALC 116 (H) 12/28/2018    Lab Results  Component Value Date   TSH 1.181 03/25/2020   TSH 0.635 12/28/2018    Therapeutic Level Labs: No results found for: LITHIUM No results found for: VALPROATE No components found for:  CBMZ  Current Medications: Current Outpatient Medications  Medication Sig Dispense Refill   albuterol (VENTOLIN HFA) 108 (90 Base) MCG/ACT inhaler Inhale 1 puff into the lungs every 6 (six) hours as needed for wheezing or shortness of breath. 18 g 2   atomoxetine (STRATTERA) 40 MG capsule Take 1 capsule (40 mg total) by mouth daily. 30 capsule 1   cetirizine (ZYRTEC) 10 MG tablet Take 1 tablet (10 mg total) by mouth daily. 30 tablet 11   nicotine (NICODERM CQ - DOSED IN MG/24 HOURS) 21 mg/24hr patch Place 1 patch (21 mg total)  onto the skin daily. (May buy from over the counter): For smoking cessation 28 patch 0   ARIPiprazole (ABILIFY) 10 MG tablet Take 1 tablet (10 mg total) by mouth daily. For mood control 30 tablet 2   busPIRone (BUSPAR) 10 MG tablet Take 1 tablet (10 mg total) by mouth 2 (two) times daily. For anxiety 60 tablet 2   escitalopram (LEXAPRO) 10 MG tablet Take 1 tablet (10 mg total) by mouth daily. For depression 30 tablet 2   lamoTRIgine (LAMICTAL) 25 MG tablet Take 2 tablets (50 mg total) by mouth daily. For mood stabilization 60 tablet 1   prazosin (MINIPRESS) 1 MG capsule Take 1 capsule (1 mg total) by mouth at bedtime. For nightmares 30 capsule 1   trazodone (DESYREL) 300 MG tablet Take 1 tablet (300 mg total) by mouth at bedtime. For sleep 30 tablet 2   No current facility-administered medications for this visit.     Musculoskeletal: Strength & Muscle Tone: within normal limits Gait & Station: normal Patient leans: N/A  Psychiatric Specialty Exam: Review of Systems  Psychiatric/Behavioral:  Positive for agitation, decreased concentration and hallucinations. Negative for dysphoric mood, self-injury, sleep disturbance and suicidal ideas. The patient is  nervous/anxious. The patient is not hyperactive.    Blood pressure (!) 134/92, pulse 87, height 5\' 9"  (1.753 m), weight 238 lb (108 kg).Body mass index is 35.15 kg/m.  General Appearance: Well Groomed  Eye Contact:  Good  Speech:  Clear and Coherent and Normal Rate  Volume:  Normal  Mood:  Anxious, Depressed, and Irritable  Affect:  Congruent and Depressed  Thought Process:  Coherent, Goal Directed, and Descriptions of Associations: Tangential  Orientation:  Full (Time, Place, and Person)  Thought Content: WDL, Hallucinations: Visual, and Tangential   Suicidal Thoughts:  No  Homicidal Thoughts:  Yes.  without intent/plan  Memory:  Immediate;   Good Recent;   Fair Remote;   Fair  Judgement:  Fair  Insight:  Fair  Psychomotor Activity:  Normal  Concentration:  Concentration: Fair and Attention Span: Fair  Recall:  Good  Fund of Knowledge: NA  Language: Good  Akathisia:  NA  Handed:  Right  AIMS (if indicated): not done  Assets:  Communication Skills Desire for Improvement Social Support  ADL's:  Intact  Cognition: WNL  Sleep:  Good   Screenings: AIMS    Flowsheet Row Ad[facility] (Discharged) [facility] Ad[facility] (Discharged) from [facility] Visit [facility] Admission (Discharged) from OP [facility] Ad[facility] (Dis[facility]) [facility]  AIMS Total Score 0 0 0 0      AUDIT    Flowsheet Row Admission (Discharged) from [facility] Admission (Discharged) from OP Visit from [facility] Admission (Discharged) from OP Visit from [facility] Admission (Discharged) from OP [facility] Admission (Discharged) from [facility]06[facility]  Alcohol Use Disorder Identification Test Final Score (AUDIT) 15 2 3  0 1      GAD-7    Flowsheet Row Clinical Support from 08/20/2020 in [facility] Clinical Support from 07/08/2020 in [facility] Video Visit from 03/21/2020 [facility] [place]  [facility]2020 [facility]2017 [facility] [place] [facility]  Total GAD-7 Score 20 21 19 7 18       PHQ2-9    Flowsheet Row Clinical [facility] Clinical [facility]2020 [facility] Video Visit from 03/21[facility]2020 [facility] from [facility]22/2020 [facility]  PHQ-2 Total Score 2 5 6 6 6   PHQ-9 Total Score 13 24 24 25 26       Flowsheet Row [facility]2020 [facility] Admission (Discharged) from OP [facility]  C-SSRS RISK CATEGORY High Risk High Risk High Risk        Assessment and Plan:   [name] is a 20 year old female with a past psychiatric history significant for generalized anxiety disorder, PTSD, bipolar disorder, insomnia [facility]-up and medication management.  Patient is accompanied by her peer support counselor during the encounter.  Patient reports no issues or concerns regarding her current medication regimen and states that her anxiety and depression have  been manageable as of late.  Patient does endorse experiencing agitation and homicidal ideations towards people that annoy her.  She also endorses issues with her concentration.  Patient was recommended Strattera 40 mg daily for the management of her concentration.  Patient was agreeable to recommendation.  Patient's medications to be e-prescribed to pharmacy of choice.  Patient was advised to avoid stressors that may contribute to her agitation.  Patient was also encouraged to engage in any coping skills that would help to prevent her from lashing out at others.  Patient vocalized her understanding.  Patient was advised to discontinue her marijuana use.  1. Bipolar affective disorder, current episode manic, current episode severity unspecified (HCC)  - ARIPiprazole (ABILIFY) 10 MG tablet; Take 1 tablet (10 mg total) by mouth daily. For mood control  Dispense: 30 tablet; Refill: 2 - escitalopram (LEXAPRO) 10 MG tablet; Take 1 tablet (10 mg total) by mouth daily. For depression  Dispense: 30 tablet; Refill: 2 - lamoTRIgine (LAMICTAL) 25 MG tablet; Take 2 tablets (50 mg total) by mouth daily. For mood stabilization  Dispense: 60 tablet; Refill: 1  2. Generalized anxiety disorder  - busPIRone (BUSPAR) 10 MG tablet; Take 1 tablet (10 mg total) by mouth 2 (two) times daily. For anxiety  Dispense: 60 tablet; Refill: 2 - escitalopram (LEXAPRO) 10 MG tablet; Take 1 tablet (10 mg total) by mouth daily. For depression  Dispense: 30 tablet; Refill: 2  3. PTSD (post-traumatic stress disorder)  - escitalopram (LEXAPRO) 10 MG tablet; Take 1 tablet (10 mg total) by mouth daily. For depression  Dispense: 30 tablet; Refill: 2 - prazosin (MINIPRESS) 1 MG capsule; Take 1 capsule (1 mg total) by mouth at bedtime. For nightmares  Dispense: 30 capsule; Refill: 1  4. 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 disorder  - trazodone (DESYREL) 300 MG tablet; Take 1 tablet (300 mg total) by mouth at bedtime. For sleep  Dispense: 30  tablet; Refill: 2  5. Cannabis use disorder, mild, abuse   6. Attention and concentration deficit  - atomoxetine (STRATTERA)  40 MG capsule; Take 1 capsule (40 mg total) by mouth daily.  Dispense: 30 capsule; Refill: 1  Patient to follow up in 6 weeks Provider spent a total of 20 minutes with the patient/reviewing patient's chart  Meta Hatchet, PA 08/20/2020, 7:29 PM

## 2020-09-16 ENCOUNTER — Other Ambulatory Visit: Payer: Self-pay

## 2020-09-16 ENCOUNTER — Ambulatory Visit (HOSPITAL_COMMUNITY)
Admission: EM | Admit: 2020-09-16 | Discharge: 2020-09-16 | Disposition: A | Discharge status: Other Acute Inpatient Hospital | Payer: Medicaid Other | Attending: Psychiatry | Admitting: Psychiatry

## 2020-09-16 ENCOUNTER — Encounter (HOSPITAL_COMMUNITY): Payer: Self-pay | Admitting: Behavioral Health

## 2020-09-16 ENCOUNTER — Inpatient Hospital Stay (HOSPITAL_COMMUNITY)
Admission: AD | Admit: 2020-09-16 | Discharge: 2020-09-19 | DRG: 885 | Disposition: A | Discharge status: Home / Self Care | Payer: Medicaid Other | Source: Intra-hospital | Attending: Behavioral Health | Admitting: Behavioral Health

## 2020-09-16 DIAGNOSIS — F3189 Other bipolar disorder: Secondary | ICD-10-CM

## 2020-09-16 DIAGNOSIS — Z6835 Body mass index (BMI) 35.0-35.9, adult: Secondary | ICD-10-CM

## 2020-09-16 DIAGNOSIS — F315 Bipolar disorder, current episode depressed, severe, with psychotic features: Principal | ICD-10-CM | POA: Diagnosis present

## 2020-09-16 DIAGNOSIS — Z91048 Other nonmedicinal substance allergy status: Secondary | ICD-10-CM

## 2020-09-16 DIAGNOSIS — F5105 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 disorder: Secondary | ICD-10-CM | POA: Diagnosis not present

## 2020-09-16 DIAGNOSIS — F4310 Post-traumatic stress disorder, unspecified: Secondary | ICD-10-CM | POA: Insufficient documentation

## 2020-09-16 DIAGNOSIS — F319 Bipolar disorder, unspecified: Secondary | ICD-10-CM | POA: Insufficient documentation

## 2020-09-16 DIAGNOSIS — Z9119 Patient's noncompliance with other medical treatment and regimen: Secondary | ICD-10-CM

## 2020-09-16 DIAGNOSIS — R45851 Suicidal ideations: Secondary | ICD-10-CM | POA: Insufficient documentation

## 2020-09-16 DIAGNOSIS — Z9151 Personal history of suicidal behavior: Secondary | ICD-10-CM | POA: Diagnosis not present

## 2020-09-16 DIAGNOSIS — F1490 Cocaine use, unspecified, uncomplicated: Secondary | ICD-10-CM | POA: Diagnosis present

## 2020-09-16 DIAGNOSIS — F29 Unspecified psychosis not due to a substance or known physiological condition: Secondary | ICD-10-CM | POA: Diagnosis not present

## 2020-09-16 DIAGNOSIS — Z79891 Long term (current) use of opiate analgesic: Secondary | ICD-10-CM

## 2020-09-16 DIAGNOSIS — F17210 Nicotine dependence, cigarettes, uncomplicated: Secondary | ICD-10-CM | POA: Insufficient documentation

## 2020-09-16 DIAGNOSIS — Z91011 Allergy to milk products: Secondary | ICD-10-CM

## 2020-09-16 DIAGNOSIS — Z20822 Contact with and (suspected) exposure to covid-19: Secondary | ICD-10-CM | POA: Diagnosis present

## 2020-09-16 DIAGNOSIS — Z5901 Sheltered homelessness: Secondary | ICD-10-CM | POA: Diagnosis not present

## 2020-09-16 DIAGNOSIS — F99 Mental disorder, not otherwise specified: Secondary | ICD-10-CM

## 2020-09-16 DIAGNOSIS — F411 Generalized anxiety disorder: Secondary | ICD-10-CM | POA: Diagnosis present

## 2020-09-16 DIAGNOSIS — R41840 Attention and concentration deficit: Secondary | ICD-10-CM | POA: Diagnosis not present

## 2020-09-16 DIAGNOSIS — R45850 Homicidal ideations: Secondary | ICD-10-CM | POA: Diagnosis present

## 2020-09-16 DIAGNOSIS — Z79899 Other long term (current) drug therapy: Secondary | ICD-10-CM

## 2020-09-16 DIAGNOSIS — F1210 Cannabis abuse, uncomplicated: Secondary | ICD-10-CM | POA: Insufficient documentation

## 2020-09-16 DIAGNOSIS — G4700 Insomnia, unspecified: Secondary | ICD-10-CM | POA: Diagnosis present

## 2020-09-16 DIAGNOSIS — F901 Attention-deficit hyperactivity disorder, predominantly hyperactive type: Secondary | ICD-10-CM

## 2020-09-16 DIAGNOSIS — Z9114 Patient's other noncompliance with medication regimen: Secondary | ICD-10-CM | POA: Insufficient documentation

## 2020-09-16 DIAGNOSIS — F1990 Other psychoactive substance use, unspecified, uncomplicated: Secondary | ICD-10-CM

## 2020-09-16 DIAGNOSIS — F909 Attention-deficit hyperactivity disorder, unspecified type: Secondary | ICD-10-CM | POA: Insufficient documentation

## 2020-09-16 LAB — URINALYSIS, ROUTINE W REFLEX MICROSCOPIC
Bilirubin Urine: NEGATIVE
Glucose, UA: NEGATIVE mg/dL
Hgb urine dipstick: NEGATIVE
Ketones, ur: NEGATIVE mg/dL
Leukocytes,Ua: NEGATIVE
Nitrite: NEGATIVE
Protein, ur: NEGATIVE mg/dL
Specific Gravity, Urine: 1.024 (ref 1.005–1.030)
pH: 6 (ref 5.0–8.0)

## 2020-09-16 LAB — POCT URINE DRUG SCREEN - MANUAL ENTRY (I-SCREEN)
POC Amphetamine UR: NOT DETECTED
POC Buprenorphine (BUP): NOT DETECTED
POC Cocaine UR: POSITIVE — AB
POC Marijuana UR: POSITIVE — AB
POC Methadone UR: NOT DETECTED
POC Methamphetamine UR: NOT DETECTED
POC Morphine: NOT DETECTED
POC Oxazepam (BZO): NOT DETECTED
POC Oxycodone UR: NOT DETECTED
POC Secobarbital (BAR): NOT DETECTED

## 2020-09-16 LAB — COMPREHENSIVE METABOLIC PANEL
ALT: 31 U/L (ref 0–44)
AST: 27 U/L (ref 15–41)
Albumin: 4 g/dL (ref 3.5–5.0)
Alkaline Phosphatase: 52 U/L (ref 38–126)
Anion gap: 6 (ref 5–15)
BUN: 11 mg/dL (ref 6–20)
CO2: 27 mmol/L (ref 22–32)
Calcium: 9.4 mg/dL (ref 8.9–10.3)
Chloride: 105 mmol/L (ref 98–111)
Creatinine, Ser: 0.82 mg/dL (ref 0.44–1.00)
GFR, Estimated: >60 mL/min (ref >60)
Glucose, Bld: 87 mg/dL (ref 70–99)
Potassium: 4.6 mmol/L (ref 3.5–5.1)
Sodium: 138 mmol/L (ref 135–145)
Total Bilirubin: 0.4 mg/dL (ref 0.3–1.2)
Total Protein: 6.6 g/dL (ref 6.5–8.1)

## 2020-09-16 LAB — HEMOGLOBIN A1C
Hgb A1c MFr Bld: 5.4 % (ref 4.8–5.6)
Mean Plasma Glucose: 108.28 mg/dL

## 2020-09-16 LAB — RESP PANEL BY RT-PCR (FLU A&B, COVID) ARPGX2
Influenza A by PCR: NEGATIVE
Influenza B by PCR: NEGATIVE
SARS Coronavirus 2 by RT PCR: NEGATIVE

## 2020-09-16 LAB — CBC WITH DIFFERENTIAL/PLATELET
Abs Immature Granulocytes: 0.03 10*3/uL (ref 0.00–0.07)
Basophils Absolute: 0 10*3/uL (ref 0.0–0.1)
Basophils Relative: 0 %
Eosinophils Absolute: 0.1 10*3/uL (ref 0.0–0.5)
Eosinophils Relative: 1 %
HCT: 44.1 % (ref 36.0–46.0)
Hemoglobin: 14.5 g/dL (ref 12.0–15.0)
Immature Granulocytes: 0 %
Lymphocytes Relative: 21 %
Lymphs Abs: 1.5 10*3/uL (ref 0.7–4.0)
MCH: 31.3 pg (ref 26.0–34.0)
MCHC: 32.9 g/dL (ref 30.0–36.0)
MCV: 95.2 fL (ref 80.0–100.0)
Monocytes Absolute: 0.4 10*3/uL (ref 0.1–1.0)
Monocytes Relative: 6 %
Neutro Abs: 5.2 10*3/uL (ref 1.7–7.7)
Neutrophils Relative %: 72 %
Platelets: 220 10*3/uL (ref 150–400)
RBC: 4.63 MIL/uL (ref 3.87–5.11)
RDW: 13.3 % (ref 11.5–15.5)
WBC: 7.3 10*3/uL (ref 4.0–10.5)
nRBC: 0 % (ref 0.0–0.2)

## 2020-09-16 LAB — POC SARS CORONAVIRUS 2 AG: SARSCOV2ONAVIRUS 2 AG: NEGATIVE

## 2020-09-16 LAB — PREGNANCY, URINE: Preg Test, Ur: NEGATIVE

## 2020-09-16 LAB — POCT PREGNANCY, URINE: Preg Test, Ur: NEGATIVE

## 2020-09-16 LAB — TSH: TSH: 0.477 u[IU]/mL (ref 0.350–4.500)

## 2020-09-16 LAB — POC SARS CORONAVIRUS 2 AG -  ED: SARS Coronavirus 2 Ag: NEGATIVE

## 2020-09-16 LAB — ETHANOL: Alcohol, Ethyl (B): <10 mg/dL (ref <10)

## 2020-09-16 MED ORDER — ALUM & MAG HYDROXIDE-SIMETH 200-200-20 MG/5ML PO SUSP: 30.0000 mL | ORAL | Status: DC | PRN

## 2020-09-16 MED ORDER — ARIPIPRAZOLE 10 MG PO TABS
10.0000 mg | ORAL_TABLET | Freq: Every day | ORAL | Status: DC
  Administered 2020-09-17 – 2020-09-19 (×3): 10 mg via ORAL
  Filled 2020-09-16 (×5): qty 1

## 2020-09-16 MED ORDER — LAMOTRIGINE 25 MG PO TABS
25.0000 mg | ORAL_TABLET | Freq: Every day | ORAL | Status: DC
  Administered 2020-09-17 – 2020-09-18 (×2): 25 mg via ORAL
  Filled 2020-09-16 (×3): qty 1

## 2020-09-16 MED ORDER — ESCITALOPRAM OXALATE 10 MG PO TABS
10.0000 mg | ORAL_TABLET | Freq: Every day | ORAL | Status: DC
  Administered 2020-09-17 – 2020-09-19 (×3): 10 mg via ORAL
  Filled 2020-09-16 (×5): qty 1

## 2020-09-16 MED ORDER — MAGNESIUM HYDROXIDE 400 MG/5ML PO SUSP: 30.0000 mL | Freq: Every day | ORAL | Status: DC | PRN

## 2020-09-16 MED ORDER — TRAZODONE HCL 150 MG PO TABS: 300.0000 mg | ORAL_TABLET | Freq: Every day | ORAL | Status: DC

## 2020-09-16 MED ORDER — TRAZODONE HCL 150 MG PO TABS
300.0000 mg | ORAL_TABLET | Freq: Every day | ORAL | Status: DC
  Administered 2020-09-16 – 2020-09-18 (×3): 300 mg via ORAL
  Filled 2020-09-16 (×6): qty 2

## 2020-09-16 MED ORDER — NICOTINE 14 MG/24HR TD PT24
14.0000 mg | MEDICATED_PATCH | Freq: Once | TRANSDERMAL | Status: DC
  Administered 2020-09-16: 14 mg via TRANSDERMAL
  Filled 2020-09-16: qty 1

## 2020-09-16 MED ORDER — BUSPIRONE HCL 10 MG PO TABS
10.0000 mg | ORAL_TABLET | Freq: Two times a day (BID) | ORAL | Status: DC
  Administered 2020-09-16 – 2020-09-19 (×6): 10 mg via ORAL
  Filled 2020-09-16 (×6): qty 1
  Filled 2020-09-16: qty 2
  Filled 2020-09-16 (×4): qty 1

## 2020-09-16 MED ORDER — ALBUTEROL SULFATE HFA 108 (90 BASE) MCG/ACT IN AERS: 1.0000 | INHALATION_SPRAY | Freq: Four times a day (QID) | RESPIRATORY_TRACT | Status: DC | PRN

## 2020-09-16 MED ORDER — ACETAMINOPHEN 325 MG PO TABS
650.0000 mg | ORAL_TABLET | Freq: Four times a day (QID) | ORAL | Status: DC | PRN
  Administered 2020-09-18: 650 mg via ORAL
  Filled 2020-09-16: qty 2

## 2020-09-16 MED ORDER — LORATADINE 10 MG PO TABS
10.0000 mg | ORAL_TABLET | Freq: Every day | ORAL | Status: DC
  Administered 2020-09-17 – 2020-09-19 (×3): 10 mg via ORAL
  Filled 2020-09-16 (×5): qty 1

## 2020-09-16 MED ORDER — PRAZOSIN HCL 1 MG PO CAPS
1.0000 mg | ORAL_CAPSULE | Freq: Every day | ORAL | Status: DC
  Administered 2020-09-16 – 2020-09-18 (×3): 1 mg via ORAL
  Filled 2020-09-16 (×6): qty 1

## 2020-09-16 MED ORDER — NICOTINE 14 MG/24HR TD PT24
14.0000 mg | MEDICATED_PATCH | Freq: Every day | TRANSDERMAL | Status: DC
  Administered 2020-09-17: 14 mg via TRANSDERMAL
  Filled 2020-09-16 (×5): qty 1

## 2020-09-16 NOTE — Progress Notes (Signed)
Adult Psychoeducational Group Note  Date:  09/16/2020 Time:  10:38 PM  Group Topic/Focus:  Wrap-Up Group: The focus of this group is to help patients review their daily goal of treatment and discuss progress on daily workbooks.  Participation Level:  Did Not Attend  Participation Quality: Not Applicable  Affect: Not Applicable  Cognitive: Not Applicable  Insight: None  Engagement in Group: Not Applicable  Modes of Intervention: Not Applicable  Additional Comments: Pt did not attend evening wrap up group tonight.   Nicoletta Dress 09/16/2020, 10:38 PM

## 2020-09-16 NOTE — Tx Team (Signed)
Initial Treatment Plan 09/16/2020 7:14 PM [name] UMP:[id number]    PATIENT STRESSORS: Medication change or noncompliance   PATIENT STRENGTHS: Ability for insight   PATIENT IDENTIFIED PROBLEMS: Stopped taking psych meds  Relapse on cocaine and THC  Recent history of SI with plan                 DISCHARGE CRITERIA:  Improved stabilization in mood, thinking, and/or behavior  PRELIMINARY DISCHARGE PLAN: Attend aftercare/continuing care group  PATIENT/FAMILY INVOLVEMENT: This treatment plan has been presented to and reviewed with the patient, [name], and/or family member.  The patient and family have been given the opportunity to ask questions and make suggestions.  Ginger Carne, RN 09/16/2020, 7:14 PM

## 2020-09-16 NOTE — Progress Notes (Signed)
Pt  passive SI / AVH- command but contracts for safety. Pt stated she was off her meds for 1.5 months but started them back Saturday. Pt stated she now knows she needs to stay on her medication. Pt stated she needs to be out by Friday because of her Grandfather's wedding, her son is in it. Pt stated she wanted to sign 72 hr request for D/C. Pt educated on the process and encouraged to talk to the doctor and if she did not get the answers she wanted to sign the 72 hr req for D/C. Pt stated she relapsed on cocaine Thursday .     09/16/20 2300  Psych Admission Type (Psych Patients Only)  Admission Status Voluntary  Psychosocial Assessment  Patient Complaints Anxiety;Worrying  Eye Contact Fair  Facial Expression Sullen  Affect Sullen  Speech Logical/coherent  Interaction Assertive  Motor Activity Other (Comment) (WNL)  Appearance/Hygiene Unremarkable  Behavior Characteristics Cooperative  Mood Depressed;Preoccupied  Thought Process  Coherency WDL  Content WDL  Delusions None reported or observed  Perception WDL  Hallucination None reported or observed  Judgment Poor  Confusion None  Danger to Self  Current suicidal ideation? Denies  Self-Injurious Behavior Self-injurious ideation verbalized  Agreement Not to Harm Self Yes  Description of Agreement Pt verbally contracts for safety  Danger to Others  Danger to Others None reported or observed

## 2020-09-16 NOTE — Progress Notes (Addendum)
Patient information has been sent to [facility] via secure chat to review for potential admission. Patient has not yet been accepted at this time. Patient meets inpatient criteria per Park Pope, MD. Awaiting for labs to result.   Situation ongoing, CSW will continue to monitor and update note as more information becomes available.    Signed:  Corky Crafts, MSW, [place], LCASA 09/16/2020 2:19 PM

## 2020-09-16 NOTE — Progress Notes (Signed)
Admission Note:   Pt is alert and oriented to person, place, time and situation, is calm, cooperative, pleasant, soft spoken. Pt arrived from [facility] on a voluntary basis. Pt reports she told a Child psychotherapist, outpt, at a place called, "[employer]," that she has SI with plan to jump off a building and it was recommended she be evaluated at [facility]. Pt UDS positive for cocaine and THC, denies HI/AVH. Pt reports she stopped taking her psych meds, and relapsed which are her triggers for admission. At time of admission, pt denies SI. Pt's non contact, skin assessment and contraband search was completed by Meriam Sprague, Charity fundraiser and witnessed by this Clinical research associate. Pt reports she lives with her wife and her 20 year old son in an apt, and her son is with pt's mother while she is in the hospital. Pt reports she was previously admitted to the [facility] adult unit in the past. Pt verbally contracts for safety. Pt given orientation to unit routines and her room. Will continue to monitor pt per Q15 minute face checks and monitor for safety and progress.

## 2020-09-16 NOTE — Progress Notes (Signed)
   09/16/20 1120  [facility] Triage Screening (Walk-ins at [facility] only)  What Is the Reason for Your Visit/Call Today? 20 year old present [facility] accompanied by her social worker [name] with [employer]. Patient reports SI the last 3x days, HI the past month, visual hallucination and auditory hallucinations that are triggering the suicidal ideations. Patient has history of Bipolar, PTSD, Anxiety, ADHD, and Depression. Report relapsed on cocaine last Friday after been clean for 78 days. History of SI attempts 6x and report been hospitalized at least 30 times. Report emotional irregularly mood swings triggered triggered by auditory hallucinations. Patient report internal/external stimuli affects her mood. Report not taking medication as prescribed for a least month and half. Patient is seen upstrairs by [name], [place]. Patient report homicidal thoughts towards her cousin and men from her past. Patient has hx of voilent bx.  How Long Has This Been Causing You Problems? <Week  Have You Recently Had Any Thoughts About Hurting Yourself? Yes  How long ago did you have thoughts about hurting yourself? Patient report suicidal ideations the past 3 days with a plan to jump off of the bridge or overdose.  Are You Planning to Commit Suicide/Harm Yourself At This time? Yes  Have you Recently Had Thoughts About Hurting Someone [name]? Yes  How long ago did you have thoughts of harming others? report homicidal ideations towards her cousins and past men she has slept with  Are You Planning To Harm Someone At This Time? No  Are you currently experiencing any auditory, visual or other hallucinations? Yes  Please explain the hallucinations you are currently experiencing: report auditory hallucinations degrading her and telling her to hurt herself and others.  Have You Used Any Alcohol or Drugs in the Past 24 Hours? Yes  How long ago did you use Drugs or Alcohol? Report last use of cocaine last Friday 8-ball  What Did You Use and How  Much? 8 ball  Do you have any current medical co-morbidities that require immediate attention? No  What Do You Feel Would Help You the Most Today? Stress Management;Medication(s)  If access to [facility] was not available, would you have sought care in the Emergency Department? Yes  Determination of Need Emergent (2 hours)  Options For Referral Medication Management;Inpatient Hospitalization
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
## 2020-09-16 NOTE — ED Notes (Signed)
Pt asleep with even and unlabored respirations. No distress or discomfort noted. Pt remains safe on the unit. Will continue to monitor. 

## 2020-09-16 NOTE — Discharge Instructions (Addendum)
Transfer to [facility] for psychiatric stabilization

## 2020-09-16 NOTE — ED Notes (Signed)
Pt alert and oriented x4. Pt denies HI and VH. Education, support, reassurance, and encouragement provided,. Pt's belongings secured in locker. Pt denies any concerns at this time, and verbally contracts for safety. Pt ambulating on the unit with no issues. Pt remains safe on the unit.

## 2020-09-16 NOTE — ED Provider Notes (Signed)
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 female with history of bipolar disorder with psychosis, GAD, PTSD, insomnia, cannabis use disorder, substance use disorder-severe, ADHD presents to the [facility] due to 3 days of command hallucinations telling her to kill herself.  Patient has been noncompliant with medications for the past month and a half and has found that she was starting to have worsening bipolar symptoms including racing thoughts and "loud voices telling her negative thoughts".  Patient states that some of the auditory hallucinations include "your a horrible mom. Die, your horrible" and instructing her to jump off a parking deck or run into traffic or setting houses on fire.  Patient did walk into the middle of the road after church as she stated that the voices were very loud and overwhelming when she is in a busy environment.  Patient also states that for the past several days she has been blacking out for 5 to 10 minutes and has been aggressive towards her wife. Patient does not recall the blackout events. Patient has not been violent towards other people but she states this is because she has not blacked out in front of them yet.  Patient states that she had been self-medicating with marijuana she reduce her anxiety while she was noncompliant with medications, approximately 3-4 blunts a day.  Patient eats and sleeps regularly but has a nocturnal sleep-wake cycle at this time.  Patient stated that last Thursday  and Friday she had relapsed on cocaine which was a very impulsive decision on her friend about cocaine to patient's house.  Patient states she regularly smokes tobacco half a pack per day.  When asked why patient decided to stop taking her medications, she states "that is a great question".  Patient states that she has resumed her regular medication regiment starting Sunday after she relapsed on cocaine.  Patient last saw [name] July 27 per chart review. Patient has had multiple psychiatric hospitalizations, the most recent one being in February at [facility] for suicidal ideation.  Patient currently lives with wife and son.  Patient was originally ambivalent about inpatient psychiatric admission as she has grandfathers wedding to attend to in 3 days.  Patient is agreeable for inpatient as she is presently unable to contract for safety and still is having auditory commands hallucinations.   Psychiatric Specialty Exam  Presentation  General Appearance:Appropriate for Environment  Eye Contact:Fair  Speech:Clear and Coherent  Speech Volume:Normal  Handedness:Right   Mood and Affect  Mood:Anxious  Affect:Congruent   Thought Process  Thought Processes:Coherent  Descriptions of Associations:Intact  Orientation:Full (Time, Place and Person)  Thought Content:WDL  Diagnosis of Schizophrenia or Schizoaffective disorder in past: No  Duration of Psychotic Symptoms: Greater than six months  Hallucinations:Auditory; Command AH telling pt to jump off parking deck and run into traffic "Loud voices telling me bad thoughts" "I see people that I know is not real since I was a child."  Ideas of Reference:None  Suicidal Thoughts:Yes, Passive With Means to Carry Out; With Intent  Homicidal Thoughts:Yes, Passive Without Intent; Without Plan   Sensorium  Memory:Immediate  Fair; Recent Fair; Remote Fair  Judgment:Intact  Insight:Fair   Executive Functions   Concentration:Fair  Attention Span:Fair  Recall:Fair  [employer] of Knowledge:Fair  Language:Fair   Psychomotor Activity  Psychomotor Activity:Normal   Assets  Assets:Communication Skills; Desire for Improvement; Resilience; Social Support; Intimacy; Leisure Time   Sleep  Sleep:Fair  Number of hours:  No data recorded  No data recorded  Physical Exam: Physical Exam Vitals and nursing note reviewed.  Constitutional:      General: She is not in acute distress.    Appearance: She is well-developed.  HENT:     Head: Normocephalic and atraumatic.  Eyes:     Conjunctiva/sclera: Conjunctivae normal.  Cardiovascular:     Rate and Rhythm: Normal rate and regular rhythm.     Heart sounds: No murmur heard. Pulmonary:     Effort: Pulmonary effort is normal. No respiratory distress.     Breath sounds: Normal breath sounds.  Abdominal:     Palpations: Abdomen is soft.     Tenderness: There is no abdominal tenderness.  Musculoskeletal:     Cervical [name]: Neck supple.  Skin:    General: Skin is warm and dry.  Neurological:     Mental Status: She is alert.   Review of Systems  Constitutional:  Negative for chills and fever.  Respiratory:  Negative for cough, shortness of breath and wheezing.   Cardiovascular:  Negative for chest pain and palpitations.  Gastrointestinal:  Negative for abdominal pain, nausea and vomiting.  Skin:  Negative for itching and rash.  Neurological:  Negative for dizziness and headaches.  Blood pressure 138/90, pulse 95, temperature 97.9 F (36.6 C), temperature source Oral, resp. rate 16. There is no height or weight on file to calculate BMI.  Musculoskeletal: Strength & Muscle Tone: within normal limits Gait & Station: normal Patient leans: Front   [facility] M[facility] Discharge Disposition for Follow up and Recommendations: Based on my evaluation I certify that psychiatric inpatient services furnished can reasonably be expected to improve the patient's  condition which I recommend transfer to an appropriate accepting facility.   Recommend inpatient psychiatry hospitalization for psychiatric stabilization. Pt lab orders pending.    Park Pope, MD 09/16/2020, 2:06 PM

## 2020-09-16 NOTE — ED Notes (Signed)
Discharge instructions provided and Pt stated understanding. Pt alert, orient and ambulatory prior to d/c from facility. Personal belongings returned from the orange locker. Safe transport called for transportation services. Pt escorted to the sally port. Safety maintained.  

## 2020-09-16 NOTE — ED Notes (Signed)
Covid test, urine collection and blood specimens collected x1 stick via Left ac, tolerated well. Called transportation to collect specimens and was collected shortly after being called. Pt escorted to observation unit after skin assessment performed x2 nurses, tolerated well. Safety maintained. Meal provided.

## 2020-09-16 NOTE — ED Notes (Signed)
Report called to [facility] at [facility]. Safe transport called for transportation services.

## 2020-09-16 NOTE — Progress Notes (Signed)
Pt accepted to [facility] rm 303-1   Patient meets inpatient criteria per Park Pope, MD  Dr. Mason Jim is the attending provider.    Call report to [phone]    Dossie Arbour, RN @ [facility] notified in person.      Pt scheduled  to arrive at [facility] at ASAP  Patient has signed voluntary consent; to be transported with patient.    Signed:  Corky Crafts, MSW, [place], LCASA 09/16/2020 4:22 PM

## 2020-09-17 DIAGNOSIS — F315 Bipolar disorder, current episode depressed, severe, with psychotic features: Principal | ICD-10-CM

## 2020-09-17 DIAGNOSIS — R41840 Attention and concentration deficit: Secondary | ICD-10-CM | POA: Diagnosis not present

## 2020-09-17 DIAGNOSIS — F4310 Post-traumatic stress disorder, unspecified: Secondary | ICD-10-CM | POA: Diagnosis not present

## 2020-09-17 DIAGNOSIS — F99 Mental disorder, not otherwise specified: Secondary | ICD-10-CM

## 2020-09-17 DIAGNOSIS — F5105 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 disorder: Secondary | ICD-10-CM

## 2020-09-17 LAB — LIPID PANEL
Cholesterol: 191 mg/dL (ref 0–200)
Cholesterol: 175 mg/dL (ref 0–200)
HDL: 62 mg/dL (ref >40)
HDL: 55 mg/dL (ref >40)
LDL Cholesterol: 125 mg/dL — ABNORMAL HIGH (ref 0–99)
LDL Cholesterol: 112 mg/dL — ABNORMAL HIGH (ref 0–99)
Total CHOL/HDL Ratio: 3.1 RATIO
Total CHOL/HDL Ratio: 3.2 RATIO
Triglycerides: 20 mg/dL (ref <150)
Triglycerides: 42 mg/dL (ref <150)
VLDL: 4 mg/dL (ref 0–40)
VLDL: 8 mg/dL (ref 0–40)

## 2020-09-17 NOTE — [facility] Group Notes (Signed)
[facility] Group Notes:  (Nursing/MHT/Case Management/Adjunct)  Date:  09/17/2020  Time:  9:49 AM  Type of Therapy: Goals Group. Pts were asked to talk about their daily and long term goals Group Therapy  Participation Level:  Active  Participation Quality:  Appropriate  Affect:  Excited  Cognitive:  Appropriate  Insight:  Good  Engagement in Group:  Engaged  Modes of Intervention:  Discussion  Summary of Progress/Problems: Pt stated Their daily goal is to talk to the doctor about discharge. Their long term goal is to eventually open group homes.  Ames Coupe 09/17/2020, 9:49 AM

## 2020-09-17 NOTE — Tx Team (Signed)
Interdisciplinary Treatment and Diagnostic Plan Update  09/17/2020 Time of Session: 9:05am  [name] MRN: [id number]  Principal Diagnosis: Bipolar affective disorder, depressed, severe, with psychotic behavior ([place])  Secondary Diagnoses: Principal Problem:   Bipolar affective disorder, depressed, severe, with psychotic behavior ([place]) Active Problems:   PTSD (post-traumatic stress disorder)   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 disorder   Attention and concentration deficit   Current Medications:  Current Facility-Administered Medications  Medication Dose Route Frequency Provider Last Rate Last Admin   acetaminophen (TYLENOL) tablet 650 mg  650 mg Oral Q6H PRN France Ravens, MD       albuterol (VENTOLIN HFA) 108 (90 Base) MCG/ACT inhaler 1 puff  1 puff Inhalation Q6H PRN Prescilla Sours, PA-C       alum & mag hydroxide-simeth (MAALOX/MYLANTA) 200-200-20 MG/5ML suspension 30 mL  30 mL Oral Q4H PRN France Ravens, MD       ARIPiprazole (ABILIFY) tablet 10 mg  10 mg Oral Daily Margorie [name] W, PA-C   10 mg at 09/17/20 0931   busPIRone (BUSPAR) tablet 10 mg  10 mg Oral BID Prescilla Sours, PA-C   10 mg at 09/17/20 0931   escitalopram (LEXAPRO) tablet 10 mg  10 mg Oral Daily Margorie [name] W, PA-C   10 mg at 09/17/20 0931   lamoTRIgine (LAMICTAL) tablet 25 mg  25 mg Oral Daily Margorie [name] W, PA-C   25 mg at 09/17/20 0931   loratadine (CLARITIN) tablet 10 mg  10 mg Oral Daily Margorie [name] W, PA-C   10 mg at 09/17/20 0930   magnesium hydroxide (MILK OF MAGNESIA) suspension 30 mL  30 mL Oral Daily PRN France Ravens, MD       nicotine (NICODERM CQ - dosed in mg/24 hours) patch 14 mg  14 mg Transdermal Daily Margorie [name] W, PA-C   14 mg at 09/17/20 0931   prazosin (MINIPRESS) capsule 1 mg  1 mg Oral QHS Margorie [name] W, PA-C   1 mg at 09/16/20 2146   traZODone (DESYREL) tablet 300 mg  300 mg Oral QHS Arthor Captain, MD   300 mg at 09/16/20 2145   PTA Medications: Medications Prior to Admission  Medication Sig  Dispense Refill Last Dose   ARIPiprazole (ABILIFY) 10 MG tablet Take 1 tablet (10 mg total) by mouth daily. For mood control (Patient taking differently: Take 10 mg by mouth daily. For mood control. Patient reports that she had stopped taking this medication for 1.5 months and resumed taking it regularly on Saturday, 09/13/20 and has been taking it regularly as prescribed since 09/13/20.) 30 tablet 2 09/16/2020   atomoxetine (STRATTERA) 40 MG capsule Take 1 capsule (40 mg total) by mouth daily. (Patient taking differently: Take 40 mg by mouth daily. Patient reports that she had stopped taking this medication for 1.5 months and resumed taking it regularly on Saturday, 09/13/20 and has been taking it regularly as prescribed since 09/13/20.) 30 capsule 1 09/16/2020   busPIRone (BUSPAR) 10 MG tablet Take 1 tablet (10 mg total) by mouth 2 (two) times daily. For anxiety (Patient taking differently: Take 10 mg by mouth 2 (two) times daily. For anxiety. Patient reports that she had stopped taking this medication for 1.5 months and resumed taking it regularly on Saturday, 09/13/20 and has been taking it regularly as prescribed since 09/13/20. She states she has not had her second dose of this medication yet today (09/16/20).) 60 tablet 2 09/16/2020   escitalopram (LEXAPRO) 10 MG tablet  Take 1 tablet (10 mg total) by mouth daily. For depression (Patient taking differently: Take 10 mg by mouth daily. For depression. Patient reports that she had stopped taking this medication for 1.5 months and resumed taking it regularly on Saturday, 09/13/20 and has been taking it regularly as prescribed since 09/13/20.) 30 tablet 2 09/16/2020   lamoTRIgine (LAMICTAL) 25 MG tablet Take 2 tablets (50 mg total) by mouth daily. For mood stabilization (Patient taking differently: Take 50 mg by mouth daily. For mood stabilization. Patient reports that she had stopped taking this medication for 1.5 months and resumed taking it regularly on Saturday,  09/13/20 and has been taking it regularly as prescribed since 09/13/20.) 60 tablet 1 09/16/2020   prazosin (MINIPRESS) 1 MG capsule Take 1 capsule (1 mg total) by mouth at bedtime. For nightmares (Patient taking differently: Take 1 mg by mouth at bedtime. For nightmares. Patient reports that she had stopped taking this medication for 1.5 months and resumed taking it regularly on Saturday, 09/13/20 and has been taking it regularly as prescribed since 09/13/20. She states that she has not taken this medication yet today (09/16/20).) 30 capsule 1 09/15/2020   trazodone (DESYREL) 300 MG tablet Take 1 tablet (300 mg total) by mouth at bedtime. For sleep (Patient taking differently: Take 300 mg by mouth at bedtime. For sleep. Patient reports that she had stopped taking this medication for 1.5 months and resumed taking it regularly on Saturday, 09/13/20 and has been taking it regularly as prescribed since 09/13/20. She states that she has not taken this medication yet today (09/16/20).) 30 tablet 2 09/15/2020   albuterol (VENTOLIN HFA) 108 (90 Base) MCG/ACT inhaler Inhale 1 puff into the lungs every 6 (six) hours as needed for wheezing or shortness of breath. 18 g 2    cetirizine (ZYRTEC) 10 MG tablet Take 1 tablet (10 mg total) by mouth daily. 30 tablet 11     Patient Stressors: Medication change or noncompliance  Patient Strengths: Ability for insight  Treatment Modalities: Medication Management, Group therapy, Case management,  1 to 1 session with clinician, Psychoeducation, Recreational therapy.   Physician Treatment Plan for Primary Diagnosis: Bipolar affective disorder, depressed, severe, with psychotic behavior ([place]) Long Term Goal(s): Improvement in symptoms so as ready for discharge   Short Term Goals: Ability to identify and develop effective coping behaviors will improve Compliance with prescribed medications will improve Ability to identify triggers associated with substance abuse/mental health issues  will improve Ability to identify changes in lifestyle to reduce recurrence of condition will improve Ability to verbalize feelings will improve Ability to disclose and discuss suicidal ideas Ability to demonstrate self-control will improve  Medication Management: Evaluate patient's response, side effects, and tolerance of medication regimen.  Therapeutic Interventions: 1 to 1 sessions, Unit Group sessions and Medication administration.  Evaluation of Outcomes: Not Met  Physician Treatment Plan for Secondary Diagnosis: Principal Problem:   Bipolar affective disorder, depressed, severe, with psychotic behavior ([place]) Active Problems:   PTSD (post-traumatic stress disorder)   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 disorder   Attention and concentration deficit  Long Term Goal(s): Improvement in symptoms so as ready for discharge   Short Term Goals: Ability to identify and develop effective coping behaviors will improve Compliance with prescribed medications will improve Ability to identify triggers associated with substance abuse/mental health issues will improve Ability to identify changes in lifestyle to reduce recurrence of condition will improve Ability to verbalize feelings will improve Ability to disclose and discuss suicidal  ideas Ability to demonstrate self-control will improve     Medication Management: Evaluate patient's response, side effects, and tolerance of medication regimen.  Therapeutic Interventions: 1 to 1 sessions, Unit Group sessions and Medication administration.  Evaluation of Outcomes: Not Met   RN Treatment Plan for Primary Diagnosis: Bipolar affective disorder, depressed, severe, with psychotic behavior ([place]) Long Term Goal(s): Knowledge of disease and therapeutic regimen to maintain health will improve  Short Term Goals: Ability to remain free from injury will improve, Ability to participate in decision making will improve, Ability to verbalize feelings will  improve, Ability to disclose and discuss suicidal ideas, and Ability to identify and develop effective coping behaviors will improve  Medication Management: RN will administer medications as ordered by provider, will assess and evaluate patient's response and provide education to patient for prescribed medication. RN will report any adverse and/or side effects to prescribing provider.  Therapeutic Interventions: 1 on 1 counseling sessions, Psychoeducation, Medication administration, Evaluate responses to treatment, Monitor vital signs and CBGs as ordered, Perform/monitor CIWA, COWS, AIMS and Fall Risk screenings as ordered, Perform wound care treatments as ordered.  Evaluation of Outcomes: Not Met   LCSW Treatment Plan for Primary Diagnosis: Bipolar affective disorder, depressed, severe, with psychotic behavior ([place]) Long Term Goal(s): Safe transition to appropriate next level of care at discharge, Engage patient in therapeutic group addressing interpersonal concerns.  Short Term Goals: Engage patient in aftercare planning with referrals and resources, Increase social support, Increase emotional regulation, Facilitate acceptance of mental health diagnosis and concerns, Identify triggers associated with mental health/substance abuse issues, and Increase skills for wellness and recovery  Therapeutic Interventions: Assess for all discharge needs, 1 to 1 time with Social worker, Explore available resources and support systems, Assess for adequacy in community support network, Educate family and significant other(s) on suicide prevention, Complete Psychosocial Assessment, Interpersonal group therapy.  Evaluation of Outcomes: Not Met   Progress in Treatment: Attending groups: Yes. Participating in groups: Yes. Taking medication as prescribed: Yes. Toleration medication: Yes. Family/Significant other contact made: Yes, individual(s) contacted:  If consents are provided  Patient understands  diagnosis: Yes. and No. Discussing patient identified problems/goals with staff: Yes. Medical problems stabilized or resolved: Yes. Denies suicidal/homicidal ideation: Yes. Issues/concerns per patient self-inventory: No.   New problem(s) identified: No, Describe:  None   New Short Term/Long Term Goal(s): medication stabilization, elimination of SI thoughts, development of comprehensive mental wellness plan.   Patient Goals: "To go home"   Discharge Plan or Barriers: Patient recently admitted. CSW will continue to follow and assess for appropriate referrals and possible discharge planning.   Reason for Continuation of Hospitalization: Anxiety Hallucinations Medication stabilization  Estimated Length of Stay: 3 to 5 days   Attendees: Patient: [name] 09/17/2020   Physician: Lestine Mount, DO 09/17/2020   Nursing:  09/17/2020   RN Care Manager: 09/17/2020   Social Worker: Verdis Frederickson, [place] 09/17/2020   Recreational Therapist:  09/17/2020  Other:  09/17/2020   Other:  09/17/2020   Other: 09/17/2020     Scribe for Treatment Team: Darleen Crocker, [place] 09/17/2020 12:33 PM

## 2020-09-17 NOTE — BHH Group Notes (Signed)
Type of Therapy and Topic:  Group Therapy - Healthy vs Unhealthy Coping Skills  Participation Level:  Active   Description of Group The focus of this group was to determine what unhealthy coping techniques typically are used by group members and what healthy coping techniques would be helpful in coping with various problems. Patients were guided in becoming aware of the differences between healthy and unhealthy coping techniques. Patients were asked to identify 2-3 healthy coping skills they would like to learn to use more effectively.  Therapeutic Goals 1. Patients learned that coping is what human beings do all day long to deal with various situations in their lives 2. Patients defined and discussed healthy vs unhealthy coping techniques 3. Patients identified their preferred coping techniques and identified whether these were healthy or unhealthy 4. Patients determined 2-3 healthy coping skills they would like to become more familiar with and use more often. 5. Patients provided support and ideas to each other   Summary of Patient Progress:  Pt was provided with a worksheet to complete and share with the group.  Pt accepted this worksheet and was appropriate.  Pt was encouraged to speak with the social worker if they had any questions or concerns.  
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
## 2020-09-17 NOTE — Progress Notes (Signed)
EKG completed and placed in front of chart.  

## 2020-09-17 NOTE — Progress Notes (Signed)
Adult Psychoeducational Group Note  Date:  09/17/2020 Time:  9:48 PM  Group Topic/Focus:  Wrap-Up Group:  The focus of this group is to help patients review their daily goal of treatment and discuss progress on daily workbooks.  Participation Level:  Active  Participation Quality:  Appropriate  Affect:  Appropriate  Cognitive:  Appropriate  Insight: Appropriate and Good  Engagement in Group:  Engaged and Supportive  Modes of Intervention:  Discussion  Additional Comments: The patient participated in NA Group on this date with volunteers on how to decrease dependence on relationships while beginning to meet their own needs, build confidence, and practice assertiveness. Volunteers facilitated a discussion with the group regarding triggers and identified emotional and situational factors that affect their desire to use--the end of the Wrap-Up progress note for the NA support group.       Nicoletta Dress 09/17/2020, 9:48 PM

## 2020-09-17 NOTE — Progress Notes (Signed)
Pt state she slept good and calm last night with scheduled trazodone 300 and minipress 1. Pt denies VH/HI. Pt endorses passive SI but verbal contracts for safety. Pt endorses AH stating "They are my friends and not bad". Pt was anxious but appropriate with interaction. Pt remains safe.

## 2020-09-17 NOTE — Progress Notes (Addendum)
   09/17/20 1426  Vital Signs  Pulse Rate (!) 103  Pulse Rate Source Dinamap  BP (!) 122/91  BP Location Left Arm  BP Method Automatic  Patient Position (if appropriate) Sitting   Pt states she feels nauseous, dizzy, and anxious. Vitals obtained. Pt was encourage to go lay down. Pt refused PRN medications. Encourage to push fluids; Pt was given ginger ale and pitcher of water. Will continue to monitor.

## 2020-09-17 NOTE — H&P (Signed)
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 Plan/Recommendations: 1. Admit for crisis management and stabilization, estimated length of stay 3-5 days.   2. Medication management to reduce current symptoms to base line and improve the patient's overall level of functioning: See [facility] for plan of care.  - Mood control/mood stabilization. Continue Abilify 10 mg po daily. Continue Lamictal 25 mg po daily.   -Depression.  Continue Lexapro 10 mg po daily.  -Anxiety.  Continue Lexapro 10 mg po daily. Continue Buspar 10 mg po bid.  -PTSD symptoms/nightmares.  Continue Minipress 1 mg po Q hs.  -Insomnia. Continue Trazodone 300 mg po Q hs .  -Other medical issues.  Continue albuterol inhaler 1 puff Q 6 hrs prn for SOB. Continue Loratadine  10 mg po daily for allergies. Continue routine prn medications for anxiety, pain, insomnia etc.  3. Treat health problems as indicated.  4. Develop treatment plan to decrease risk of relapse upon discharge and the need for readmission.  5. Psycho-social education regarding relapse prevention and self care.  6. Health care follow up as needed for medical problems.  7. Review, reconcile, and reinstate any pertinent home medications for other health issues where appropriate. 8. Call for consults with hospitalist for any additional specialty patient care services as needed.   Observation Level/Precautions:  15 minute checks  Laboratory:   Current lab results reviewed , UDS was (+) for cocaine/THC  Psychotherapy: Enrolled in/attend group sessions  Medications: See [facility] for medication lists.  Consultations: None at this time  Discharge Concerns: Safety, mood stabilization, maintaining sobriety.  Estimated LOS: 2-4 days  Other: Admit to the 300-hall.    Physician Treatment Plan for Primary Diagnosis: Bipolar affective disorder, depressed, severe, with psychotic behavior ([place])  Long Term Goal(s): Improvement in symptoms so as ready for discharge  Short Term Goals: Ability to identify changes in lifestyle to reduce recurrence of condition will improve, Ability to verbalize feelings will improve, Ability to disclose and discuss suicidal ideas, and Ability to demonstrate self-control will improve  Physician Treatment Plan for Secondary Diagnosis: Principal Problem:   Bipolar affective disorder, depressed, severe, with psychotic behavior ([place]) Active Problems:   PTSD (post-traumatic stress disorder)   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 disorder   Attention and concentration deficit  Long Term Goal(s): Improvement in symptoms so as ready for discharge  Short Term Goals: Ability to identify and develop effective coping behaviors will improve, Compliance with prescribed medications will improve, and  Ability to identify triggers associated with substance abuse/mental health issues will improve  I certify that inpatient services furnished can reasonably be expected to improve the patient's condition.    Lindell Spar, NP, pmhnp, fnp-bc 8/24/202211:15 AM

## 2020-09-17 NOTE — Progress Notes (Signed)
Recreation Therapy Notes  Date:  8.24.22 Time: 0930 Location: 300 Hall Dayroom  Group Topic: Stress Management  Goal Area(s) Addresses:  Patient will identify positive stress management techniques. Patient will identify benefits of using stress management post d/c.  Behavioral Response: Attentive  Intervention: Stress Management  Activity :  Meditation.  LRT played a meditation that focused on being resilient in the face of adversity and managing reactions to situations beyond your control.  Education:  Stress Management, Discharge Planning.   Education Outcome: Acknowledges Education  Clinical Observations/Feedback: Despite interruptions, pt was engaged during group.  Pt did agree with peers about the interruptions being disrespectful and made it had to fully concentrate.     Caroll Rancher, LRT/CTRS         Caroll Rancher A 09/17/2020 12:05 PM
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
## 2020-09-18 DIAGNOSIS — F315 Bipolar disorder, current episode depressed, severe, with psychotic features: Secondary | ICD-10-CM | POA: Diagnosis not present

## 2020-09-18 MED ORDER — LAMOTRIGINE 25 MG PO TABS
50.0000 mg | ORAL_TABLET | Freq: Every day | ORAL | Status: DC
  Administered 2020-09-19: 50 mg via ORAL
  Filled 2020-09-18 (×3): qty 2

## 2020-09-18 NOTE — Progress Notes (Signed)
Adult Psychoeducational Group Note  Date:  09/18/2020 Time:  5:39 PM  Group Topic/Focus:  Coping With Mental Health Crisis:   The purpose of this group is to help patients identify strategies for coping with mental health crisis.  Group discusses possible causes of crisis and ways to manage them effectively.  Participation Level:  Active  Participation Quality:  Appropriate  Affect:  Appropriate  Cognitive:  Appropriate  Insight: Appropriate  Engagement in Group:  Engaged  Modes of Intervention:  Discussion  Additional Comments:  Pt attended group and participated in discussion.  Sue Mcalexander R Thaila Bottoms 09/18/2020, 5:39 PM

## 2020-09-18 NOTE — Progress Notes (Signed)
D:  Patient's self inventory sheet, patient sleeps good, sleep medication helpful.  Fair appetite, low energy level, good concentration.  Rated depression 5, hopeless 2, anxiety 4.  Denied withdrawals.  Denied SI.  Physical problems, headaches, worst pain #4 in past 24 hours.  Goal is use 10 coping skills to manage depression and don't isolate.  Plans to not go in room, talk, make list.  Today is sad day.  Does have discharge plans. A:  Medications administered per MD orders.  Emotional support and encouragement given patient. R:  Denied SI and HI, contracts for safety.  Denied A/V hallucinations.  Safety maintained with 15 minute checks.

## 2020-09-18 NOTE — Plan of Care (Signed)
°  Problem: Education: °Goal: Emotional status will improve °Outcome: Progressing °Goal: Mental status will improve °Outcome: Progressing °  °Problem: Activity: °Goal: Interest or engagement in activities will improve °Outcome: Progressing °  °

## 2020-09-18 NOTE — Plan of Care (Signed)
Nurse discussed coping skills with patient.  

## 2020-09-18 NOTE — Progress Notes (Signed)
[facility]  09/18/2020 2:13 PM [name]  MRN:  [id number]  Subjective: [name] reports, "I'm upset right now. I need a moment to calm down. I just learned that my mother-inlaw is threatening to take my son away because she learned that I'm in the psychiatric hospital. I cannot lose my son. I think my mood is better, I need to be discharged today so I can go get my son".  Daily notes: [name] is seen, chart reviewed. The chart findings discussed with the treatment team. She is alert, oriented x 3 & aware of situation. She is visible on the unit, attending group sessions. However, she is seen right at this moment talking & yelling on the phone on the hall way. She presents tearful & upset. She says her mother-inlaw is threatening to take her son from her because the mother-inlaw learned that she is in the hospital. She states that her mother-inlaw is threatening to go down town to file custody papers against her to take her son away from her. She says the reason in part is because her mother-inlaw does not like her lifestyle (because she the patient has a wife). Patient is demanding to be discharged from the hospital right at this moment so she can go & get her son. Patient is instructed with support & encouragement that her being in the hospital, getting help for her mental health issues will not cause her to lose her son as she did not do anything wrong by being in the hospital. She is informed that her medications are being adjusted to meet her need so she can be discharged in a good frame of mind to go home & care for herself & her son. Patient did say that she is however feeling a lot better despite the fact that she is upset at this moment. Patient did calm & settled down eventually. She is taking & tolerating her treatment regimen. She denies any side effects. She currently denies any SIHI, VH, delusions or paranoia. She does not appear to be responding to any internal stimuli. [name] did reports  an auditory hallucination that she says has been going on since her childhood. She says this is a good voice that tells her positive things. She is in agreement to continue current plan of care as already in progress. She is scheduled to be discharged tomorrow morning. Her vital signs are stable.  Reason for admission: [name] reports, "I came back to the hospital because I was having some kind of crisis whereby I found myself dissociating. I decided to come to the hospital to get treatment to have some clarity. I was also having suicidal/homicidal ideations that were worsening. The thoughts got stronger & stronger, got me feeling concerned. I knew that I was not going to act on those thoughts.   Principal Problem: Bipolar affective disorder, depressed, severe, with psychotic behavior (HCC) Diagnosis: Principal Problem:   Bipolar affective disorder, depressed, severe, with psychotic behavior (HCC) Active Problems:   PTSD (post-traumatic stress disorder)   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 disorder   Attention and concentration deficit  Total Time spent with patient: 30 minutes  Past Psychiatric History: See H&P  Past Medical History:  Past Medical History:  Diagnosis Date   Anxiety    Asthma    Headache(784.0)    Hx of suicide attempt    Major depressive disorder    Morbid obesity (HCC) 03/25/2020   PTSD (post-traumatic stress disorder)     Past Surgical  History:  Procedure Laterality Date   wisdom tooth extraction     Family History:  Family History  Problem Relation Age of Onset   Diabetes Maternal Aunt    Diabetes Maternal Grandmother    Cancer Maternal Grandmother    Breast cancer Maternal Grandmother    Breast cancer Other    Family Psychiatric  History: See H&P  Social History:  Social History   Substance and Sexual Activity  Alcohol Use Yes   Comment: been drinking heavily last 2 weeks; a 1/5 of liquor a day     Social History   Substance and Sexual Activity  Drug  Use Yes   Types: Marijuana, MDMA (Ecstacy), Cocaine   Comment: Coc-last use 2/27; marijuana and MDMA last use 2/28    Social History   Socioeconomic History   Marital status: Married    Spouse name: Not on file   Number of children: 2   Years of education: Not on file   Highest education level: Some college, no degree  Occupational History   Not on file  Tobacco Use   Smoking status: Every Day    Packs/day: 0.50    Years: 4.00    Pack years: 2.00    Types: Cigarettes, Cigars   Smokeless tobacco: Never   Tobacco comments:    black and mild with THC  Substance and Sexual Activity   Alcohol use: Yes    Comment: been drinking heavily last 2 weeks; a 1/5 of liquor a day   Drug use: Yes    Types: Marijuana, MDMA (Ecstacy), Cocaine    Comment: Coc-last use 2/27; marijuana and MDMA last use 2/28   Sexual activity: Yes    Partners: Male    Birth control/protection: None, Injection  Other Topics Concern   Not on file  Social History Narrative   Pt and family are homeless. She, her wife and two kids live in her cousin's house   Social Determinants of Health   Financial Resource Strain: Not on file  Food Insecurity: Not on file  Transportation Needs: Not on file  Physical Activity: Not on file  Stress: Not on file  Social Connections: Not on file   Additional Social History:   Sleep: Good  Appetite:  Good  Current Medications: Current Facility-Administered Medications  Medication Dose Route Frequency Provider Last Rate Last Admin   acetaminophen (TYLENOL) tablet 650 mg  650 mg Oral Q6H PRN Park Pope, MD       albuterol (VENTOLIN HFA) 108 (90 Base) MCG/ACT inhaler 1 puff  1 puff Inhalation Q6H PRN Jaclyn Shaggy, PA-C       alum & mag hydroxide-simeth (MAALOX/MYLANTA) 200-200-20 MG/5ML suspension 30 mL  30 mL Oral Q4H PRN Park Pope, MD       ARIPiprazole (ABILIFY) tablet 10 mg  10 mg Oral Daily Melbourne Abts W, PA-C   10 mg at 09/18/20 0900   busPIRone (BUSPAR) tablet  10 mg  10 mg Oral BID Melbourne Abts W, PA-C   10 mg at 09/18/20 0900   escitalopram (LEXAPRO) tablet 10 mg  10 mg Oral Daily Melbourne Abts W, PA-C   10 mg at 09/18/20 0900   [START ON 09/19/2020] lamoTRIgine (LAMICTAL) tablet 50 mg  50 mg Oral Daily Armandina Stammer I, NP       loratadine (CLARITIN) tablet 10 mg  10 mg Oral Daily Melbourne Abts W, PA-C   10 mg at 09/18/20 0900   magnesium hydroxide (MILK OF MAGNESIA) suspension 30  mL  30 mL Oral Daily PRN Park Pope, MD       nicotine (NICODERM CQ - dosed in mg/24 hours) patch 14 mg  14 mg Transdermal Daily Melbourne Abts W, PA-C   14 mg at 09/17/20 9622   prazosin (MINIPRESS) capsule 1 mg  1 mg Oral QHS Melbourne Abts W, PA-C   1 mg at 09/17/20 2141   traZODone (DESYREL) tablet 300 mg  300 mg Oral QHS Claudie Revering, MD   300 mg at 09/17/20 2141   Lab Results:  Results for orders placed or performed during the hospital encounter of 09/16/20 (from the past 48 hour(s))  Lipid panel     Status: Abnormal   Collection Time: 09/17/20  6:10 PM  Result Value Ref Range   Cholesterol 175 0 - 200 mg/dL   Triglycerides 42 <297 mg/dL   HDL 55 >98 mg/dL   Total CHOL/HDL Ratio 3.2 RATIO   VLDL 8 0 - 40 mg/dL   LDL Cholesterol 921 (H) 0 - 99 mg/dL    Comment:        Total Cholesterol/HDL:CHD Risk Coronary Heart Disease Risk Table                     Men   Women  1/2 Average Risk   3.4   3.3  Average Risk       5.0   4.4  2 X Average Risk   9.6   7.1  3 X Average Risk  23.4   11.0        Use the calculated Patient Ratio above and the CHD Risk Table to determine the patient's CHD Risk.        ATP III CLASSIFICATION (LDL):  <100     mg/dL   Optimal  194-174  mg/dL   Near or Above                    Optimal  130-159  mg/dL   Borderline  081-448  mg/dL   High  >185     mg/dL   Very High Performed at [facility], [address] [place], [place] [address]    Blood Alcohol level:  Lab Results  Component Value Date   [facility] <10 09/16/2020    ETH <10 03/25/2020   Metabolic Disorder Labs: Lab Results  Component Value Date   HGBA1C 5.4 09/16/2020   MPG 108.28 09/16/2020   MPG 108.28 03/25/2020   Lab Results  Component Value Date   PROLACTIN 6.6 12/28/2018   PROLACTIN 5.8 08/23/2017   Lab Results  Component Value Date   CHOL 175 09/17/2020   TRIG 42 09/17/2020   HDL 55 09/17/2020   CHOLHDL 3.2 09/17/2020   VLDL 8 09/17/2020   LDLCALC 112 (H) 09/17/2020   LDLCALC 125 (H) 09/16/2020   Physical Findings: AIMS:  , ,  ,  ,    CIWA:    COWS:     Musculoskeletal: Strength & Muscle Tone: within normal limits Gait & Station: normal Patient leans: N/A  Psychiatric Specialty Exam:  Presentation  General Appearance: Appropriate for Environment; Casual; Fairly Groomed  Eye Contact:Good  Speech:Normal Rate  Speech Volume:Normal  Handedness:Right  Mood and Affect  Mood:Anxious; Dysphoric ("I just learned that my mother inlaw is threatening to take my son from me because I came to the hospital. I'm worried for my son. I cannot lose my son to this woman.".)  Affect:Tearful  Thought  Process  Thought Processes:Coherent; Goal Directed  Descriptions of Associations:Intact  Orientation:Full (Time, Place and Person)  Thought Content:Logical  History of Schizophrenia/Schizoaffective disorder:No  Duration of Psychotic Symptoms:Greater than six months  Hallucinations:Hallucinations: Auditory ("I have been hearing this voice since I was a kid. It is a positive thing".) Description of Command Hallucinations: NA Description of Auditory Hallucinations: "I have been hearing this friendly voice since I was a kid. It is a positive thing for me". Ideas of Reference:None  Suicidal Thoughts:Suicidal Thoughts: No SI Passive Intent and/or Plan: Without Intent; Without Plan; Without Means to Carry Out; Without Access to Means Homicidal Thoughts:Homicidal Thoughts: No HI Passive Intent and/or Plan: Without Intent; Without  Plan; Without Means to Carry Out; Without Access to Means  Sensorium  Memory:Immediate Good; Recent Good; Remote Good  Judgment:Fair  Insight:Fair  Executive Functions  Concentration:Fair  Attention Span:Fair  Recall:Good  Fund of Knowledge:Fair  Language:Fair  Psychomotor Activity  Psychomotor Activity: Psychomotor Activity: Normal  Assets  Assets:Communication Skills; Desire for Improvement; Housing; Physical Health; Social Support  Sleep  Sleep: Sleep: Good Number of Hours of Sleep: 6.5  Physical Exam: Physical Exam Vitals and nursing note reviewed.  HENT:     Head: Normocephalic.     Nose: Nose normal.     Mouth/Throat:     Pharynx: Oropharynx is clear.  Eyes:     Pupils: Pupils are equal, round, and reactive to light.  Cardiovascular:     Rate and Rhythm: Normal rate.     Pulses: Normal pulses.  Pulmonary:     Effort: Pulmonary effort is normal.  Genitourinary:    Comments: Deferred Musculoskeletal:        General: Normal range of motion.     Cervical back: Normal range of motion.  Skin:    General: Skin is warm and dry.  Neurological:     General: No focal deficit present.     Mental Status: She is alert and oriented to person, place, and time.   Review of Systems  Constitutional:  Negative for chills, diaphoresis and fever.  HENT:  Negative for congestion and sore throat.   Eyes:  Negative for blurred vision.  Respiratory:  Negative for cough, shortness of breath and wheezing.   Cardiovascular:  Negative for chest pain and palpitations.  Gastrointestinal:  Negative for abdominal pain, constipation, diarrhea, heartburn, nausea and vomiting.  Genitourinary: Negative.   Musculoskeletal:  Negative for joint pain and myalgias.  Skin: Negative.   Neurological:  Negative for dizziness, tingling, tremors, sensory change, speech change, focal weakness, seizures, loss of consciousness, weakness and headaches.  Endo/Heme/Allergies:        Allergies:  Tape,   Food Lactose intolerance.  Psychiatric/Behavioral:  Positive for depression ("Improving"), hallucinations (Auditory hallucinations (Pt says voice is (+)) and substance abuse (Hx. Cocaine/THC use disorder.). Negative for memory loss and suicidal ideas. The patient is not nervous/anxious and does not have insomnia.   Blood pressure 105/62, pulse 90, temperature 97.7 F (36.5 C), temperature source Oral, resp. rate 16, height 5\' 9"  (1.753 m), weight 109.8 kg, SpO2 100 %. Body mass index is 35.74 kg/m.  Treatment Plan Summary: Daily contact with patient to assess and evaluate symptoms and progress in treatment and Medication management.  Continue inpatient hospitalization. Will continue today 09/18/2020 plan as below except where it is noted.   Mood control. Continue Abilify 10 mg po daily. Increased Lamictal from 25 mg to 50 mg po daily.  Depression. Continue Lexapro 10 mg po daily.  Anxiety.  Continue Buspar 10 mg po bid.   PTSD.  Continue Minipress 1 mg po Q bedtime.   Insomnia.  Continue Trazodone 300 mg po Q bedtime.  Other medical issues: Continue. Albuterol inhaler 1 puff Q 6 hrs prn for SOB. Loratadine 10 mg po daily for allergies.  The routine prn medications for pain/fever, indigestion, nausea, etc as recommended.   Encourage group attendance/participation.  Discharge disposition plan is ongoing.  Armandina Stammer, NP, pmhnp, fnp-bc 09/18/2020, 2:13 PM

## 2020-09-18 NOTE — Progress Notes (Signed)
[facility] Group Notes:  (Nursing/MHT/Case Management/Adjunct)  Date:  09/18/2020  Time:  2000 Type of Therapy:   wrap up group  Participation Level:  Active  Participation Quality:  Appropriate, Attentive, Sharing, and Supportive  Affect:  Excited  Cognitive:  Alert  Insight:  Improving  Engagement in Group:  Engaged  Modes of Intervention:  Clarification, Education, and Support  Summary of Progress/Problems:Positive thinking and positive change were discussed.   Marcille Buffy 09/18/2020, 8:36 PM

## 2020-09-18 NOTE — Progress Notes (Signed)
D:  Pt presents with moderate anxiety on assessment.  Pt is seen interacting with peers and conversing.  Pt denies SI/HI, and verbally contracts for safety.  Pt admits to AVH ("friends she has seen and heard since she was a kid").  Pt reports, "voices only tell me bad stuff when I am in mania."  A:  Labs/Vitals monitored; Medication administration; Pt provided reassurance.  R:  Pt remains safe on unit with Q 15 minutes safety checks.     09/18/20 2133  Psych Admission Type (Psych Patients Only)  Admission Status Voluntary  Psychosocial Assessment  Patient Complaints Anxiety  Eye Contact Fair  Facial Expression Fixed smile  Affect Anxious  Speech Logical/coherent  Interaction Assertive  Motor Activity Fidgety;Other (Comment) (steady)  Appearance/Hygiene Unremarkable  Behavior Characteristics Cooperative;Anxious  Mood Pleasant;Anxious  Thought Process  Coherency WDL  Content WDL  Delusions None reported or observed  Perception Hallucinations  Hallucination Auditory;Visual  Judgment Poor  Confusion None  Danger to Self  Current suicidal ideation? Denies  Self-Injurious Behavior No self-injurious ideation or behavior indicators observed or expressed   Agreement Not to Harm Self Yes  Description of Agreement verbal contract for safety  Danger to Others  Danger to Others None reported or observed

## 2020-09-18 NOTE — Progress Notes (Signed)
Pt visible on the unit some this evening, pt continued to endorse anxiety . Writer educated pt on techniques to decrease anxiety    09/17/20 2300  Psych Admission Type (Psych Patients Only)  Admission Status Voluntary  Psychosocial Assessment  Patient Complaints Anxiety;Worrying  Eye Contact Fair  Facial Expression Sullen  Affect Sullen  Speech Logical/coherent  Interaction Assertive  Motor Activity Other (Comment);Fidgety (steady)  Appearance/Hygiene Unremarkable  Behavior Characteristics Cooperative;Anxious  Mood Depressed  Thought Process  Coherency WDL  Content WDL  Delusions None reported or observed  Perception WDL  Hallucination None reported or observed  Judgment Poor  Confusion None  Danger to Self  Current suicidal ideation? Denies  Danger to Others  Danger to Others None reported or observed

## 2020-09-18 NOTE — [facility] Group Notes (Signed)
Adult Psychoeducational Group Note  Date:  09/18/2020 Time:  9:23 AM  Group Topic/Focus:  Goals Group:   The focus of this group is to help patients establish daily goals to achieve during treatment and discuss how the patient can incorporate goal setting into their daily lives to aide in recovery.  Participation Level:  Active  Participation Quality:  Appropriate  Affect:  Anxious  Cognitive:  Appropriate  Insight: Appropriate  Engagement in Group:  Engaged  Modes of Intervention:  Orientation  Additional Comments:   [name] 09/18/2020, 9:23 AM

## 2020-09-19 DIAGNOSIS — R41840 Attention and concentration deficit: Secondary | ICD-10-CM | POA: Diagnosis not present

## 2020-09-19 DIAGNOSIS — F315 Bipolar disorder, current episode depressed, severe, with psychotic features: Secondary | ICD-10-CM | POA: Diagnosis not present

## 2020-09-19 DIAGNOSIS — F4310 Post-traumatic stress disorder, unspecified: Secondary | ICD-10-CM | POA: Diagnosis not present

## 2020-09-19 DIAGNOSIS — F5105 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 disorder: Secondary | ICD-10-CM | POA: Diagnosis not present

## 2020-09-19 MED ORDER — LAMOTRIGINE 25 MG PO TABS: 50.0000 mg | ORAL_TABLET | Freq: Every day | ORAL | 0 refills | Status: DC

## 2020-09-19 MED ORDER — NICOTINE 14 MG/24HR TD PT24: 14.0000 mg | MEDICATED_PATCH | Freq: Every day | TRANSDERMAL | 0 refills | Status: DC

## 2020-09-19 MED ORDER — TRAZODONE HCL 300 MG PO TABS: 300.0000 mg | ORAL_TABLET | Freq: Every day | ORAL | 0 refills | Status: DC

## 2020-09-19 MED ORDER — BUSPIRONE HCL 10 MG PO TABS: 10.0000 mg | ORAL_TABLET | Freq: Two times a day (BID) | ORAL | 0 refills | Status: DC

## 2020-09-19 MED ORDER — ESCITALOPRAM OXALATE 10 MG PO TABS: 10.0000 mg | ORAL_TABLET | Freq: Every day | ORAL | 0 refills | Status: DC

## 2020-09-19 MED ORDER — PRAZOSIN HCL 1 MG PO CAPS: 1.0000 mg | ORAL_CAPSULE | Freq: Every day | ORAL | 0 refills | Status: DC

## 2020-09-19 MED ORDER — ARIPIPRAZOLE 10 MG PO TABS: 10.0000 mg | ORAL_TABLET | Freq: Every day | ORAL | 0 refills | Status: DC
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
## 2020-09-19 NOTE — Progress Notes (Signed)
Recreation Therapy Notes  Date: 8.26.22 Time: 0930 Location: [address] Dayroom  Group Topic: Stress Management   Goal Area(s) Addresses:  Patient will actively participate in stress management techniques presented during session.  Patient will successfully identify benefit of practicing stress management post d/c.   Behavioral Response: Appropriate  Intervention: Relaxation exercise with ambient sound and script   Activity: Guided Imagery. LRT provided education, instruction, and demonstration on practice of visualization via guided imagery. Patients was asked to participate in the technique introduced during session. Patients were given suggestions of ways to access scripts post d/c and encouraged to explore Youtube and other apps available on smartphones, tablets, and computers.  Education:  Stress Management, Discharge Planning.   Education Outcome: Acknowledges education  Clinical Observations/Feedback: Patient actively engaged in technique introduced, expressed no concerns.    Caroll Rancher, LRT/CTRS         Caroll Rancher A 09/19/2020 10:41 AM
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
## 2020-09-19 NOTE — [facility] Group Notes (Signed)
[facility] Group Notes:  (Nursing/MHT/Case Management/Adjunct)  Date:  09/19/2020  Time:  9:43 AM  Type of Therapy:  Group Therapy  Participation Level:  Active  Participation Quality:  Appropriate  Affect:  Appropriate  Cognitive:  Appropriate  Insight:  Appropriate  Engagement in Group:  Engaged  Modes of Intervention:  Discussion  Summary of Progress/Problems: Pt was engaged in group session.  Jaquita Rector 09/19/2020, 9:43 AM

## 2020-09-19 NOTE — Progress Notes (Signed)
     09/19/20 0631  Vital Signs  Temp 97.8 F (36.6 C)  Temp Source Oral  Pulse Rate 82  BP 115/81  BP Location Left Arm  BP Method Automatic  Patient Position (if appropriate) Sitting      09/19/20 0633  Vital Signs  Pulse Rate (!) 104  BP (!) 144/129  BP Location Left Arm  BP Method Automatic  Patient Position (if appropriate) Standing   Per pt, "my blood pressure goes up like that when I get worked up."  Per pt, she "drank 2 bottles of water at breakfast."  Hydrated pt with an additional cup of water.  Advised pt of body positioning and to remain quiet and calm during the recheck of her vitals.  Reported the need to recheck to day shift RN in report.

## 2020-09-19 NOTE — Progress Notes (Addendum)
RN met with pt and reviewed pt's discharge instructions.  Pt verbalized understanding of discharge instructions and pt did not have any questions. RN reviewed and provided pt with a copy of SRA, AVS and Transition Record.  RN returned pt's belongings to pt.  Pt denied SI/HI/AVH and voiced no concerns.  Pt was appreciative of the care pt received at [facility].  Patient discharged to the lobby without incident. 

## 2020-09-19 NOTE — Progress Notes (Signed)
  [facility] Adult Case Management Discharge Plan :  Will you be returning to the same living situation after discharge:  Yes,  personal home At discharge, do you have transportation home?: No. Safe Transport Do you have the ability to pay for your medications: Yes,  Medicaid  Release of information consent forms completed and in the chart;  Patient's signature needed at discharge.  Patient to Follow up at:  Follow-up Information     [facility] Follow up on 10/06/2020.   Specialty: Behavioral Health Why: You have an appointment on 10/06/20 at 10:00 am for medication management services. Contact information: [address] [place] [address] [phone]        [facility] Go on 09/19/2020.   Why: You have a recurring appointment with your provider for therapy services on Fridays at 10:00 am.  These appointments are held in person Contact information: [address] [place], [place] [address]  P: [phone], or [phone]        [facility]. Go to.   Why: Please go to this provider for substance abuse intensive outpatient therapy services (SAIOP), during walk in hours:  Monday through Friday, from 6:00 am to 10:30 am. Contact information: [address] [place], [place] [address]  P: [phone] F: [phone]                Next level of care provider has access to [facility] Link:yes  Safety Planning and Suicide Prevention discussed: Yes,  wife     Has patient been referred to the Quitline?: Patient refused referral  Patient has been referred for addiction treatment: Yes  Felizardo Hoffmann, LCSWA 09/19/2020, 10:19 AM

## 2020-09-30 ENCOUNTER — Other Ambulatory Visit: Payer: Self-pay

## 2020-09-30 ENCOUNTER — Encounter (HOSPITAL_COMMUNITY): Payer: Self-pay

## 2020-09-30 ENCOUNTER — Emergency Department (HOSPITAL_COMMUNITY)
Admission: EM | Admit: 2020-09-30 | Discharge: 2020-09-30 | Disposition: A | Discharge status: Home / Self Care | Payer: Medicaid Other | Attending: Emergency Medicine | Admitting: Emergency Medicine

## 2020-09-30 DIAGNOSIS — R202 Paresthesia of skin: Secondary | ICD-10-CM | POA: Diagnosis not present

## 2020-09-30 DIAGNOSIS — R748 Abnormal levels of other serum enzymes: Secondary | ICD-10-CM | POA: Insufficient documentation

## 2020-09-30 DIAGNOSIS — J45909 Unspecified asthma, uncomplicated: Secondary | ICD-10-CM | POA: Insufficient documentation

## 2020-09-30 DIAGNOSIS — R200 Anesthesia of skin: Secondary | ICD-10-CM | POA: Diagnosis not present

## 2020-09-30 DIAGNOSIS — F17210 Nicotine dependence, cigarettes, uncomplicated: Secondary | ICD-10-CM | POA: Diagnosis not present

## 2020-09-30 LAB — URINALYSIS, ROUTINE W REFLEX MICROSCOPIC
Bilirubin Urine: NEGATIVE
Glucose, UA: NEGATIVE mg/dL
Hgb urine dipstick: NEGATIVE
Ketones, ur: NEGATIVE mg/dL
Leukocytes,Ua: NEGATIVE
Nitrite: NEGATIVE
Protein, ur: NEGATIVE mg/dL
Specific Gravity, Urine: 1.015 (ref 1.005–1.030)
pH: 7.5 (ref 5.0–8.0)

## 2020-09-30 LAB — CBC WITH DIFFERENTIAL/PLATELET
Abs Immature Granulocytes: 0.03 10*3/uL (ref 0.00–0.07)
Basophils Absolute: 0 10*3/uL (ref 0.0–0.1)
Basophils Relative: 0 %
Eosinophils Absolute: 0.1 10*3/uL (ref 0.0–0.5)
Eosinophils Relative: 1 %
HCT: 40.3 % (ref 36.0–46.0)
Hemoglobin: 13.1 g/dL (ref 12.0–15.0)
Immature Granulocytes: 0 %
Lymphocytes Relative: 23 %
Lymphs Abs: 1.6 10*3/uL (ref 0.7–4.0)
MCH: 31.1 pg (ref 26.0–34.0)
MCHC: 32.5 g/dL (ref 30.0–36.0)
MCV: 95.7 fL (ref 80.0–100.0)
Monocytes Absolute: 0.6 10*3/uL (ref 0.1–1.0)
Monocytes Relative: 8 %
Neutro Abs: 4.7 10*3/uL (ref 1.7–7.7)
Neutrophils Relative %: 68 %
Platelets: 203 10*3/uL (ref 150–400)
RBC: 4.21 MIL/uL (ref 3.87–5.11)
RDW: 13.4 % (ref 11.5–15.5)
WBC: 7 10*3/uL (ref 4.0–10.5)
nRBC: 0 % (ref 0.0–0.2)

## 2020-09-30 LAB — COMPREHENSIVE METABOLIC PANEL
ALT: 29 U/L (ref 0–44)
AST: 27 U/L (ref 15–41)
Albumin: 3.7 g/dL (ref 3.5–5.0)
Alkaline Phosphatase: 41 U/L (ref 38–126)
Anion gap: 6 (ref 5–15)
BUN: 12 mg/dL (ref 6–20)
CO2: 26 mmol/L (ref 22–32)
Calcium: 8.7 mg/dL — ABNORMAL LOW (ref 8.9–10.3)
Chloride: 107 mmol/L (ref 98–111)
Creatinine, Ser: 0.76 mg/dL (ref 0.44–1.00)
GFR, Estimated: >60 mL/min (ref >60)
Glucose, Bld: 96 mg/dL (ref 70–99)
Potassium: 3.9 mmol/L (ref 3.5–5.1)
Sodium: 139 mmol/L (ref 135–145)
Total Bilirubin: 0.5 mg/dL (ref 0.3–1.2)
Total Protein: 6.6 g/dL (ref 6.5–8.1)

## 2020-09-30 LAB — CK: Total CK: 451 U/L — ABNORMAL HIGH (ref 38–234)

## 2020-09-30 LAB — PREGNANCY, URINE: Preg Test, Ur: NEGATIVE

## 2020-09-30 LAB — MAGNESIUM: Magnesium: 2 mg/dL (ref 1.7–2.4)

## 2020-09-30 MED ORDER — LACTATED RINGERS IV BOLUS
1000.0000 mL | Freq: Once | INTRAVENOUS | Status: AC
  Administered 2020-09-30: 1000 mL via INTRAVENOUS

## 2020-09-30 MED ORDER — ACETAMINOPHEN 325 MG PO TABS
650.0000 mg | ORAL_TABLET | Freq: Once | ORAL | Status: AC
  Administered 2020-09-30: 650 mg via ORAL
  Filled 2020-09-30: qty 2

## 2020-09-30 NOTE — ED Notes (Signed)
Patient ambulated around room. No pain, but some tingling noted.

## 2020-09-30 NOTE — ED Notes (Signed)
Save tube in main lab.LAV,LT,BLUE

## 2020-09-30 NOTE — ED Notes (Signed)
Pain feels like stabbing, electrical shock sensations. Weakness due to pain.

## 2020-09-30 NOTE — ED Notes (Signed)
Discharge instructions reviewed with patient. Patient has no further questions at this time.

## 2020-09-30 NOTE — ED Provider Notes (Signed)
[facility]-EMERGENCY DEPT Provider Note   CSN: [id number] Arrival date & time: 09/30/20  0640     History Chief Complaint  Patient presents with   Numbness    [name] is a 20 y.o. female.  HPI Patient presents for BLE numbness and paresthesias.  Onset was this morning.  Patient reports that she did drink alcohol and smoke marijuana last night.  She denies sleeping in any odd positions overnight.  When she woke up this morning, she went to ambulate and felt the numbness and paresthesias in both of her legs from the knees down.  She has continued to have mild paresthesias.  She denies any history of the same.  She denies any recent traumas.  She denies any drug use other than marijuana.  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  She discontinued these donations 1 week ago.    Past Medical History:  Diagnosis Date   Anxiety    Asthma    Headache(784.0)    Hx of suicide attempt    Major depressive disorder    Morbid obesity (HCC) 03/25/2020   PTSD (post-traumatic stress disorder)     Patient Active Problem List   Diagnosis Date Noted   Bipolar affective disorder, depressed, severe, with psychotic behavior (HCC) 09/16/2020   Attention and concentration deficit 08/20/2020   Irritable bowel syndrome 03/25/2020   Morbid obesity (HCC) 03/25/2020   Substance induced mood disorder (HCC) 03/24/2020   Asthma due to seasonal allergies 03/13/2020   Seasonal allergies 03/13/2020   Tobacco abuse 03/13/2020   Homelessness 03/13/2020   Family history of breast cancer 03/13/2020   ASCUS with positive high risk HPV cervical 03/13/2020   Bipolar affective disorder, current episode manic (HCC) 01/24/2020   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 disorder 01/24/2020   Substance use disorder 06/08/2017   Elevated blood pressure reading 04/13/2017   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   Hx of suicide attempt 03/19/2016   PTSD (post-traumatic  stress disorder) 03/19/2016   Generalized anxiety disorder    Suicidal ideation     Past Surgical History:  Procedure Laterality Date   wisdom tooth extraction       OB History     Gravida  1   Para  1   Term  1   Preterm  0   AB  0   Living  1      SAB  0   IAB  0   Ectopic  0   Multiple  0   Live Births  1           Family History  Problem Relation Age of Onset   Diabetes Maternal Aunt    Diabetes Maternal Grandmother    Cancer Maternal Grandmother    Breast cancer Maternal Grandmother    Breast cancer Other     Social History   Tobacco Use   Smoking status: Every Day    Packs/day: 0.50    Years: 4.00    Pack years: 2.00    Types: Cigarettes, Cigars   Smokeless tobacco: Never   Tobacco comments:    black and mild with THC  Substance Use Topics   Alcohol use: Yes    Comment: been drinking heavily last 2 weeks; a 1/5 of liquor a day   Drug use: Yes    Types: Marijuana, MDMA (Ecstacy), Cocaine    Comment: Coc-last use 2/27; marijuana and MDMA last  use 2/28    Home Medications Prior to Admission medications   Medication Sig Start Date End Date Taking? Authorizing Provider  albuterol (VENTOLIN HFA) 108 (90 Base) MCG/ACT inhaler Inhale 1 puff into the lungs every 6 (six) hours as needed for wheezing or shortness of breath. 03/12/20   Mullis, Kiersten P, DO  ARIPiprazole (ABILIFY) 10 MG tablet Take 1 tablet (10 mg total) by mouth daily. For mood control 09/20/20   Armandina Stammer I, NP  busPIRone (BUSPAR) 10 MG tablet Take 1 tablet (10 mg total) by mouth 2 (two) times daily. For anxiety 09/19/20   Armandina Stammer I, NP  cetirizine (ZYRTEC) 10 MG tablet Take 1 tablet (10 mg total) by mouth daily. 03/12/20   Mullis, Kiersten P, DO  escitalopram (LEXAPRO) 10 MG tablet Take 1 tablet (10 mg total) by mouth daily. For depression 09/20/20   Armandina Stammer I, NP  lamoTRIgine (LAMICTAL) 25 MG tablet Take 2 tablets (50 mg total) by mouth daily. For mood stabilization  09/20/20   Nwoko, Nicole Kindred I, NP  nicotine (NICODERM CQ - DOSED IN MG/24 HOURS) 14 mg/24hr patch Place 1 patch (14 mg total) onto the skin daily. (May buy from over the counter): For smoking cessation 09/20/20   Armandina Stammer I, NP  prazosin (MINIPRESS) 1 MG capsule Take 1 capsule (1 mg total) by mouth at bedtime. For nightmares 09/19/20   Armandina Stammer I, NP  traZODone (DESYREL) 300 MG tablet Take 1 tablet (300 mg total) by mouth at bedtime. For sleep 09/19/20   Armandina Stammer I, NP    Allergies    Lactose intolerance (gi) and Tape  Review of Systems   Review of Systems  Constitutional:  Negative for activity change, appetite change, chills, fatigue and fever.  HENT:  Negative for ear pain and sore throat.   Eyes:  Negative for pain and visual disturbance.  Respiratory:  Negative for cough and shortness of breath.   Cardiovascular:  Negative for chest pain and palpitations.  Gastrointestinal:  Negative for abdominal pain, diarrhea, nausea and vomiting.  Genitourinary:  Negative for dysuria, flank pain, hematuria, pelvic pain and urgency.  Musculoskeletal:  Positive for myalgias. Negative for arthralgias, back pain, joint swelling and neck pain.  Skin:  Negative for color change and rash.  Neurological:  Positive for numbness. Negative for dizziness, seizures, syncope, speech difficulty, light-headedness and headaches.  All other systems reviewed and are negative.  Physical Exam Updated Vital Signs BP 101/85   Pulse 89   Temp 98.5 F (36.9 C)   Resp 18   Ht 5\' 9"  (1.753 m)   Wt 109.8 kg   SpO2 100%   BMI 35.74 kg/m   Physical Exam Vitals and nursing note reviewed.  Constitutional:      General: She is not in acute distress.    Appearance: Normal appearance. She is well-developed. She is obese. She is not ill-appearing, toxic-appearing or diaphoretic.  HENT:     Head: Normocephalic and atraumatic.     Right Ear: External ear normal.     Left Ear: External ear normal.     Nose: Nose  normal.     Mouth/Throat:     Mouth: Mucous membranes are moist.     Pharynx: Oropharynx is clear.  Eyes:     Conjunctiva/sclera: Conjunctivae normal.  Cardiovascular:     Rate and Rhythm: Normal rate and regular rhythm.     Heart sounds: No murmur heard. Pulmonary:     Effort: Pulmonary effort is  normal. No respiratory distress.     Breath sounds: Normal breath sounds.  Abdominal:     Palpations: Abdomen is soft.     Tenderness: There is no abdominal tenderness.  Musculoskeletal:        General: Tenderness (Bilateral legs, greater in distal extremities, equal on right and left.) present.     Cervical back: Normal range of motion and neck supple.  Skin:    General: Skin is warm and dry.     Capillary Refill: Capillary refill takes less than 2 seconds.     Coloration: Skin is not jaundiced or pale.  Neurological:     General: No focal deficit present.     Mental Status: She is alert and oriented to person, place, and time.     Cranial Nerves: No cranial nerve deficit.     Sensory: No sensory deficit.     Motor: No weakness.     Coordination: Coordination normal.  Psychiatric:        Mood and Affect: Mood normal.    ED Results / Procedures / Treatments   Labs (all labs ordered are listed, but only abnormal results are displayed) Labs Reviewed  COMPREHENSIVE METABOLIC PANEL - Abnormal; Notable for the following components:      Result Value   Calcium 8.7 (*)    All other components within normal limits  CK - Abnormal; Notable for the following components:   Total CK 451 (*)    All other components within normal limits  CBC WITH DIFFERENTIAL/PLATELET  MAGNESIUM  URINALYSIS, ROUTINE W REFLEX MICROSCOPIC  PREGNANCY, URINE    EKG None  Radiology No results found.  Procedures Procedures   Medications Ordered in ED Medications  lactated ringers bolus 1,000 mL (0 mLs Intravenous Stopped 09/30/20 1411)  lactated ringers bolus 1,000 mL (0 mLs Intravenous Stopped 09/30/20  1411)  acetaminophen (TYLENOL) tablet 650 mg (650 mg Oral Given 09/30/20 1044)    ED Course  I have reviewed the triage vital signs and the nursing notes.  Pertinent labs & imaging results that were available during my care of the patient were reviewed by me and considered in my medical decision making (see chart for details).    MDM Rules/Calculators/A&P                           Patient presents for bilateral lower extremity paresthesias starting this morning.  Vital signs are normal upon arrival.  On exam, patient does have diffuse tenderness throughout her lower extremities.  This is greatest in the distal aspects and not greater on one side more than the other.  Laboratory work-up was initiated.  Bolus of IVF was ordered.  Tylenol was given for analgesia.  Lab work was notable for elevation in CK.  Additional 1 liter bolus of IVF was given.  Creatinine was normal.  Urinalysis showed no evidence of myoglobinuria.  Per chart review, patient has had elevation in CK 6 years ago.  At that time, she had walked a prolonged distance.  Patient denies any recent physical exertion beyond what is normal for her.  Etiology of elevation in CK is unexplained at this time.  Patient was able to eat.  She was able to ambulate around the room without difficulty.  Given her other lab work which is reassuring, as well as her improved symptoms, patient is appropriate for discharge.  She was advised to drink plenty of fluids and to f return to the  ED if she experiences any new or worsening symptoms.  She was discharged in good condition.  Final Clinical Impression(s) / ED Diagnoses Final diagnoses:  Elevated CK    Rx / DC Orders ED Discharge Orders     None        Gloris Manchesterixon, Zephyr Ridley, MD 09/30/20 1759

## 2020-09-30 NOTE — ED Triage Notes (Signed)
Pt arrives EMS from home with c/o bilateral lower leg numbness that began this morning. Pt admits to heavy alcohol consumption last night. Went to bed, walked to the restroom this morning and her legs gave out. Describes a pins and needles sensation.

## 2020-10-06 ENCOUNTER — Encounter (HOSPITAL_COMMUNITY): Payer: Medicaid Other | Admitting: Physician Assistant

## 2020-10-30 ENCOUNTER — Encounter (HOSPITAL_COMMUNITY): Payer: Self-pay

## 2020-10-30 ENCOUNTER — Emergency Department (HOSPITAL_COMMUNITY)
Admission: EM | Admit: 2020-10-30 | Discharge: 2020-10-30 | Disposition: A | Discharge status: Home / Self Care | Payer: Medicaid Other | Attending: Emergency Medicine | Admitting: Emergency Medicine

## 2020-10-30 ENCOUNTER — Other Ambulatory Visit: Payer: Self-pay

## 2020-10-30 DIAGNOSIS — Z79899 Other long term (current) drug therapy: Secondary | ICD-10-CM | POA: Insufficient documentation

## 2020-10-30 DIAGNOSIS — J45909 Unspecified asthma, uncomplicated: Secondary | ICD-10-CM | POA: Diagnosis not present

## 2020-10-30 DIAGNOSIS — M79643 Pain in unspecified hand: Secondary | ICD-10-CM | POA: Diagnosis not present

## 2020-10-30 DIAGNOSIS — A599 Trichomoniasis, unspecified: Secondary | ICD-10-CM | POA: Diagnosis not present

## 2020-10-30 DIAGNOSIS — N730 Acute parametritis and pelvic cellulitis: Secondary | ICD-10-CM

## 2020-10-30 DIAGNOSIS — F32A Depression, unspecified: Secondary | ICD-10-CM | POA: Insufficient documentation

## 2020-10-30 DIAGNOSIS — N739 Female pelvic inflammatory disease, unspecified: Secondary | ICD-10-CM | POA: Insufficient documentation

## 2020-10-30 DIAGNOSIS — F17210 Nicotine dependence, cigarettes, uncomplicated: Secondary | ICD-10-CM | POA: Insufficient documentation

## 2020-10-30 DIAGNOSIS — N898 Other specified noninflammatory disorders of vagina: Secondary | ICD-10-CM

## 2020-10-30 LAB — URINALYSIS, ROUTINE W REFLEX MICROSCOPIC
Bilirubin Urine: NEGATIVE
Glucose, UA: NEGATIVE mg/dL
Ketones, ur: 20 mg/dL — AB
Nitrite: NEGATIVE
Protein, ur: 30 mg/dL — AB
Specific Gravity, Urine: 1.015 (ref 1.005–1.030)
WBC, UA: >50 WBC/hpf — ABNORMAL HIGH (ref 0–5)
pH: 6 (ref 5.0–8.0)

## 2020-10-30 LAB — WET PREP, GENITAL
Clue Cells Wet Prep HPF POC: NONE SEEN
Sperm: NONE SEEN
Yeast Wet Prep HPF POC: NONE SEEN

## 2020-10-30 LAB — CBC
HCT: 41.2 % (ref 36.0–46.0)
Hemoglobin: 13.7 g/dL (ref 12.0–15.0)
MCH: 31.5 pg (ref 26.0–34.0)
MCHC: 33.3 g/dL (ref 30.0–36.0)
MCV: 94.7 fL (ref 80.0–100.0)
Platelets: 172 10*3/uL (ref 150–400)
RBC: 4.35 MIL/uL (ref 3.87–5.11)
RDW: 12.3 % (ref 11.5–15.5)
WBC: 10.7 10*3/uL — ABNORMAL HIGH (ref 4.0–10.5)
nRBC: 0 % (ref 0.0–0.2)

## 2020-10-30 LAB — ETHANOL: Alcohol, Ethyl (B): <10 mg/dL (ref <10)

## 2020-10-30 LAB — COMPREHENSIVE METABOLIC PANEL
ALT: 23 U/L (ref 0–44)
AST: 28 U/L (ref 15–41)
Albumin: 3.7 g/dL (ref 3.5–5.0)
Alkaline Phosphatase: 56 U/L (ref 38–126)
Anion gap: 8 (ref 5–15)
BUN: 7 mg/dL (ref 6–20)
CO2: 24 mmol/L (ref 22–32)
Calcium: 8.7 mg/dL — ABNORMAL LOW (ref 8.9–10.3)
Chloride: 105 mmol/L (ref 98–111)
Creatinine, Ser: 0.89 mg/dL (ref 0.44–1.00)
GFR, Estimated: >60 mL/min (ref >60)
Glucose, Bld: 112 mg/dL — ABNORMAL HIGH (ref 70–99)
Potassium: 3.9 mmol/L (ref 3.5–5.1)
Sodium: 137 mmol/L (ref 135–145)
Total Bilirubin: 0.6 mg/dL (ref 0.3–1.2)
Total Protein: 7.1 g/dL (ref 6.5–8.1)

## 2020-10-30 LAB — RAPID URINE DRUG SCREEN, HOSP PERFORMED
Amphetamines: NOT DETECTED
Barbiturates: NOT DETECTED
Benzodiazepines: NOT DETECTED
Cocaine: NOT DETECTED
Opiates: NOT DETECTED
Tetrahydrocannabinol: POSITIVE — AB

## 2020-10-30 LAB — I-STAT BETA HCG BLOOD, ED (MC, WL, AP ONLY): I-stat hCG, quantitative: <5 m[IU]/mL (ref <5)

## 2020-10-30 LAB — SALICYLATE LEVEL: Salicylate Lvl: <7 mg/dL — ABNORMAL LOW (ref 7.0–30.0)

## 2020-10-30 LAB — ACETAMINOPHEN LEVEL: Acetaminophen (Tylenol), Serum: <10 ug/mL — ABNORMAL LOW (ref 10–30)

## 2020-10-30 MED ORDER — DOXYCYCLINE HYCLATE 100 MG PO CAPS: 100.0000 mg | ORAL_CAPSULE | Freq: Two times a day (BID) | ORAL | 0 refills | Status: AC

## 2020-10-30 MED ORDER — FOSFOMYCIN TROMETHAMINE 3 G PO PACK
3.0000 g | PACK | Freq: Once | ORAL | Status: AC
  Administered 2020-10-30: 3 g via ORAL
  Filled 2020-10-30 (×2): qty 3

## 2020-10-30 MED ORDER — CEFTRIAXONE SODIUM 500 MG IJ SOLR
500.0000 mg | Freq: Once | INTRAMUSCULAR | Status: AC
  Administered 2020-10-30: 500 mg via INTRAMUSCULAR
  Filled 2020-10-30: qty 500

## 2020-10-30 MED ORDER — LIDOCAINE HCL (PF) 1 % IJ SOLN
1.0000 mL | Freq: Once | INTRAMUSCULAR | Status: AC
  Administered 2020-10-30: 1 mL
  Filled 2020-10-30: qty 5

## 2020-10-30 MED ORDER — HYDROXYZINE HCL 25 MG PO TABS
25.0000 mg | ORAL_TABLET | Freq: Once | ORAL | Status: AC
  Administered 2020-10-30: 25 mg via ORAL
  Filled 2020-10-30: qty 1

## 2020-10-30 MED ORDER — DOXYCYCLINE HYCLATE 100 MG PO TABS: 100.0000 mg | ORAL_TABLET | Freq: Two times a day (BID) | ORAL | 0 refills | Status: DC

## 2020-10-30 MED ORDER — METRONIDAZOLE 500 MG PO TABS: 2000.0000 mg | ORAL_TABLET | Freq: Once | ORAL | Status: DC

## 2020-10-30 MED ORDER — METRONIDAZOLE 500 MG PO TABS: 500.0000 mg | ORAL_TABLET | Freq: Two times a day (BID) | ORAL | 0 refills | Status: AC

## 2020-10-30 NOTE — ED Provider Notes (Signed)
[facility] EMERGENCY DEPARTMENT Provider Note   CSN: [id number] Arrival date & time: 10/30/20  1027     History Chief Complaint  Patient presents with   Back Pain   Vaginal Itching   Suicidal    [name] is a 20 y.o. female.  20 y.o female with a PMH of Anxiety, Asthma, PTSD, Bipolar presents to the ED with chief complaint of back pain, hand pain, vaginal itching that has been ongoing for the last couple days.  She does report there is a foul odor to her vaginal discharge, the description also does not seem normal to her.  She is currently sexually active with a female partner.  Not tried any medication for improvement in symptoms.  She does not have any prior history of diabetes, although she was told "I was borderline but no one addressed it ".  While being triaged, patient voiced depressed mood to [facility] provider, statements such as "I am depressed, and I do not take my medication". I have "tried to kill myself in the last but not currently".  She was IVC by triage, denies to me suicidal ideations, homicidal ideations, visual or auditory hallucinations.  Obtain collateral info from wife.  The history is provided by the patient and medical records.  Back Pain Associated symptoms: no abdominal pain, no chest pain, no fever and no headaches   Vaginal Itching Pertinent negatives include no chest pain, no abdominal pain, no headaches and no shortness of breath.      Past Medical History:  Diagnosis Date   Anxiety    Asthma    Headache(784.0)    Hx of suicide attempt    Major depressive disorder    Morbid obesity (HCC) 03/25/2020   PTSD (post-traumatic stress disorder)     Patient Active Problem List   Diagnosis Date Noted   Bipolar affective disorder, depressed, severe, with psychotic behavior (HCC) 09/16/2020   Attention and concentration deficit 08/20/2020   Irritable bowel syndrome 03/25/2020   Morbid obesity (HCC) 03/25/2020   Substance induced mood  disorder (HCC) 03/24/2020   Asthma due to seasonal allergies 03/13/2020   Seasonal allergies 03/13/2020   Tobacco abuse 03/13/2020   Homelessness 03/13/2020   Family history of breast cancer 03/13/2020   ASCUS with positive high risk HPV cervical 03/13/2020   Bipolar affective disorder, current episode manic (HCC) 01/24/2020   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 disorder 01/24/2020   Substance use disorder 06/08/2017   Elevated blood pressure reading 04/13/2017   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   Hx of suicide attempt 03/19/2016   PTSD (post-traumatic stress disorder) 03/19/2016   Generalized anxiety disorder    Suicidal ideation     Past Surgical History:  Procedure Laterality Date   wisdom tooth extraction       OB History     Gravida  1   Para  1   Term  1   Preterm  0   AB  0   Living  1      SAB  0   IAB  0   Ectopic  0   Multiple  0   Live Births  1           Family History  Problem Relation Age of Onset   Diabetes Maternal Aunt    Diabetes Maternal Grandmother    Cancer Maternal Grandmother    Breast cancer Maternal Grandmother    Breast cancer Other     Social History   Tobacco  Use   Smoking status: Every Day    Packs/day: 0.50    Years: 4.00    Pack years: 2.00    Types: Cigarettes, Cigars   Smokeless tobacco: Never   Tobacco comments:    black and mild with THC  Substance Use Topics   Alcohol use: Yes    Comment: been drinking heavily last 2 weeks; a 1/5 of liquor a day   Drug use: Yes    Types: Marijuana, MDMA (Ecstacy), Cocaine    Comment: Coc-last use 2/27; marijuana and MDMA last use 2/28    Home Medications Prior to Admission medications   Medication Sig Start Date End Date Taking? Authorizing Provider  doxycycline (VIBRAMYCIN) 100 MG capsule Take 1 capsule (100 mg total) by mouth 2 (two) times daily for 7 days. 10/30/20 11/06/20 Yes Adalbert Alberto, PA-C  metroNIDAZOLE (FLAGYL) 500 MG tablet Take 1 tablet (500 mg total) by mouth 2  (two) times daily for 7 days. 10/30/20 11/06/20 Yes Chason Mciver, PA-C  albuterol (VENTOLIN HFA) 108 (90 Base) MCG/ACT inhaler Inhale 1 puff into the lungs every 6 (six) hours as needed for wheezing or shortness of breath. 03/12/20   Mullis, Kiersten P, DO  ARIPiprazole (ABILIFY) 10 MG tablet Take 1 tablet (10 mg total) by mouth daily. For mood control 09/20/20   Armandina Stammer I, NP  busPIRone (BUSPAR) 10 MG tablet Take 1 tablet (10 mg total) by mouth 2 (two) times daily. For anxiety 09/19/20   Armandina Stammer I, NP  cetirizine (ZYRTEC) 10 MG tablet Take 1 tablet (10 mg total) by mouth daily. 03/12/20   Mullis, Kiersten P, DO  escitalopram (LEXAPRO) 10 MG tablet Take 1 tablet (10 mg total) by mouth daily. For depression 09/20/20   Armandina Stammer I, NP  lamoTRIgine (LAMICTAL) 25 MG tablet Take 2 tablets (50 mg total) by mouth daily. For mood stabilization 09/20/20   Nwoko, Nicole Kindred I, NP  nicotine (NICODERM CQ - DOSED IN MG/24 HOURS) 14 mg/24hr patch Place 1 patch (14 mg total) onto the skin daily. (May buy from over the counter): For smoking cessation 09/20/20   Armandina Stammer I, NP  prazosin (MINIPRESS) 1 MG capsule Take 1 capsule (1 mg total) by mouth at bedtime. For nightmares 09/19/20   Armandina Stammer I, NP  traZODone (DESYREL) 300 MG tablet Take 1 tablet (300 mg total) by mouth at bedtime. For sleep 09/19/20   Armandina Stammer I, NP    Allergies    Lactose intolerance (gi) and Tape  Review of Systems   Review of Systems  Constitutional:  Negative for chills and fever.  HENT:  Negative for sore throat.   Respiratory:  Negative for shortness of breath.   Cardiovascular:  Negative for chest pain.  Gastrointestinal:  Negative for abdominal pain, nausea and vomiting.  Genitourinary:  Positive for vaginal discharge. Negative for flank pain.  Musculoskeletal:  Positive for back pain.  Neurological:  Negative for light-headedness and headaches.  All other systems reviewed and are negative.  Physical Exam Updated Vital  Signs BP 112/65 (BP Location: Right Arm)   Pulse 80   Temp 98 F (36.7 C) (Oral)   Resp 15   SpO2 100%   Physical Exam Vitals and nursing note reviewed. Exam conducted with a chaperone present.  Constitutional:      Appearance: Normal appearance.  HENT:     Head: Normocephalic and atraumatic.     Mouth/Throat:     Mouth: Mucous membranes are moist.  Eyes:  Pupils: Pupils are equal, round, and reactive to light.  Cardiovascular:     Rate and Rhythm: Normal rate.  Abdominal:     General: Abdomen is flat.     Palpations: Abdomen is soft.     Tenderness: There is no abdominal tenderness. There is right CVA tenderness.  Genitourinary:    Exam position: Supine.     Comments: [name] by [facility].  Copious amount of white thick discharge.  No foul odor noted.  No CMT, no adnexal tenderness. Musculoskeletal:     Cervical back: Normal range of motion and neck supple.  Skin:    General: Skin is warm and dry.  Neurological:     Mental Status: She is alert and oriented to person, place, and time.  Psychiatric:        Attention and Perception: Attention normal.        Mood and Affect: Mood is depressed.        Speech: Speech is delayed.        Behavior: Behavior is cooperative.    ED Results / Procedures / Treatments   Labs (all labs ordered are listed, but only abnormal results are displayed) Labs Reviewed  WET PREP, GENITAL - Abnormal; Notable for the following components:      Result Value   Trich, Wet Prep PRESENT (*)    WBC, Wet Prep HPF POC FEW (*)    All other components within normal limits  URINALYSIS, ROUTINE W REFLEX MICROSCOPIC - Abnormal; Notable for the following components:   APPearance CLOUDY (*)    Hgb urine dipstick MODERATE (*)    Ketones, ur 20 (*)    Protein, ur 30 (*)    Leukocytes,Ua LARGE (*)    WBC, UA >50 (*)    Bacteria, UA FEW (*)    All other components within normal limits  COMPREHENSIVE METABOLIC PANEL - Abnormal; Notable for the  following components:   Glucose, Bld 112 (*)    Calcium 8.7 (*)    All other components within normal limits  SALICYLATE LEVEL - Abnormal; Notable for the following components:   Salicylate Lvl <7.0 (*)    All other components within normal limits  ACETAMINOPHEN LEVEL - Abnormal; Notable for the following components:   Acetaminophen (Tylenol), Serum <10 (*)    All other components within normal limits  CBC - Abnormal; Notable for the following components:   WBC 10.7 (*)    All other components within normal limits  RAPID URINE DRUG SCREEN, HOSP PERFORMED - Abnormal; Notable for the following components:   Tetrahydrocannabinol POSITIVE (*)    All other components within normal limits  URINE CULTURE  ETHANOL  I-STAT BETA HCG BLOOD, ED ([facility], [facility], [facility] ONLY)  GC/CHLAMYDIA PROBE AMP ([place]) NOT AT [facility]    EKG None  Radiology No results found.  Procedures Procedures   Medications Ordered in ED Medications  cefTRIAXone (ROCEPHIN) injection 500 mg (has no administration in time range)  lidocaine (PF) (XYLOCAINE) 1 % injection 1 mL (has no administration in time range)  hydrOXYzine (ATARAX/VISTARIL) tablet 25 mg (25 mg Oral Given 10/30/20 1149)    ED Course  I have reviewed the triage vital signs and the nursing notes.  Pertinent labs & imaging results that were available during my care of the patient were reviewed by me and considered in my medical decision making (see chart for details).  Clinical Course as of 11/01/20 1014  Thu Oct 30, 2020  1336 Tetrahydrocannabinol(!): POSITIVE [JS]  1415 Leukocytes,Ua(!): LARGE [JS]  1415 WBC, UA(!): >50 [JS]  1415 Bacteria, UA(!): FEW [JS]  1437 WBC, Wet Prep HPF POC(!): FEW [JS]  1437 Trich, Wet Prep(!): PRESENT [JS]    Clinical Course User Index [JS] Claude Manges, PA-C   MDM Rules/Calculators/A&P   Patient with ongoing history of mental health disease presents to the ED with a chief complaint of vaginal discharge, back pain,  she is out of her normal.  No medication trial prior to arrival, she is currently sexually active with a female partner.  Last period approximately last month around this time.  Of note episode occurred in triage while patient was being medically screened by provider.  She reported "multiple prior suicide attempts, last 1 being a couple of days ago ".  According to patient these accusations, she has not been suicidal, she is depressed and does not take her medications.  She does report good follow-up with psychiatrist, support groups, theatric management, however dates that "does not help".  Labs on today's visit reveal a CMP with no electrolyte derangement.  Creatinine levels within normal limits.  On my exam there is pain along the right CVA, some suspicion for Pilo versus UTI.  CBC with a leukocytosis of 10, salicylate, acetaminophen level are within normal limits.  We did perform a urine test with patient was waiting in waiting room, large leukocytes, greater than 50 white blood cell count, few bacteria but also high squamous.  CG is negative on today's visit.  Rapid drug screen positive for THC.  C and chlamydia were also obtained during pelvic exam.  However results not returned.  Pelvic exam chaperoned by [name] NT, patient in supine position, no visible rashes, lesions noted on the outside aspect, internal exam remarkable for thick white discharge, cervix with some erythema noted.  No CMT tenderness, no adnexal tenderness.  Abdomen is soft nontender to palpation.  Wet prep remarkable for trichomonas, with few white blood cell count present.  2:41 PM multiple calls were placed to family members in order to obtain collateral information. To wife [name] at phone number #[phone] over phone number is off at this time.  2:58 PM suspicion for Pylo versus PID.  Will consult pharmacist in order to obtain best antibiotic regimen at this time.  Discussed antibiotic therapy with pharmacist can  see resident along with attending [name].  Recommended Flagyl, Doxy, Ceftin while in the ED.  Rocephin will help with coverage for a possibility of pyelonephritis at this time.  Of note, patient continues to deny any SI, HI, no plan at this time.  She would like to seek continued therapy with her outpatient resources.  I have discussed with patient that this is appropriate.  I also discussed patient with provider who IVC patient, patient will have IVC rescinded at this time.  She will receive treatment for her trichomonas and follow-up outpatient with resources.  After discussion patient continues to deny any SI, HI, no visual auditory hallucinations.  We did discuss with her at length along with my attending physician of returning if she experience any suicidal ideations.  Patient reports she has not had any attempts in several years.  She will be discharged from the ED after discontinuing her IVC.  Portions of this note were generated with Scientist, clinical (histocompatibility and immunogenetics). Dictation errors may occur despite best attempts at proofreading.  Final Clinical Impression(s) / ED Diagnoses Final diagnoses:  Vaginal discharge  Trichomonas infection  PID (acute pelvic inflammatory disease)    Rx /  DC Orders ED Discharge Orders          Ordered    doxycycline (VIBRA-TABS) 100 MG tablet  2 times daily,   Status:  Discontinued        10/30/20 1525    metroNIDAZOLE (FLAGYL) 500 MG tablet  2 times daily        10/30/20 1527    doxycycline (VIBRAMYCIN) 100 MG capsule  2 times daily        10/30/20 1527             Claude Manges, PA-C 11/01/20 1017    Alvira Monday, MD 11/02/20 2312

## 2020-10-30 NOTE — ED Notes (Signed)
During triage pt crying and reports SI and attempted SI a few days ago

## 2020-10-30 NOTE — Discharge Instructions (Addendum)
You were treated today for Pelvic inflamatory disease including trichomonas. You received medication while in the ED along with two different prescription of antibiotics.  You were given a prescription for Flagyl, please take 1 tablet twice a day for the next 7 days.  In addition, you were given a prescription for doxycycline, please take 1 tablet twice a day for the next 7 days.  If you experience fever, worsening symptoms you will need to return to the emergency department.  Please follow up with your therapist and psychiatry provider, and if you feel worse let someone know and return to the ED, [facility] or [facility] for care.

## 2020-10-30 NOTE — ED Notes (Signed)
PA advised not to change pt into scrubs att

## 2020-10-30 NOTE — ED Provider Notes (Signed)
I provided a substantive portion of the care of this patient.  I personally performed the entirety of the medical decision making for this encounter.  Discussed with patient. Denies SI at this time.  Reports she will present to [facility] or [facility] if she begins to feel more depressed or has worsening suicidal ideations. Acknowledges some thoughts of suicide and attempts in the remote past (age 20) but denies any continuing symptoms. Reports she will think of things that stop her from wanting to harm herself before she does and that she will seek treatment.  She contracts for safety.  She was agitated and upset prior to making statements in triage. I feel she is safe for discharge with strict return precautions and have rescinded her IVC.  Will treat for PID given vaginal discharge, given dose of fosfomycin for possible UTI, will otherwise send urine for culture as 3 antibiotic rx would be difficult to adhere to and it is unclear if symptoms/UA findings due to UTI vs trichomonas/PID. Patient discharged in stable condition with understanding of reasons to return.        Alvira Monday, MD 10/30/20 2153
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
## 2020-10-30 NOTE — ED Notes (Signed)
Pt refusing to change into purple scrubs. Pt states she is not a mental health patient, and until she can talk to a counselor, she will not change.

## 2020-10-31 LAB — GC/CHLAMYDIA PROBE AMP (~~LOC~~) NOT AT ARMC
Chlamydia: NEGATIVE
Comment: NEGATIVE
Comment: NORMAL
Neisseria Gonorrhea: NEGATIVE

## 2020-11-01 LAB — URINE CULTURE: Culture: >=100000 — AB

## 2020-11-02 ENCOUNTER — Telehealth: Payer: Self-pay | Admitting: Emergency Medicine

## 2020-11-02 NOTE — Telephone Encounter (Signed)
Post ED Visit - Positive Culture Follow-up  Culture report reviewed by antimicrobial stewardship pharmacist: [facility] []  , Pharm.D. []  Enzo Bi, Pharm.D., BCPS AQ-ID []  , Pharm.D., BCPS []  Celedonio Miyamoto, Pharm.D., BCPS []  [place], Garvin Fila.D., BCPS, AAHIVP []  , Pharm.D., BCPS, AAHIVP []  Georgina Pillion, PharmD, BCPS []  , PharmD, BCPS []  [place], PharmD, BCPS [x]  [address], PharmD []  , PharmD, BCPS []  Estella Husk, PharmD  Pharmacy Team []  Lysle Pearl, PharmD []  , PharmD []  Phillips Climes, PharmD []  , Rph []  Agapito Games) , PharmD []  Delmar Landau, PharmD []  , PharmD []  Mervyn Gay, PharmD []  , PharmD []  Vinnie Level, PharmD []  [facility], PharmD []  , PharmD []  Len Childs, PharmD   Positive urine culture Treated with Doxycycline, organism sensitive to the same and no further patient follow-up is required at this time.  Emalea Mix 11/02/2020, 1:59 PM

## 2020-12-15 ENCOUNTER — Ambulatory Visit
Admission: EM | Admit: 2020-12-15 | Discharge: 2020-12-15 | Disposition: A | Discharge status: Home / Self Care | Payer: Medicaid Other | Attending: Physician Assistant | Admitting: Physician Assistant

## 2020-12-15 ENCOUNTER — Other Ambulatory Visit: Payer: Self-pay

## 2020-12-15 DIAGNOSIS — M545 Low back pain, unspecified: Secondary | ICD-10-CM | POA: Diagnosis not present

## 2020-12-15 DIAGNOSIS — J111 Influenza due to unidentified influenza virus with other respiratory manifestations: Secondary | ICD-10-CM

## 2020-12-15 LAB — POCT URINALYSIS DIP (MANUAL ENTRY)
Bilirubin, UA: NEGATIVE
Blood, UA: NEGATIVE
Glucose, UA: NEGATIVE mg/dL
Leukocytes, UA: NEGATIVE
Nitrite, UA: NEGATIVE
Spec Grav, UA: 1.025 (ref 1.010–1.025)
Urobilinogen, UA: 0.2 E.U./dL
pH, UA: 6 (ref 5.0–8.0)

## 2020-12-15 LAB — POCT INFLUENZA A/B
Influenza A, POC: POSITIVE — AB
Influenza B, POC: NEGATIVE

## 2020-12-15 LAB — POCT URINE PREGNANCY: Preg Test, Ur: NEGATIVE

## 2020-12-15 MED ORDER — OSELTAMIVIR PHOSPHATE 75 MG PO CAPS: 75.0000 mg | ORAL_CAPSULE | Freq: Two times a day (BID) | ORAL | 0 refills | Status: DC

## 2020-12-15 MED ORDER — ACETAMINOPHEN 325 MG PO TABS
650.0000 mg | ORAL_TABLET | Freq: Once | ORAL | Status: AC
  Administered 2020-12-15: 650 mg via ORAL

## 2020-12-15 NOTE — ED Triage Notes (Signed)
Pt c/o cough, congestion, and fever x2 days. States has not taken any medications.  Pt c/o lower back pain with pain on urination x2 days.

## 2020-12-15 NOTE — Discharge Instructions (Signed)
Follow up if symptoms fail to improve or worsen. Recommend symptomatic treatment for symptoms, increased fluids and rest.

## 2020-12-15 NOTE — ED Provider Notes (Signed)
[facility]    CSN: [id number] Arrival date & time: 12/15/20  0947      History   Chief Complaint Chief Complaint  Patient presents with   Cough   Urinary Tract Infection    HPI [name] is a 20 y.o. female.   Patient here today for evaluation of cough, congestion and fever that started 2 days ago. She has also had lower back pain. She denies vomiting or diarrhea. She has not tried any medication for symptoms.   The history is provided by the patient.  Cough Associated symptoms: fever and sore throat   Associated symptoms: no ear pain, no eye discharge, no shortness of breath and no wheezing   Urinary Tract Infection Associated symptoms: fever   Associated symptoms: no abdominal pain, no nausea and no vomiting    Past Medical History:  Diagnosis Date   Anxiety    Asthma    Headache(784.0)    Hx of suicide attempt    Major depressive disorder    Morbid obesity (HCC) 03/25/2020   PTSD (post-traumatic stress disorder)     Patient Active Problem List   Diagnosis Date Noted   Bipolar affective disorder, depressed, severe, with psychotic behavior (HCC) 09/16/2020   Attention and concentration deficit 08/20/2020   Irritable bowel syndrome 03/25/2020   Morbid obesity (HCC) 03/25/2020   Substance induced mood disorder (HCC) 03/24/2020   Asthma due to seasonal allergies 03/13/2020   Seasonal allergies 03/13/2020   Tobacco abuse 03/13/2020   Homelessness 03/13/2020   Family history of breast cancer 03/13/2020   ASCUS with positive high risk HPV cervical 03/13/2020   Bipolar affective disorder, current episode manic (HCC) 01/24/2020   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 disorder 01/24/2020   Substance use disorder 06/08/2017   Elevated blood pressure reading 04/13/2017   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 Authorizing Provider  oseltamivir (TAMIFLU) 75 MG capsule Take 1 capsule (75 mg total) by mouth every 12 (twelve) hours. 12/15/20  Yes Tomi Bamberger, PA-C  albuterol (VENTOLIN HFA) 108 (90 Base) MCG/ACT inhaler Inhale 1 puff into the lungs every 6 (six) hours as needed for wheezing or shortness of breath. 03/12/20   Mullis, Kiersten P, DO  ARIPiprazole (ABILIFY) 10 MG tablet Take 1 tablet (10 mg total) by mouth daily. For mood control 09/20/20   Armandina Stammer I, NP  busPIRone (BUSPAR) 10 MG tablet Take 1 tablet (10 mg total) by mouth 2 (two) times daily. For anxiety 09/19/20   Armandina Stammer I, NP  cetirizine (ZYRTEC) 10 MG tablet Take 1 tablet (10 mg total) by mouth daily. 03/12/20   Mullis, Kiersten P, DO  escitalopram (LEXAPRO) 10 MG tablet Take 1 tablet (10 mg  total) by mouth daily. For depression 09/20/20   Lindell Spar I, NP  lamoTRIgine (LAMICTAL) 25 MG tablet Take 2 tablets (50 mg total) by mouth daily. For mood stabilization 09/20/20   Nwoko, Herbert Pun I, NP  nicotine (NICODERM CQ - DOSED IN MG/24 HOURS) 14 mg/24hr patch Place 1 patch (14 mg total) onto the skin daily. (May buy from over the counter): For smoking cessation 09/20/20   Lindell Spar I, NP  prazosin (MINIPRESS) 1 MG capsule Take 1 capsule (1 mg total) by mouth at bedtime. For nightmares 09/19/20   Lindell Spar I, NP  traZODone (DESYREL) 300 MG tablet Take 1 tablet (300 mg total) by mouth at bedtime. For sleep 09/19/20   Lindell Spar I, NP    Family History Family History  Problem Relation Age of Onset   Diabetes Maternal Aunt    Diabetes Maternal Grandmother    Cancer Maternal Grandmother     Breast cancer Maternal Grandmother    Breast cancer Other     Social History Social History   Tobacco Use   Smoking status: Every Day    Packs/day: 0.50    Years: 4.00    Pack years: 2.00    Types: Cigarettes, Cigars   Smokeless tobacco: Never   Tobacco comments:    black and mild with THC  Substance Use Topics   Alcohol use: Yes    Comment: been drinking heavily last 2 weeks; a 1/5 of liquor a day   Drug use: Not Currently    Types: Marijuana, MDMA (Ecstacy), Cocaine    Comment: Coc-last use 2/27; marijuana and MDMA last use 2/28     Allergies   Lactose intolerance (gi) and Tape   Review of Systems Review of Systems  Constitutional:  Positive for fever.  HENT:  Positive for congestion, sinus pressure and sore throat. Negative for ear pain.   Eyes:  Negative for discharge and redness.  Respiratory:  Positive for cough. Negative for shortness of breath and wheezing.   Gastrointestinal:  Negative for abdominal pain, diarrhea, nausea and vomiting.  Musculoskeletal:  Positive for back pain.    Physical Exam Triage Vital Signs ED Triage Vitals [12/15/20 1106]  Enc Vitals Group     BP 120/80     Pulse Rate (!) 110     Resp 18     Temp (!) 101.2 F (38.4 C)     Temp Source Oral     SpO2 96 %     Weight      Height      Head Circumference      Peak Flow      Pain Score 7     Pain Loc      Pain Edu?      Excl. in [place]?    No data found.  Updated Vital Signs BP 120/80 (BP Location: Left Arm)   Pulse (!) 110   Temp (!) 101.2 F (38.4 C) (Oral)   Resp 18   LMP 12/12/2020   SpO2 96%     Physical Exam Vitals and nursing note reviewed.  Constitutional:      General: She is not in acute distress.    Appearance: Normal appearance. She is not ill-appearing.  HENT:     Head: Normocephalic and atraumatic.     Right Ear: Tympanic membrane normal.     Left Ear: Tympanic membrane normal.     Nose: Congestion present.     Mouth/Throat:     Mouth: Mucous  membranes are moist.     Pharynx: No oropharyngeal exudate or posterior oropharyngeal erythema.  Eyes:     Conjunctiva/sclera: Conjunctivae normal.  Cardiovascular:     Rate and Rhythm: Normal rate and regular rhythm.     Heart sounds: Normal heart sounds. No murmur heard. Pulmonary:     Effort: Pulmonary effort is normal. No respiratory distress.     Breath sounds: Normal breath sounds. No wheezing, rhonchi or rales.  Skin:    General: Skin is warm and dry.  Neurological:     Mental Status: She is alert.  Psychiatric:        Mood and Affect: Mood normal.        Thought Content: Thought content normal.     UC Treatments / Results  Labs (all labs ordered are listed, but only abnormal results are displayed) Labs Reviewed  POCT INFLUENZA A/B - Abnormal; Notable for the following components:      Result Value   Influenza A, POC Positive (*)    All other components within normal limits  POCT URINALYSIS DIP (MANUAL ENTRY) - Abnormal; Notable for the following components:   Ketones, POC UA small (15) (*)    Protein Ur, POC trace (*)    All other components within normal limits  URINE CULTURE  POCT URINE PREGNANCY    EKG   Radiology No results found.  Procedures Procedures (including critical care time)  Medications Ordered in UC Medications  acetaminophen (TYLENOL) tablet 650 mg (650 mg Oral Given 12/15/20 1120)    Initial Impression / Assessment and Plan / UC Course  I have reviewed the triage vital signs and the nursing notes.  Pertinent labs & imaging results that were available during my care of the patient were reviewed by me and considered in my medical decision making (see chart for details).   Flu test positive in office. Tamiflu prescribed. Recommend symptomatic treatment otherwise and follow up if symptoms fail to improve or worsen.   Final Clinical Impressions(s) / UC Diagnoses   Final diagnoses:  Influenza  Acute low back pain without sciatica,  unspecified back pain laterality     Discharge Instructions      Follow up if symptoms fail to improve or worsen. Recommend symptomatic treatment for symptoms, increased fluids and rest.       ED Prescriptions     Medication Sig Dispense Auth. Provider   oseltamivir (TAMIFLU) 75 MG capsule Take 1 capsule (75 mg total) by mouth every 12 (twelve) hours. 10 capsule Francene Finders, PA-C      PDMP not reviewed this encounter.   Francene Finders, PA-C 12/15/20 1300

## 2021-04-10 ENCOUNTER — Ambulatory Visit (INDEPENDENT_AMBULATORY_CARE_PROVIDER_SITE_OTHER): Payer: Medicaid Other | Admitting: Physician Assistant

## 2021-04-10 ENCOUNTER — Other Ambulatory Visit (HOSPITAL_COMMUNITY): Payer: Self-pay | Admitting: Physician Assistant

## 2021-04-10 ENCOUNTER — Other Ambulatory Visit: Payer: Self-pay

## 2021-04-10 ENCOUNTER — Encounter (HOSPITAL_COMMUNITY): Payer: Self-pay | Admitting: Physician Assistant

## 2021-04-10 VITALS — BP 125/91 | HR 95 | Ht 69.0 in | Wt 247.0 lb

## 2021-04-10 DIAGNOSIS — F4310 Post-traumatic stress disorder, unspecified: Secondary | ICD-10-CM | POA: Diagnosis not present

## 2021-04-10 DIAGNOSIS — F5105 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 disorder: Secondary | ICD-10-CM

## 2021-04-10 DIAGNOSIS — R41840 Attention and concentration deficit: Secondary | ICD-10-CM

## 2021-04-10 DIAGNOSIS — F3110 Bipolar disorder, current episode manic without psychotic features, unspecified: Secondary | ICD-10-CM | POA: Diagnosis not present

## 2021-04-10 DIAGNOSIS — F411 Generalized anxiety disorder: Secondary | ICD-10-CM

## 2021-04-10 DIAGNOSIS — F99 Mental disorder, not otherwise specified: Secondary | ICD-10-CM

## 2021-04-10 DIAGNOSIS — F1210 Cannabis abuse, uncomplicated: Secondary | ICD-10-CM

## 2021-04-10 MED ORDER — LAMOTRIGINE 25 MG PO TABS: 50.0000 mg | ORAL_TABLET | Freq: Every day | ORAL | 1 refills | Status: DC

## 2021-04-10 MED ORDER — ATOMOXETINE HCL 40 MG PO CAPS: 40.0000 mg | ORAL_CAPSULE | Freq: Every day | ORAL | 1 refills | Status: DC

## 2021-04-10 MED ORDER — PRAZOSIN HCL 1 MG PO CAPS: 1.0000 mg | ORAL_CAPSULE | Freq: Every day | ORAL | 0 refills | Status: DC

## 2021-04-10 MED ORDER — ESCITALOPRAM OXALATE 10 MG PO TABS: 10.0000 mg | ORAL_TABLET | Freq: Every day | ORAL | 0 refills | Status: DC

## 2021-04-10 MED ORDER — LAMOTRIGINE 25 MG PO TABS: 25.0000 mg | ORAL_TABLET | Freq: Every day | ORAL | 0 refills | Status: DC

## 2021-04-10 MED ORDER — TRAZODONE HCL 300 MG PO TABS: 300.0000 mg | ORAL_TABLET | Freq: Every day | ORAL | 0 refills | Status: DC

## 2021-04-10 MED ORDER — BUSPIRONE HCL 10 MG PO TABS: 10.0000 mg | ORAL_TABLET | Freq: Two times a day (BID) | ORAL | 0 refills | Status: DC

## 2021-04-10 MED ORDER — ARIPIPRAZOLE 5 MG PO TABS: ORAL_TABLET | ORAL | 1 refills | Status: DC

## 2021-04-10 NOTE — Progress Notes (Addendum)
BH MD/PA/NP OP Progress Note ? ?04/10/2021 4:06 PM ?[name]  ?MRN:  [id number] ? ?Chief Complaint:  ?Chief Complaint  ?Patient presents with  ? WALK-IN  ?  WALK IN MM  ? ?HPI:  ? ?[name] is a 21 year old female with a past psychiatric history significant for PTSD, bipolar disorder, generalized anxiety disorder, insomnia, and cannabis use disorder who presents to [facility] behavioral health outpatient clinic for medication management.  Patient was last seen by this provider on 08/20/2020.  Patient was last prescribed on the following medications: ? ?Abilify 10 mg daily ?Buspirone 10 mg 2 times daily ?Escitalopram 10 mg daily ?Prazosin 1 mg at bedtime ?Lamotrigine 50 mg daily ?Trazodone 300 mg at bedtime ? ?Patient states that she is currently in a toxic relationship and continues to endorse the following stressors: housing, her grandmother passing away, and interpersonal conflicts within her toxic relationship.  Patient endorses experiencing crying spells as well as irritability.  She reports that her mood swings are bad and her anxiety continues to be elevated.  Patient endorses depression every single day and states that she currently feels manic at this time.  Patient endorses elevated heart rate, feeling wired, restless, and sleep disturbances.   ? ?Patient reports that she is trying to get her life in a better place.  Patient is currently working at [employer] and states that she is enrolling herself back into therapy.  Patient admits to relapsing from cocaine.  Patient plans on going back to NA meetings and setting herself up with peer support.  A PHQ-9 screen was performed with the patient scoring a 25.  A GAD-7 screen was also performed with the patient scoring a 21. ? ?Patient is alert and oriented x4, calm, cooperative, and fully engaged in conversation during the encounter.  Patient endorses pleasant mood.  Patient denies suicidal ideations.  She endorses passive homicidal ideations towards her  partner but denies plan or intent.  Patient denies visual hallucinations but states that she continues to experience spirits that are always roaming around her.  Patient endorses fair sleep and receives on average 4 to 6 hours of sleep each night.  Patient endorses fair appetite and eats on average 1-2 meals per day.  Patient endorses moderate alcohol consumption and drinks on average 3-4 times a week.  Patient endorses tobacco use and smokes on average three fourths to a pack per day.  Patient endorses illicit drug use in the form of marijuana and cocaine. ? ?Visit Diagnosis:  ?  ICD-10-CM   ?1. PTSD (post-traumatic stress disorder)  F43.10 escitalopram (LEXAPRO) 10 MG tablet  ?  ?2. Bipolar affective disorder, current episode manic, current episode severity unspecified (HCC)  F31.10 ARIPiprazole (ABILIFY) 5 MG tablet  ?  lamoTRIgine (LAMICTAL) 25 MG tablet  ?  lamoTRIgine (LAMICTAL) 25 MG tablet  ?  ?3. Generalized anxiety disorder  F41.1 escitalopram (LEXAPRO) 10 MG tablet  ?  busPIRone (BUSPAR) 10 MG tablet  ?  ?4. Attention and concentration deficit  R41.840 atomoxetine (STRATTERA) 40 MG capsule  ?  ?5. Cannabis use disorder, mild, abuse  F12.10   ?  ?6. 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 disorder  F51.05 trazodone (DESYREL) 300 MG tablet  ? F99 DISCONTINUED: prazosin (MINIPRESS) 1 MG capsule  ?  ? ? ?Past Psychiatric History:  ?Bipolar disorder ?Generalized anxiety disorder ?Insomnia ?PTSD ? ?Past Medical History:  ?Past Medical History:  ?Diagnosis Date  ? Anxiety   ? Asthma   ? Headache(784.0)   ? Hx  of suicide attempt   ? Major depressive disorder   ? Morbid obesity (HCC) 03/25/2020  ? PTSD (post-traumatic stress disorder)   ?  ?Past Surgical History:  ?Procedure Laterality Date  ? wisdom tooth extraction    ? ? ?Family Psychiatric History:  ?Mother - patient believes her mother is undiagnosed with bipolar disorder ?Grandmother (maternal) - Bipolar Disorder ? ?Family History:  ?Family History  ?Problem Relation Age  of Onset  ? Diabetes Maternal Aunt   ? Diabetes Maternal Grandmother   ? Cancer Maternal Grandmother   ? Breast cancer Maternal Grandmother   ? Breast cancer Other   ? ? ?Social History:  ?Social History  ? ?Socioeconomic History  ? Marital status: Single  ?  Spouse name: Not on file  ? Number of children: 2  ? Years of education: Not on file  ? Highest education level: Some college, no degree  ?Occupational History  ? Not on file  ?Tobacco Use  ? Smoking status: Every Day  ?  Packs/day: 0.50  ?  Years: 4.00  ?  Pack years: 2.00  ?  Types: Cigarettes, Cigars  ? Smokeless tobacco: Never  ? Tobacco comments:  ?  black and mild with THC  ?Substance and Sexual Activity  ? Alcohol use: Yes  ?  Comment: been drinking heavily last 2 weeks; a 1/5 of liquor a day  ? Drug use: Not Currently  ?  Types: Marijuana, MDMA (Ecstacy), Cocaine  ?  Comment: Coc-last use 2/27; marijuana and MDMA last use 2/28  ? Sexual activity: Yes  ?  Partners: Male  ?  Birth control/protection: None  ?Other Topics Concern  ? Not on file  ?Social History Narrative  ? Pt and family are homeless. She, her wife and two kids live in her cousin's house  ? ?Social Determinants of Health  ? ?Financial Resource Strain: Not on file  ?Food Insecurity: Not on file  ?Transportation Needs: Not on file  ?Physical Activity: Not on file  ?Stress: Not on file  ?Social Connections: Not on file  ? ? ?Allergies:  ?Allergies  ?Allergen Reactions  ? Lactose Intolerance (Gi) Nausea And Vomiting and Other (See Comments)  ? Tape Other (See Comments)  ?  Slight skin irritation  ? ? ?Metabolic Disorder Labs: ?Lab Results  ?Component Value Date  ? HGBA1C 5.4 09/16/2020  ? MPG 108.28 09/16/2020  ? MPG 108.28 03/25/2020  ? ?Lab Results  ?Component Value Date  ? PROLACTIN 6.6 12/28/2018  ? PROLACTIN 5.8 08/23/2017  ? ?Lab Results  ?Component Value Date  ? CHOL 175 09/17/2020  ? TRIG 42 09/17/2020  ? HDL 55 09/17/2020  ? CHOLHDL 3.2 09/17/2020  ? VLDL 8 09/17/2020  ? LDLCALC 112  (H) 09/17/2020  ? LDLCALC 125 (H) 09/16/2020  ? ?Lab Results  ?Component Value Date  ? TSH 0.477 09/16/2020  ? TSH 1.181 03/25/2020  ? ? ?Therapeutic Level Labs: ?No results found for: LITHIUM ?No results found for: VALPROATE ?No components found for:  CBMZ ? ?Current Medications: ?Current Outpatient Medications  ?Medication Sig Dispense Refill  ? atomoxetine (STRATTERA) 40 MG capsule Take 1 capsule (40 mg total) by mouth daily. 30 capsule 1  ? [START ON 04/24/2021] lamoTRIgine (LAMICTAL) 25 MG tablet Take 2 tablets (50 mg total) by mouth daily. 60 tablet 1  ? albuterol (VENTOLIN HFA) 108 (90 Base) MCG/ACT inhaler Inhale 1 puff into the lungs every 6 (six) hours as needed for wheezing or shortness of breath. 18 g  2  ? ARIPiprazole (ABILIFY) 5 MG tablet Take 1 tablet (5 mg total) by mouth daily for 6 days, THEN 2 tablets (10 mg total) daily. For mood control. 57 tablet 1  ? busPIRone (BUSPAR) 10 MG tablet Take 1 tablet (10 mg total) by mouth 2 (two) times daily. For anxiety 60 tablet 0  ? cetirizine (ZYRTEC) 10 MG tablet Take 1 tablet (10 mg total) by mouth daily. 30 tablet 11  ? escitalopram (LEXAPRO) 10 MG tablet Take 1 tablet (10 mg total) by mouth daily. For depression 30 tablet 0  ? lamoTRIgine (LAMICTAL) 25 MG tablet Take 1 tablet (25 mg total) by mouth daily. For mood stabilization 14 tablet 0  ? nicotine (NICODERM CQ - DOSED IN MG/24 HOURS) 14 mg/24hr patch Place 1 patch (14 mg total) onto the skin daily. (May buy from over the counter): For smoking cessation 28 patch 0  ? oseltamivir (TAMIFLU) 75 MG capsule Take 1 capsule (75 mg total) by mouth every 12 (twelve) hours. 10 capsule 0  ? prazosin (MINIPRESS) 1 MG capsule TAKE 1 CAPSULE(1 MG) BY MOUTH AT BEDTIME FOR NIGHTMARES 90 capsule 1  ? trazodone (DESYREL) 300 MG tablet Take 1 tablet (300 mg total) by mouth at bedtime. For sleep. Patient to take half-tablet as needed. 30 tablet 0  ? ?No current facility-administered medications for this visit.   ? ? ? ?Musculoskeletal: ?Strength & Muscle Tone: within normal limits ?Gait & Station: normal ?Patient leans: N/A ? ?Psychiatric Specialty Exam: ?Review of Systems  ?Psychiatric/Behavioral:  Positive for decreased

## 2021-04-11 ENCOUNTER — Inpatient Hospital Stay (HOSPITAL_COMMUNITY)
Admission: AD | Admit: 2021-04-11 | Discharge: 2021-04-11 | Disposition: A | Discharge status: Home / Self Care | Payer: Medicaid Other | Attending: Family Medicine | Admitting: Family Medicine

## 2021-04-11 ENCOUNTER — Other Ambulatory Visit: Payer: Self-pay

## 2021-04-11 ENCOUNTER — Encounter (HOSPITAL_COMMUNITY): Payer: Self-pay

## 2021-04-11 DIAGNOSIS — Z3202 Encounter for pregnancy test, result negative: Secondary | ICD-10-CM | POA: Insufficient documentation

## 2021-04-11 DIAGNOSIS — N926 Irregular menstruation, unspecified: Secondary | ICD-10-CM | POA: Diagnosis not present

## 2021-04-11 LAB — POCT PREGNANCY, URINE: Preg Test, Ur: NEGATIVE

## 2021-04-11 NOTE — MAU Provider Note (Signed)
[name] is a 21 y.o. G25P1001 non-pregnant female who presents to MAU today with complaint of negative pregnancy tests x3 but a late period, nausea, mood disturbance, breast tenderness and mild cramping. Wants a blood test to confirm she is not pregnant. ? ?Started on new pscyh meds yesterday for history of bipolar disorder (abilify/lamotrigine), which made her feel manic yesterday morning then very tired and sad overnight. Has trazadone but did not take last night because she'd had some alcohol and wasn't sure she should mix them, then did not sleep all night. Has a 23yo at home but he is staying her her parents. Endorses some passive SI, but no intent or plan. Mostly wants to feel better and does not want to be pregnant. No other physical complaints. ? ?Reviewed medical records. Was recently seen for mood disturbance, also has a history of requesting blood tests for proof of negative pregnancy. ? ?Pertinent items noted in HPI and remainder of comprehensive ROS otherwise negative.  ? ?O ?BP 130/83 (BP Location: Right Arm)   Pulse 78   Temp 97.8 ?F (36.6 ?C) (Oral)   Resp 16   Ht 5\' 9"  (1.753 m)   Wt 247 lb 4.8 oz (112.2 kg)   LMP 03/02/2021   SpO2 100% Comment: room air  BMI 36.52 kg/m?  ?Physical Exam ?Vitals and nursing note reviewed.  ?Constitutional:   ?   General: She is not in acute distress. ?   Appearance: Normal appearance. She is not ill-appearing.  ?HENT:  ?   Head: Normocephalic and atraumatic.  ?   Mouth/Throat:  ?   Mouth: Mucous membranes are moist.  ?Eyes:  ?   Pupils: Pupils are equal, round, and reactive to light.  ?Cardiovascular:  ?   Rate and Rhythm: Normal rate and regular rhythm.  ?   Pulses: Normal pulses.  ?Pulmonary:  ?   Effort: Pulmonary effort is normal.  ?Musculoskeletal:     ?   General: Normal range of motion.  ?Skin: ?   General: Skin is warm and dry.  ?   Capillary Refill: Capillary refill takes less than 2 seconds.  ?Neurological:  ?   Mental Status: She is alert  and oriented to person, place, and time.  ?Psychiatric:     ?   Behavior: Behavior normal.     ?   Thought Content: Thought content normal.     ?   Judgment: Judgment normal.  ?   Comments: Tearful at times, expresses anxiety and feeling overwhelmed but stable  ? ?Results for orders placed or performed during the hospital encounter of 04/11/21 (from the past 24 hour(s))  ?Pregnancy, urine POC     Status: None  ? Collection Time: 04/11/21 12:32 PM  ?Result Value Ref Range  ? Preg Test, Ur NEGATIVE NEGATIVE  ? ?MDM ?Informed pt of negative UPT and reassured her that multiple negative UPTs indicate absence of pregnancy, advised she can go to the ED for an iStat or for a pelvic pain workup but suggested she make an appt at an OB/GYN to explore why she is having irregular periods and such intense PMS/PMDD.  ? ?Explored whether she had active suicidal intent (no) and reminded her of the [facility] and encouraged her to present there if the SI got worse. Encouraged her to keep taking her new meds and helped her make a safety plan for the rest of her day. Reminded her that alcohol is a depressant and encouraged her to eat something,  take her trazadone and go to bed which will help settle her spiraling thoughts. By end of discussion, pt able to smile/laugh and expressed comfort with her safety plan. ? ?A ?Not pregnant ?Medical screening exam complete ? ?P ?Discharge from MAU in stable condition  ?Follow up with OB/GYN to discuss irregular periods. ?Warning signs for worsening condition that would warrant emergency follow-up discussed ?Patient may return to MAU as needed for emergent OB/GYN related complaints ? ?Edd Arbour R, CNM ?04/11/2021 1:06 PM  ? ?

## 2021-04-11 NOTE — MAU Note (Signed)
[name] is a 21 y.o. at Unknown here in MAU reporting: period is late and she has been nauseated and is feeling emotional. Would like a blood pregnancy test. Had negative home urine test. States she is having some back pain. No bleeding.  ? ?LMP: 03/02/21 ? ?Onset of complaint: ongoing ? ?Pain score: 4/10 ? ?Vitals:  ? 04/11/21 1221  ?BP: 130/83  ?Pulse: 78  ?Resp: 16  ?Temp: 97.8 ?F (36.6 ?C)  ?SpO2: 100%  ?   ?Lab orders placed from triage: upt ? ?

## 2021-05-08 ENCOUNTER — Emergency Department (HOSPITAL_COMMUNITY): Payer: Medicaid Other

## 2021-05-08 ENCOUNTER — Encounter (HOSPITAL_COMMUNITY): Payer: Self-pay

## 2021-05-08 ENCOUNTER — Emergency Department (HOSPITAL_COMMUNITY)
Admission: EM | Admit: 2021-05-08 | Discharge: 2021-05-08 | Disposition: A | Discharge status: Home / Self Care | Payer: Medicaid Other | Attending: Physician Assistant | Admitting: Physician Assistant

## 2021-05-08 DIAGNOSIS — R110 Nausea: Secondary | ICD-10-CM | POA: Diagnosis not present

## 2021-05-08 DIAGNOSIS — F17200 Nicotine dependence, unspecified, uncomplicated: Secondary | ICD-10-CM | POA: Diagnosis not present

## 2021-05-08 DIAGNOSIS — Z5321 Procedure and treatment not carried out due to patient leaving prior to being seen by health care provider: Secondary | ICD-10-CM | POA: Insufficient documentation

## 2021-05-08 DIAGNOSIS — G40909 Epilepsy, unspecified, not intractable, without status epilepticus: Secondary | ICD-10-CM | POA: Insufficient documentation

## 2021-05-08 LAB — CBC WITH DIFFERENTIAL/PLATELET
Abs Immature Granulocytes: 0.02 10*3/uL (ref 0.00–0.07)
Basophils Absolute: 0 10*3/uL (ref 0.0–0.1)
Basophils Relative: 0 %
Eosinophils Absolute: 0.1 10*3/uL (ref 0.0–0.5)
Eosinophils Relative: 1 %
HCT: 41.9 % (ref 36.0–46.0)
Hemoglobin: 14 g/dL (ref 12.0–15.0)
Immature Granulocytes: 0 %
Lymphocytes Relative: 20 %
Lymphs Abs: 1.6 10*3/uL (ref 0.7–4.0)
MCH: 32.2 pg (ref 26.0–34.0)
MCHC: 33.4 g/dL (ref 30.0–36.0)
MCV: 96.3 fL (ref 80.0–100.0)
Monocytes Absolute: 0.8 10*3/uL (ref 0.1–1.0)
Monocytes Relative: 10 %
Neutro Abs: 5.4 10*3/uL (ref 1.7–7.7)
Neutrophils Relative %: 69 %
Platelets: 204 10*3/uL (ref 150–400)
RBC: 4.35 MIL/uL (ref 3.87–5.11)
RDW: 13.4 % (ref 11.5–15.5)
WBC: 7.8 10*3/uL (ref 4.0–10.5)
nRBC: 0 % (ref 0.0–0.2)

## 2021-05-08 LAB — COMPREHENSIVE METABOLIC PANEL
ALT: 34 U/L (ref 0–44)
AST: 29 U/L (ref 15–41)
Albumin: 4 g/dL (ref 3.5–5.0)
Alkaline Phosphatase: 48 U/L (ref 38–126)
Anion gap: 4 — ABNORMAL LOW (ref 5–15)
BUN: 11 mg/dL (ref 6–20)
CO2: 26 mmol/L (ref 22–32)
Calcium: 8.6 mg/dL — ABNORMAL LOW (ref 8.9–10.3)
Chloride: 109 mmol/L (ref 98–111)
Creatinine, Ser: 0.77 mg/dL (ref 0.44–1.00)
GFR, Estimated: >60 mL/min (ref >60)
Glucose, Bld: 90 mg/dL (ref 70–99)
Potassium: 3.9 mmol/L (ref 3.5–5.1)
Sodium: 139 mmol/L (ref 135–145)
Total Bilirubin: 0.4 mg/dL (ref 0.3–1.2)
Total Protein: 6.9 g/dL (ref 6.5–8.1)

## 2021-05-08 LAB — I-STAT BETA HCG BLOOD, ED (MC, WL, AP ONLY): I-stat hCG, quantitative: <5 m[IU]/mL (ref <5)

## 2021-05-08 NOTE — ED Notes (Signed)
Pt IV removed per pts request. Pt sts she is going home ?

## 2021-05-08 NOTE — ED Provider Triage Note (Signed)
Emergency Medicine Provider Triage Evaluation Note ? ?[name] , a 21 y.o. female  was evaluated in triage.  Pt complains of concern for seizure.  She used cocaine early this morning and ecstasy and alcohol. She had a prior seizure when she was 17.  ? ?She reports that she smokes weed every day and smokes tobacco.  ? ?Her syncope was witnessed today.  She reports that she was talking, then had shaking and fell to the floor.  ? ?She thinks she bit her tongue and may have hit her head.  ? ?Patient adds that she thinks she may be pregnant as she had unprotected sex. ? ?Physical Exam  ?BP (!) 143/91 (BP Location: Left Arm)   Pulse 78   Temp 98.7 ?F (37.1 ?C) (Oral)   Resp 18   SpO2 99%  ?Gen:   Awake, no distress   ?Resp:  Normal effort  ?MSK:   Moves extremities without difficulty  ?Other:  Normal speech ? ?Medical Decision Making  ?Medically screening exam initiated at 1:27 PM.  Appropriate orders placed.  [name] was informed that the remainder of the evaluation will be completed by another provider, this initial triage assessment does not replace that evaluation, and the importance of remaining in the ED until their evaluation is complete. ? ? ?  ?Lorin Glass, [place] ?05/08/21 1334 ? ?

## 2021-05-08 NOTE — ED Triage Notes (Signed)
Pt states she believes she had seizure today from using too much cocaine and ecstasy. States that has happened in the past as well. C/o pain throughout entire body.  ?

## 2021-05-08 NOTE — ED Triage Notes (Addendum)
Per EMS, patient from home, witnessed syncopal episode today. Reports using cocaine and ecstasy last night. C/o chronic back pain, headache, and nausea.  ? ?18g R AC ?4mg  Zofran with EMS ?

## 2021-05-20 ENCOUNTER — Encounter (HOSPITAL_COMMUNITY): Payer: Medicaid Other | Admitting: Physician Assistant

## 2021-05-21 ENCOUNTER — Encounter (HOSPITAL_COMMUNITY): Payer: Self-pay

## 2021-05-21 ENCOUNTER — Inpatient Hospital Stay (HOSPITAL_COMMUNITY)
Admission: AD | Admit: 2021-05-21 | Discharge: 2021-05-21 | Disposition: A | Discharge status: Home / Self Care | Payer: Medicaid Other | Attending: Obstetrics and Gynecology | Admitting: Obstetrics and Gynecology

## 2021-05-21 DIAGNOSIS — O4690 Antepartum hemorrhage, unspecified, unspecified trimester: Secondary | ICD-10-CM | POA: Diagnosis present

## 2021-05-21 DIAGNOSIS — Z3202 Encounter for pregnancy test, result negative: Secondary | ICD-10-CM | POA: Diagnosis not present

## 2021-05-21 DIAGNOSIS — F315 Bipolar disorder, current episode depressed, severe, with psychotic features: Secondary | ICD-10-CM | POA: Diagnosis not present

## 2021-05-21 DIAGNOSIS — F319 Bipolar disorder, unspecified: Secondary | ICD-10-CM | POA: Diagnosis not present

## 2021-05-21 LAB — WET PREP, GENITAL
Clue Cells Wet Prep HPF POC: NONE SEEN
Sperm: NONE SEEN
Trich, Wet Prep: NONE SEEN
WBC, Wet Prep HPF POC: >=10 — AB (ref <10)
Yeast Wet Prep HPF POC: NONE SEEN

## 2021-05-21 LAB — HEPATITIS PANEL, ACUTE
HCV Ab: NONREACTIVE
Hep A IgM: NONREACTIVE
Hep B C IgM: NONREACTIVE
Hepatitis B Surface Ag: NONREACTIVE

## 2021-05-21 LAB — HIV ANTIBODY (ROUTINE TESTING W REFLEX): HIV Screen 4th Generation wRfx: NONREACTIVE

## 2021-05-21 LAB — POCT PREGNANCY, URINE: Preg Test, Ur: NEGATIVE

## 2021-05-21 NOTE — MAU Provider Note (Signed)
?History  ?  ? ?[id number] ? ?Arrival date and time: 05/21/21 1106 ?  ? ?Chief Complaint  ?Patient presents with  ? Abdominal Pain  ? Vaginal Bleeding  ? ? ? ?HPI ?[name] is a 21 y.o. at Unknown by LMP with PMHx notable for bipolar disorder, polysubstance use disorder, who presents for concern for miscarriage.  ? ?Patient last seen in MAU on 04/11/2021, not pregnant at that time but reporting multiple psychiatric issues, discharged home ? 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 ?Also concerned about possible STI exposure and requesting STI testing ? ?Reports that she has passive SI ?Reports she has had a plan in the past but none currently and does not want to die ?She reports contracting for safety with her psych team as well as agreeing to do so with [place] ?She missed a medication management appt at [facility] services yesterday and refills were not sent ?She decided to go to urgent care today to get it worked out ?She kept her grandfather waiting a few minutes to change her pants because she had some vaginal bleeding, and then her grandfather was yelling at her  ?She demanded to be let out and stayed on the side of the road until she called EMS to bring her here ? ?  ? ?OB History   ? ? Gravida  ?1  ? Para  ?1  ? Term  ?1  ? Preterm  ?0  ? AB  ?0  ? Living  ?1  ?  ? ? SAB  ?0  ? IAB  ?0  ? Ectopic  ?0  ? Multiple  ?0  ? Live Births  ?1  ?   ?  ?  ? ? ?Past Medical History:  ?Diagnosis Date  ? Anxiety   ? Asthma   ? Headache(784.0)   ? Hx of suicide attempt   ? Major depressive disorder   ? Morbid obesity (HCC) 03/25/2020  ? PTSD (post-traumatic stress disorder)   ? ? ?Past Surgical History:  ?Procedure Laterality Date  ? wisdom tooth  extraction    ? ? ?Family History  ?Problem Relation Age of Onset  ? Diabetes Maternal Aunt   ? Diabetes Maternal Grandmother   ? Cancer Maternal Grandmother   ? Breast cancer Maternal Grandmother   ? Breast cancer Other   ? ? ?Social History  ? ?Socioeconomic History  ? Marital status: Single  ?  Spouse name: Not on file  ? Number of children: 2  ? Years of education: Not on file  ? Highest education level: Some college, no degree  ?Occupational History  ? Not on file  ?Tobacco Use  ? Smoking status: Every Day  ?  Packs/day: 0.50  ?  Years: 4.00  ?  Pack years: 2.00  ?  Types: Cigarettes, Cigars  ? Smokeless tobacco: Never  ? Tobacco comments:  ?  black and mild with THC  ?Substance and Sexual Activity  ? Alcohol use: Yes  ?  Comment: been drinking heavily last 2 weeks; a 1/5 of liquor a day  ? Drug use: Not Currently  ?  Types: Marijuana, MDMA (Ecstacy), Cocaine  ?  Comment: Coc-last use  2/27; marijuana and MDMA last use 2/28  ? Sexual activity: Yes  ?  Partners: Male  ?  Birth control/protection: None  ?Other Topics Concern  ? Not on file  ?Social History Narrative  ? Pt and family are homeless. She, her wife and two kids live in her cousin's house  ? ?Social Determinants of Health  ? ?Financial Resource Strain: Not on file  ?Food Insecurity: Not on file  ?Transportation Needs: Not on file  ?Physical Activity: Not on file  ?Stress: Not on file  ?Social Connections: Not on file  ?Intimate Partner Violence: Not on file  ? ? ?Allergies  ?Allergen Reactions  ? Lactose Intolerance (Gi) Nausea And Vomiting and Other (See Comments)  ? Tape Other (See Comments)  ?  Slight skin irritation  ? ? ?No current facility-administered medications on file prior to encounter.  ? ?Current Outpatient Medications on File Prior to Encounter  ?Medication Sig Dispense Refill  ? albuterol (VENTOLIN HFA) 108 (90 Base) MCG/ACT inhaler Inhale 1 puff into the lungs every 6 (six) hours as needed for wheezing or shortness of breath. 18 g 2  ?  ARIPiprazole (ABILIFY) 5 MG tablet Take 1 tablet (5 mg total) by mouth daily for 6 days, THEN 2 tablets (10 mg total) daily. For mood control. 57 tablet 1  ? atomoxetine (STRATTERA) 40 MG capsule Take 1 capsule (40 mg total) by mouth daily. 30 capsule 1  ? busPIRone (BUSPAR) 10 MG tablet Take 1 tablet (10 mg total) by mouth 2 (two) times daily. For anxiety 60 tablet 0  ? cetirizine (ZYRTEC) 10 MG tablet Take 1 tablet (10 mg total) by mouth daily. 30 tablet 11  ? escitalopram (LEXAPRO) 10 MG tablet Take 1 tablet (10 mg total) by mouth daily. For depression 30 tablet 0  ? lamoTRIgine (LAMICTAL) 25 MG tablet Take 1 tablet (25 mg total) by mouth daily. For mood stabilization 14 tablet 0  ? lamoTRIgine (LAMICTAL) 25 MG tablet Take 2 tablets (50 mg total) by mouth daily. 60 tablet 1  ? nicotine (NICODERM CQ - DOSED IN MG/24 HOURS) 14 mg/24hr patch Place 1 patch (14 mg total) onto the skin daily. (May buy from over the counter): For smoking cessation 28 patch 0  ? oseltamivir (TAMIFLU) 75 MG capsule Take 1 capsule (75 mg total) by mouth every 12 (twelve) hours. 10 capsule 0  ? prazosin (MINIPRESS) 1 MG capsule TAKE 1 CAPSULE(1 MG) BY MOUTH AT BEDTIME FOR NIGHTMARES 90 capsule 1  ? trazodone (DESYREL) 300 MG tablet Take 1 tablet (300 mg total) by mouth at bedtime. For sleep. Patient to take half-tablet as needed. 30 tablet 0  ? ? ? ?ROS ?Pertinent positives and negative per HPI, all others reviewed and negative ? ?Physical Exam  ? ?BP (!) 143/82   Pulse 89   Resp 17   LMP 05/02/2021   SpO2 100%  ? ?Patient Vitals for the past 24 hrs: ? BP Temp src Pulse Resp SpO2  ?05/21/21 1245 (!) 143/82 -- 89 -- 100 %  ?05/21/21 1117 136/73 Oral 80 17 99 %  ? ? ?Physical Exam ?Vitals reviewed.  ?Constitutional:   ?   General: She is not in acute distress. ?   Appearance: She is well-developed. She is not diaphoretic.  ?Eyes:  ?   General: No scleral icterus. ?Pulmonary:  ?   Effort: Pulmonary effort is normal. No respiratory  distress.  ?Abdominal:  ?   General: There is no distension.  ?  Palpations: Abdomen is soft.  ?   Tenderness: There is no abdominal tenderness. There is no guarding or rebound.  ?Skin: ?   General: Skin is warm and dry.  ?Neurological:  ?   Mental Status: She is alert.  ?   Coordination: Coordination normal.  ?  ? ?Cervical Exam ?  ? ?Bedside Ultrasound ?Not done ? ?My interpretation: n/a ? ? ?Labs ?Results for orders placed or performed during the hospital encounter of 05/21/21 (from the past 24 hour(s))  ?Pregnancy, urine POC     Status: None  ? Collection Time: 05/21/21 11:15 AM  ?Result Value Ref Range  ? Preg Test, Ur NEGATIVE NEGATIVE  ?Wet prep, genital     Status: Abnormal  ? Collection Time: 05/21/21 12:42 PM  ? Specimen: Vaginal  ?Result Value Ref Range  ? Yeast Wet Prep HPF POC NONE SEEN NONE SEEN  ? Trich, Wet Prep NONE SEEN NONE SEEN  ? Clue Cells Wet Prep HPF POC NONE SEEN NONE SEEN  ? WBC, Wet Prep HPF POC >=10 (A) <10  ? Sperm NONE SEEN   ? ? ?Imaging ?No results found. ? ?MAU Course  ?Procedures ?Lab Orders    ?     Wet prep, genital    ?     HIV Antibody (routine testing w rflx)    ?     RPR    ?     Hepatitis panel, acute    ?     Pregnancy, urine POC    ?No orders of the defined types were placed in this encounter. ? ?Imaging Orders  ?No imaging studies ordered today  ? ? ?MDM ?moderate ? ?Assessment and Plan  ?#Vaginal bleeding, nonpregnant ?UPT is negative here, and based on timing and prior tests its unlikely she was pregnant. Bleeding more consistent with menses. ? ?#Bipolar disorder ?#Suicidal ideation ?Primary issues during visit were psychiatric. Endorsed passive SI but no plan currently and contracted for safety. [facility] but ultimately ended up in MAU. Discussed that given her lack of obstetrical complaints, best option would be for us to facilitate her [facility]. Initially we were coordinating  transportation, but ultimately she was able to have a family member pick her up and take her over.  ? ?#Possible exposure to STI ?Concerned for STI exposure after sex work, vaginal swab and serologies collected, will notify of re

## 2021-05-21 NOTE — Discharge Instructions (Signed)
You were evaluated in the MAU (Maternity Assessment Unit).  ? ?We determined that you are not pregnant, and that your vaginal bleeding is likely related to your menstrual cycle. ? ?After talking, we determined that your needs would be best served by going to the [facility] urgent care. Please go directly there once leaving MAU.  ?
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
## 2021-05-22 LAB — GC/CHLAMYDIA PROBE AMP (~~LOC~~) NOT AT ARMC
Chlamydia: NEGATIVE
Comment: NEGATIVE
Comment: NORMAL
Neisseria Gonorrhea: NEGATIVE

## 2021-05-22 LAB — RPR: RPR Ser Ql: NONREACTIVE

## 2021-05-25 ENCOUNTER — Ambulatory Visit (HOSPITAL_COMMUNITY)
Admission: RE | Admit: 2021-05-25 | Discharge: 2021-05-25 | Disposition: A | Discharge status: Home / Self Care | Payer: Medicaid Other | Attending: Psychiatry | Admitting: Psychiatry
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
## 2021-05-27 ENCOUNTER — Encounter (HOSPITAL_COMMUNITY): Payer: Self-pay | Admitting: Physician Assistant

## 2021-05-27 ENCOUNTER — Ambulatory Visit (INDEPENDENT_AMBULATORY_CARE_PROVIDER_SITE_OTHER): Payer: Medicaid Other | Admitting: Physician Assistant

## 2021-05-27 VITALS — BP 130/83 | HR 69 | Ht 69.0 in | Wt 238.0 lb

## 2021-05-27 DIAGNOSIS — R41840 Attention and concentration deficit: Secondary | ICD-10-CM | POA: Diagnosis not present

## 2021-05-27 DIAGNOSIS — F17200 Nicotine dependence, unspecified, uncomplicated: Secondary | ICD-10-CM | POA: Diagnosis not present

## 2021-05-27 DIAGNOSIS — F99 Mental disorder, not otherwise specified: Secondary | ICD-10-CM

## 2021-05-27 DIAGNOSIS — F4310 Post-traumatic stress disorder, unspecified: Secondary | ICD-10-CM

## 2021-05-27 DIAGNOSIS — F411 Generalized anxiety disorder: Secondary | ICD-10-CM

## 2021-05-27 DIAGNOSIS — F5105 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 disorder: Secondary | ICD-10-CM | POA: Diagnosis not present

## 2021-05-27 DIAGNOSIS — F3110 Bipolar disorder, current episode manic without psychotic features, unspecified: Secondary | ICD-10-CM

## 2021-05-27 MED ORDER — LAMOTRIGINE 25 MG PO TABS: 25.0000 mg | ORAL_TABLET | Freq: Every day | ORAL | 0 refills | Status: DC

## 2021-05-27 MED ORDER — LAMOTRIGINE 25 MG PO TABS: 50.0000 mg | ORAL_TABLET | Freq: Every day | ORAL | 1 refills | Status: DC

## 2021-05-27 MED ORDER — ARIPIPRAZOLE 5 MG PO TABS: ORAL_TABLET | ORAL | 1 refills | Status: DC

## 2021-05-27 MED ORDER — NICOTINE 21 MG/24HR TD PT24: 21.0000 mg | MEDICATED_PATCH | Freq: Every day | TRANSDERMAL | 0 refills | Status: DC

## 2021-05-27 MED ORDER — TRAZODONE HCL 300 MG PO TABS: 300.0000 mg | ORAL_TABLET | Freq: Every day | ORAL | 1 refills | Status: DC

## 2021-05-27 MED ORDER — ATOMOXETINE HCL 40 MG PO CAPS: 40.0000 mg | ORAL_CAPSULE | Freq: Every day | ORAL | 1 refills | Status: DC

## 2021-05-27 MED ORDER — BUSPIRONE HCL 10 MG PO TABS: 10.0000 mg | ORAL_TABLET | Freq: Two times a day (BID) | ORAL | 1 refills | Status: DC

## 2021-05-27 MED ORDER — ESCITALOPRAM OXALATE 10 MG PO TABS: 10.0000 mg | ORAL_TABLET | Freq: Every day | ORAL | 1 refills | Status: DC
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
## 2021-05-30 MED ORDER — PRAZOSIN HCL 1 MG PO CAPS: ORAL_CAPSULE | ORAL | 1 refills | Status: DC

## 2021-06-03 ENCOUNTER — Emergency Department (HOSPITAL_COMMUNITY): Payer: Medicaid Other

## 2021-06-03 ENCOUNTER — Other Ambulatory Visit: Payer: Self-pay

## 2021-06-03 ENCOUNTER — Emergency Department (HOSPITAL_COMMUNITY)
Admission: EM | Admit: 2021-06-03 | Discharge: 2021-06-03 | Disposition: A | Discharge status: Home / Self Care | Payer: Medicaid Other | Attending: Emergency Medicine | Admitting: Emergency Medicine

## 2021-06-03 ENCOUNTER — Encounter (HOSPITAL_COMMUNITY): Payer: Self-pay | Admitting: Emergency Medicine

## 2021-06-03 DIAGNOSIS — F17210 Nicotine dependence, cigarettes, uncomplicated: Secondary | ICD-10-CM | POA: Diagnosis not present

## 2021-06-03 DIAGNOSIS — T50901A Poisoning by unspecified drugs, medicaments and biological substances, accidental (unintentional), initial encounter: Secondary | ICD-10-CM

## 2021-06-03 DIAGNOSIS — T507X1A Poisoning by analeptics and opioid receptor antagonists, accidental (unintentional), initial encounter: Secondary | ICD-10-CM | POA: Diagnosis present

## 2021-06-03 DIAGNOSIS — T887XXA Unspecified adverse effect of drug or medicament, initial encounter: Secondary | ICD-10-CM | POA: Diagnosis not present

## 2021-06-03 DIAGNOSIS — J45909 Unspecified asthma, uncomplicated: Secondary | ICD-10-CM | POA: Diagnosis not present

## 2021-06-03 LAB — CBC WITH DIFFERENTIAL/PLATELET
Abs Immature Granulocytes: 0.14 10*3/uL — ABNORMAL HIGH (ref 0.00–0.07)
Basophils Absolute: 0.1 10*3/uL (ref 0.0–0.1)
Basophils Relative: 0 %
Eosinophils Absolute: 0.2 10*3/uL (ref 0.0–0.5)
Eosinophils Relative: 1 %
HCT: 42.9 % (ref 36.0–46.0)
Hemoglobin: 14.4 g/dL (ref 12.0–15.0)
Immature Granulocytes: 1 %
Lymphocytes Relative: 14 %
Lymphs Abs: 2.9 10*3/uL (ref 0.7–4.0)
MCH: 32.5 pg (ref 26.0–34.0)
MCHC: 33.6 g/dL (ref 30.0–36.0)
MCV: 96.8 fL (ref 80.0–100.0)
Monocytes Absolute: 1.1 10*3/uL — ABNORMAL HIGH (ref 0.1–1.0)
Monocytes Relative: 5 %
Neutro Abs: 16.7 10*3/uL — ABNORMAL HIGH (ref 1.7–7.7)
Neutrophils Relative %: 79 %
Platelets: 222 10*3/uL (ref 150–400)
RBC: 4.43 MIL/uL (ref 3.87–5.11)
RDW: 13.2 % (ref 11.5–15.5)
WBC: 21.2 10*3/uL — ABNORMAL HIGH (ref 4.0–10.5)
nRBC: 0 % (ref 0.0–0.2)

## 2021-06-03 LAB — RAPID URINE DRUG SCREEN, HOSP PERFORMED
Amphetamines: POSITIVE — AB
Barbiturates: NOT DETECTED
Benzodiazepines: POSITIVE — AB
Cocaine: POSITIVE — AB
Opiates: POSITIVE — AB
Tetrahydrocannabinol: POSITIVE — AB

## 2021-06-03 LAB — ETHANOL: Alcohol, Ethyl (B): <10 mg/dL (ref <10)

## 2021-06-03 LAB — BASIC METABOLIC PANEL
Anion gap: 7 (ref 5–15)
BUN: 9 mg/dL (ref 6–20)
CO2: 24 mmol/L (ref 22–32)
Calcium: 8.4 mg/dL — ABNORMAL LOW (ref 8.9–10.3)
Chloride: 107 mmol/L (ref 98–111)
Creatinine, Ser: 0.86 mg/dL (ref 0.44–1.00)
GFR, Estimated: >60 mL/min (ref >60)
Glucose, Bld: 169 mg/dL — ABNORMAL HIGH (ref 70–99)
Potassium: 4 mmol/L (ref 3.5–5.1)
Sodium: 138 mmol/L (ref 135–145)

## 2021-06-03 LAB — HCG, QUANTITATIVE, PREGNANCY: hCG, Beta Chain, Quant, S: <1 m[IU]/mL (ref <5)

## 2021-06-03 MED ORDER — ONDANSETRON HCL 4 MG/2ML IJ SOLN
4.0000 mg | Freq: Once | INTRAMUSCULAR | Status: AC
Start: 2021-06-03 — End: 2021-06-03
  Administered 2021-06-03: 4 mg via INTRAVENOUS

## 2021-06-03 MED ORDER — NALOXONE HCL 0.4 MG/ML IJ SOLN
0.4000 mg | Freq: Once | INTRAMUSCULAR | Status: AC
Start: 2021-06-03 — End: 2021-06-03
  Administered 2021-06-03: 0.4 mg via INTRAVENOUS

## 2021-06-03 MED ORDER — SODIUM CHLORIDE 0.9 % IV BOLUS
1000.0000 mL | Freq: Once | INTRAVENOUS | Status: AC
  Administered 2021-06-03: 1000 mL via INTRAVENOUS

## 2021-06-03 MED ORDER — ONDANSETRON HCL 4 MG/2ML IJ SOLN
4.0000 mg | Freq: Once | INTRAMUSCULAR | Status: AC
  Administered 2021-06-03: 4 mg via INTRAVENOUS
  Filled 2021-06-03: qty 2

## 2021-06-03 MED ORDER — NALOXONE HCL 2 MG/2ML IJ SOSY
1.0000 mg | PREFILLED_SYRINGE | Freq: Once | INTRAMUSCULAR | Status: AC
Start: 2021-06-03 — End: 2021-06-03
  Administered 2021-06-03: 1 mg via INTRAVENOUS
  Filled 2021-06-03: qty 2

## 2021-06-03 NOTE — ED Notes (Signed)
Pt walked in the hallway with no complaints RN and MD have been notified.  ?

## 2021-06-03 NOTE — Discharge Instructions (Addendum)
It was a pleasure caring for you today in the emergency department. ? ?Please return to the emergency department for any worsening or worrisome symptoms. ?Follow-up with your family doctor next week.  Do not take any medicines or drugs that are not prescribed to you ?

## 2021-06-03 NOTE — ED Notes (Signed)
Pt was acting erratically and verbally aggressive because the ed staff was not answering the pt's call bell in the immediate manner as the pt wanted. Pt was provided the bedpan as requested and a urine sample was obtained. Pt then began crying and apologizing for the outburst, and the pt states they are schizophrenic and bipolar. Pt states that they are not mentally stable, and that they have been HI/SI thoughts for weeks. Pt states that they tried to go to behavioral health, but they did not admit this pt and told this pt that they would need to be medically cleared by a hospital.  ?

## 2021-06-03 NOTE — ED Provider Notes (Signed)
?[facility]-EMERGENCY DEPT ?Provider Note ? ? ?CSN: [id number] ?Arrival date & time: 06/03/21  1338 ? ?  ? ?History ? ?Chief Complaint  ?Patient presents with  ? Drug Overdose  ? ? ?[name] is a 21 y.o. female. ? ? Patient as above with significant medical history as below, including PTSD, asthma, anxiety who presents to the ED with complaint as unresponsive. Per EMS pt was with a group of friends, they were called to site because she was unresponsive, she was given 1mg  Narcan IV with immediate improvement to her mental and respiratory status. Pt reports she took a few bars of xanax. Does report use of an opiate, reports this was recreational, was not attempt at self harm.  ? ? ? ? ?Past Medical History: ?No date: Anxiety ?No date: Asthma ?No date: Headache(784.0) ?No date: Hx of suicide attempt ?No date: Major depressive disorder ?03/25/2020: Morbid obesity (HCC) ?No date: PTSD (post-traumatic stress disorder) ? ?Past Surgical History: ?No date: wisdom tooth extraction  ? ? ?The history is provided by the patient. No language interpreter was used.  ? ?  ? ?Home Medications ?Prior to Admission medications   ?Medication Sig Start Date End Date Taking? Authorizing Provider  ?albuterol (VENTOLIN HFA) 108 (90 Base) MCG/ACT inhaler Inhale 1 puff into the lungs every 6 (six) hours as needed for wheezing or shortness of breath. 03/12/20   Mullis, Kiersten P, DO  ?ARIPiprazole (ABILIFY) 5 MG tablet Take 1 tablet (5 mg total) by mouth daily for 6 days, THEN 2 tablets (10 mg total) daily. For mood control. 05/27/21 06/02/22  08/02/22, PA  ?atomoxetine (STRATTERA) 40 MG capsule Take 1 capsule (40 mg total) by mouth daily. 05/27/21 05/27/22  07/27/22, PA  ?busPIRone (BUSPAR) 10 MG tablet Take 1 tablet (10 mg total) by mouth 2 (two) times daily. For anxiety 05/27/21   07/27/21, PA  ?cetirizine (ZYRTEC) 10 MG tablet Take 1 tablet (10 mg total) by mouth daily. 03/12/20   Mullis, Kiersten P, DO   ?escitalopram (LEXAPRO) 10 MG tablet Take 1 tablet (10 mg total) by mouth daily. For depression 05/27/21   07/27/21, PA  ?lamoTRIgine (LAMICTAL) 25 MG tablet Take 1 tablet (25 mg total) by mouth daily. For mood stabilization 05/27/21   Nwoko, 07/27/21, PA  ?lamoTRIgine (LAMICTAL) 25 MG tablet Take 2 tablets (50 mg total) by mouth daily. 05/27/21 05/27/22  Nwoko, 07/27/22, PA  ?nicotine (NICODERM CQ - DOSED IN MG/24 HOURS) 21 mg/24hr patch Place 1 patch (21 mg total) onto the skin daily. (May buy from over the counter): For smoking cessation 05/27/21   07/27/21, PA  ?oseltamivir (TAMIFLU) 75 MG capsule Take 1 capsule (75 mg total) by mouth every 12 (twelve) hours. 12/15/20   12/17/20, PA-C  ?prazosin (MINIPRESS) 1 MG capsule TAKE 1 CAPSULE(1 MG) BY MOUTH AT BEDTIME FOR NIGHTMARES 05/30/21   Nwoko, 07/30/21, PA  ?trazodone (DESYREL) 300 MG tablet Take 1 tablet (300 mg total) by mouth at bedtime. For sleep. Patient to take half-tablet as needed. 05/27/21   07/27/21, PA  ?   ? ?Allergies    ?Lactose intolerance (gi) and Tape   ? ?Review of Systems   ?Review of Systems  ?Constitutional:  Negative for chills and fever.  ?HENT:  Negative for facial swelling and trouble swallowing.   ?Eyes:  Negative for photophobia and visual disturbance.  ?Respiratory:  Positive for cough. Negative for  shortness of breath.   ?Cardiovascular:  Negative for chest pain and palpitations.  ?Gastrointestinal:  Positive for nausea. Negative for abdominal pain and vomiting.  ?Endocrine: Negative for polydipsia and polyuria.  ?Genitourinary:  Negative for difficulty urinating and hematuria.  ?Musculoskeletal:  Negative for gait problem and joint swelling.  ?Skin:  Negative for pallor and rash.  ?Neurological:  Negative for syncope and headaches.  ?Psychiatric/Behavioral:  Negative for agitation and confusion.   ? ?Physical Exam ?Updated Vital Signs ?BP (!) 162/88   Pulse 84   Temp 98.9 ?F (37.2 ?C) (Oral)   Resp 13   SpO2  100%  ?Physical Exam ?Vitals and nursing note reviewed.  ?Constitutional:   ?   General: She is not in acute distress. ?   Appearance: Normal appearance. She is obese. She is not diaphoretic.  ?HENT:  ?   Head: Normocephalic and atraumatic.  ?   Right Ear: External ear normal.  ?   Left Ear: External ear normal.  ?   Nose: Nose normal.  ?   Mouth/Throat:  ?   Mouth: Mucous membranes are moist.  ?Eyes:  ?   General: No scleral icterus.    ?   Right eye: No discharge.     ?   Left eye: No discharge.  ?Cardiovascular:  ?   Rate and Rhythm: Normal rate and regular rhythm.  ?   Pulses: Normal pulses.  ?   Heart sounds: Normal heart sounds.  ?Pulmonary:  ?   Effort: Pulmonary effort is normal. No respiratory distress.  ?   Breath sounds: Normal breath sounds.  ?Abdominal:  ?   General: Abdomen is flat.  ?   Palpations: Abdomen is soft.  ?   Tenderness: There is no abdominal tenderness.  ?Musculoskeletal:     ?   General: Normal range of motion.  ?   Cervical back: Normal range of motion.  ?   Right lower leg: No edema.  ?   Left lower leg: No edema.  ?Skin: ?   General: Skin is warm and dry.  ?   Capillary Refill: Capillary refill takes less than 2 seconds.  ?Neurological:  ?   Mental Status: She is alert.  ?Psychiatric:     ?   Mood and Affect: Mood normal.     ?   Behavior: Behavior normal.  ? ? ?ED Results / Procedures / Treatments   ?Labs ?(all labs ordered are listed, but only abnormal results are displayed) ? ?EKG ?EKG Interpretation ? ?Date/Time:  Wednesday Jun 03 2021 13:44:26 EDT ?Ventricular Rate:  131 ?PR Interval:  118 ?QRS Duration: 80 ?QT Interval:  250 ?QTC Calculation: 369 ?R Axis:   47 ?Text Interpretation: Sinus tachycardia Confirmed by Tanda Rockers ([id number]) on 06/03/2021 7:21:49 PM ? ?RadiologyDG Chest Portable 1 View ? ?Result Date: 06/03/2021 ?CLINICAL DATA:  Concern for overdose. EXAM: PORTABLE CHEST 1 VIEW COMPARISON:  02/19/2019 FINDINGS: Single-view of the chest demonstrates low lung volumes. No focal  lung disease. Heart size is normal for lung volumes. There is gas within the stomach. Negative for a pneumothorax. IMPRESSION: Low lung volumes without focal disease. Electronically Signed   By: Richarda Overlie M.D.   On: 06/03/2021 14:23   ? ?Procedures ?Procedures  ? ? ?Medications Ordered in ED ?Medications  ?ondansetron (ZOFRAN) injection 4 mg (4 mg Intravenous Given 06/03/21 1401)  ?sodium chloride 0.9 % bolus 1,000 mL (0 mLs Intravenous Stopped 06/03/21 1749)  ?naloxone (NARCAN) injection 0.4 mg (0.4 mg Intravenous  Given 06/03/21 1407)  ?naloxone [facility](NARCAN) injection 1 mg (1 mg Intravenous Given 06/03/21 1749)  ? ? ?ED Course/ Medical Decision Making/ A&P ?  ?                        ?Medical Decision Making ?Amount and/or Complexity of Data Reviewed ?Labs: ordered. ?Radiology: ordered. ? ?Risk ?Prescription drug management. ? ? ? ?CC: overdose ? ?This patient presents to the Emergency Department for the above complaint. This involves an extensive number of treatment options and is a complaint that carries with it a high risk of complications and morbidity. Vital signs were reviewed. Serious etiologies considered. ? ?Tachycardic on arrival, pt is wretching; attempting to stick her finger down her throat to make herself vomit. She was given zofran and pt HR greatly improved, nausea improved. ? ?Record review:  ?Previous records obtained and reviewed  ?Prior admission, prior labs and imaging ? ?Additional history obtained from EMS ? ?Medical and surgical history as noted above.  ? ?Work up as above, notable for:  ?Labs & imaging results that were available during my care of the patient were visualized by me and considered in my medical decision making. ?  ?I ordered imaging studies which included CXR and I visualized the imaging and I agree with radiologist interpretation. No acute changes ? ?Cardiac monitoring reviewed and interpreted personally which shows NSR ? ?Management: ?Given narcan, zofran ? ?Reassessment:  ?Pt  with apparent overdose on narcotic combined with benzo use and etoh. ? ?Improvement in mental status, resp status. Was given repeat dose of narcan in ED with continued improvement. Pt is still somewhat sleepy.

## 2021-06-03 NOTE — ED Triage Notes (Signed)
Pt BIB EMS from home. Friends called in d/t unresponsive. Found lying on the floor in home, unable to flollow commands. Narcan 1 mg administered in route, within a few minutes 1 mg narcan administered. Pt stated "I just took two bars of xanax".  ? ?BP 134/88 ?P 96 ?RR 6-8 pre-narcan 20 after ?CBG 164 ?spO2 100% nonrebreather  ?

## 2021-06-16 ENCOUNTER — Encounter (HOSPITAL_COMMUNITY): Payer: Medicaid Other | Admitting: Physician Assistant

## 2021-06-30 ENCOUNTER — Encounter: Payer: Self-pay | Admitting: *Deleted

## 2021-07-08 ENCOUNTER — Encounter (HOSPITAL_COMMUNITY): Payer: Self-pay | Admitting: Physician Assistant

## 2021-08-04 ENCOUNTER — Encounter (HOSPITAL_COMMUNITY): Payer: Self-pay | Admitting: Registered Nurse

## 2021-08-04 ENCOUNTER — Ambulatory Visit (HOSPITAL_COMMUNITY)
Admission: EM | Admit: 2021-08-04 | Discharge: 2021-08-04 | Disposition: A | Discharge status: Other Acute Inpatient Hospital | Payer: Medicaid Other | Attending: Registered Nurse | Admitting: Registered Nurse

## 2021-08-04 ENCOUNTER — Emergency Department (HOSPITAL_COMMUNITY)
Admission: EM | Admit: 2021-08-04 | Discharge: 2021-08-04 | Discharge status: Against Medical Advice | Payer: Medicaid Other

## 2021-08-04 DIAGNOSIS — R45850 Homicidal ideations: Secondary | ICD-10-CM | POA: Insufficient documentation

## 2021-08-04 DIAGNOSIS — F1514 Other stimulant abuse with stimulant-induced mood disorder: Secondary | ICD-10-CM | POA: Diagnosis not present

## 2021-08-04 DIAGNOSIS — F4310 Post-traumatic stress disorder, unspecified: Secondary | ICD-10-CM | POA: Diagnosis not present

## 2021-08-04 DIAGNOSIS — F1994 Other psychoactive substance use, unspecified with psychoactive substance-induced mood disorder: Secondary | ICD-10-CM | POA: Diagnosis present

## 2021-08-04 DIAGNOSIS — F17210 Nicotine dependence, cigarettes, uncomplicated: Secondary | ICD-10-CM | POA: Insufficient documentation

## 2021-08-04 DIAGNOSIS — F1490 Cocaine use, unspecified, uncomplicated: Secondary | ICD-10-CM | POA: Insufficient documentation

## 2021-08-04 DIAGNOSIS — F1290 Cannabis use, unspecified, uncomplicated: Secondary | ICD-10-CM | POA: Insufficient documentation

## 2021-08-04 DIAGNOSIS — R45851 Suicidal ideations: Secondary | ICD-10-CM | POA: Insufficient documentation

## 2021-08-04 DIAGNOSIS — F333 Major depressive disorder, recurrent, severe with psychotic symptoms: Secondary | ICD-10-CM | POA: Diagnosis not present

## 2021-08-04 DIAGNOSIS — F1090 Alcohol use, unspecified, uncomplicated: Secondary | ICD-10-CM | POA: Insufficient documentation

## 2021-08-04 DIAGNOSIS — F603 Borderline personality disorder: Secondary | ICD-10-CM | POA: Insufficient documentation

## 2021-08-04 DIAGNOSIS — F1910 Other psychoactive substance abuse, uncomplicated: Secondary | ICD-10-CM

## 2021-08-04 NOTE — ED Provider Notes (Signed)
Behavioral Health Urgent Care Medical Screening Exam  Patient Name: [name] MRN: [id number] Date of Evaluation: 08/04/21 Chief Complaint:   Diagnosis:  Final diagnoses:  Polysubstance abuse (HCC)  PTSD (post-traumatic stress disorder)  Substance induced mood disorder (HCC)  Borderline personality disorder (HCC)    History of Present illness: [name] is a 21 y.o. female patient presented to [facility] as a walk in with complaints of suicidal ideation with a plan to overdose.  [name], 21 y.o., female patient seen face to face by this provider, consulted with Dr. Earlene Plater; and chart reviewed on 08/04/21.  On evaluation [name] reports her stressors are about to be evicted from her apartment and that she is unable to cope with life right now.  "I Feel like I'm out of my damn mind I've tried to kill myself so many times and I just wake up the next day. I've almost died three times in the last three months and I'm like I want to die and just stay dead." patient reports the history of borderline personality disorder, PTSD, ADHD, major depressive disorder, generalized anxiety disorder. Reports she was going to be [facility] for outpatient services that hasn't been in a while. Before she has been off her psychotropic medications for almost a year. Currently patient also states that she is having auditory and visual hallucinations but does not elaborate on what the voices are saying or what she is seeing. Patient is endorsing homicidal ideation stating she wants to kill her cousin and me and from her past. Reporting that her homicidal thoughts are more passive with no intent or plan. Patient states with withdrawal she also has seizures. Space the last time she went through withdrawal she had two seizures. [facility] she also records that her support system includes her mother-in-law and her grandparents. patient is  unable to contract for safety.   Patient reports police substance abuse (methamphetamine, crack cocaine, marijuana, and alcohol) patient states she drank alcohol almost daily "I start out with beer and if I don't have any beer I drink about 1/5 of liquor." patient reports her last use of alcohol was yesterday 08/03/21. Reports her last use of crack cocaine was this morning 08/04/2021, and I last use of methamphetamine was about two to three days ago. Reports prior psychotropic medications are Lexapro, Buspar, and by lower pain, Trazodone, Prazosin. Report said the Prazosin was for her blood pressure and nightmares.  Patient also reporting a history of seizures with withdrawal period she was informed because of her seizure history she would have to be transported to the emergency room.  Prior to assessment unable to locate patient she was found outside by security smoking a cigarette.  During evaluation [name] is sitting upright in char with no noted distress.  (position) in no acute distress.  She is alert/oriented x 4; calm/cooperative; and mood congruent with affect.  She is speaking in a clear tone at moderate volume, and normal pace; with good eye contact.  Her thought process is coherent and relevant; There is no indication that she is currently responding to internal/external stimuli or experiencing delusional thought content However she is endorsing auditory/visual hallucinations.  She continues to endorse active suicidal ideation, unable to contract for safety, and passive homicidal ideation.  She denies paranoia.      Psychiatric Specialty Exam  Presentation  General Appearance:Appropriate for Environment  Eye Contact:Good  Speech:Clear and Coherent; Normal Rate  Speech Volume:Normal  Handedness:Right   Mood and Affect  Mood:Anxious; Depressed  Affect:Congruent   Thought Process  Thought Processes:Coherent; Goal Directed  Descriptions of  Associations:Intact  Orientation:Full (Time, Place and Person)  Thought Content:Logical  Diagnosis of Schizophrenia or Schizoaffective disorder in past: No data recorded Duration of Psychotic Symptoms: Greater than six months  Hallucinations:Auditory NA States she is hearing voices but doesn't elaborate on what voices are saying Spirits, demons, and ttear things down  Ideas of Reference:None  Suicidal Thoughts:Yes, Active With Intent; With Plan; With Means to Carry Out With Plan; With Intent  Homicidal Thoughts:Yes, Passive Without Intent; Without Plan   Sensorium  Memory:Immediate Good; Recent Good; Remote Good  Judgment:Fair  Insight:Shallow   Executive Functions  Concentration:Good  Attention Span:Good  Recall:Good  Fund of Knowledge:Good  Language:Good   Psychomotor Activity  Psychomotor Activity:Normal   Assets  Assets:Communication Skills; Desire for Improvement; Housing; Physical Health; Social Support   Sleep  Sleep:Good  Number of hours: 4   Nutritional Assessment (For OBS and FBC admissions only) Has the patient had a weight loss or gain of 10 pounds or more in the last 3 months?: No Has the patient had a decrease in food intake/or appetite?: No Does the patient have dental problems?: No Does the patient have eating habits or behaviors that may be indicators of an eating disorder including binging or inducing vomiting?: No Has the patient recently lost weight without trying?: 0 Has the patient been eating poorly because of a decreased appetite?: 0 Malnutrition Screening Tool Score: 0    Physical Exam: Physical Exam Vitals and nursing note reviewed. Exam conducted with a chaperone present.  Constitutional:      General: She is not in acute distress.    Appearance: Normal appearance. She is not ill-appearing.  Cardiovascular:     Rate and Rhythm: Normal rate.  Pulmonary:     Effort: Pulmonary effort is normal.  Musculoskeletal:         General: Normal range of motion.     Cervical back: Normal range of motion.  Skin:    General: Skin is warm and dry.  Neurological:     Mental Status: She is alert and oriented to person, place, and time.  Psychiatric:        Attention and Perception: She perceives auditory and visual hallucinations.        Mood and Affect: Mood is anxious and depressed.        Speech: Speech normal.        Behavior: Behavior normal. Behavior is cooperative.        Thought Content: Thought content is not delusional. Thought content includes homicidal and suicidal ideation.        Cognition and Memory: Cognition normal.        Judgment: Judgment is impulsive.    Review of Systems  Constitutional: Negative.   HENT: Negative.    Eyes: Negative.   Respiratory: Negative.    Cardiovascular:  Negative for chest pain and palpitations.       History of HTN  Gastrointestinal: Negative.   Genitourinary: Negative.   Musculoskeletal: Negative.   Skin: Negative.   Neurological:  Positive for seizures (History of seizure with withdrawal).  Endo/Heme/Allergies: Negative.   Psychiatric/Behavioral:  Positive for depression, hallucinations, substance abuse and suicidal ideas. The patient is nervous/anxious.    Blood pressure 123/80, pulse (!) 108, temperature 98.8 F (37.1 C), temperature source Oral, resp. rate 19, SpO2 100 %. There is no height  or weight on file to calculate BMI.  Musculoskeletal: Strength & Muscle Tone: within normal limits Gait & Station: normal Patient leans: N/A   [facility] MSE Discharge Disposition for Follow up and Recommendations: Based on my evaluation I certify that psychiatric inpatient services furnished can reasonably be expected to improve the patient's condition which I recommend transfer to an appropriate accepting facility.  Will transfer to [facility] ED for medical clearance and to hold until appropriate bed located related to history of seizures.     Spoke to Dr. Jacalyn Lefevre at  [facility] ED informed that patient being transferred to ED related to history of seizures with withdrawal.  Patient accepted.     Yolanda Dahm, NP 08/04/2021, 4:46 PM

## 2021-08-04 NOTE — Discharge Instructions (Addendum)
Substance Abuse Treatment Programs ° °Intensive Outpatient Programs °[facility]     °[address]      °[place], [place]                   °[phone]      ° °[facility] °[address] °[place], [place] °[phone] ° °[facility]     °(Inpatient and outpatient)     °[address]           °[phone]   ° °[facility] °[phone] (Suboxone and Methadone) ° °[address]      °[place], [place] [address]      °[phone]      ° °[address] °[place], [place] °[phone] ° °[facility] (Outpatient/Inpatient, [facility])    °(insurance only) [phone]      °       °[facility] (Groups & Residential) °[place], [place] °[phone] ° °   °[facility]     °[address]     °[place], [place]      °[phone]      ° °[facility] (for caregivers and family) °[address] °[place], [place] °336-299-4655 ° ° ° ° ° °Residential Treatment Programs °[facility]      °[address], [place], [place] [address]  °[phone]      ° °[facility] °[address] [place], [place] [address] °[phone] ° °[facility]        °336-248-8914      ° °[facility] °1-800-659-3381 ° °[facility]             °[address]                                         °[place], [place]                                                °[phone] or [phone]                              ° °[facility] [place] °[address] °[place], [address] °[phone] ° °[facility]    °[address]      °[place], [place]     °[phone]      ° °[facility] °[address] °[place], [place] °336-285-9073 ° °[facility]   °[address]     °[place], [place] [address]     °[phone]      °Admissions: 8am-3pm M-F ° °[facility] °[address] °[place],  [place] °336-227-7417 ° °[facility]: Residential Program (90 Days)   °[place], [place]      °336-725-8389 or [phone]    ° °[facility] °[place], [place] °(Walk in Hours over the weekend or by referral) ° °[facility] °[address], [place], [place] [address] °([phone] ° °C[facility] Mobile: [facility]:  1-877-626-1772 (for crisis response 24 hours a day) °[facility] Hotline:      1-800-256-2452 °Outpatient Psychiatry and Counseling ° °[facility]: Mobile Crisis   Management 24 hours:  1-877-626-1772 ° °[facility] sliding scale fee and walk in schedule: M-F 8am-12pm/1pm-3pm °[address]  °[place], [place] [address] °[phone] ° °[facility] °[address] °[place], [place] [address] °[phone] ° °[facility] ([facility])- new patient walk-in appointments available Monday - Friday 8am -3pm.          °[address] °[place], [place] [address] °336-676-6840 or crisis line- 336-676-6905 ° °[facility] Intensive Outpatient Therapy Program °[address] °[place], [place] [address] °336-832-9804 ° °[facility]                  °Crisis Services      °[phone]      °[address]     °[place], [place] [address]                ° °[facility]   °[facility] °[phone] °[address] °[place], [place] [address] ° ° °[facility]          °[address]  °[place], [place] [address]       °[phone] ° °[facility] °[address] °[place], [place] [address] °336-292-1510 ° °[facility]    °[address]    °[place], [place] [address]     °[phone]      ° °Parish McKinney, MD     °[address]     °[place] [address]     °[phone]     °  °[facility] °[address] °[place] [address] ° °[facility]     °[address]     °[place], [place]      °336-542-2076      ° °[facility] °Shamsher Ahluwalia, MD °[address] °[place], [place] [address] °[phone] ° °[facility]     °[address]     °[place], [place] [address]     °336-274-1237      ° °[facility] °[address] °[place], [place] [address] °336-854-4450 °Resources for Temporary Residential Assistance/Crisis Centers ° °DAY CENTERS °[facility] °M-F 8am-3pm   °[address] [place], [place] [address]   336-332-0824 °Services include: laundry, barbering, support groups, case management, phone  & computer access, showers, AA/NA mtgs, mental health/substance abuse nurse, job skills class, disability information, VA assistance, spiritual classes, etc.  ° °HOMELESS SHELTERS ° °[facility]     °[facility]   °[address], [place]     °[phone]       °       °[facility] (women and children)       °[address] °[place], [place] [address] °336-275-0820 °[email or web address] for application and process °Application Required ° °[facility]   °[address]    °[place] [address]     °[phone]       °             °[facility] °[address] °, [place] [address] °336.273.5572 °336-235-0363(schedule application appt.) °Application Required ° °[facility] (women only)    °[address]     °[place], [place] [address]     °336-884-1039      °  Intake starts 6pm daily °Need valid ID, SSC, & Police report °[facility] [place] °[address] °[place], [place] °336-881-5420 °Application Required ° °[facility] (men only)     °[address]      °[place], [place]     °[phone]      ° °[facility] °(Pregnant women only) °[address] °[place], [place] °336-275-0206 ° °[facility]      °[address]      °[place], [place] [address]     °[phone]      °       °[facility] °[address] °[place], [place] °336-723-1848 °90 day commitment/SA/Application process ° °[facility](men only)     °[address]     °[place], [place]     °[phone]       °Check-in at 7pm     °       °[facility] °[address] °[place], [place] [address] °336-248-6684 °Men/Women/Women and Children must be there by 7 pm ° °[facility] °[place], [place] °336-722-8721                ° °
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
## 2021-08-04 NOTE — ED Triage Notes (Signed)
PER ems: pt was sent from [facility] with SI and drug use. She reports using coke and weed today.   BP-140/98, HR-80, RR-16, 100% RA.

## 2021-08-04 NOTE — ED Notes (Signed)
Upon arrival the patient was placed in a triage room to await for this RN to triage her. When this RN went to do her triage, she was not in her room and I am unable to locate the patient to finish her triage. Name called x 3. Charge RN aware that the patient left.
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
## 2021-08-04 NOTE — Progress Notes (Signed)
The charge nurse was called times 2 without success to give report on [name]. Her voice mail has not been set up and the second call was the secretary who transferred this writer to her hone with the exact same message. This Clinical research associate will continue to call the charge nurse at [facility].

## 2021-08-04 NOTE — Progress Notes (Signed)
Non emergent EMS called at request of NP.
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
## 2021-09-08 ENCOUNTER — Encounter: Payer: Medicaid Other | Admitting: Student

## 2021-09-23 ENCOUNTER — Telehealth (HOSPITAL_COMMUNITY): Payer: Self-pay | Admitting: *Deleted

## 2021-09-23 NOTE — Telephone Encounter (Signed)
Received a fax request from [facility] on [address] for a new rx for her lamotrigine. Called pharmacy to understand when last filled as per chart last written on 05/27/21 and it was for a month with a refill so should have been out around the 3rd of July. She picked up the 25 mg #14 rx on 8/11 which to be taking the correct dose would have only last 7 days. She has an appt with her provider on 09/29/21. Her provider here is [place] and he is out of the office today. I will forward this request to him to consider on his return as it doesn't appear she is very compliant with her Lamotrigine and will be able to wait till tomorrow for his response.

## 2021-09-29 ENCOUNTER — Encounter (HOSPITAL_COMMUNITY): Payer: Medicaid Other | Admitting: Physician Assistant

## 2021-09-30 ENCOUNTER — Encounter: Payer: Medicaid Other | Admitting: Student

## 2022-04-13 ENCOUNTER — Ambulatory Visit (HOSPITAL_COMMUNITY)
Admission: EM | Admit: 2022-04-13 | Discharge: 2022-04-15 | Disposition: A | Discharge status: Other Acute Inpatient Hospital | Payer: Medicaid Other | Attending: Psychiatry | Admitting: Psychiatry

## 2022-04-13 DIAGNOSIS — Z1152 Encounter for screening for COVID-19: Secondary | ICD-10-CM | POA: Insufficient documentation

## 2022-04-13 DIAGNOSIS — F315 Bipolar disorder, current episode depressed, severe, with psychotic features: Secondary | ICD-10-CM | POA: Diagnosis not present

## 2022-04-13 DIAGNOSIS — R45851 Suicidal ideations: Secondary | ICD-10-CM | POA: Diagnosis not present

## 2022-04-13 DIAGNOSIS — F1210 Cannabis abuse, uncomplicated: Secondary | ICD-10-CM | POA: Diagnosis not present

## 2022-04-13 DIAGNOSIS — Z9151 Personal history of suicidal behavior: Secondary | ICD-10-CM | POA: Insufficient documentation

## 2022-04-13 DIAGNOSIS — Z79899 Other long term (current) drug therapy: Secondary | ICD-10-CM | POA: Insufficient documentation

## 2022-04-13 DIAGNOSIS — R4589 Other symptoms and signs involving emotional state: Secondary | ICD-10-CM

## 2022-04-13 LAB — CBC WITH DIFFERENTIAL/PLATELET
Abs Immature Granulocytes: 0.02 10*3/uL (ref 0.00–0.07)
Basophils Absolute: 0 10*3/uL (ref 0.0–0.1)
Basophils Relative: 0 %
Eosinophils Absolute: 0.1 10*3/uL (ref 0.0–0.5)
Eosinophils Relative: 1 %
HCT: 41.3 % (ref 36.0–46.0)
Hemoglobin: 14.2 g/dL (ref 12.0–15.0)
Immature Granulocytes: 0 %
Lymphocytes Relative: 24 %
Lymphs Abs: 2.1 10*3/uL (ref 0.7–4.0)
MCH: 32.5 pg (ref 26.0–34.0)
MCHC: 34.4 g/dL (ref 30.0–36.0)
MCV: 94.5 fL (ref 80.0–100.0)
Monocytes Absolute: 0.7 10*3/uL (ref 0.1–1.0)
Monocytes Relative: 8 %
Neutro Abs: 6 10*3/uL (ref 1.7–7.7)
Neutrophils Relative %: 67 %
Platelets: 193 10*3/uL (ref 150–400)
RBC: 4.37 MIL/uL (ref 3.87–5.11)
RDW: 12.7 % (ref 11.5–15.5)
WBC: 9 10*3/uL (ref 4.0–10.5)
nRBC: 0 % (ref 0.0–0.2)

## 2022-04-13 LAB — POCT PREGNANCY, URINE: Preg Test, Ur: NEGATIVE

## 2022-04-13 LAB — POCT URINE DRUG SCREEN - MANUAL ENTRY (I-SCREEN)
POC Amphetamine UR: NOT DETECTED
POC Buprenorphine (BUP): NOT DETECTED
POC Cocaine UR: NOT DETECTED
POC Marijuana UR: POSITIVE — AB
POC Methadone UR: NOT DETECTED
POC Methamphetamine UR: NOT DETECTED
POC Morphine: NOT DETECTED
POC Oxazepam (BZO): NOT DETECTED
POC Oxycodone UR: NOT DETECTED
POC Secobarbital (BAR): NOT DETECTED

## 2022-04-13 LAB — POC URINE PREG, ED: Preg Test, Ur: NEGATIVE

## 2022-04-13 LAB — POC SARS CORONAVIRUS 2 AG: SARSCOV2ONAVIRUS 2 AG: NEGATIVE

## 2022-04-13 MED ORDER — LORAZEPAM 1 MG PO TABS: 1.0000 mg | ORAL_TABLET | ORAL | Status: DC | PRN

## 2022-04-13 MED ORDER — ACETAMINOPHEN 325 MG PO TABS: 650.0000 mg | ORAL_TABLET | Freq: Four times a day (QID) | ORAL | Status: DC | PRN

## 2022-04-13 MED ORDER — ALUM & MAG HYDROXIDE-SIMETH 200-200-20 MG/5ML PO SUSP: 30.0000 mL | ORAL | Status: DC | PRN

## 2022-04-13 MED ORDER — MAGNESIUM HYDROXIDE 400 MG/5ML PO SUSP: 30.0000 mL | Freq: Every day | ORAL | Status: DC | PRN

## 2022-04-13 MED ORDER — ZIPRASIDONE MESYLATE 20 MG IM SOLR: 20.0000 mg | INTRAMUSCULAR | Status: DC | PRN

## 2022-04-13 MED ORDER — OLANZAPINE 5 MG PO TBDP: 5.0000 mg | ORAL_TABLET | Freq: Three times a day (TID) | ORAL | Status: DC | PRN
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
## 2022-04-13 NOTE — ED Provider Notes (Signed)
[facility] Urgent Care Continuous Assessment Admission H&P  Date: 04/14/22 Patient Name: [name] MRN: [id number] Chief Complaint: SI with plan to OD on medication   Diagnoses:  Final diagnoses:  Suicidal ideation  Marijuana abuse  Anxious appearance    HPI: [name],  22 y/o female with a history of suicide ideation, depression, substance abuse mood disorder, presented to [facility] c/o of suicidal ideation with plans to overdose on medication.  Patient was last hospitalized in February for suicide attempt.  Patient appears to be very depressed and very soft spoken.  According to patient she lives with her grandfather and her father.  According to patient she is depressed and does have suicidal thoughts.  Patient was discharge from [facility] a week ago and is still taking the medicine prescribed to her upon discharge according to patient she is not currently seeing a psychiatrist or therapist.  Face-to-face observation of patient, patient is alert and oriented x 4, speech is clear, maintaining eye contact.  Patient is soft spoken.  Does appear to be very depressed and anxious.  Patient endorsed suicidal ideation with plans to overdose on medication.  Patient reports she smoked marijuana daily.  Patient denies alcohol use.  Patient denies HI, patient report auditory hallucination voice telling her she should not be here.  Patient does not seem to be influenced by external or internal stimuli at this time.  Recommend inpatient observation with possible plan for inpatient admission when a bed becomes available.  Total Time spent with patient: 30 minutes  Musculoskeletal  Strength & Muscle Tone: within normal limits Gait & Station: normal Patient leans: N/A  Psychiatric Specialty Exam  Presentation General Appearance:  Casual  Eye Contact: Fair  Speech: Clear and Coherent  Speech Volume: Normal  Handedness: Right   Mood and Affect  Mood: Anxious;  Depressed  Affect: Appropriate   Thought Process  Thought Processes: Coherent  Descriptions of Associations:Circumstantial  Orientation:Full (Time, Place and Person)  Thought Content:WDL  Diagnosis of Schizophrenia or Schizoaffective disorder in past: No   Hallucinations:Hallucinations: Auditory Description of Auditory Hallucinations: that i shouldnt be here  Ideas of Reference:None  Suicidal Thoughts:Suicidal Thoughts: Yes, Active SI Active Intent and/or Plan: With Intent; With Plan  Homicidal Thoughts:Homicidal Thoughts: No   Sensorium  Memory: Immediate Fair  Judgment: Poor  Insight: Shallow   Executive Functions  Concentration: Good  Attention Span: Good  Recall: Good  Fund of Knowledge: Good  Language: Good   Psychomotor Activity  Psychomotor Activity: Psychomotor Activity: Normal   Assets  Assets: Desire for Improvement; Resilience   Sleep  Sleep: Sleep: Poor Number of Hours of Sleep: 4   Nutritional Assessment (For OBS and FBC admissions only) Has the patient had a weight loss or gain of 10 pounds or more in the last 3 months?: No Has the patient had a decrease in food intake/or appetite?: No Does the patient have dental problems?: No Does the patient have eating habits or behaviors that may be indicators of an eating disorder including binging or inducing vomiting?: No Has the patient recently lost weight without trying?: 0 Has the patient been eating poorly because of a decreased appetite?: 0 Malnutrition Screening Tool Score: 0    Physical Exam HENT:     Head: Normocephalic.     Nose: Nose normal.  Cardiovascular:     Rate and Rhythm: Normal rate.  Pulmonary:     Effort: Pulmonary effort is normal.  Musculoskeletal:        General:  Normal range of motion.     Cervical back: Normal range of motion.  Neurological:     General: No focal deficit present.     Mental Status: She is alert.  Psychiatric:        Mood  and Affect: Mood normal.        Behavior: Behavior normal.        Thought Content: Thought content normal.        Judgment: Judgment normal.    Review of Systems  Constitutional: Negative.   HENT: Negative.    Eyes: Negative.   Respiratory: Negative.    Cardiovascular: Negative.   Gastrointestinal: Negative.   Genitourinary: Negative.   Musculoskeletal: Negative.   Skin: Negative.   Neurological: Negative.   Psychiatric/Behavioral:  Positive for depression, substance abuse and suicidal ideas. The patient is nervous/anxious.     Blood pressure 122/75, pulse (!) 102, temperature 98.7 F (37.1 C), temperature source Oral, resp. rate 16, SpO2 100 %. There is no height or weight on file to calculate BMI.  Past Psychiatric History: Suicidal ideation, intentional overdose.  Is the patient at risk to self? Yes  Has the patient been a risk to self in the past 6 months? Yes .    Has the patient been a risk to self within the distant past? Yes   Is the patient a risk to others? No   Has the patient been a risk to others in the past 6 months? No   Has the patient been a risk to others within the distant past? No   Past Medical History: See chart  Family History: Unknown  Social History: Marijuana use  Last Labs:  Admission on 04/13/2022  Component Date Value Ref Range Status   SARS Coronavirus 2 by RT PCR 04/13/2022 NEGATIVE  NEGATIVE Final   Influenza A by PCR 04/13/2022 NEGATIVE  NEGATIVE Final   Influenza B by PCR 04/13/2022 NEGATIVE  NEGATIVE Final   Comment: (NOTE) The Xpert Xpress SARS-CoV-2/FLU/RSV plus assay is intended as an aid in the diagnosis of influenza from Nasopharyngeal swab specimens and should not be used as a sole basis for treatment. Nasal washings and aspirates are unacceptable for Xpert Xpress SARS-CoV-2/FLU/RSV testing.  Fact Sheet for Patients: EntrepreneurPulse.com.au  Fact Sheet for Healthcare  Providers: IncredibleEmployment.be  This test is not yet approved or cleared by the [place] FDA and has been authorized for detection and/or diagnosis of SARS-CoV-2 by FDA under an Emergency Use Authorization (EUA). This EUA will remain in effect (meaning this test can be used) for the duration of the COVID-19 declaration under Section 564(b)(1) of the Act, 21 U.S.C. section 360bbb-3(b)(1), unless the authorization is terminated or revoked.     Resp Syncytial Virus by PCR 04/13/2022 NEGATIVE  NEGATIVE Final   Comment: (NOTE) Fact Sheet for Patients: EntrepreneurPulse.com.au  Fact Sheet for Healthcare Providers: IncredibleEmployment.be  This test is not yet approved or cleared by the [place] FDA and has been authorized for detection and/or diagnosis of SARS-CoV-2 by FDA under an Emergency Use Authorization (EUA). This EUA will remain in effect (meaning this test can be used) for the duration of the COVID-19 declaration under Section 564(b)(1) of the Act, 21 U.S.C. section 360bbb-3(b)(1), unless the authorization is terminated or revoked.  Performed at [facility], [place] [address], [place], [place] [address]    WBC 04/13/2022 9.0  4.0 - 10.5 K/uL Final   RBC 04/13/2022 4.37  3.87 - 5.11 MIL/uL Final   Hemoglobin  04/13/2022 14.2  12.0 - 15.0 g/dL Final   HCT 04/13/2022 41.3  36.0 - 46.0 % Final   MCV 04/13/2022 94.5  80.0 - 100.0 fL Final   MCH 04/13/2022 32.5  26.0 - 34.0 pg Final   MCHC 04/13/2022 34.4  30.0 - 36.0 g/dL Final   RDW 04/13/2022 12.7  11.5 - 15.5 % Final   Platelets 04/13/2022 193  150 - 400 K/uL Final   nRBC 04/13/2022 0.0  0.0 - 0.2 % Final   Neutrophils Relative % 04/13/2022 67  % Final   Neutro Abs 04/13/2022 6.0  1.7 - 7.7 K/uL Final   Lymphocytes Relative 04/13/2022 24  % Final   Lymphs Abs 04/13/2022 2.1  0.7 - 4.0 K/uL Final   Monocytes Relative 04/13/2022 8  % Final   Monocytes  Absolute 04/13/2022 0.7  0.1 - 1.0 K/uL Final   Eosinophils Relative 04/13/2022 1  % Final   Eosinophils Absolute 04/13/2022 0.1  0.0 - 0.5 K/uL Final   Basophils Relative 04/13/2022 0  % Final   Basophils Absolute 04/13/2022 0.0  0.0 - 0.1 K/uL Final   Immature Granulocytes 04/13/2022 0  % Final   Abs Immature Granulocytes 04/13/2022 0.02  0.00 - 0.07 K/uL Final   Performed at [facility], [place] [address] [place], [place] [address]   Sodium 04/13/2022 142  135 - 145 mmol/L Final   Potassium 04/13/2022 3.9  3.5 - 5.1 mmol/L Final   Chloride 04/13/2022 103  98 - 111 mmol/L Final   CO2 04/13/2022 27  22 - 32 mmol/L Final   Glucose, Bld 04/13/2022 79  70 - 99 mg/dL Final   Glucose reference range applies only to samples taken after fasting for at least 8 hours.   BUN 04/13/2022 12  6 - 20 mg/dL Final   Creatinine, Ser 04/13/2022 0.73  0.44 - 1.00 mg/dL Final   Calcium 04/13/2022 9.1  8.9 - 10.3 mg/dL Final   Total Protein 04/13/2022 6.8  6.5 - 8.1 g/dL Final   Albumin 04/13/2022 4.1  3.5 - 5.0 g/dL Final   AST 04/13/2022 17  15 - 41 U/L Final   ALT 04/13/2022 15  0 - 44 U/L Final   Alkaline Phosphatase 04/13/2022 50  38 - 126 U/L Final   Total Bilirubin 04/13/2022 0.2 (L)  0.3 - 1.2 mg/dL Final   GFR, Estimated 04/13/2022 >60  >60 mL/min Final   Comment: (NOTE) Calculated using the CKD-EPI Creatinine Equation (2021)    Anion gap 04/13/2022 12  5 - 15 Final   Performed at [place] [address] [place], [place] [address]   Cholesterol 04/13/2022 176  0 - 200 mg/dL Final   Triglycerides 04/13/2022 86  <150 mg/dL Final   HDL 04/13/2022 45  >40 mg/dL Final   Total CHOL/HDL Ratio 04/13/2022 3.9  RATIO Final   VLDL 04/13/2022 17  0 - 40 mg/dL Final   LDL Cholesterol 04/13/2022 114 (H)  0 - 99 mg/dL Final   Comment:        Total Cholesterol/HDL:CHD Risk Coronary Heart Disease Risk Table                     Men   Women  1/2 Average Risk   3.4   3.3  Average Risk        5.0   4.4  2 X Average Risk   9.6   7.1  3 X Average Risk  23.4   11.0        Use the calculated Patient Ratio above and the CHD Risk Table to determine the patient's CHD Risk.        ATP III CLASSIFICATION (LDL):  <100     mg/dL   Optimal  100-129  mg/dL   Near or Above                    Optimal  130-159  mg/dL   Borderline  160-189  mg/dL   High  >190     mg/dL   Very High Performed at [place] [address], [place], [place] [address]    TSH 04/13/2022 0.441  0.350 - 4.500 uIU/mL Final   Comment: Performed by a 3rd Generation assay with a functional sensitivity of <=0.01 uIU/mL. Performed at [facility], [place] [address] [place], [place] [address]    Preg Test, Ur 04/13/2022 Negative  Negative Preliminary   POC Amphetamine UR 04/13/2022 None Detected  NONE DETECTED (Cut Off Level 1000 ng/mL) Preliminary   POC Secobarbital (BAR) 04/13/2022 None Detected  NONE DETECTED (Cut Off Level 300 ng/mL) Preliminary   POC Buprenorphine (BUP) 04/13/2022 None Detected  NONE DETECTED (Cut Off Level 10 ng/mL) Preliminary   POC Oxazepam (BZO) 04/13/2022 None Detected  NONE DETECTED (Cut Off Level 300 ng/mL) Preliminary   POC Cocaine UR 04/13/2022 None Detected  NONE DETECTED (Cut Off Level 300 ng/mL) Preliminary   POC Methamphetamine UR 04/13/2022 None Detected  NONE DETECTED (Cut Off Level 1000 ng/mL) Preliminary   POC Morphine 04/13/2022 None Detected  NONE DETECTED (Cut Off Level 300 ng/mL) Preliminary   POC Methadone UR 04/13/2022 None Detected  NONE DETECTED (Cut Off Level 300 ng/mL) Preliminary   POC Oxycodone UR 04/13/2022 None Detected  NONE DETECTED (Cut Off Level 100 ng/mL) Preliminary   POC Marijuana UR 04/13/2022 Positive (A)  NONE DETECTED (Cut Off Level 50 ng/mL) Preliminary   SARSCOV2ONAVIRUS 2 AG 04/13/2022 NEGATIVE  NEGATIVE Final   Comment: (NOTE) SARS-CoV-2 antigen NOT DETECTED.   Negative results are presumptive.  Negative results do not  preclude SARS-CoV-2 infection and should not be used as the sole basis for treatment or other patient management decisions, including infection  control decisions, particularly in the presence of clinical signs and  symptoms consistent with COVID-19, or in those who have been in contact with the virus.  Negative results must be combined with clinical observations, patient history, and epidemiological information. The expected result is Negative.  Fact Sheet for Patients: HandmadeRecipes.com.cy  Fact Sheet for Healthcare Providers: FuneralLife.at  This test is not yet approved or cleared by the [place] FDA and  has been authorized for detection and/or diagnosis of SARS-CoV-2 by FDA under an Emergency Use Authorization (EUA).  This EUA will remain in effect (meaning this test can be used) for the duration of  the COV                          ID-19 declaration under Section 564(b)(1) of the Act, 21 U.S.C. section 360bbb-3(b)(1), unless the authorization is terminated or revoked sooner.     Preg Test, Ur 04/13/2022 NEGATIVE  NEGATIVE Final   Comment:        THE SENSITIVITY OF THIS METHODOLOGY IS >24 mIU/mL     Allergies: Lactose intolerance (gi) and Tape  Medications:  Facility Ordered Medications  Medication   acetaminophen (TYLENOL) tablet 650 mg  alum & mag hydroxide-simeth (MAALOX/MYLANTA) 200-200-20 MG/5ML suspension 30 mL   magnesium hydroxide (MILK OF MAGNESIA) suspension 30 mL   OLANZapine zydis (ZYPREXA) disintegrating tablet 5 mg   And   LORazepam (ATIVAN) tablet 1 mg   And   ziprasidone (GEODON) injection 20 mg   PTA Medications  Medication Sig   cetirizine (ZYRTEC) 10 MG tablet Take 1 tablet (10 mg total) by mouth daily.   albuterol (VENTOLIN HFA) 108 (90 Base) MCG/ACT inhaler Inhale 1 puff into the lungs every 6 (six) hours as needed for wheezing or shortness of breath.   oseltamivir (TAMIFLU) 75 MG capsule  Take 1 capsule (75 mg total) by mouth every 12 (twelve) hours.   lamoTRIgine (LAMICTAL) 25 MG tablet Take 1 tablet (25 mg total) by mouth daily. For mood stabilization   lamoTRIgine (LAMICTAL) 25 MG tablet Take 2 tablets (50 mg total) by mouth daily.   ARIPiprazole (ABILIFY) 5 MG tablet Take 1 tablet (5 mg total) by mouth daily for 6 days, THEN 2 tablets (10 mg total) daily. For mood control.   atomoxetine (STRATTERA) 40 MG capsule Take 1 capsule (40 mg total) by mouth daily.   trazodone (DESYREL) 300 MG tablet Take 1 tablet (300 mg total) by mouth at bedtime. For sleep. Patient to take half-tablet as needed.   busPIRone (BUSPAR) 10 MG tablet Take 1 tablet (10 mg total) by mouth 2 (two) times daily. For anxiety   escitalopram (LEXAPRO) 10 MG tablet Take 1 tablet (10 mg total) by mouth daily. For depression   nicotine (NICODERM CQ - DOSED IN MG/24 HOURS) 21 mg/24hr patch Place 1 patch (21 mg total) onto the skin daily. (May buy from over the counter): For smoking cessation   prazosin (MINIPRESS) 1 MG capsule TAKE 1 CAPSULE(1 MG) BY MOUTH AT BEDTIME FOR NIGHTMARES    Medical Decision Making  Inpatient observation, need to go to inpatient admission when a bed becomes available  Meds ordered this encounter  Medications   acetaminophen (TYLENOL) tablet 650 mg   alum & mag hydroxide-simeth (MAALOX/MYLANTA) 200-200-20 MG/5ML suspension 30 mL   magnesium hydroxide (MILK OF MAGNESIA) suspension 30 mL   AND Linked Order Group    OLANZapine zydis (ZYPREXA) disintegrating tablet 5 mg    LORazepam (ATIVAN) tablet 1 mg    ziprasidone (GEODON) injection 20 mg    Lab Orders         Resp panel by RT-PCR (RSV, Flu A&B, Covid) Anterior Nasal Swab         CBC with Differential/Platelet         Comprehensive metabolic panel         Hemoglobin A1c         Lipid panel         TSH         POC urine preg, ED         POCT Urine Drug Screen - (I-Screen)         POC SARS Coronavirus 2 Ag         Pregnancy,  urine POC        Recommendations  Based on my evaluation the patient appears to have an emergency medical condition for which I recommend the patient be transferred to the emergency department for further evaluation.  Evette Georges, NP 04/14/22  5:35 AM

## 2022-04-14 ENCOUNTER — Encounter (HOSPITAL_COMMUNITY): Payer: Self-pay | Admitting: Emergency Medicine

## 2022-04-14 LAB — LIPID PANEL
Cholesterol: 176 mg/dL (ref 0–200)
HDL: 45 mg/dL (ref >40)
LDL Cholesterol: 114 mg/dL — ABNORMAL HIGH (ref 0–99)
Total CHOL/HDL Ratio: 3.9 RATIO
Triglycerides: 86 mg/dL (ref <150)
VLDL: 17 mg/dL (ref 0–40)

## 2022-04-14 LAB — COMPREHENSIVE METABOLIC PANEL
ALT: 15 U/L (ref 0–44)
AST: 17 U/L (ref 15–41)
Albumin: 4.1 g/dL (ref 3.5–5.0)
Alkaline Phosphatase: 50 U/L (ref 38–126)
Anion gap: 12 (ref 5–15)
BUN: 12 mg/dL (ref 6–20)
CO2: 27 mmol/L (ref 22–32)
Calcium: 9.1 mg/dL (ref 8.9–10.3)
Chloride: 103 mmol/L (ref 98–111)
Creatinine, Ser: 0.73 mg/dL (ref 0.44–1.00)
GFR, Estimated: >60 mL/min (ref >60)
Glucose, Bld: 79 mg/dL (ref 70–99)
Potassium: 3.9 mmol/L (ref 3.5–5.1)
Sodium: 142 mmol/L (ref 135–145)
Total Bilirubin: 0.2 mg/dL — ABNORMAL LOW (ref 0.3–1.2)
Total Protein: 6.8 g/dL (ref 6.5–8.1)

## 2022-04-14 LAB — TSH: TSH: 0.441 u[IU]/mL (ref 0.350–4.500)

## 2022-04-14 LAB — RESP PANEL BY RT-PCR (RSV, FLU A&B, COVID)  RVPGX2
Influenza A by PCR: NEGATIVE
Influenza B by PCR: NEGATIVE
Resp Syncytial Virus by PCR: NEGATIVE
SARS Coronavirus 2 by RT PCR: NEGATIVE

## 2022-04-14 MED ORDER — HYDROXYZINE HCL 25 MG PO TABS
ORAL_TABLET | ORAL | Status: AC
  Filled 2022-04-14: qty 1

## 2022-04-14 MED ORDER — HYDROXYZINE HCL 25 MG PO TABS
25.0000 mg | ORAL_TABLET | Freq: Three times a day (TID) | ORAL | Status: DC | PRN
  Administered 2022-04-14 – 2022-04-15 (×2): 25 mg via ORAL
  Filled 2022-04-14: qty 1

## 2022-04-14 MED ORDER — MELATONIN 3 MG PO TABS: 3.0000 mg | ORAL_TABLET | Freq: Every evening | ORAL | Status: DC | PRN

## 2022-04-14 MED ORDER — TRAZODONE HCL 50 MG PO TABS
50.0000 mg | ORAL_TABLET | Freq: Every evening | ORAL | Status: DC | PRN
  Administered 2022-04-14: 50 mg via ORAL
  Filled 2022-04-14: qty 1

## 2022-04-14 NOTE — ED Notes (Signed)
Patient is requesting medication to help her sleep. The provider was advised, and an order for melatonin was placed. When this nurse advised the patient, her response was that does not work for her. A message has been sent to the provider.

## 2022-04-14 NOTE — ED Notes (Signed)
Patient resting quietly in bed with eyes closed, Respirations equal and unlabored, skin warm and dry.  Routine safety checks conducted according to facility protocol. Will continue to monitor for safety.

## 2022-04-14 NOTE — ED Notes (Signed)
Patient observed/assessed in bed/chair resting quietly appearing in no distress and verbalizing no complaints at this time. Will continue to monitor.  

## 2022-04-14 NOTE — ED Provider Notes (Cosign Needed Addendum)
Patient requested to meet with provider. [name] is reassessed briefly, face-to-face, by this nurse practitioner.  She is seated in observation area, no apparent distress.  She is alert and oriented, pleasant and cooperative.  Patient states "I wonder if I would be able to just follow-up with outpatient?"  Patient denies suicidal and homicidal ideations currently.  She endorses suicidal ideation earlier this date and "heard a voice that I should harm myself this morning, it sounds like my voice but it is not me."  Denies auditory and visual hallucinations currently.  [name] verbalizes agreement with treatment plan to include inpatient psychiatric hospitalization.  She has been accepted to [facility] behavioral health for arrival on 04/15/2022.  Patient states "can I  take something for my anxiety?"  Reviewed hydroxyzine, discussed potential side effects and offered patient opportunity to ask questions.  Medication: -hydroxyzine 25mg  TID PRN/anxiety  Patient offered support and encouragement.  Patient reviewed with Dr. Hampton Abbot.

## 2022-04-14 NOTE — ED Notes (Signed)
Pt sleeping@this time. Breathing even and unlabored. Will continue to monitor for safety 

## 2022-04-14 NOTE — ED Notes (Signed)
Pt presents with depressed mood, blunted affect. Pt presents very withdrawn, almost child like. She is hesitant to ask for items and speaks in almost inaudible tone. Pt denies any SI currently and denies acute concerns. Given breakfast . Pt is safe, will con't to monitor.

## 2022-04-14 NOTE — Progress Notes (Addendum)
Per Day [facility] Lynnda Shields, RN advises that pt will transfer to [facility] 04/15/22 PENDING discharges. Day Shift [facility] Brooke McNichols, RN to follow up with care team. -Nursing has faxed sign vol consent to [place] at [phone].  Care Team notified: Day [facility] Lynnda Shields, RN, Marijean Niemann, LPN, Dayna Ramus, RN, Rosezetta Schlatter, MD, [place], [place], Night [facility] Wynonia Hazard, RN   Benjaman Kindler, MSW, [facility] 04/14/2022 7:51 PM

## 2022-04-14 NOTE — Progress Notes (Signed)
[facility]  Behavioral Health Progress Note  Date and Time: 04/14/2022 4:28 PM Name: [name] Age: 22 y.o.  DOB: 07-Mar-2000  MRN:  [id number]  Diagnosis:  Final diagnoses:  Suicidal ideation  Marijuana abuse  Anxious appearance    Brief HPI  [name] is a 22 y.o. female, with [place] suicidal ideation, depression, substance abuse c/b mood disorder who presented Voluntary to [facility] (04/14/2022) for suicidal ideation with plans to overdose on medication.   has a past medical history of Anxiety, Asthma, Headache(784.0), suicide attempt, Major depressive disorder, Morbid obesity ([place]) (03/25/2020), and PTSD (post-traumatic stress disorder).    On AM reassessment, patient appears dysphoric and reports feeling "the same." She continues to endorse SI. She slept well, and appetite is beginning to improve. She continues to endorse AH with voices saying negative things about her and telling her to harm herself and others. She refuses to go into further detail about specifics of what the voices are saying. Patient feels sad about the loss of her grandmother but reports support system in her parents and grandfather. We discuss inpatient hospitalization, and she is in agreement with the need for hospital stay.   Full (Time, Place and Person) WDL   ROS   SI:Yes, Active With Intent; With Plan With Plan; With Intent  HI:No Without Intent; Without Plan  [id number] that i shouldnt be here Spirits, demons, and ttear things down   Mood: Anxious; Depressed  Sleep:Poor ,   hours Appetite: Poor ROS    Past History   Past Psychiatric History: See H&P Past Medical History:  Past Medical History:  Diagnosis Date   Anxiety    Asthma    Headache(784.0)    Hx of suicide attempt    Major depressive disorder    Morbid obesity ([place]) 03/25/2020   PTSD (post-traumatic stress disorder)     Past Surgical History:  Procedure Laterality Date   wisdom tooth  extraction     Family History:  Family History  Problem Relation Age of Onset   Diabetes Maternal Aunt    Diabetes Maternal Grandmother    Cancer Maternal Grandmother    Breast cancer Maternal Grandmother    Breast cancer Other    Family Psychiatric  History: See H&P  Social History:  Social History   Substance and Sexual Activity  Alcohol Use Yes   Comment: been drinking heavily last 2 weeks; a 1/5 of liquor a day     Social History   Substance and Sexual Activity  Drug Use Not Currently   Types: Marijuana, MDMA (Ecstacy), Cocaine   Comment: Coc-last use 2/27; marijuana and MDMA last use 2/28    Social History   Socioeconomic History   Marital status: Single    Spouse name: Not on file   Number of children: 2   Years of education: Not on file   Highest education level: Some college, no degree  Occupational History   Not on file  Tobacco Use   Smoking status: Every Day    Packs/day: 0.50    Years: 4.00    Additional pack years: 0.00    Total pack years: 2.00    Types: Cigarettes, Cigars   Smokeless tobacco: Never   Tobacco comments:    black and mild with THC  Substance and Sexual Activity   Alcohol use: Yes    Comment: been drinking heavily last 2 weeks; a 1/5 of liquor a day   Drug use: Not Currently  Types: Marijuana, MDMA (Ecstacy), Cocaine    Comment: Coc-last use 2/27; marijuana and MDMA last use 2/28   Sexual activity: Yes    Partners: Male    Birth control/protection: None  Other Topics Concern   Not on file  Social History Narrative   Pt and family are homeless. She, her wife and two kids live in her cousin's house   Social Determinants of Health   Financial Resource Strain: Not on file  Food Insecurity: Not on file  Transportation Needs: Not on file  Physical Activity: Not on file  Stress: Not on file  Social Connections: Not on file   SDOH:  SDOH Screenings   Alcohol Screen: Low Risk  (09/16/2020)  Depression (PHQ2-9): High Risk  (08/04/2021)  Tobacco Use: High Risk (08/04/2021)   Additional Social History:    Pain Medications: see MAR Prescriptions: see MAR Over the Counter: see MAR History of alcohol / drug use?: Yes Longest period of sobriety (when/how long): unknown Negative Consequences of Use: Financial, Personal relationships, Work / Youth worker Withdrawal Symptoms: Patient aware of relationship between substance abuse and physical/medical complications Name of Substance 1: cannabis 1 - Age of First Use: 11 1 - Amount (size/oz): varies 1 - Frequency: 4 of 7 days of the week 1 - Duration: ongoing 1 - Last Use / Amount: yesterday 1 - Method of Aquiring: purchase 1- Route of Use: smoke                  Current Medications:  Current Facility-Administered Medications  Medication Dose Route Frequency Provider Last Rate Last Admin   acetaminophen (TYLENOL) tablet 650 mg  650 mg Oral Q6H PRN Evette Georges, NP       alum & mag hydroxide-simeth (MAALOX/MYLANTA) 200-200-20 MG/5ML suspension 30 mL  30 mL Oral Q4H PRN Evette Georges, NP       OLANZapine zydis (ZYPREXA) disintegrating tablet 5 mg  5 mg Oral Q8H PRN Evette Georges, NP       And   LORazepam (ATIVAN) tablet 1 mg  1 mg Oral PRN Evette Georges, NP       And   ziprasidone (GEODON) injection 20 mg  20 mg Intramuscular PRN Evette Georges, NP       magnesium hydroxide (MILK OF MAGNESIA) suspension 30 mL  30 mL Oral Daily PRN Evette Georges, NP       Current Outpatient Medications  Medication Sig Dispense Refill   albuterol (VENTOLIN HFA) 108 (90 Base) MCG/ACT inhaler Inhale 1 puff into the lungs every 6 (six) hours as needed for wheezing or shortness of breath. (Patient not taking: Reported on 04/14/2022) 18 g 2   ARIPiprazole (ABILIFY) 5 MG tablet Take 1 tablet (5 mg total) by mouth daily for 6 days, THEN 2 tablets (10 mg total) daily. For mood control. (Patient not taking: Reported on 04/14/2022) 57 tablet 1   busPIRone (BUSPAR) 10 MG tablet Take 1 tablet  (10 mg total) by mouth 2 (two) times daily. For anxiety (Patient not taking: Reported on 04/14/2022) 60 tablet 1   escitalopram (LEXAPRO) 10 MG tablet Take 1 tablet (10 mg total) by mouth daily. For depression (Patient not taking: Reported on 04/14/2022) 30 tablet 1   lamoTRIgine (LAMICTAL) 25 MG tablet Take 1 tablet (25 mg total) by mouth daily. For mood stabilization (Patient not taking: Reported on 04/14/2022) 14 tablet 0   prazosin (MINIPRESS) 1 MG capsule TAKE 1 CAPSULE(1 MG) BY MOUTH AT BEDTIME FOR NIGHTMARES (Patient not taking: Reported on  04/14/2022) 90 capsule 1   Labs  Lab Results:  Admission on 04/13/2022  Component Date Value Ref Range Status   SARS Coronavirus 2 by RT PCR 04/13/2022 NEGATIVE  NEGATIVE Final   Influenza A by PCR 04/13/2022 NEGATIVE  NEGATIVE Final   Influenza B by PCR 04/13/2022 NEGATIVE  NEGATIVE Final   Comment: (NOTE) The Xpert Xpress SARS-CoV-2/FLU/RSV plus assay is intended as an aid in the diagnosis of influenza from Nasopharyngeal swab specimens and should not be used as a sole basis for treatment. Nasal washings and aspirates are unacceptable for Xpert Xpress SARS-CoV-2/FLU/RSV testing.  Fact Sheet for Patients: EntrepreneurPulse.com.au  Fact Sheet for Healthcare Providers: IncredibleEmployment.be  This test is not yet approved or cleared by the [place] FDA and has been authorized for detection and/or diagnosis of SARS-CoV-2 by FDA under an Emergency Use Authorization (EUA). This EUA will remain in effect (meaning this test can be used) for the duration of the COVID-19 declaration under Section 564(b)(1) of the Act, 21 U.S.C. section 360bbb-3(b)(1), unless the authorization is terminated or revoked.     Resp Syncytial Virus by PCR 04/13/2022 NEGATIVE  NEGATIVE Final   Comment: (NOTE) Fact Sheet for Patients: EntrepreneurPulse.com.au  Fact Sheet for Healthcare  Providers: IncredibleEmployment.be  This test is not yet approved or cleared by the [place] FDA and has been authorized for detection and/or diagnosis of SARS-CoV-2 by FDA under an Emergency Use Authorization (EUA). This EUA will remain in effect (meaning this test can be used) for the duration of the COVID-19 declaration under Section 564(b)(1) of the Act, 21 U.S.C. section 360bbb-3(b)(1), unless the authorization is terminated or revoked.  Performed at [facility], [place] [address] [place], [place] [address]    WBC 04/13/2022 9.0  4.0 - 10.5 K/uL Final   RBC 04/13/2022 4.37  3.87 - 5.11 MIL/uL Final   Hemoglobin 04/13/2022 14.2  12.0 - 15.0 g/dL Final   HCT 04/13/2022 41.3  36.0 - 46.0 % Final   MCV 04/13/2022 94.5  80.0 - 100.0 fL Final   MCH 04/13/2022 32.5  26.0 - 34.0 pg Final   MCHC 04/13/2022 34.4  30.0 - 36.0 g/dL Final   RDW 04/13/2022 12.7  11.5 - 15.5 % Final   Platelets 04/13/2022 193  150 - 400 K/uL Final   nRBC 04/13/2022 0.0  0.0 - 0.2 % Final   Neutrophils Relative % 04/13/2022 67  % Final   Neutro Abs 04/13/2022 6.0  1.7 - 7.7 K/uL Final   Lymphocytes Relative 04/13/2022 24  % Final   Lymphs Abs 04/13/2022 2.1  0.7 - 4.0 K/uL Final   Monocytes Relative 04/13/2022 8  % Final   Monocytes Absolute 04/13/2022 0.7  0.1 - 1.0 K/uL Final   Eosinophils Relative 04/13/2022 1  % Final   Eosinophils Absolute 04/13/2022 0.1  0.0 - 0.5 K/uL Final   Basophils Relative 04/13/2022 0  % Final   Basophils Absolute 04/13/2022 0.0  0.0 - 0.1 K/uL Final   Immature Granulocytes 04/13/2022 0  % Final   Abs Immature Granulocytes 04/13/2022 0.02  0.00 - 0.07 K/uL Final   Performed at [facility], [place] [address], [place], [place] [address]   Sodium 04/13/2022 142  135 - 145 mmol/L Final   Potassium 04/13/2022 3.9  3.5 - 5.1 mmol/L Final   Chloride 04/13/2022 103  98 - 111 mmol/L Final   CO2 04/13/2022 27  22 - 32 mmol/L Final   Glucose, Bld  04/13/2022  79  70 - 99 mg/dL Final   Glucose reference range applies only to samples taken after fasting for at least 8 hours.   BUN 04/13/2022 12  6 - 20 mg/dL Final   Creatinine, Ser 04/13/2022 0.73  0.44 - 1.00 mg/dL Final   Calcium 04/13/2022 9.1  8.9 - 10.3 mg/dL Final   Total Protein 04/13/2022 6.8  6.5 - 8.1 g/dL Final   Albumin 04/13/2022 4.1  3.5 - 5.0 g/dL Final   AST 04/13/2022 17  15 - 41 U/L Final   ALT 04/13/2022 15  0 - 44 U/L Final   Alkaline Phosphatase 04/13/2022 50  38 - 126 U/L Final   Total Bilirubin 04/13/2022 0.2 (L)  0.3 - 1.2 mg/dL Final   GFR, Estimated 04/13/2022 >60  >60 mL/min Final   Comment: (NOTE) Calculated using the CKD-EPI Creatinine Equation (2021)    Anion gap 04/13/2022 12  5 - 15 Final   Performed at [facility] [address] [place], [place] [address]   Cholesterol 04/13/2022 176  0 - 200 mg/dL Final   Triglycerides 04/13/2022 86  <150 mg/dL Final   HDL 04/13/2022 45  >40 mg/dL Final   Total CHOL/HDL Ratio 04/13/2022 3.9  RATIO Final   VLDL 04/13/2022 17  0 - 40 mg/dL Final   LDL Cholesterol 04/13/2022 114 (H)  0 - 99 mg/dL Final   Comment:        Total Cholesterol/HDL:CHD Risk Coronary Heart Disease Risk Table                     Men   Women  1/2 Average Risk   3.4   3.3  Average Risk       5.0   4.4  2 X Average Risk   9.6   7.1  3 X Average Risk  23.4   11.0        Use the calculated Patient Ratio above and the CHD Risk Table to determine the patient's CHD Risk.        ATP III CLASSIFICATION (LDL):  <100     mg/dL   Optimal  100-129  mg/dL   Near or Above                    Optimal  130-159  mg/dL   Borderline  160-189  mg/dL   High  >190     mg/dL   Very High Performed at [facility] [address], [place], [place] [address]    TSH 04/13/2022 0.441  0.350 - 4.500 uIU/mL Final   Comment: Performed by a 3rd Generation assay with a functional sensitivity of <=0.01 uIU/mL. Performed at [facility],  [place] [address] [place], [place] [address]    Preg Test, Ur 04/13/2022 Negative  Negative Preliminary   POC Amphetamine UR 04/13/2022 None Detected  NONE DETECTED (Cut Off Level 1000 ng/mL) Preliminary   POC Secobarbital (BAR) 04/13/2022 None Detected  NONE DETECTED (Cut Off Level 300 ng/mL) Preliminary   POC Buprenorphine (BUP) 04/13/2022 None Detected  NONE DETECTED (Cut Off Level 10 ng/mL) Preliminary   POC Oxazepam (BZO) 04/13/2022 None Detected  NONE DETECTED (Cut Off Level 300 ng/mL) Preliminary   POC Cocaine UR 04/13/2022 None Detected  NONE DETECTED (Cut Off Level 300 ng/mL) Preliminary   POC Methamphetamine UR 04/13/2022 None Detected  NONE DETECTED (Cut Off Level 1000 ng/mL) Preliminary   POC Morphine 04/13/2022 None Detected  NONE DETECTED (Cut Off Level 300 ng/mL) Preliminary   POC Methadone UR 04/13/2022 None Detected  NONE DETECTED (Cut Off Level 300 ng/mL) Preliminary   POC Oxycodone UR 04/13/2022 None Detected  NONE DETECTED (Cut Off Level 100 ng/mL) Preliminary   POC Marijuana UR 04/13/2022 Positive (A)  NONE DETECTED (Cut Off Level 50 ng/mL) Preliminary   SARSCOV2ONAVIRUS 2 AG 04/13/2022 NEGATIVE  NEGATIVE Final   Comment: (NOTE) SARS-CoV-2 antigen NOT DETECTED.   Negative results are presumptive.  Negative results do not preclude SARS-CoV-2 infection and should not be used as the sole basis for treatment or other patient management decisions, including infection  control decisions, particularly in the presence of clinical signs and  symptoms consistent with COVID-19, or in those who have been in contact with the virus.  Negative results must be combined with clinical observations, patient history, and epidemiological information. The expected result is Negative.  Fact Sheet for Patients: HandmadeRecipes.com.cy  Fact Sheet for Healthcare Providers: FuneralLife.at  This test is not yet approved or cleared by the [place]  FDA and  has been authorized for detection and/or diagnosis of SARS-CoV-2 by FDA under an Emergency Use Authorization (EUA).  This EUA will remain in effect (meaning this test can be used) for the duration of  the COV                          ID-19 declaration under Section 564(b)(1) of the Act, 21 U.S.C. section 360bbb-3(b)(1), unless the authorization is terminated or revoked sooner.     Preg Test, Ur 04/13/2022 NEGATIVE  NEGATIVE Final   Comment:        THE SENSITIVITY OF THIS METHODOLOGY IS >24 mIU/mL    Blood Alcohol level:  Lab Results  Component Value Date   ETH <10 06/03/2021   ETH <10 [id number]   Metabolic Disorder Labs: Lab Results  Component Value Date   HGBA1C 5.4 09/16/2020   MPG 108.28 09/16/2020   MPG 108.28 03/25/2020   Lab Results  Component Value Date   PROLACTIN 6.6 12/28/2018   PROLACTIN 5.8 08/23/2017   Lab Results  Component Value Date   CHOL 176 04/13/2022   TRIG 86 04/13/2022   HDL 45 04/13/2022   CHOLHDL 3.9 04/13/2022   VLDL 17 04/13/2022   LDLCALC 114 (H) 04/13/2022   LDLCALC 112 (H) 09/17/2020   Therapeutic Lab Levels: No results found for: "LITHIUM" No results found for: "VALPROATE" No results found for: "CBMZ" Physical Findings   AIMS    Flowsheet Row Admission (Discharged) from OP Visit from 12/27/2018 in [facility] Admission (Discharged) from OP Visit from 03/08/2018 in [place] [facility] Admission (Discharged) from OP Visit from 10/10/2017 in [facility] [place] [facility] Admission (Discharged) from 12/10/2014 in [facility] CHILD/ADOLES 600B  AIMS Total Score 0 0 0 0      AUDIT    Flowsheet Row Admission (Discharged) from 09/16/2020 in [facility] 300[facility] Admission (Discharged) from OP Visit from 03/24/2020 in [facility] 300[facility] Admission (Discharged) from OP Visit from 12/27/2018 in  [place] 300B Admission (Discharged) from OP Visit from 03/08/2018 in [place] 300B Admission (Discharged) from OP Visit from 10/10/2017 in [place] 400B  Alcohol Use Disorder Identification Test Final Score (AUDIT) 0 15 2 3  0      GAD-7  Flowsheet Row Clinical Support from 05/27/2021 in [facility] Clinical Support from 04/10/2021 in [facility] Clinical Support from 08/20/2020 in [facility] Clinical Support from 07/08/2020 in [facility] Video Visit from 03/21/2020 in [facility]  Total GAD-7 Score 21 21 20 21 19       PHQ2-9    [place] ED from 08/04/2021 in [facility] Clinical Support from 05/27/2021 in [facility] Clinical Support from 04/10/2021 in [facility] ED from 09/16/2020 in [place] from 08/20/2020 in [place]  PHQ-2 Total Score 4 6 5 3 2   PHQ-9 Total Score 18 27 25 11 13       [place] ED from 04/13/2022 in [facility] ED from 08/04/2021 in [facility] ED from 06/03/2021 in [facility] Emergency Department at [place] High Risk High Risk No Risk      Musculoskeletal  Strength & Muscle Tone: within normal limits Gait & Station: normal Patient leans: N/A  Strength & Muscle Tone: within normal limits Gait & Station: normal Patient leans: N/A  Psychiatric Specialty Exam   Presentation  General Appearance: Casual  Eye Contact: Fair  Speech: Clear and Coherent  Volume: Normal  Handedness:Right   Mood and Affect  Mood: Anxious; Depressed  Affect: Appropriate   Thought  Process  Thought Process: Coherent  Descriptions of Associations: Circumstantial   Thought Content Suicidal Thoughts:Yes, Active With Intent; With Plan With Plan; With Intent  Homicidal Thoughts:No Without Intent; Without Plan  Hallucinations:Auditory that i shouldnt be here Spirits, demons, and ttear things down  Ideas of Reference:None  Thought Content:WDL   Sensorium  Memory:Immediate Fair  Judgment:Poor  Insight:Shallow   Executive Functions  Orientation:Full (Time, Place and Person)  Language:Good  Concentration:Good  [place] of Knowledge:Good   Psychomotor Activity  Psychomotor Activity:Normal   Assets  Assets:Desire for Improvement; Resilience   Sleep  Quality:Poor  Duration:   hours  Physical Exam   BP 94/67 (BP Location: Right Arm)   Pulse 64   Temp 98.2 F (36.8 C) (Oral)   Resp 16   SpO2 100%  Physical Exam Vitals reviewed.  Constitutional:      General: She is not in acute distress. HENT:     Head: Normocephalic and atraumatic.  Pulmonary:     Effort: Pulmonary effort is normal.  Neurological:     General: No focal deficit present.     Mental Status: She is alert and oriented to person, place, and time.     Motor: No weakness.     Gait: Gait normal.     Assessment & Plan   Total Time spent with patient: 20 minutes Treatment Plan Summary: Daily contact with patient to assess and evaluate symptoms and progress in treatment and Medication management  [name] is a 22 y.o. female who continues to endorse active SI and AH. She is in need of inpatient psychiatric hospitalization. She has been accepted to [facility] for tomorrow, 3/21   DISPO: Tentative date: 3/21 Location: [place]  Signed: Rosezetta Schlatter, MD Psychiatry Resident, PGY-2 [facility] 04/14/2022, 4:28 PM

## 2022-04-14 NOTE — Progress Notes (Signed)
Pt has been calm, cooperative and appears in no acute distress. Resting quietly with even unlabored respirations.

## 2022-04-15 ENCOUNTER — Other Ambulatory Visit: Payer: Self-pay

## 2022-04-15 ENCOUNTER — Inpatient Hospital Stay (HOSPITAL_COMMUNITY)
Admission: AD | Admit: 2022-04-15 | Discharge: 2022-04-20 | DRG: 885 | Disposition: A | Discharge status: Home / Self Care | Payer: Medicaid Other | Source: Intra-hospital | Attending: Psychiatry | Admitting: Psychiatry

## 2022-04-15 ENCOUNTER — Encounter (HOSPITAL_COMMUNITY): Payer: Self-pay | Admitting: Student

## 2022-04-15 DIAGNOSIS — F1290 Cannabis use, unspecified, uncomplicated: Secondary | ICD-10-CM | POA: Diagnosis present

## 2022-04-15 DIAGNOSIS — F4310 Post-traumatic stress disorder, unspecified: Secondary | ICD-10-CM | POA: Diagnosis present

## 2022-04-15 DIAGNOSIS — Z9151 Personal history of suicidal behavior: Secondary | ICD-10-CM

## 2022-04-15 DIAGNOSIS — F411 Generalized anxiety disorder: Secondary | ICD-10-CM | POA: Diagnosis present

## 2022-04-15 DIAGNOSIS — I10 Essential (primary) hypertension: Secondary | ICD-10-CM | POA: Diagnosis present

## 2022-04-15 DIAGNOSIS — F333 Major depressive disorder, recurrent, severe with psychotic symptoms: Secondary | ICD-10-CM | POA: Diagnosis present

## 2022-04-15 DIAGNOSIS — R440 Auditory hallucinations: Secondary | ICD-10-CM | POA: Diagnosis present

## 2022-04-15 DIAGNOSIS — Z560 Unemployment, unspecified: Secondary | ICD-10-CM | POA: Diagnosis not present

## 2022-04-15 DIAGNOSIS — Z79899 Other long term (current) drug therapy: Secondary | ICD-10-CM

## 2022-04-15 DIAGNOSIS — N898 Other specified noninflammatory disorders of vagina: Secondary | ICD-10-CM | POA: Diagnosis present

## 2022-04-15 DIAGNOSIS — F17210 Nicotine dependence, cigarettes, uncomplicated: Secondary | ICD-10-CM | POA: Diagnosis present

## 2022-04-15 DIAGNOSIS — Z803 Family history of malignant neoplasm of breast: Secondary | ICD-10-CM | POA: Diagnosis not present

## 2022-04-15 DIAGNOSIS — R45851 Suicidal ideations: Secondary | ICD-10-CM | POA: Diagnosis present

## 2022-04-15 DIAGNOSIS — F3130 Bipolar disorder, current episode depressed, mild or moderate severity, unspecified: Principal | ICD-10-CM | POA: Diagnosis present

## 2022-04-15 DIAGNOSIS — F315 Bipolar disorder, current episode depressed, severe, with psychotic features: Secondary | ICD-10-CM | POA: Diagnosis not present

## 2022-04-15 DIAGNOSIS — J45909 Unspecified asthma, uncomplicated: Secondary | ICD-10-CM | POA: Diagnosis present

## 2022-04-15 DIAGNOSIS — Z5901 Sheltered homelessness: Secondary | ICD-10-CM | POA: Diagnosis not present

## 2022-04-15 DIAGNOSIS — F603 Borderline personality disorder: Secondary | ICD-10-CM | POA: Diagnosis present

## 2022-04-15 DIAGNOSIS — Z833 Family history of diabetes mellitus: Secondary | ICD-10-CM | POA: Diagnosis not present

## 2022-04-15 DIAGNOSIS — F314 Bipolar disorder, current episode depressed, severe, without psychotic features: Secondary | ICD-10-CM | POA: Diagnosis present

## 2022-04-15 DIAGNOSIS — F319 Bipolar disorder, unspecified: Secondary | ICD-10-CM | POA: Diagnosis present

## 2022-04-15 LAB — HEMOGLOBIN A1C
Hgb A1c MFr Bld: 5.4 % (ref 4.8–5.6)
Mean Plasma Glucose: 108 mg/dL

## 2022-04-15 MED ORDER — DIPHENHYDRAMINE HCL 25 MG PO CAPS: 50.0000 mg | ORAL_CAPSULE | Freq: Three times a day (TID) | ORAL | Status: DC | PRN

## 2022-04-15 MED ORDER — ACETAMINOPHEN 325 MG PO TABS: 650.0000 mg | ORAL_TABLET | Freq: Four times a day (QID) | ORAL | Status: DC | PRN

## 2022-04-15 MED ORDER — HALOPERIDOL 5 MG PO TABS: 5.0000 mg | ORAL_TABLET | Freq: Three times a day (TID) | ORAL | Status: DC | PRN

## 2022-04-15 MED ORDER — ALUM & MAG HYDROXIDE-SIMETH 200-200-20 MG/5ML PO SUSP: 30.0000 mL | ORAL | Status: DC | PRN

## 2022-04-15 MED ORDER — LORAZEPAM 2 MG/ML IJ SOLN: 2.0000 mg | Freq: Three times a day (TID) | INTRAMUSCULAR | Status: DC | PRN

## 2022-04-15 MED ORDER — HYDROXYZINE HCL 25 MG PO TABS
25.0000 mg | ORAL_TABLET | Freq: Three times a day (TID) | ORAL | Status: DC | PRN
  Administered 2022-04-15 – 2022-04-19 (×2): 25 mg via ORAL
  Filled 2022-04-15 (×2): qty 1

## 2022-04-15 MED ORDER — HALOPERIDOL LACTATE 5 MG/ML IJ SOLN: 5.0000 mg | Freq: Three times a day (TID) | INTRAMUSCULAR | Status: DC | PRN

## 2022-04-15 MED ORDER — LORAZEPAM 1 MG PO TABS: 2.0000 mg | ORAL_TABLET | Freq: Three times a day (TID) | ORAL | Status: DC | PRN

## 2022-04-15 MED ORDER — HYDROXYZINE HCL 25 MG PO TABS: 25.0000 mg | ORAL_TABLET | Freq: Three times a day (TID) | ORAL | 0 refills | Status: DC | PRN

## 2022-04-15 MED ORDER — DIPHENHYDRAMINE HCL 50 MG/ML IJ SOLN: 50.0000 mg | Freq: Three times a day (TID) | INTRAMUSCULAR | Status: DC | PRN

## 2022-04-15 MED ORDER — MAGNESIUM HYDROXIDE 400 MG/5ML PO SUSP: 30.0000 mL | Freq: Every day | ORAL | Status: DC | PRN

## 2022-04-15 MED ORDER — TRAZODONE HCL 50 MG PO TABS
50.0000 mg | ORAL_TABLET | Freq: Every evening | ORAL | Status: DC | PRN
  Administered 2022-04-15 – 2022-04-19 (×5): 50 mg via ORAL
  Filled 2022-04-15 (×5): qty 1

## 2022-04-15 MED ORDER — TRAZODONE HCL 50 MG PO TABS: 50.0000 mg | ORAL_TABLET | Freq: Every evening | ORAL | Status: DC | PRN

## 2022-04-15 NOTE — Plan of Care (Signed)
  Problem: Education: Goal: Ability to make informed decisions regarding treatment will improve Outcome: Progressing   Problem: Coping: Goal: Coping ability will improve Outcome: Progressing   Problem: Health Behavior/Discharge Planning: Goal: Identification of resources available to assist in meeting health care needs will improve Outcome: Progressing   Problem: Self-Concept: Goal: Ability to disclose and discuss suicidal ideas will improve Outcome: Progressing Goal: Will verbalize positive feelings about self Outcome: Progressing

## 2022-04-15 NOTE — Tx Team (Signed)
Initial Treatment Plan 04/15/2022 2:41 PM [name] [id number]    PATIENT STRESSORS: Medication change or noncompliance     PATIENT STRENGTHS: Physical Health  Supportive family/friends    PATIENT IDENTIFIED PROBLEMS:                      DISCHARGE CRITERIA:  Ability to meet basic life and health needs Adequate post-discharge living arrangements  PRELIMINARY DISCHARGE PLAN: Return to previous living arrangement Return to previous work or school arrangements  PATIENT/FAMILY INVOLVEMENT: This treatment plan has been presented to and reviewed with the patient, [name]. The patient and family have been given the opportunity to ask questions and make suggestions.  Fritz Pickerel, RN 04/15/2022, 2:41 PM

## 2022-04-15 NOTE — [facility] Group Notes (Signed)
[place] Group Notes:  (Nursing/MHT/Case Management/Adjunct)  Date:  04/15/2022  Time:  8:58 PM  Type of Therapy:  Group Therapy  Participation Level:  Active  Participation Quality:  Appropriate  Affect:  Appropriate  Cognitive:  Appropriate  Insight:  Appropriate  Engagement in Group:  Engaged  Modes of Intervention:  Education  Summary of Progress/Problems: Participated in anger management exercise during wrap up. Day 6/10.  [name] 04/15/2022, 8:58 PM

## 2022-04-15 NOTE — ED Notes (Signed)
Pt is awake and alert.  Pt was informed that we are currently waiting for a bed at [facility].  She became upset and stated " I just want to go home".  Pt denies SI, HI or AVH at this time.   Provider made aware.

## 2022-04-15 NOTE — Progress Notes (Signed)
Pt was accepted to [facility] 04/15/22; Bed Assignment 302-1  Pt meets inpatient criteria per Rosezetta Schlatter, MD,   Attending Physician will be Dr. Caswell Corwin  Report can be called to: - Adult unit: [phone]  Pt can arrive after 1:00pm  Care Team notified: Day [place], Night [place], RN, [place], [place], Rosezetta Schlatter, MD, Anda Latina, RN, Dayna Ramus, RN, Day [facility] Lynnda Shields, RN, Marijean Niemann, LPN  [place], LCSWA 04/15/2022 @ 11:58 AM

## 2022-04-15 NOTE — ED Notes (Signed)
Pt resting at this time.  Breathing even and unlabored. Pt is in view of nursing station and being monitored.  Awaiting Bed assignment from [name].

## 2022-04-15 NOTE — Discharge Instructions (Addendum)
Follow-up recommendations:  Activity:  Normal, as tolerated Diet:  Per PCP recommendation  Patient is instructed prior to discharge to: Take all medications as prescribed by her mental healthcare provider. Report any adverse effects and/or reactions from the medicines to her outpatient provider promptly. Patient has been instructed & cautioned: To not engage in alcohol and or illegal drug use while on prescription medicines.  In the event of worsening symptoms, patient is instructed to call the crisis hotline at 988, 911 and or go to the nearest ED for appropriate evaluation and treatment of symptoms. To follow-up with her primary care provider for your other medical issues, concerns and or health care needs.  

## 2022-04-15 NOTE — ED Notes (Signed)
Patient observed/assessed in bed/chair resting quietly appearing in no distress and verbalizing no complaints at this time. Will continue to monitor.  

## 2022-04-15 NOTE — Plan of Care (Signed)
  Problem: Self-Concept: Goal: Ability to disclose and discuss suicidal ideas will improve Outcome: Progressing Goal: Will verbalize positive feelings about self Outcome: Progressing

## 2022-04-15 NOTE — Progress Notes (Signed)
Patient arrived to unit with belongings via safe transport. Patient was oriented to  room and unit, given lunch. Endorses passive SI but no plan at this time. Contracts for safety on the unit. Denies HI. Endorses auditory and visual hallucinations and states she has voices that tell her to harm herself. Patient reports she lives with her dad and grandpa,and her grandmother recently died, which is a trigger for her. Pt reports she was at [facility] in February of 2024. Q61min checks continue.

## 2022-04-15 NOTE — ED Notes (Signed)
Report called to [facility] .  Verbalized understanding.  Safe transport called at this time.

## 2022-04-15 NOTE — ED Notes (Signed)
Patient discharge via ambulatory with a steady gait with Safe Transport staff member. Respirations equal and unlabored, skin warm and dry. No acute distress noted.  

## 2022-04-15 NOTE — ED Provider Notes (Signed)
FBC/OBS ASAP Discharge Summary  Date and Time: 04/15/2022 1:34 PM  Name: [name]  MRN:  [id number]   Discharge Diagnoses:  Final diagnoses:  Suicidal ideation  Marijuana abuse  Anxious appearance  Bipolar affective disorder, depressed, severe, with psychotic behavior ([place])    Subjective: [name] is a 22 y.o. female, with [place] suicidal ideation, depression, substance abuse c/b mood disorder who presented Voluntary to [facility] (04/14/2022) for suicidal ideation with plans to overdose on medication.   Stay Summary: Patient was admitted to the observation unit where she stayed until an inpatient bed became available. On day of discharge, patient continued to endorse SI, but denied HI and AVH. She remains dysphoric and in need of inpatient hospitalization. She would like to get into [facility], and this was discussed with Ava Johnnye Sima. See plan for details.   Total Time spent with patient: 30 minutes  Past Psychiatric History: See H&P Past Medical History: See chart Family History: See chart Family Psychiatric History: See H&P Social History: See H&P Tobacco Cessation:  Prescription not provided because: Inpt psychiatric hospitalization  Current Medications:  Current Facility-Administered Medications  Medication Dose Route Frequency Provider Last Rate Last Admin   acetaminophen (TYLENOL) tablet 650 mg  650 mg Oral Q6H PRN Evette Georges, NP       alum & mag hydroxide-simeth (MAALOX/MYLANTA) 200-200-20 MG/5ML suspension 30 mL  30 mL Oral Q4H PRN Evette Georges, NP       hydrOXYzine (ATARAX) tablet 25 mg  25 mg Oral TID PRN Lucky Rathke, FNP   25 mg at 04/15/22 1016   OLANZapine zydis (ZYPREXA) disintegrating tablet 5 mg  5 mg Oral Q8H PRN Evette Georges, NP       And   LORazepam (ATIVAN) tablet 1 mg  1 mg Oral PRN Evette Georges, NP       And   ziprasidone (GEODON) injection 20 mg  20 mg Intramuscular PRN Evette Georges, NP       magnesium hydroxide (MILK OF MAGNESIA) suspension  30 mL  30 mL Oral Daily PRN Evette Georges, NP       traZODone (DESYREL) tablet 50 mg  50 mg Oral QHS PRN Onu[place], Chinwendu V, NP   50 mg at 04/14/22 2214   Current Outpatient Medications  Medication Sig Dispense Refill   hydrOXYzine (ATARAX) 25 MG tablet Take 1 tablet (25 mg total) by mouth 3 (three) times daily as needed for anxiety. 30 tablet 0   traZODone (DESYREL) 50 MG tablet Take 1 tablet (50 mg total) by mouth at bedtime as needed for sleep.      PTA Medications:  PTA Medications  Medication Sig   hydrOXYzine (ATARAX) 25 MG tablet Take 1 tablet (25 mg total) by mouth 3 (three) times daily as needed for anxiety.   traZODone (DESYREL) 50 MG tablet Take 1 tablet (50 mg total) by mouth at bedtime as needed for sleep.   Facility Ordered Medications  Medication   acetaminophen (TYLENOL) tablet 650 mg   alum & mag hydroxide-simeth (MAALOX/MYLANTA) 200-200-20 MG/5ML suspension 30 mL   magnesium hydroxide (MILK OF MAGNESIA) suspension 30 mL   OLANZapine zydis (ZYPREXA) disintegrating tablet 5 mg   And   LORazepam (ATIVAN) tablet 1 mg   And   ziprasidone (GEODON) injection 20 mg   [COMPLETED] hydrOXYzine (ATARAX) 25 MG tablet   hydrOXYzine (ATARAX) tablet 25 mg   traZODone (DESYREL) tablet 50 mg       08/04/2021    5:09 PM 05/27/2021  10:53 AM 04/10/2021    9:57 AM  Depression screen PHQ 2/9  Decreased Interest 2 3 2   Down, Depressed, Hopeless 2 3 3   PHQ - 2 Score 4 6 5   Altered sleeping 2 3 3   Tired, decreased energy 2 3 3   Change in appetite 2 3 3   Feeling bad or failure about yourself  2 3 3   Trouble concentrating 2 3 3   Moving slowly or fidgety/restless 2 3 3   Suicidal thoughts 2 3 2   PHQ-9 Score 18 27 25   Difficult doing work/chores Very difficult Extremely dIfficult Somewhat difficult    Flowsheet Row ED from 04/13/2022 in [facility] ED from 08/04/2021 in [facility] ED from 06/03/2021 in [facility] Emergency  Department at [place] High Risk High Risk No Risk       Musculoskeletal  Strength & Muscle Tone: within normal limits Gait & Station: normal Patient leans: N/A  Psychiatric Specialty Exam  Presentation  General Appearance:  Appropriate for Environment; Casual  Eye Contact: Fair  Speech: Clear and Coherent; Slow  Speech Volume: Decreased  Handedness: Right   Mood and Affect  Mood: Depressed; Hopeless  Affect: Congruent; Depressed; Blunt   Thought Process  Thought Processes: Coherent; Goal Directed; Linear  Descriptions of Associations:Intact  Orientation:Full (Time, Place and Person)  Thought Content:WDL; Logical  Diagnosis of Schizophrenia or Schizoaffective disorder in past: No    Hallucinations:Hallucinations: None  Ideas of Reference:None  Suicidal Thoughts:Suicidal Thoughts: Yes, Passive SI Passive Intent and/or Plan: Without Intent; Without Plan  Homicidal Thoughts:Homicidal Thoughts: No   Sensorium  Memory: Immediate Good; Recent Good  Judgment: Poor  Insight: Poor; Lacking   Executive Functions  Concentration: Fair  Attention Span: Fair  Recall: [place] of Knowledge: Fair  Language: Fair   Psychomotor Activity  Psychomotor Activity:Psychomotor Activity: Normal   Assets  Assets: Desire for Improvement; Housing; Leisure Time; Physical Health; Social Support   Sleep  Sleep:Sleep: Fair   Physical Exam  Physical Exam Vitals reviewed.  Constitutional:      General: She is not in acute distress. HENT:     Head: Normocephalic and atraumatic.  Pulmonary:     Effort: Pulmonary effort is normal.  Neurological:     General: No focal deficit present.     Mental Status: She is alert and oriented to person, place, and time.     Motor: No weakness.     Gait: Gait normal.  Review of Systems  Constitutional:  Positive for malaise/fatigue.  Neurological:  Positive for  headaches.  Psychiatric/Behavioral:  Positive for depression and suicidal ideas. Negative for hallucinations. The patient does not have insomnia.    Blood pressure 112/66, pulse 62, temperature 98.3 F (36.8 C), resp. rate 18, SpO2 100 %. There is no height or weight on file to calculate BMI.  Demographic Factors:  Adolescent or young adult, Low socioeconomic status, and Unemployed  Loss Factors: NA  Historical Factors: Prior suicide attempts and Impulsivity  Risk Reduction Factors:   Responsible for children under 72 years of age, Sense of responsibility to family, Living with another person, especially a relative, and Positive social support  Continued Clinical Symptoms:  Depression:   Anhedonia Hopelessness Impulsivity Severe Personality Disorders:   Cluster B Comorbid depression Unstable or Poor Therapeutic Relationship Previous Psychiatric Diagnoses and Treatments  Cognitive Features That Contribute To Risk:  Polarized thinking and Thought constriction (tunnel vision)    Suicide Risk:  Severe:  Frequent, intense, and enduring suicidal ideation, specific plan, no subjective intent, but some objective markers of intent (i.e., choice of lethal method), the method is accessible, some limited preparatory behavior, evidence of impaired self-control, severe dysphoria/symptomatology, multiple risk factors present, and few if any protective factors, particularly a lack of social support.  Plan Of Care/Follow-up recommendations:  Follow-up recommendations:  Activity:  Normal, as tolerated Diet:  Per PCP recommendation  Patient is instructed prior to discharge to: Take all medications as prescribed by her mental healthcare provider. Report any adverse effects and/or reactions from the medicines to her outpatient provider promptly. Patient has been instructed & cautioned: To not engage in alcohol and or illegal drug use while on prescription medicines.  In the event of worsening  symptoms, patient is instructed to call the crisis hotline at 988, 911 and or go to the nearest ED for appropriate evaluation and treatment of symptoms. To follow-up with her primary care provider for your other medical issues, concerns and or health care needs.   Disposition: [place], 302-1  From [place]: Per Dr. Dwyane Dee, after the patient is discharged from [facility] the plan is for the patient to be linked with services at the [facility]. Writer contacted [facility] [phone] the process if for the patient to complete phone intake referral to determine what services are best for the patient. Writer left my name and number for the intake worker Jabier Gauss [phone]) to contact me back and I can allow the patient to complete the intake referral. 10:40am The intake worker called back and stated that the patient is already established with the facility and they are able to schedule an assessment for the patient on 04-29-2022 at 11am in order for her to receive [facility] ([place]). Writer informed Dr. Earley Favor and the NP working with the patient.   Rosezetta Schlatter, MD 04/15/2022, 1:34 PM

## 2022-04-15 NOTE — Care Management (Addendum)
OBS Care Management   Per Dr. Dwyane Dee, after the patient is discharged from [facility] the plan is for the patient to go inpatient at [facility] and then linked with services at the [facility].    Writer contacted the [facility] [phone] the process if for the patient to complete phone intake referral to determine what services are best for the patient.   Writer left my name and number for the intake worker Jabier Gauss [phone]) to contact me back and I can allow the patient to complete the intake referral.   10:40am  The intake worker called back and stated that the patient is already established with the facility and they are able to schedule an assessment for the patient on 04-29-2022 at 11am in order for her to receive [facility] ([place]).  Writer informed Dr,. Cosby and the NP working with the patient.

## 2022-04-16 ENCOUNTER — Encounter (HOSPITAL_COMMUNITY): Payer: Self-pay

## 2022-04-16 DIAGNOSIS — F315 Bipolar disorder, current episode depressed, severe, with psychotic features: Secondary | ICD-10-CM | POA: Diagnosis not present

## 2022-04-16 MED ORDER — ESCITALOPRAM OXALATE 5 MG PO TABS
5.0000 mg | ORAL_TABLET | Freq: Every day | ORAL | Status: AC
  Administered 2022-04-16 – 2022-04-17 (×2): 5 mg via ORAL
  Filled 2022-04-16 (×2): qty 1

## 2022-04-16 MED ORDER — ARIPIPRAZOLE 5 MG PO TABS
5.0000 mg | ORAL_TABLET | Freq: Every day | ORAL | Status: DC
  Administered 2022-04-17 – 2022-04-20 (×4): 5 mg via ORAL
  Filled 2022-04-16 (×7): qty 1

## 2022-04-16 MED ORDER — ARIPIPRAZOLE 2 MG PO TABS
2.0000 mg | ORAL_TABLET | Freq: Every day | ORAL | Status: AC
  Administered 2022-04-16: 2 mg via ORAL
  Filled 2022-04-16: qty 1

## 2022-04-16 MED ORDER — NICOTINE POLACRILEX 2 MG MT GUM: 2.0000 mg | CHEWING_GUM | OROMUCOSAL | Status: DC | PRN

## 2022-04-16 MED ORDER — PRAZOSIN HCL 1 MG PO CAPS
1.0000 mg | ORAL_CAPSULE | Freq: Every day | ORAL | Status: DC
  Administered 2022-04-16 – 2022-04-19 (×4): 1 mg via ORAL
  Filled 2022-04-16 (×6): qty 1

## 2022-04-16 MED ORDER — WHITE PETROLATUM EX OINT
TOPICAL_OINTMENT | CUTANEOUS | Status: AC
  Filled 2022-04-16: qty 5

## 2022-04-16 MED ORDER — ESCITALOPRAM OXALATE 10 MG PO TABS
10.0000 mg | ORAL_TABLET | Freq: Every day | ORAL | Status: DC
  Administered 2022-04-18 – 2022-04-20 (×3): 10 mg via ORAL
  Filled 2022-04-16 (×5): qty 1

## 2022-04-16 NOTE — Progress Notes (Signed)
AxOx4. Denies SI/HI/AVH today. Denies pain. Reports minimal anxiety and depression.   04/16/22 1000  Psych Admission Type (Psych Patients Only)  Admission Status Voluntary  Psychosocial Assessment  Patient Complaints Anxiety;Depression  Eye Contact Brief  Facial Expression Flat  Affect Appropriate to circumstance  Speech Soft  Interaction Assertive  Motor Activity Fidgety  Appearance/Hygiene Unremarkable  Behavior Characteristics Cooperative  Mood Anxious  Aggressive Behavior  Effect No apparent injury  Thought Process  Coherency WDL  Content WDL  Delusions None reported or observed  Perception WDL  Hallucination None reported or observed  Judgment Impaired  Confusion None  Danger to Self  Current suicidal ideation? Denies  Agreement Not to Harm Self Yes  Description of Agreement verbal  Danger to Others  Danger to Others None reported or observed

## 2022-04-16 NOTE — Group Note (Signed)
Recreation Therapy Group Note   Group Topic:Problem Solving  Group Date: 04/16/2022 Start Time: 0930 End Time: 1015 Facilitators: Labrittany Wechter-McCall, LRT,CTRS Location: [address] Dayroom   Goal Area(s) Addresses:  Patient will effectively work with peer towards shared goal.  Patient will identify skills used to make activity successful.  Patient will share challenges and verbalize solution-driven approaches used. Patient will identify how skills used during activity can be used to reach post d/c goals.    Group Description: [employer]. Patients were provided the following materials: 2 drinking straws, 5 rubber bands, 5 paper clips, 2 index cards and 2 drinking cups. Using the provided materials patients were asked to build a launching mechanism to launch a ping pong ball across the room, approximately 10 feet. Patients were divided into teams of 3-5. Instructions required all materials be incorporated into the device, functionality of items left to the peer group's discretion.   Affect/Mood: N/A   Participation Level: Did not attend    Clinical Observations/Individualized Feedback:     Plan: Continue to engage patient in RT group sessions 2-3x/week.   Moyinoluwa Dawe-McCall, LRT,CTRS 04/16/2022 11:25 AM

## 2022-04-16 NOTE — Progress Notes (Signed)
   04/16/22 2200  Psych Admission Type (Psych Patients Only)  Admission Status Voluntary  Psychosocial Assessment  Patient Complaints Anxiety  Eye Contact Brief  Facial Expression Animated  Affect Appropriate to circumstance  Speech Soft  Interaction Assertive  Motor Activity Fidgety  Appearance/Hygiene Unremarkable  Behavior Characteristics Cooperative  Mood Anxious  Thought Process  Coherency WDL  Content WDL  Delusions None reported or observed  Perception WDL  Hallucination None reported or observed  Judgment Impaired  Confusion None  Danger to Self  Current suicidal ideation? Denies  Danger to Others  Danger to Others None reported or observed   Alert/oriented. Makes needs/concerns known to staff. Pleasant cooperative with staff. Denies SI/HI/A/V hallucinations. Med compliant. Patient states went to group. Will encourage continued compliance and progression towards goals. Verbally contracted for safety. Will continue to monitor.
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
## 2022-04-16 NOTE — Plan of Care (Signed)
  Problem: Medication: Goal: Compliance with prescribed medication regimen will improve Outcome: Progressing   

## 2022-04-16 NOTE — Progress Notes (Signed)
Adult Psychoeducational Group Note  Date:  04/16/2022 Time:  5:12 PM  Group Topic/Focus:  Goals Group:   The focus of this group is to help patients establish daily goals to achieve during treatment and discuss how the patient can incorporate goal setting into their daily lives to aide in recovery. Orientation:   The focus of this group is to educate the patient on the purpose and policies of crisis stabilization and provide a format to answer questions about their admission.  The group details unit policies and expectations of patients while admitted.  Participation Level:  Active  Participation Quality:  Appropriate  Affect:  Appropriate  Cognitive:  Appropriate  Insight: Appropriate  Engagement in Group:  Engaged  Modes of Intervention:  Discussion  Additional Comments:  Pt attended the goals/orientation group and remained appropriate and engaged throughout the duration of the group.   Beryle Beams 04/16/2022, 5:12 PM
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
## 2022-04-16 NOTE — BHH Suicide Risk Assessment (Signed)
[facility] Admission Suicide Risk Assessment   Nursing information obtained from:  Patient Demographic factors:  Adolescent or young adult, 2, lesbian, or bisexual orientation Current Mental Status:  Suicidal ideation indicated by patient Loss Factors:  NA Historical Factors:  Prior suicide attempts, Family history of mental illness or substance abuse Risk Reduction Factors:  Living with another person, especially a relative, Positive social support  Total Time spent with patient: 30 minutes Principal Problem: Bipolar affective disorder, current episode depressed ([place]) Diagnosis:  Principal Problem:   Bipolar affective disorder, current episode depressed ([place])  Subjective Data: See H&P   Continued Clinical Symptoms:  Alcohol Use Disorder Identification Test Final Score (AUDIT): 0 The "Alcohol Use Disorders Identification Test", Guidelines for Use in Primary Care, Second Edition.  World Pharmacologist [facility]). Score between 0-7:  no or low risk or alcohol related problems. Score between 8-15:  moderate risk of alcohol related problems. Score between 16-19:  high risk of alcohol related problems. Score 20 or above:  warrants further diagnostic evaluation for alcohol dependence and treatment.   CLINICAL FACTORS:   Severe Anxiety and/or Agitation Panic Attacks Bipolar Disorder:   Depressive phase More than one psychiatric diagnosis Unstable or Poor Therapeutic Relationship Previous Psychiatric Diagnoses and Treatments   Musculoskeletal: Strength & Muscle Tone: within normal limits Gait & Station: normal Patient leans: N/A  Psychiatric Specialty Exam:  Presentation  General Appearance:  Casual  Eye Contact: Poor  Speech: Normal Rate  Speech Volume: Decreased  Handedness: Right   Mood and Affect  Mood: Anxious; Depressed  Affect: Depressed   Thought Process  Thought Processes: Coherent; Goal Directed; Linear  Descriptions of  Associations:Intact  Orientation:Full (Time, Place and Person)  Thought Content:Logical  History of Schizophrenia/Schizoaffective disorder:No  Duration of Psychotic Symptoms:Less than six months  Hallucinations:Hallucinations: None Description of Auditory Hallucinations: had cah to harm self on admission but no longer having  Ideas of Reference:None  Suicidal Thoughts:Suicidal Thoughts: Yes, Passive SI Active Intent and/or Plan: Without Intent; Without Plan SI Passive Intent and/or Plan: Without Intent; Without Plan  Homicidal Thoughts:Homicidal Thoughts: No   Sensorium  Memory: Immediate Fair; Recent Fair; Remote Fair  Judgment: Impaired  Insight: Lacking   Executive Functions  Concentration: Fair  Attention Span: Fair  Recall: [employer] of Knowledge: Fair  Language: Fair   Psychomotor Activity  Psychomotor Activity: Psychomotor Activity: Normal   Assets  Assets: Desire for Improvement; Housing; Leisure Time; Physical Health; Social Support   Sleep  Sleep: Sleep: Poor    Physical Exam: Physical Exam See H&P ROS See H&P Blood pressure (!) 141/91, pulse 88, temperature 97.6 F (36.4 C), resp. rate 18, height 5\' 9"  (1.753 m), weight 97.3 kg, SpO2 100 %. Body mass index is 31.69 kg/m.   COGNITIVE FEATURES THAT CONTRIBUTE TO RISK:  None    SUICIDE RISK:   Moderate:  Frequent suicidal ideation with limited intensity, and duration, some specificity in terms of plans, no associated intent, good self-control, limited dysphoria/symptomatology, some risk factors present, and identifiable protective factors, including available and accessible social support.  PLAN OF CARE: See H&P  I certify that inpatient services furnished can reasonably be expected to improve the patient's condition.   Christoper Allegra, MD 04/16/2022, 3:35 PM

## 2022-04-16 NOTE — BHH Group Notes (Signed)
[place] Group Notes:  (Nursing/MHT/Case Management/Adjunct)  Date:  04/16/2022  Time:  8:31 PM  Type of Therapy:   AA  Participation Level:  Active  Participation Quality:  Appropriate  Affect:  Appropriate  Cognitive:  Appropriate  Insight:  Appropriate  Engagement in Group:  Engaged  Modes of Intervention:  Education  Summary of Progress/Problems: Attended AA group.  [name] 04/16/2022, 8:31 PM

## 2022-04-16 NOTE — BH IP Treatment Plan (Signed)
Interdisciplinary Treatment and Diagnostic Plan Update  04/16/2022 Time of Session: [place] MRN: [id number]  Principal Diagnosis: Bipolar affective disorder, current episode depressed ([place])  Secondary Diagnoses: Principal Problem:   Bipolar affective disorder, current episode depressed ([place])   Current Medications:  Current Facility-Administered Medications  Medication Dose Route Frequency Provider Last Rate Last Admin   acetaminophen (TYLENOL) tablet 650 mg  650 mg Oral Q6H PRN Rosezetta Schlatter, MD       alum & mag hydroxide-simeth (MAALOX/MYLANTA) 200-200-20 MG/5ML suspension 30 mL  30 mL Oral Q4H PRN Rosezetta Schlatter, MD       diphenhydrAMINE (BENADRYL) capsule 50 mg  50 mg Oral TID PRN Rosezetta Schlatter, MD       Or   diphenhydrAMINE (BENADRYL) injection 50 mg  50 mg Intramuscular TID PRN Rosezetta Schlatter, MD       haloperidol (HALDOL) tablet 5 mg  5 mg Oral TID PRN Rosezetta Schlatter, MD       Or   haloperidol lactate (HALDOL) injection 5 mg  5 mg Intramuscular TID PRN Rosezetta Schlatter, MD       hydrOXYzine (ATARAX) tablet 25 mg  25 mg Oral TID PRN Rosezetta Schlatter, MD   25 mg at 04/15/22 2123   LORazepam (ATIVAN) tablet 2 mg  2 mg Oral TID PRN Rosezetta Schlatter, MD       Or   LORazepam (ATIVAN) injection 2 mg  2 mg Intramuscular TID PRN Rosezetta Schlatter, MD       magnesium hydroxide (MILK OF MAGNESIA) suspension 30 mL  30 mL Oral Daily PRN Rosezetta Schlatter, MD       traZODone (DESYREL) tablet 50 mg  50 mg Oral QHS PRN Rosezetta Schlatter, MD   50 mg at 04/15/22 2123   PTA Medications: Medications Prior to Admission  Medication Sig Dispense Refill Last Dose   hydrOXYzine (ATARAX) 25 MG tablet Take 1 tablet (25 mg total) by mouth 3 (three) times daily as needed for anxiety. 30 tablet 0    traZODone (DESYREL) 50 MG tablet Take 1 tablet (50 mg total) by mouth at bedtime as needed for sleep.       Patient Stressors: Medication change or noncompliance    Patient Strengths: Physical Health   Supportive family/friends   Treatment Modalities: Medication Management, Group therapy, Case management,  1 to 1 session with clinician, Psychoeducation, Recreational therapy.   Physician Treatment Plan for Primary Diagnosis: Bipolar affective disorder, current episode depressed ([place]) Long Term Goal(s):     Short Term Goals:    Medication Management: Evaluate patient's response, side effects, and tolerance of medication regimen.  Therapeutic Interventions: 1 to 1 sessions, Unit Group sessions and Medication administration.  Evaluation of Outcomes: Progressing  Physician Treatment Plan for Secondary Diagnosis: Principal Problem:   Bipolar affective disorder, current episode depressed ([place])  Long Term Goal(s):     Short Term Goals:       Medication Management: Evaluate patient's response, side effects, and tolerance of medication regimen.  Therapeutic Interventions: 1 to 1 sessions, Unit Group sessions and Medication administration.  Evaluation of Outcomes: Progressing   RN Treatment Plan for Primary Diagnosis: Bipolar affective disorder, current episode depressed ([place]) Long Term Goal(s): Knowledge of disease and therapeutic regimen to maintain health will improve  Short Term Goals: Ability to remain free from injury will improve, Ability to verbalize frustration and anger appropriately will improve, Ability to demonstrate self-control, Ability to participate in decision making will improve, Ability to verbalize feelings will improve, Ability  to disclose and discuss suicidal ideas, Ability to identify and develop effective coping behaviors will improve, and Compliance with prescribed medications will improve  Medication Management: RN will administer medications as ordered by provider, will assess and evaluate patient's response and provide education to patient for prescribed medication. RN will report any adverse and/or side effects to prescribing provider.  Therapeutic  Interventions: 1 on 1 counseling sessions, Psychoeducation, Medication administration, Evaluate responses to treatment, Monitor vital signs and CBGs as ordered, Perform/monitor CIWA, COWS, AIMS and Fall Risk screenings as ordered, Perform wound care treatments as ordered.  Evaluation of Outcomes: Progressing   LCSW Treatment Plan for Primary Diagnosis: Bipolar affective disorder, current episode depressed ([place]) Long Term Goal(s): Safe transition to appropriate next level of care at discharge, Engage patient in therapeutic group addressing interpersonal concerns.  Short Term Goals: Engage patient in aftercare planning with referrals and resources, Increase social support, Increase ability to appropriately verbalize feelings, Increase emotional regulation, Facilitate acceptance of mental health diagnosis and concerns, Facilitate patient progression through stages of change regarding substance use diagnoses and concerns, Identify triggers associated with mental health/substance abuse issues, and Increase skills for wellness and recovery  Therapeutic Interventions: Assess for all discharge needs, 1 to 1 time with Social worker, Explore available resources and support systems, Assess for adequacy in community support network, Educate family and significant other(s) on suicide prevention, Complete Psychosocial Assessment, Interpersonal group therapy.  Evaluation of Outcomes: Progressing   Progress in Treatment: Attending groups: Yes. Participating in groups: Yes. Taking medication as prescribed: Yes. Toleration medication: Yes. Family/Significant other contact made: No, will contact:  Pending Patient understands diagnosis: Yes. Discussing patient identified problems/goals with staff: Yes. Medical problems stabilized or resolved: Yes. Denies suicidal/homicidal ideation: Yes. Issues/concerns per patient self-inventory: Yes. Other:   New problem(s) identified: No, Describe:  None Reported  New  Short Term/Long Term Goal(s): ): medication stabilization, elimination of SI thoughts, development of comprehensive mental wellness plan  Patient Goals:  "[facility] the [facility]"   Discharge Plan or Barriers: Patient recently admitted. CSW will continue to follow and assess for appropriate referrals and possible discharge planning.      Reason for Continuation of Hospitalization: Anxiety Depression [place] Medication stabilization Suicidal ideation Withdrawal symptoms  Estimated Length of Stay: 3-7 Days  Last 3 [place] Suicide Severity Risk Score: [place] Admission (Current) from 04/15/2022 in [place] [facility] ED from 04/13/2022 in [facility] ED from 08/04/2021 in [place] CATEGORY High Risk High Risk High Risk       Last [facility] 2/9 Scores:    08/04/2021    5:09 PM 05/27/2021   10:53 AM 04/10/2021    9:57 AM  Depression screen PHQ 2/9  Decreased Interest 2 3 2   Down, Depressed, Hopeless 2 3 3   PHQ - 2 Score 4 6 5   Altered sleeping 2 3 3   Tired, decreased energy 2 3 3   Change in appetite 2 3 3   Feeling bad or failure about yourself  2 3 3   Trouble concentrating 2 3 3   Moving slowly or fidgety/restless 2 3 3   Suicidal thoughts 2 3 2   PHQ-9 Score 18 27 25   Difficult doing work/chores Very difficult Extremely dIfficult Somewhat difficult     medication stabilization, elimination of SI thoughts, development of comprehensive mental wellness plan.   Scribe for Treatment Team: Windle Guard, LCSW 04/16/2022 2:15 PM

## 2022-04-16 NOTE — Progress Notes (Signed)
   04/16/22 0612  15 Minute Checks  Location Bedroom  Visual Appearance Calm  Behavior Composed  Sleep (Behavioral Health Patients Only)  Calculate sleep? (Click Yes once per 24 hr at 0600 safety check) Yes  Documented sleep last 24 hours 6.75

## 2022-04-16 NOTE — Progress Notes (Signed)
   04/15/22 2100  Psych Admission Type (Psych Patients Only)  Admission Status Voluntary  Psychosocial Assessment  Patient Complaints Anxiety  Eye Contact Brief  Facial Expression Flat  Affect Appropriate to circumstance  Speech Soft  Interaction Assertive  Motor Activity Fidgety  Appearance/Hygiene Unremarkable  Behavior Characteristics Cooperative;Appropriate to situation  Mood Anxious  Aggressive Behavior  Effect No apparent injury  Thought Process  Coherency WDL  Content WDL  Delusions None reported or observed  Perception WDL  Hallucination None reported or observed  Judgment Impaired  Confusion None  Danger to Self  Current suicidal ideation? Denies  Agreement Not to Harm Self Yes  Description of Agreement verbal  Danger to Others  Danger to Others None reported or observed

## 2022-04-17 DIAGNOSIS — F314 Bipolar disorder, current episode depressed, severe, without psychotic features: Secondary | ICD-10-CM | POA: Diagnosis not present

## 2022-04-17 MED ORDER — WHITE PETROLATUM EX OINT
TOPICAL_OINTMENT | CUTANEOUS | Status: AC
  Filled 2022-04-17: qty 5

## 2022-04-17 NOTE — Group Note (Signed)
Date:  04/17/2022 Time:  2:52 PM  Group Topic/Focus:  Goals Group:   The focus of this group is to help patients establish daily goals to achieve during treatment and discuss how the patient can incorporate goal setting into their daily lives to aide in recovery. Orientation:   The focus of this group is to educate the patient on the purpose and policies of crisis stabilization and provide a format to answer questions about their admission.  The group details unit policies and expectations of patients while admitted.    Participation Level:  Active  Participation Quality:  Attentive  Affect:  Appropriate  Cognitive:  Appropriate  Insight: Appropriate  Engagement in Group:  Engaged  Modes of Intervention:  Discussion  Additional Comments:  Patient attended group and was attentive the duration of it.   Daneille Desilva T Ria Comment 04/17/2022, 2:52 PM

## 2022-04-17 NOTE — BHH Group Notes (Signed)
[place] Group Notes:  (Nursing/MHT/Case Management/Adjunct)  Date:  04/17/2022  Time:  9:32 PM  Type of Therapy:  Group Therapy  Participation Level:  Active  Participation Quality:  Appropriate  Affect:  Appropriate  Cognitive:  Appropriate  Insight:  Appropriate  Engagement in Group:  Engaged  Modes of Intervention:  Education  Summary of Progress/Problems:Goal to be peaceful. Day 7/10.  [name] 04/17/2022, 9:32 PM

## 2022-04-17 NOTE — Progress Notes (Signed)
[facility] MD Progress Note  Date: 04/17/2022 2:44 PM Name: [name]  DOB: 08-04-2000  MRN:  [id number] Unit: [id number]  CC: SI and command AH  [name] is a 22 y.o. female with a reported psychiatric history of borderline personality disorder, bipolar disorder, PTSD, GAD, who was admitted to the psychiatric unit for worsening depression, suicidal thoughts, and command auditory hallucinations to harm herself.   Last 24h: No acute events overnight Documented sleep last 24 hours: 7   Subjective:  Reported feeling tired and very anxious, rating it 10/10.  Reported passive SI, saying "what do I have to live for".  Contracted to safety.  Denied thoughts of self-harm. Reported having malodorous, yellow vaginal discharge.  She was amenable to STI workup along with RPR and HIV and hepatitis.  Mood: Anxious, Depressed, Hopeless Sleep:Good - Reported her sleep was good, although she still tired today.  No issues falling or staying asleep.  Denied any nightmares.   Appetite: Good  Suicidal Thoughts: Yes, Passive SI Passive Intent and/or Plan: Without Intent, Without Plan Homicidal Thoughts: No Hallucinations: None Description of Auditory Hallucinations: Denied AH    Review of Systems  Constitutional:  Positive for malaise/fatigue.  Respiratory:  Negative for shortness of breath.   Cardiovascular:  Negative for chest pain.  Gastrointestinal:  Positive for vomiting. Negative for abdominal pain, constipation, diarrhea and nausea.  Genitourinary:  Negative for dysuria, flank pain, frequency, hematuria and urgency.  Musculoskeletal:  Negative for myalgias.  Neurological:  Negative for dizziness, tremors, weakness and headaches.      Principal Problem: Severe bipolar I disorder, most recent episode depressed ([place]) Diagnosis: Principal Problem:   Severe bipolar I disorder, most recent episode depressed ([place]) Active Problems:   Borderline personality disorder (HCC)   Generalized  anxiety disorder   Hx of suicide attempt   PTSD (post-traumatic stress disorder)   Past Psychiatric History: See H&P Past Medical History:  Past Medical History:  Diagnosis Date   Anxiety    Asthma    Headache(784.0)    Hx of suicide attempt    Major depressive disorder    Morbid obesity ([place]) 03/25/2020   PTSD (post-traumatic stress disorder)     Past Surgical History:  Procedure Laterality Date   NO PAST SURGERIES     wisdom tooth extraction     Family History:  Family History  Problem Relation Age of Onset   Diabetes Maternal Aunt    Diabetes Maternal Grandmother    Cancer Maternal Grandmother    Breast cancer Maternal Grandmother    Breast cancer Other    Family Psychiatric History: See H&P Social History:  Social History   Substance and Sexual Activity  Alcohol Use Not Currently     Social History   Substance and Sexual Activity  Drug Use Not Currently   Types: Marijuana, MDMA (Ecstacy), Cocaine   Comment: Coc-last use 2/27; marijuana and MDMA last use 2/28    Social History   Socioeconomic History   Marital status: Single    Spouse name: Not on file   Number of children: 2   Years of education: Not on file   Highest education level: Some college, no degree  Occupational History   Not on file  Tobacco Use   Smoking status: Every Day    Packs/day: 0.50    Years: 4.00    Additional pack years: 0.00    Total pack years: 2.00    Types: Cigarettes, Cigars    Passive exposure: Past  Smokeless tobacco: Never   Tobacco comments:    black and mild with THC  Vaping Use   Vaping Use: Never used  Substance and Sexual Activity   Alcohol use: Not Currently   Drug use: Not Currently    Types: Marijuana, MDMA (Ecstacy), Cocaine    Comment: Coc-last use 2/27; marijuana and MDMA last use 2/28   Sexual activity: Yes    Partners: Male    Birth control/protection: None  Other Topics Concern   Not on file  Social History Narrative   Pt and family are  homeless. She, her wife and two kids live in her cousin's house         04/15/22 pt reported to this Probation officer that she lives with her grandpa and her dad, and her grandmother recently died.   Social Determinants of Health   Financial Resource Strain: Not on file  Food Insecurity: Patient Declined (04/15/2022)   Hunger Vital Sign    Worried About Running Out of Food in the Last Year: Patient declined    [place] in the Last Year: Patient declined  Transportation Needs: Patient Declined (04/15/2022)   [place] - Hydrologist (Medical): Patient declined    Lack of Transportation (Non-Medical): Patient declined  Physical Activity: Not on file  Stress: Not on file  Social Connections: Not on file   Additional Social History:                         Current Medications: Current Facility-Administered Medications  Medication Dose Route Frequency Provider Last Rate Last Admin   acetaminophen (TYLENOL) tablet 650 mg  650 mg Oral Q6H PRN Rosezetta Schlatter, MD       alum & mag hydroxide-simeth (MAALOX/MYLANTA) 200-200-20 MG/5ML suspension 30 mL  30 mL Oral Q4H PRN Rosezetta Schlatter, MD       ARIPiprazole (ABILIFY) tablet 5 mg  5 mg Oral Daily Massengill, Nathan, MD   5 mg at 04/17/22 0744   diphenhydrAMINE (BENADRYL) capsule 50 mg  50 mg Oral TID PRN Rosezetta Schlatter, MD       Or   diphenhydrAMINE (BENADRYL) injection 50 mg  50 mg Intramuscular TID PRN Rosezetta Schlatter, MD       Derrill Memo ON 04/18/2022] escitalopram (LEXAPRO) tablet 10 mg  10 mg Oral Daily Massengill, Nathan, MD       haloperidol (HALDOL) tablet 5 mg  5 mg Oral TID PRN Rosezetta Schlatter, MD       Or   haloperidol lactate (HALDOL) injection 5 mg  5 mg Intramuscular TID PRN Rosezetta Schlatter, MD       hydrOXYzine (ATARAX) tablet 25 mg  25 mg Oral TID PRN Rosezetta Schlatter, MD   25 mg at 04/15/22 2123   LORazepam (ATIVAN) tablet 2 mg  2 mg Oral TID PRN Rosezetta Schlatter, MD       Or   LORazepam (ATIVAN)  injection 2 mg  2 mg Intramuscular TID PRN Rosezetta Schlatter, MD       magnesium hydroxide (MILK OF MAGNESIA) suspension 30 mL  30 mL Oral Daily PRN Rosezetta Schlatter, MD       nicotine polacrilex (NICORETTE) gum 2 mg  2 mg Oral PRN Massengill, Nathan, MD       prazosin (MINIPRESS) capsule 1 mg  1 mg Oral QHS Massengill, Nathan, MD   1 mg at 04/16/22 2133   traZODone (DESYREL) tablet 50 mg  50 mg Oral  QHS PRN Rosezetta Schlatter, MD   50 mg at 04/16/22 2135    Lab Results:  No results found for this or any previous visit (from the past 48 hour(s)).  Blood Alcohol level:  Lab Results  Component Value Date   ETH <10 06/03/2021   ETH <10 [id number]    Metabolic Disorder Labs: Lab Results  Component Value Date   HGBA1C 5.4 04/13/2022   MPG 108 04/13/2022   MPG 108.28 09/16/2020   Lab Results  Component Value Date   PROLACTIN 6.6 12/28/2018   PROLACTIN 5.8 08/23/2017   Lab Results  Component Value Date   CHOL 176 04/13/2022   TRIG 86 04/13/2022   HDL 45 04/13/2022   CHOLHDL 3.9 04/13/2022   VLDL 17 04/13/2022   LDLCALC 114 (H) 04/13/2022   LDLCALC 112 (H) 09/17/2020    Physical Findings: BP 118/75 (BP Location: Right Arm)   Pulse (!) 112   Temp (!) 97.5 F (36.4 C)   Resp 20   Ht 5\' 9"  (1.753 m)   Wt 97.3 kg   SpO2 100%   BMI 31.69 kg/m   Physical Exam Physical Exam Vitals and nursing note reviewed.  Constitutional:      General: She is awake. She is not in acute distress.    Appearance: She is not ill-appearing, toxic-appearing or diaphoretic.  HENT:     Head: Normocephalic.     Nose: No congestion.  Pulmonary:     Effort: Pulmonary effort is normal. No respiratory distress.  Neurological:     Mental Status: She is alert.     AIMS:   ,  ,  ,  ,     CIWA:  COWS:    Psychiatric Specialty Exam: Presentation  General Appearance: Appropriate for Environment; Casual; Fairly Groomed  Eye Contact: Minimal  Speech: Clear and Coherent; Normal Rate  Volume:  Decreased  Handedness:Right   Mood and Affect  Mood: Anxious; Depressed; Hopeless  Affect: Appropriate; Congruent; Full Range   Thought Process  Thought Process: Coherent; Goal Directed; Linear  Descriptions of Associations: Intact   Thought Content Suicidal Thoughts:Yes, Passive Without Intent; Without Plan Without Intent; Without Plan  Homicidal Thoughts:No Without Intent; Without Plan  Hallucinations:None Denied AH Spirits, demons, and ttear things down  Ideas of Reference:None  Thought Content:Rumination; Perseveration   Sensorium  Memory:Immediate Good  Judgment:Impaired  Insight:Shallow   Executive Functions  Orientation:Full (Time, Place and Person)  Language:Good  Concentration:Good  [place] of Knowledge:Good   Psychomotor Activity  Psychomotor Activity:Psychomotor Retardation   Assets  Assets:Communication Skills; Desire for Improvement; Resilience; Housing; Physical Health; Talents/Skills   Sleep  Quality:Good  Documented sleep last 24 hours: 7   ASSESSMENT/PLAN: Diagnoses / Active Problems: Principal Problem:   Severe bipolar I disorder, most recent episode depressed ([place]) Active Problems:   Borderline personality disorder (HCC)   Generalized anxiety disorder   Hx of suicide attempt   PTSD (post-traumatic stress disorder)   Safety and Monitoring: Voluntary admission to inpatient psychiatric unit for safety, stabilization and treatment   Daily contact with patient to assess and evaluate symptoms and progress in treatment Patient's case to be discussed in multi-disciplinary team meeting Observation Level: q15 minute checks  Vital signs: q12 hours Precautions: suicide, elopement, and assault  2. Psychiatric Diagnoses and Treatment:  Continued Abilify 5 mg once daily.  For bipolar disorder INCREASED Lexapro 5 mg to 10 mg once daily.  For GAD and PTSD Hold lamotrigine Hold buspirone as patient  does not believe this was efficacious Continued prazosin 1 mg nightly for PTSD related nightmares Continued trazodone 50 mg nightly as needed for insomnia   3. Medical Issues Being Addressed:  Vaginal discharge Malodorous, yellow.  Denied pruritus, pain on urination. STI panel RPR HIV  4. Discharge Planning:  Social work and case management to assist with discharge planning and identification of hospital follow-up needs prior to discharge Estimated LOS: 5-7 days Date: TBA  Barrier: Med management Location: TBA  Discharge Concerns: Need to establish a safety plan; Medication compliance and effectiveness Discharge Goals: Return home with outpatient referrals for mental health follow-up including medication management/psychotherapy   Treatment Plan Summary: Daily contact with patient to assess and evaluate symptoms and progress in treatment and Medication management  Total Time spent with patient: See attending attestation Patient's case was discussed with Attending Dr. Winfred Leeds  Signed: Merrily Brittle, DO Psychiatry Resident, PGY-2 [facility] - [facility]  [address] [place], [place] [address] Ph: [phone] Fax: [phone] 04/17/2022, 2:44 PM

## 2022-04-17 NOTE — Progress Notes (Signed)
Pt on unit, attending group and compliant with medications. Pt denies AI/HI/self harm thoughts as well as a/v hallucinations. Q 15 minute checks.

## 2022-04-18 DIAGNOSIS — F314 Bipolar disorder, current episode depressed, severe, without psychotic features: Secondary | ICD-10-CM

## 2022-04-18 LAB — HEPATITIS PANEL, ACUTE
HCV Ab: NONREACTIVE
Hep A IgM: NONREACTIVE
Hep B C IgM: NONREACTIVE
Hepatitis B Surface Ag: NONREACTIVE

## 2022-04-18 LAB — HIV ANTIBODY (ROUTINE TESTING W REFLEX): HIV Screen 4th Generation wRfx: NONREACTIVE

## 2022-04-18 NOTE — Progress Notes (Signed)
   04/18/22 2200  Psych Admission Type (Psych Patients Only)  Admission Status Voluntary  Psychosocial Assessment  Patient Complaints None  Eye Contact Fair  Facial Expression Animated  Affect Appropriate to circumstance  Speech Logical/coherent  Interaction Assertive  Motor Activity Slow  Appearance/Hygiene Unremarkable  Behavior Characteristics Cooperative  Mood Pleasant  Thought Process  Coherency WDL  Content WDL  Delusions None reported or observed  Perception WDL  Hallucination None reported or observed  Judgment Impaired  Confusion None  Danger to Self  Current suicidal ideation? Denies  Agreement Not to Harm Self Yes  Description of Agreement verbal   Alert/oriented. Makes needs/concerns known to staff. Pleasant cooperative with staff. Denies SI/HI/A/V hallucinations. Med compliant. PRN med given with good effect. Patient states went to group. Will encourage continued compliance and progression towards goals. Verbally contracted for safety. Will continue to monitor.

## 2022-04-18 NOTE — Progress Notes (Signed)
[facility] MD Progress Note  Date: 04/18/2022 11:39 AM Name: [name]  DOB: 18-Jul-2000  MRN:  [id number] Unit: [id number]  CC: SI and command AH  [name] is a 22 y.o. female with a reported psychiatric history of borderline personality disorder, bipolar disorder, PTSD, GAD, who was admitted to the psychiatric unit for worsening depression, suicidal thoughts, and command auditory hallucinations to harm herself.   Last 24h: No acute events overnight Documented sleep last 24 hours: 7   Subjective:  Reported feeling "calm", although still anxious and depressed.  She inquired about going home, that she cannot stay here much longer.  She understands she cannot be discharged today and that she needs to discuss this with weekday provider.  When commented on how patient says she is "better", however she still appears restricted and anxious, patient became mildly irritable.  She denied side effects to increased Lexapro dose.  Today she denied SI/HI/AVH, paranoia.  Patient continues to have vaginal discharge, that has not worsened.  Reported that her sleep and appetite are stable and appropriate.  Mood: Anxious, Dysphoric, Irritable Sleep:Good  Appetite: Good  Suicidal Thoughts: No SI Active Intent and/or Plan:  (.) SI Passive Intent and/or Plan:  (.) Homicidal Thoughts: No Hallucinations: None Description of Auditory Hallucinations: Denied AH    Review of Systems  Respiratory:  Negative for shortness of breath.   Cardiovascular:  Negative for chest pain.  Gastrointestinal:  Negative for abdominal pain, constipation, diarrhea, nausea and vomiting.  Genitourinary:  Negative for dysuria, flank pain, frequency, hematuria and urgency.  Musculoskeletal:  Negative for myalgias.  Neurological:  Negative for dizziness, tremors, weakness and headaches.      Principal Problem: Severe bipolar I disorder, most recent episode depressed ([place]) Diagnosis: Principal Problem:   Severe bipolar I  disorder, most recent episode depressed ([place]) Active Problems:   Borderline personality disorder (HCC)   Generalized anxiety disorder   Hx of suicide attempt   PTSD (post-traumatic stress disorder)   Past Psychiatric History: See H&P Past Medical History:  Past Medical History:  Diagnosis Date   Anxiety    Asthma    Headache(784.0)    Hx of suicide attempt    Major depressive disorder    Morbid obesity ([place]) 03/25/2020   PTSD (post-traumatic stress disorder)     Past Surgical History:  Procedure Laterality Date   NO PAST SURGERIES     wisdom tooth extraction     Family History:  Family History  Problem Relation Age of Onset   Diabetes Maternal Aunt    Diabetes Maternal Grandmother    Cancer Maternal Grandmother    Breast cancer Maternal Grandmother    Breast cancer Other    Family Psychiatric History: See H&P Social History:  Social History   Substance and Sexual Activity  Alcohol Use Not Currently     Social History   Substance and Sexual Activity  Drug Use Not Currently   Types: Marijuana, MDMA (Ecstacy), Cocaine   Comment: Coc-last use 2/27; marijuana and MDMA last use 2/28    Social History   Socioeconomic History   Marital status: Single    Spouse name: Not on file   Number of children: 2   Years of education: Not on file   Highest education level: Some college, no degree  Occupational History   Not on file  Tobacco Use   Smoking status: Every Day    Packs/day: 0.50    Years: 4.00    Additional pack years: 0.00  Total pack years: 2.00    Types: Cigarettes, Cigars    Passive exposure: Past   Smokeless tobacco: Never   Tobacco comments:    black and mild with THC  Vaping Use   Vaping Use: Never used  Substance and Sexual Activity   Alcohol use: Not Currently   Drug use: Not Currently    Types: Marijuana, MDMA (Ecstacy), Cocaine    Comment: Coc-last use 2/27; marijuana and MDMA last use 2/28   Sexual activity: Yes    Partners: Male     Birth control/protection: None  Other Topics Concern   Not on file  Social History Narrative   Pt and family are homeless. She, her wife and two kids live in her cousin's house         04/15/22 pt reported to this Probation officer that she lives with her grandpa and her dad, and her grandmother recently died.   Social Determinants of Health   Financial Resource Strain: Not on file  Food Insecurity: Patient Declined (04/15/2022)   Hunger Vital Sign    Worried About Running Out of Food in the Last Year: Patient declined    [place] in the Last Year: Patient declined  Transportation Needs: Patient Declined (04/15/2022)   [place] - Hydrologist (Medical): Patient declined    Lack of Transportation (Non-Medical): Patient declined  Physical Activity: Not on file  Stress: Not on file  Social Connections: Not on file   Additional Social History:                         Current Medications: Current Facility-Administered Medications  Medication Dose Route Frequency Provider Last Rate Last Admin   acetaminophen (TYLENOL) tablet 650 mg  650 mg Oral Q6H PRN Rosezetta Schlatter, MD       alum & mag hydroxide-simeth (MAALOX/MYLANTA) 200-200-20 MG/5ML suspension 30 mL  30 mL Oral Q4H PRN Rosezetta Schlatter, MD       ARIPiprazole (ABILIFY) tablet 5 mg  5 mg Oral Daily Massengill, Nathan, MD   5 mg at 04/18/22 0732   diphenhydrAMINE (BENADRYL) capsule 50 mg  50 mg Oral TID PRN Rosezetta Schlatter, MD       Or   diphenhydrAMINE (BENADRYL) injection 50 mg  50 mg Intramuscular TID PRN Rosezetta Schlatter, MD       escitalopram (LEXAPRO) tablet 10 mg  10 mg Oral Daily Massengill, Ovid Curd, MD   10 mg at 04/18/22 0731   haloperidol (HALDOL) tablet 5 mg  5 mg Oral TID PRN Rosezetta Schlatter, MD       Or   haloperidol lactate (HALDOL) injection 5 mg  5 mg Intramuscular TID PRN Rosezetta Schlatter, MD       hydrOXYzine (ATARAX) tablet 25 mg  25 mg Oral TID PRN Rosezetta Schlatter, MD   25 mg at  04/15/22 2123   LORazepam (ATIVAN) tablet 2 mg  2 mg Oral TID PRN Rosezetta Schlatter, MD       Or   LORazepam (ATIVAN) injection 2 mg  2 mg Intramuscular TID PRN Rosezetta Schlatter, MD       magnesium hydroxide (MILK OF MAGNESIA) suspension 30 mL  30 mL Oral Daily PRN Rosezetta Schlatter, MD       nicotine polacrilex (NICORETTE) gum 2 mg  2 mg Oral PRN Massengill, Nathan, MD       prazosin (MINIPRESS) capsule 1 mg  1 mg Oral QHS Janine Limbo, MD  1 mg at 04/17/22 2111   traZODone (DESYREL) tablet 50 mg  50 mg Oral QHS PRN Rosezetta Schlatter, MD   50 mg at 04/17/22 2111    Lab Results:  No results found for this or any previous visit (from the past 48 hour(s)).  Blood Alcohol level:  Lab Results  Component Value Date   ETH <10 06/03/2021   ETH <10 [id number]    Metabolic Disorder Labs: Lab Results  Component Value Date   HGBA1C 5.4 04/13/2022   MPG 108 04/13/2022   MPG 108.28 09/16/2020   Lab Results  Component Value Date   PROLACTIN 6.6 12/28/2018   PROLACTIN 5.8 08/23/2017   Lab Results  Component Value Date   CHOL 176 04/13/2022   TRIG 86 04/13/2022   HDL 45 04/13/2022   CHOLHDL 3.9 04/13/2022   VLDL 17 04/13/2022   LDLCALC 114 (H) 04/13/2022   LDLCALC 112 (H) 09/17/2020    Physical Findings: BP 127/85 (BP Location: Right Arm)   Pulse 98   Temp 98.5 F (36.9 C)   Resp 14   Ht 5\' 9"  (1.753 m)   Wt 97.3 kg   SpO2 100%   BMI 31.69 kg/m   Physical Exam Physical Exam Vitals and nursing note reviewed.  Constitutional:      General: She is awake. She is not in acute distress.    Appearance: She is not ill-appearing, toxic-appearing or diaphoretic.  HENT:     Head: Normocephalic and atraumatic.     Nose: No congestion.  Pulmonary:     Effort: Pulmonary effort is normal. No respiratory distress.  Neurological:     General: No focal deficit present.     Mental Status: She is alert and oriented to person, place, and time.     AIMS:   ,  ,  ,  ,       Psychiatric Specialty Exam: Presentation  General Appearance: Appropriate for Environment; Casual; Fairly Groomed  Eye Contact: Fair  Speech: Clear and Coherent; Normal Rate  Volume: Normal  Handedness:Right   Mood and Affect  Mood: Anxious; Dysphoric; Irritable  Affect: Appropriate; Congruent; Constricted   Thought Process  Thought Process: Goal Directed  Descriptions of Associations: Intact   Thought Content Suicidal Thoughts:No -- (.) -- (.)  Homicidal Thoughts: Denied Hallucinations: Denied AVH Ideas of Reference:None  Thought Content:Rumination; Perseveration   Sensorium  Memory:Immediate Fair; Recent Fair  Judgment:Impaired  Insight:Shallow   Executive Functions  Orientation:Full (Time, Place and Person)  Language:Good  Concentration:Good  Attention:Good  [place] of Knowledge:Good   Psychomotor Activity  Psychomotor Activity:Increased; Restlessness   Assets  Assets:Communication Skills; Desire for Improvement; Leisure Time; Resilience; Talents/Skills; Housing; Social Support   Sleep  Quality:Good  Documented sleep last 24 hours: 7   ASSESSMENT/PLAN: Diagnoses / Active Problems: Principal Problem:   Severe bipolar I disorder, most recent episode depressed ([place]) Active Problems:   Borderline personality disorder ([place])   Generalized anxiety disorder   Hx of suicide attempt   PTSD (post-traumatic stress disorder)  Patient reported that her depression and anxiety are much improved, however this is incongruent with her affect and overall appearance.  When told that she cannot go home today, she became mildly irritable.  Still having vaginal discharge, waiting on labs per below.  Safety and Monitoring: Voluntary admission to inpatient psychiatric unit for safety, stabilization and treatment   Daily contact with patient to assess and evaluate symptoms and progress in treatment Patient's case to be discussed in  multi-disciplinary team meeting Observation Level: q15 minute checks  Vital signs: q12 hours Precautions: suicide, elopement, and assault  2. Psychiatric Diagnoses and Treatment:  Continued Abilify 5 mg once daily.  For bipolar disorder Continued Lexapro 10 mg once daily.  For GAD and PTSD Hold lamotrigine Hold buspirone as patient does not believe this was efficacious Continued prazosin 1 mg nightly for PTSD related nightmares Continued trazodone 50 mg nightly as needed for insomnia   3. Medical Issues Being Addressed:  Vaginal discharge  Health maintenance Malodorous, yellow.  Denied pruritus, pain on urination. STI panel collected RPR collected HIV collected Hepatitis panel collected  4. Discharge Planning:  Social work and case management to assist with discharge planning and identification of hospital follow-up needs prior to discharge Estimated LOS: 5-7 days Date: TBA  Barrier: Med management, SI Location: TBA  Discharge Concerns: Need to establish a safety plan; Medication compliance and effectiveness Discharge Goals: Return home with outpatient referrals for mental health follow-up including medication management/psychotherapy   Treatment Plan Summary: Daily contact with patient to assess and evaluate symptoms and progress in treatment and Medication management  Total Time spent with patient: See attending attestation Patient's case was discussed with Attending Dr. Winfred Leeds  Signed: Merrily Brittle, DO Psychiatry Resident, PGY-2 [facility] - [facility]  [address] [place], [place] [address] Ph: [phone] Fax: [phone] 04/18/2022, 11:39 AM

## 2022-04-18 NOTE — Progress Notes (Signed)
Pt compliant with medications and treatment. Pt attending group. Pt reports improvement of mood, states she feels "calm" today. Pt denies SI/Hi/self harm thoughts as well as a/v hallucinations. Q 15 minute checks ongoing for safety.

## 2022-04-18 NOTE — Group Note (Signed)
Date:  04/18/2022 Time:  9:40 AM  Group Topic/Focus:  Orientation:   The focus of this group is to educate the patient on the purpose and policies of crisis stabilization and provide a format to answer questions about their admission.  The group details unit policies and expectations of patients while admitted.    Participation Level:  Active  Participation Quality:  Appropriate  Affect:  Appropriate  Cognitive:  Appropriate  Insight: Appropriate  Engagement in Group:  Engaged  Modes of Intervention:  Discussion  Additional Comments:     [name] 04/18/2022, 9:40 AM

## 2022-04-18 NOTE — Progress Notes (Signed)
   04/17/22 2200  Psych Admission Type (Psych Patients Only)  Admission Status Voluntary  Psychosocial Assessment  Patient Complaints None  Eye Contact Fair  Facial Expression Animated  Affect Appropriate to circumstance  Speech Logical/coherent  Interaction Assertive  Motor Activity Slow  Appearance/Hygiene Unremarkable  Behavior Characteristics Cooperative  Mood Pleasant  Thought Process  Coherency WDL  Content WDL  Delusions None reported or observed  Perception WDL  Hallucination None reported or observed  Judgment Poor  Confusion None  Danger to Self  Current suicidal ideation? Denies  Agreement Not to Harm Self Yes  Description of Agreement verbal  Danger to Others  Danger to Others None reported or observed

## 2022-04-18 NOTE — Group Note (Signed)
Date:  04/18/2022 Time:  5:18 PM  Group Topic/Focus:  Spirituality:   The focus of this group is to discuss how one's spirituality can aide in recovery.    Participation Level:  Active  Participation Quality:  Appropriate  Affect:  Appropriate  Cognitive:  Appropriate  Insight: Appropriate  Engagement in Group:  Engaged  Modes of Intervention:  Exploration  Additional Comments:     [name] 04/18/2022, 5:18 PM

## 2022-04-18 NOTE — Group Note (Signed)
Date:  04/18/2022 Time:  9:42 AM  Group Topic/Focus:  Dimensions of Wellness:   The focus of this group is to introduce the topic of wellness and discuss the role each dimension of wellness plays in total health.    Participation Level:  Active  Participation Quality:  Appropriate  Affect:  Appropriate  Cognitive:  Appropriate  Insight: Appropriate  Engagement in Group:  Engaged  Modes of Intervention:  Education  Additional Comments:      [name] 04/18/2022, 9:42 AM

## 2022-04-18 NOTE — Plan of Care (Signed)
  Problem: Coping: Goal: Coping ability will improve Outcome: Progressing   

## 2022-04-18 NOTE — [facility] Group Notes (Signed)
[facility] Group Notes:  (Nursing/MHT/Case Management/Adjunct)  Date:  04/18/2022  Time:  9:18 PM  Type of Therapy:  Group Therapy  Participation Level:  Active  Participation Quality:  Appropriate  Affect:  Appropriate  Cognitive:  Appropriate  Insight:  Appropriate  Engagement in Group:  Engaged  Modes of Intervention:  Education  Summary of Progress/Problems: Goal to work on D/C plan. Day 9/10.  [name] 04/18/2022, 9:18 PM

## 2022-04-19 DIAGNOSIS — F315 Bipolar disorder, current episode depressed, severe, with psychotic features: Secondary | ICD-10-CM | POA: Diagnosis not present

## 2022-04-19 LAB — RPR: RPR Ser Ql: NONREACTIVE

## 2022-04-19 MED ORDER — HYDROXYZINE HCL 10 MG PO TABS: 10.0000 mg | ORAL_TABLET | Freq: Three times a day (TID) | ORAL | Status: DC | PRN

## 2022-04-19 MED ORDER — ONDANSETRON 4 MG PO TBDP: 4.0000 mg | ORAL_TABLET | Freq: Three times a day (TID) | ORAL | Status: DC | PRN

## 2022-04-19 NOTE — Group Note (Signed)
Date:  04/19/2022 Time:  11:03 AM  Group Topic/Focus:  Orientation:   The focus of this group is to educate the patient on the purpose and policies of crisis stabilization and provide a format to answer questions about their admission.  The group details unit policies and expectations of patients while admitted.    Participation Level:  Active  Participation Quality:  Appropriate  Affect:  Appropriate  Cognitive:  Appropriate  Insight: Appropriate  Engagement in Group:  Engaged  Modes of Intervention:  Discussion  Additional Comments:     [name] 04/19/2022, 11:03 AM

## 2022-04-19 NOTE — Group Note (Signed)
Occupational Therapy Group Note   Group Topic:Goal Setting  Group Date: 04/19/2022 Start Time: 1430 End Time: 1500 Facilitators: Brantley Stage, OT   Group Description: Group encouraged engagement and participation through discussion focused on goal setting. Group members were introduced to goal-setting using the SMART Goal framework, identifying goals as Specific, Measureable, Acheivable, Relevant, and Time-Bound. Group members took time from group to create their own personal goal reflecting the SMART goal template and shared for review by peers and OT.    Therapeutic Goal(s):  Identify at least one goal that fits the SMART framework    Participation Level: Engaged   Participation Quality: Independent   Behavior: Appropriate   Speech/Thought Process: Relevant   Affect/Mood: Appropriate   Insight: Fair   Judgement: Fair   Individualization: pt was new skills were in their participation of group discussion/activity. New skills were identified  Modes of Intervention: Education  Patient Response to Interventions:  Attentive   Plan: Continue to engage patient in OT groups 2 - 3x/week.  04/19/2022  Brantley Stage, OT Cornell Barman, OT

## 2022-04-19 NOTE — Progress Notes (Signed)
Pt denied SI/HI/AVH this morning. Pt has been pleasant, calm, and cooperative throughout the shift. RN provided support and encouragement to patient. Pt given scheduled medications as prescribed. Q15 min checks verified for safety. Patient verbally contracts for safety. Patient compliant with medications and treatment plan. Patient is interacting well on the unit. Pt is safe on the unit.   04/19/22 0900  Psych Admission Type (Psych Patients Only)  Admission Status Voluntary  Psychosocial Assessment  Patient Complaints None  Eye Contact Fair  Facial Expression Flat;Sad  Affect Appropriate to circumstance  Speech Logical/coherent  Interaction Assertive  Motor Activity Slow  Appearance/Hygiene Unremarkable  Behavior Characteristics Cooperative;Appropriate to situation  Mood Depressed  Thought Process  Coherency WDL  Content WDL  Delusions None reported or observed  Perception WDL  Hallucination None reported or observed  Judgment Impaired  Confusion None  Danger to Self  Current suicidal ideation? Denies  Description of Suicide Plan No plan  Agreement Not to Harm Self Yes  Description of Agreement Pt verbally contracts for safety  Danger to Others  Danger to Others None reported or observed

## 2022-04-19 NOTE — Progress Notes (Addendum)
[facility] MD Progress Note  04/19/2022 11:47 AM [name]  MRN:  [id number]  Subjective:   [name] is a 22 y.o. female with a reported psychiatric history of borderline personality disorder, bipolar disorder, PTSD, GAD, who was admitted to the psychiatric unit for worsening depression, suicidal thoughts, and command auditory hallucinations to harm herself.    Yesterday the psychiatry team made the following recommendations: Continued Abilify 5 mg once daily.  For bipolar disorder Continued Lexapro 10 mg once daily.  For GAD and PTSD Continued prazosin 1 mg nightly for PTSD related nightmares Continued trazodone 50 mg nightly as needed for insomnia  On assessment today, the pt reports that their mood is less depressed.  Per nursing patient appears to be minimizing symptoms (patient on multiple meds reports -i.e. rating anxiety level low but visibly very anxious).  Reports that anxiety is rated at 4 to 7 out of 10.  She reports that as needed hydroxyzine 25 mg is too sedating.  We discussed we will decrease the dose for this to see if it is efficacious without being too sedating during the day. Sleep is better. Appetite is better. Concentration is at baseline. Energy level is improving. Denies having any suicidal thoughts.   Denies having any HI.  Denies having psychotic symptoms.   Reports feeling nauseous this morning, on sure why.  Will order Zofran. She otherwise denies having other side effects to current psychiatric medications.   We discussed that patient has not had any friends or family visits since admission.  I encouraged patient to offer imitation for one of her parents to visit.  We discussed that after the visit tonight, we will I will follow-up with the visitor, to inquire about the patient's response to treatment of psychiatric symptoms and if patient's symptoms have returned to baseline, in order to assess for discharge appropriateness.     Principal Problem: Severe  bipolar I disorder, most recent episode depressed ([place]) Diagnosis: Principal Problem:   Severe bipolar I disorder, most recent episode depressed ([place]) Active Problems:   Borderline personality disorder (HCC)   Generalized anxiety disorder   Hx of suicide attempt   PTSD (post-traumatic stress disorder)  Total Time spent with patient: 15 minutes  Past Psychiatric History:  Diagnosis of borderline personality disorder, bipolar disorder, PTSD, GAD. Reports a history of 6-7 overdose attempts in the past. Reports history of multiple psychiatric hospitalizations. Reports not taking psychiatric medications for about 1 month. Reports history of other psychiatric medication trials "a lot of medications that are more than all. Prozac made her more depressed. Geodon made me gain weight.".   Past Medical History:  Past Medical History:  Diagnosis Date   Anxiety    Asthma    Headache(784.0)    Hx of suicide attempt    Major depressive disorder    Morbid obesity ([place]) 03/25/2020   PTSD (post-traumatic stress disorder)     Past Surgical History:  Procedure Laterality Date   NO PAST SURGERIES     wisdom tooth extraction     Family History:  Family History  Problem Relation Age of Onset   Diabetes Maternal Aunt    Diabetes Maternal Grandmother    Cancer Maternal Grandmother    Breast cancer Maternal Grandmother    Breast cancer Other    Family Psychiatric  History: See H&P  Social History:  Social History   Substance and Sexual Activity  Alcohol Use Not Currently     Social History   Substance and Sexual  Activity  Drug Use Not Currently   Types: Marijuana, MDMA (Ecstacy), Cocaine   Comment: Coc-last use 2/27; marijuana and MDMA last use 2/28    Social History   Socioeconomic History   Marital status: Single    Spouse name: Not on file   Number of children: 2   Years of education: Not on file   Highest education level: Some college, no degree  Occupational History   Not on  file  Tobacco Use   Smoking status: Every Day    Packs/day: 0.50    Years: 4.00    Additional pack years: 0.00    Total pack years: 2.00    Types: Cigarettes, Cigars    Passive exposure: Past   Smokeless tobacco: Never   Tobacco comments:    black and mild with THC  Vaping Use   Vaping Use: Never used  Substance and Sexual Activity   Alcohol use: Not Currently   Drug use: Not Currently    Types: Marijuana, MDMA (Ecstacy), Cocaine    Comment: Coc-last use 2/27; marijuana and MDMA last use 2/28   Sexual activity: Yes    Partners: Male    Birth control/protection: None  Other Topics Concern   Not on file  Social History Narrative   Pt and family are homeless. She, her wife and two kids live in her cousin's house         04/15/22 pt reported to this Probation officer that she lives with her grandpa and her dad, and her grandmother recently died.   Social Determinants of Health   Financial Resource Strain: Not on file  Food Insecurity: Patient Declined (04/15/2022)   Hunger Vital Sign    Worried About Running Out of Food in the Last Year: Patient declined    [place] in the Last Year: Patient declined  Transportation Needs: Patient Declined (04/15/2022)   [place] - Hydrologist (Medical): Patient declined    Lack of Transportation (Non-Medical): Patient declined  Physical Activity: Not on file  Stress: Not on file  Social Connections: Not on file   Additional Social History:                           Current Medications: Current Facility-Administered Medications  Medication Dose Route Frequency Provider Last Rate Last Admin   acetaminophen (TYLENOL) tablet 650 mg  650 mg Oral Q6H PRN Rosezetta Schlatter, MD       alum & mag hydroxide-simeth (MAALOX/MYLANTA) 200-200-20 MG/5ML suspension 30 mL  30 mL Oral Q4H PRN Rosezetta Schlatter, MD       ARIPiprazole (ABILIFY) tablet 5 mg  5 mg Oral Daily Waver Dibiasio, MD   5 mg at 04/19/22 0813    diphenhydrAMINE (BENADRYL) capsule 50 mg  50 mg Oral TID PRN Rosezetta Schlatter, MD       Or   diphenhydrAMINE (BENADRYL) injection 50 mg  50 mg Intramuscular TID PRN Rosezetta Schlatter, MD       escitalopram (LEXAPRO) tablet 10 mg  10 mg Oral Daily Bufford Helms, MD   10 mg at 04/19/22 0813   haloperidol (HALDOL) tablet 5 mg  5 mg Oral TID PRN Rosezetta Schlatter, MD       Or   haloperidol lactate (HALDOL) injection 5 mg  5 mg Intramuscular TID PRN Rosezetta Schlatter, MD       hydrOXYzine (ATARAX) tablet 10 mg  10 mg Oral TID PRN Jazalynn Mireles, Ovid Curd,  MD       LORazepam (ATIVAN) tablet 2 mg  2 mg Oral TID PRN Rosezetta Schlatter, MD       Or   LORazepam (ATIVAN) injection 2 mg  2 mg Intramuscular TID PRN Rosezetta Schlatter, MD       magnesium hydroxide (MILK OF MAGNESIA) suspension 30 mL  30 mL Oral Daily PRN Rosezetta Schlatter, MD       nicotine polacrilex (NICORETTE) gum 2 mg  2 mg Oral PRN Luisana Lutzke, Ovid Curd, MD       ondansetron (ZOFRAN-ODT) disintegrating tablet 4 mg  4 mg Oral Q8H PRN [place] Wanner, Ovid Curd, MD       prazosin (MINIPRESS) capsule 1 mg  1 mg Oral QHS Cloud Graham, Ovid Curd, MD   1 mg at 04/18/22 2129   traZODone (DESYREL) tablet 50 mg  50 mg Oral QHS PRN Rosezetta Schlatter, MD   50 mg at 04/18/22 2130    Lab Results:  Results for orders placed or performed during the hospital encounter of 04/15/22 (from the past 48 hour(s))  Hepatitis panel, acute     Status: None   Collection Time: 04/18/22  6:26 AM  Result Value Ref Range   Hepatitis B Surface Ag NON REACTIVE NON REACTIVE   HCV Ab NON REACTIVE NON REACTIVE    Comment: (NOTE) Nonreactive HCV antibody screen is consistent with no HCV infections,  unless recent infection is suspected or other evidence exists to indicate HCV infection.     Hep A IgM NON REACTIVE NON REACTIVE   Hep B C IgM NON REACTIVE NON REACTIVE    Comment: Performed at [facility], [place] [address], [place], [place] [address]  HIV Antibody (routine testing w rflx)      Status: None   Collection Time: 04/18/22  6:26 AM  Result Value Ref Range   HIV Screen 4th Generation wRfx Non Reactive Non Reactive    Comment: Performed at [facility], [place] [address], [place], [place] [address]  RPR     Status: None   Collection Time: 04/18/22  6:26 AM  Result Value Ref Range   RPR Ser Ql NON REACTIVE NON REACTIVE    Comment: Performed at [facility], [place] [address] [place], [place] [address]    Blood Alcohol level:  Lab Results  Component Value Date   ETH <10 06/03/2021   ETH <10 [id number]    Metabolic Disorder Labs: Lab Results  Component Value Date   HGBA1C 5.4 04/13/2022   MPG 108 04/13/2022   MPG 108.28 09/16/2020   Lab Results  Component Value Date   PROLACTIN 6.6 12/28/2018   PROLACTIN 5.8 08/23/2017   Lab Results  Component Value Date   CHOL 176 04/13/2022   TRIG 86 04/13/2022   HDL 45 04/13/2022   CHOLHDL 3.9 04/13/2022   VLDL 17 04/13/2022   LDLCALC 114 (H) 04/13/2022   LDLCALC 112 (H) 09/17/2020    Physical Findings: AIMS:  , ,  ,  ,    CIWA:    COWS:     Musculoskeletal: Strength & Muscle Tone: Lying in bed Gait & Station: Lying in bed Patient leans: Lying in bed  Psychiatric Specialty Exam:  Presentation  General Appearance:  Casual  Eye Contact: Good  Speech: Normal Rate  Speech Volume: Normal  Handedness: Right   Mood and Affect  Mood: Anxious  Affect: Congruent   Thought Process  Thought Processes: Linear  Descriptions of Associations:Intact  Orientation:Full (Time, Place  and Person)  Thought Content:Logical  History of Schizophrenia/Schizoaffective disorder:No  Duration of Psychotic Symptoms:N/A  Hallucinations:Hallucinations: None Description of Auditory Hallucinations: Denied AH  Ideas of Reference:None  Suicidal Thoughts:Suicidal Thoughts: No SI Active Intent and/or Plan: -- (.) SI Passive Intent and/or Plan: -- (.)  Homicidal Thoughts:Homicidal Thoughts:  No   Sensorium  Memory: Immediate Good; Recent Good; Remote Good  Judgment: Fair  Insight: Fair   Community education officer  Concentration: Fair  Attention Span: Fair  Recall: Good  Fund of Knowledge: Good  Language: Good   Psychomotor Activity  Psychomotor Activity: Psychomotor Activity: Normal   Assets  Assets: Communication Skills; Desire for Improvement; Leisure Time; Resilience; Talents/Skills; Housing; Social Support   Sleep  Sleep: Sleep: Fair    Physical Exam: Physical Exam Vitals reviewed.  Constitutional:      General: She is not in acute distress.    Appearance: She is not toxic-appearing.  Pulmonary:     Effort: Pulmonary effort is normal.  Neurological:     Mental Status: She is alert.    Review of Systems  Constitutional:  Negative for chills and fever.  Cardiovascular:  Negative for chest pain and palpitations.  Gastrointestinal:  Positive for nausea.  Neurological:  Negative for dizziness, tingling, tremors and headaches.  Psychiatric/Behavioral:  Negative for suicidal ideas. The patient is nervous/anxious.    Blood pressure 119/79, pulse (!) 110, temperature 98.2 F (36.8 C), temperature source Oral, resp. rate 14, height 5\' 9"  (1.753 m), weight 97.3 kg, SpO2 100 %. Body mass index is 31.69 kg/m.   Treatment Plan Summary: Daily contact with patient to assess and evaluate symptoms and progress in treatment and Medication management   ASSESSMENT:   Diagnoses / Active Problems: Bipolar disorder, current episode is depressed, type unclear possibly type II GAD PTSD Borderline personality disorder     PLAN: Safety and Monitoring:             --  Voluntary admission to inpatient psychiatric unit for safety, stabilization and treatment             -- Daily contact with patient to assess and evaluate symptoms and progress in treatment             -- Patient's case to be discussed in multi-disciplinary team meeting              -- Observation Level : q15 minute checks             -- Vital signs:  q12 hours             -- Precautions: suicide, elopement, and assault  2. Psychiatric Diagnoses and Treatment:  Continue Abilify 5 mg once daily.  For bipolar disorder Continue Lexapro 10 mg once daily.  For GAD and PTSD Continued prazosin 1 mg nightly for PTSD related nightmares Continued trazodone 50 mg nightly as needed for insomnia Decrease hydroxyzine from 25 mg to 10 mg as needed for anxiety.  Decrease dose due to sedation.   Hold lamotrigine Hold buspirone as patient does not believe this was efficacious  3. Medical Issues Being Addressed:  Vaginal discharge  Health maintenance Malodorous, yellow.  Denied pruritus, pain on urination. STI panel collected RPR collected HIV collected Hepatitis panel collected   4. Discharge Planning:  Social work and case management to assist with discharge planning and identification of hospital follow-up needs prior to discharge Estimated LOS: 1-2 more days    Discharge Concerns: Need to establish a safety plan; Medication compliance  and effectiveness Discharge Goals: Return home with outpatient referrals for mental health follow-up including medication management/psychotherapy   Christoper Allegra, MD 04/19/2022, 11:47 AM  Total Time Spent in Direct Patient Care:  I personally spent 35 minutes on the unit in direct patient care. The direct patient care time included face-to-face time with the patient, reviewing the patient's chart, communicating with other professionals, and coordinating care. Greater than 50% of this time was spent in counseling or coordinating care with the patient regarding goals of hospitalization, psycho-education, and discharge planning needs.   Janine Limbo, MD Psychiatrist

## 2022-04-19 NOTE — [facility] Group Notes (Signed)
Pt attended [place] group. Pt was engaged and participated appropriately.

## 2022-04-19 NOTE — Progress Notes (Signed)
   04/19/22 2012  Psych Admission Type (Psych Patients Only)  Admission Status Voluntary  Psychosocial Assessment  Patient Complaints None  Eye Contact Fair  Facial Expression Animated  Affect Appropriate to circumstance  Speech Logical/coherent  Interaction Assertive  Motor Activity Slow  Appearance/Hygiene Unremarkable  Behavior Characteristics Cooperative  Mood Depressed  Thought Process  Coherency WDL  Content WDL  Delusions None reported or observed  Perception WDL  Hallucination None reported or observed  Judgment Impaired  Confusion None  Danger to Self  Current suicidal ideation? Denies  Agreement Not to Harm Self Yes  Description of Agreement verbal  Danger to Others  Danger to Others None reported or observed   Alert/oriented. Makes needs/concerns known to staff. Pleasant cooperative with staff. Denies SI/HI/A/V hallucinations. Med compliant. Patient states went to group. Will encourage continued compliance and progression towards goals. Verbally contracted for safety. Will continue to monitor.

## 2022-04-20 DIAGNOSIS — F315 Bipolar disorder, current episode depressed, severe, with psychotic features: Secondary | ICD-10-CM

## 2022-04-20 MED ORDER — NICOTINE POLACRILEX 2 MG MT GUM: 2.0000 mg | CHEWING_GUM | OROMUCOSAL | 0 refills | Status: DC | PRN

## 2022-04-20 MED ORDER — ESCITALOPRAM OXALATE 10 MG PO TABS: 10.0000 mg | ORAL_TABLET | Freq: Every day | ORAL | 0 refills | Status: DC

## 2022-04-20 MED ORDER — TRAZODONE HCL 50 MG PO TABS: 50.0000 mg | ORAL_TABLET | Freq: Every evening | ORAL | 0 refills | Status: DC | PRN

## 2022-04-20 MED ORDER — HYDROXYZINE HCL 10 MG PO TABS: 10.0000 mg | ORAL_TABLET | Freq: Three times a day (TID) | ORAL | 0 refills | Status: DC | PRN

## 2022-04-20 MED ORDER — PRAZOSIN HCL 1 MG PO CAPS: 1.0000 mg | ORAL_CAPSULE | Freq: Every day | ORAL | 0 refills | Status: DC

## 2022-04-20 MED ORDER — ARIPIPRAZOLE 5 MG PO TABS: 5.0000 mg | ORAL_TABLET | Freq: Every day | ORAL | 0 refills | Status: DC

## 2022-04-20 NOTE — Progress Notes (Signed)
[name] D/C'd Home per MD order.  Discussed with the patient and all questions fully answered.   An After Visit Summary was printed and given to the patient. Patient received prescription.  D/c education completed with patient including follow up instructions, medication list, d/c activities limitations if indicated, with other d/c instructions as indicated by MD - patient able to verbalize understanding, all questions fully answered. Patient denies SI/HI/AVH at D/C, and a copy of suicide risk assessment was given to her.  Patient instructed to return to ED, call 911, or call MD for any changes in condition.   Patient escorted to the main entrance, and D/C home via private auto.  [name] O [name] 04/20/2022 1:30 PM

## 2022-04-20 NOTE — Discharge Instructions (Signed)
-  Follow-up with your outpatient psychiatric provider -instructions on appointment date, time, and address (location) are provided to you in discharge paperwork.  -Take your psychiatric medications as prescribed at discharge - instructions are provided to you in the discharge paperwork  -Follow-up with outpatient primary care doctor and other specialists -for management of preventative medicine and any chronic medical disease.  -Recommend abstinence from alcohol, tobacco, and other illicit drug use at discharge.   -If your psychiatric symptoms recur, worsen, or if you have side effects to your psychiatric medications, call your outpatient psychiatric provider, 911, [phone] or go to the nearest emergency department.  -If suicidal thoughts occur, call your outpatient psychiatric provider, 911, [phone] or go to the nearest emergency department.  Naloxone (Narcan) can help reverse an overdose when given to the victim quickly.  [place] offers free naloxone kits and instructions/training on its use.  Add naloxone to your first aid kit and you can help save a life.   Pick up your free kit at the following locations:   [place]:  [facility], [address] [place] [address] ([phone]) [facility] [address] [place] [id number] ([phone]) [facility] [address] [place] [address]  [place]: [facility] [address] [place] [address] ([phone]) [facility] [address] [place] [address] ([phone])  

## 2022-04-20 NOTE — Group Note (Signed)
Date:  04/20/2022 Time:  10:27 AM  Group Topic/Focus:  Goals Group:   The focus of this group is to help patients establish daily goals to achieve during treatment and discuss how the patient can incorporate goal setting into their daily lives to aide in recovery. Orientation:   The focus of this group is to educate the patient on the purpose and policies of crisis stabilization and provide a format to answer questions about their admission.  The group details unit policies and expectations of patients while admitted.    Participation Level:  Active  Participation Quality:  Appropriate  Affect:  Appropriate  Cognitive:  Appropriate  Insight: Appropriate  Engagement in Group:  Engaged  Modes of Intervention:  Discussion and Education  Additional Comments:   Pt attended and actively participated in the Orientation/Goals group. Pt personal goal is to successfully discharge from treatment today.  [name] [name] 04/20/2022, 10:27 AM

## 2022-04-20 NOTE — Discharge Summary (Signed)
Physician Discharge Summary Note  Patient:  [name] is an 22 y.o., female MRN:  [id number] DOB:  23-Jun-2000 Patient phone:  [phone] (home)  Patient address:   [address] [place] [address],  Total Time spent with patient: 15 minutes  Date of Admission:  04/15/2022 Date of Discharge: 04-20-2022  Reason for Admission:    Patient is a 22 year old female with a reported psychiatric history of borderline personality disorder, bipolar disorder, PTSD, GAD, who was admitted to the psychiatric unit for worsening depression, suicidal thoughts, and command auditory hallucinations to harm herself.   Principal Problem: Severe bipolar I disorder, most recent episode depressed [facility]) Discharge Diagnoses: Principal Problem:   Severe bipolar I disorder, most recent episode depressed ([place]) Active Problems:   Borderline personality disorder ([place])   Generalized anxiety disorder   Hx of suicide attempt   PTSD (post-traumatic stress disorder)   Past Psychiatric History:  Diagnosis of borderline personality disorder, bipolar disorder, PTSD, GAD. Reports a history of 6-7 overdose attempts in the past. Reports history of multiple psychiatric hospitalizations. Reports not taking psychiatric medications for about 1 month. Reports history of other psychiatric medication trials "a lot of medications that are more than all. Prozac made her more depressed. Geodon made me gain weight.".   Past Medical History:  Past Medical History:  Diagnosis Date   Anxiety    Asthma    Headache(784.0)    Hx of suicide attempt    Major depressive disorder    Morbid obesity ([place]) 03/25/2020   PTSD (post-traumatic stress disorder)     Past Surgical History:  Procedure Laterality Date   NO PAST SURGERIES     wisdom tooth extraction     Family History:  Family History  Problem Relation Age of Onset   Diabetes Maternal Aunt    Diabetes Maternal Grandmother    Cancer Maternal Grandmother     Breast cancer Maternal Grandmother    Breast cancer Other    Family Psychiatric  History:  Patient reports her maternal grandmother could have had some kind of psychiatric illness but is unsure. Denies any known family history of suicide attempts.   Social History:  Social History   Substance and Sexual Activity  Alcohol Use Not Currently     Social History   Substance and Sexual Activity  Drug Use Not Currently   Types: Marijuana, MDMA (Ecstacy), Cocaine   Comment: Coc-last use 2/27; marijuana and MDMA last use 2/28    Social History   Socioeconomic History   Marital status: Single    Spouse name: Not on file   Number of children: 2   Years of education: Not on file   Highest education level: Some college, no degree  Occupational History   Not on file  Tobacco Use   Smoking status: Every Day    Packs/day: 0.50    Years: 4.00    Additional pack years: 0.00    Total pack years: 2.00    Types: Cigarettes, Cigars    Passive exposure: Past   Smokeless tobacco: Never   Tobacco comments:    black and mild with THC  Vaping Use   Vaping Use: Never used  Substance and Sexual Activity   Alcohol use: Not Currently   Drug use: Not Currently    Types: Marijuana, MDMA (Ecstacy), Cocaine    Comment: Coc-last use 2/27; marijuana and MDMA last use 2/28   Sexual activity: Yes    Partners: Male    Birth control/protection:  None  Other Topics Concern   Not on file  Social History Narrative   Pt and family are homeless. She, her wife and two kids live in her cousin's house         04/15/22 pt reported to this Probation officer that she lives with her grandpa and her dad, and her grandmother recently died.   Social Determinants of Health   Financial Resource Strain: Not on file  Food Insecurity: Patient Declined (04/15/2022)   Hunger Vital Sign    Worried About Running Out of Food in the Last Year: Patient declined    [place] in the Last Year: Patient declined  Transportation  Needs: Patient Declined (04/15/2022)   PRAPARE - Hydrologist (Medical): Patient declined    Lack of Transportation (Non-Medical): Patient declined  Physical Activity: Not on file  Stress: Not on file  Social Connections: Not on file    [place]:  During the patient's hospitalization, patient had extensive initial psychiatric evaluation, and follow-up psychiatric evaluations every day.  Psychiatric diagnoses provided upon initial assessment:  Bipolar disorder, current episode is depressed, type unclear possibly type II GAD PTSD Borderline personality disorder  Patient's psychiatric medications were adjusted on admission:  Start Abilify 2 mg once daily today, and increase to 5 mg once daily tomorrow.  For bipolar disorder Start Lexapro 5 mg once daily today for 2 doses, then increase to 10 mg once daily.  For GAD and PTSD Hold lamotrigine Hold buspirone as patient does not believe this was efficacious Start prazosin 1 mg nightly for PTSD related nightmares Start trazodone 50 mg nightly as needed for insomnia  During the hospitalization, other adjustments were made to the patient's psychiatric medication regimen:  -abilify incr to 5 mg once daily -lexapro incr to 10 mg once daily -hydroxyzine PRN decr to 10 mg tid prn   Patient's care was discussed during the interdisciplinary team meeting every day during the hospitalization.  The patient had some nausea which resolved. She otherwise denied having other side effects to prescribed psychiatric medication.  Gradually, patient started adjusting to milieu. The patient was evaluated each day by a clinical provider to ascertain response to treatment. Improvement was noted by the patient's report of decreasing symptoms, improved sleep and appetite, affect, medication tolerance, behavior, and participation in unit programming.  Patient was asked each day to complete a self inventory  noting mood, mental status, pain, new symptoms, anxiety and concerns.    Symptoms were reported as significantly decreased or resolved completely by discharge.   On day of discharge, the patient reports that their mood is stable. The patient denied having suicidal thoughts for more than 48 hours prior to discharge.  Patient denies having homicidal thoughts.  Patient denies having auditory hallucinations.  Patient denies any visual hallucinations or other symptoms of psychosis. The patient was motivated to continue taking medication with a goal of continued improvement in mental health.   The patient reports their target psychiatric symptoms of depression, CAH, suicidal thoughts, all responded well to the psychiatric medications, and the patient reports overall benefit other psychiatric hospitalization. Supportive psychotherapy was provided to the patient. The patient also participated in regular group therapy while hospitalized. Coping skills, problem solving as well as relaxation therapies were also part of the unit programming.  Labs were reviewed with the patient, and abnormal results were discussed with the patient.  The patient is able to verbalize their individual safety plan  to this provider.  # It is recommended to the patient to continue psychiatric medications as prescribed, after discharge from the hospital.    # It is recommended to the patient to follow up with your outpatient psychiatric provider and PCP.  # It was discussed with the patient, the impact of alcohol, drugs, tobacco have been there overall psychiatric and medical wellbeing, and total abstinence from substance use was recommended the patient.ed.  # Prescriptions provided or sent directly to preferred pharmacy at discharge. Patient agreeable to plan. Given opportunity to ask questions. Appears to feel comfortable with discharge.    # In the event of worsening symptoms, the patient is instructed to call the crisis  hotline, 911 and or go to the nearest ED for appropriate evaluation and treatment of symptoms. To follow-up with primary care provider for other medical issues, concerns and or health care needs  # Patient was discharged home to care of father, with a plan to follow up as noted below.  I called pt's father Kavia Aseltine [phone] on the day of dc. He visited the previous night and stated that pt had significant improvement, and does not have any concerns about dc to his home. Denies firearms in home. We discussed the pt's diagnosis, hospital course, treatment, response to treatment, and discharge planning. At the end of the call, the caller had no further questions.    Physical Findings: AIMS:  , ,  ,  ,    CIWA:    COWS:     Aims score zero on my exam. No eps on my exam.  Musculoskeletal: Strength & Muscle Tone: within normal limits Gait & Station: normal Patient leans: N/A   Psychiatric Specialty Exam:  Presentation  General Appearance:  Appropriate for Environment; Casual; Fairly Groomed  Eye Contact: Good  Speech: Normal Rate; Clear and Coherent  Speech Volume: Normal  Handedness: Right   Mood and Affect  Mood: Euthymic; Anxious  Affect: Appropriate; Congruent; Full Range   Thought Process  Thought Processes: Linear  Descriptions of Associations:Intact  Orientation:Full (Time, Place and Person)  Thought Content:Logical  History of Schizophrenia/Schizoaffective disorder:No  Duration of Psychotic Symptoms:less than 6 months   Hallucinations:Hallucinations: None  Ideas of Reference:None  Suicidal Thoughts:Suicidal Thoughts: No  Homicidal Thoughts:Homicidal Thoughts: No   Sensorium  Memory: Immediate Good; Recent Good; Remote Good  Judgment: Good  Insight: Good   Executive Functions  Concentration: Good  Attention Span: Good  Recall: Good  Fund of Knowledge: Good  Language: Good   Psychomotor Activity  Psychomotor  Activity: Psychomotor Activity: Normal   Assets  Assets: Communication Skills; Desire for Improvement; Leisure Time; Resilience; Talents/Skills; Housing; Social Support   Sleep  Sleep: Sleep: Fair    Physical Exam: Physical Exam Vitals reviewed.  Constitutional:      General: She is not in acute distress.    Appearance: She is not toxic-appearing.  Pulmonary:     Effort: Pulmonary effort is normal.  Neurological:     Mental Status: She is alert.     Motor: No weakness.     Gait: Gait normal.  Psychiatric:        Mood and Affect: Mood normal.        Behavior: Behavior normal.        Thought Content: Thought content normal.        Judgment: Judgment normal.    Review of Systems  Constitutional:  Negative for chills and fever.  Cardiovascular:  Negative for chest pain and  palpitations.  Neurological:  Negative for dizziness, tingling, tremors and headaches.  Psychiatric/Behavioral:  Positive for substance abuse. Negative for depression, hallucinations, memory loss and suicidal ideas. The patient is nervous/anxious. The patient does not have insomnia.   All other systems reviewed and are negative.  Blood pressure 117/77, pulse (!) 103, temperature 98.2 F (36.8 C), temperature source Oral, resp. rate 18, height 5\' 9"  (1.753 m), weight 97.3 kg, SpO2 100 %. Body mass index is 31.69 kg/m.   Social History   Tobacco Use  Smoking Status Every Day   Packs/day: 0.50   Years: 4.00   Additional pack years: 0.00   Total pack years: 2.00   Types: Cigarettes, Cigars   Passive exposure: Past  Smokeless Tobacco Never  Tobacco Comments   black and mild with THC   Tobacco Cessation:  A prescription for an FDA-approved tobacco cessation medication provided at discharge   Blood Alcohol level:  Lab Results  Component Value Date   ETH <10 06/03/2021   ETH <10 [id number]    Metabolic Disorder Labs:  Lab Results  Component Value Date   HGBA1C 5.4 04/13/2022   MPG 108  04/13/2022   MPG 108.28 09/16/2020   Lab Results  Component Value Date   PROLACTIN 6.6 12/28/2018   PROLACTIN 5.8 08/23/2017   Lab Results  Component Value Date   CHOL 176 04/13/2022   TRIG 86 04/13/2022   HDL 45 04/13/2022   CHOLHDL 3.9 04/13/2022   VLDL 17 04/13/2022   LDLCALC 114 (H) 04/13/2022   LDLCALC 112 (H) 09/17/2020    See Psychiatric Specialty Exam and Suicide Risk Assessment completed by Attending Physician prior to discharge.  Discharge destination:  Home  Is patient on multiple antipsychotic therapies at discharge:  No   Has Patient had three or more failed trials of antipsychotic monotherapy by history:  No  Recommended Plan for Multiple Antipsychotic Therapies: NA  Discharge Instructions     Diet - low sodium heart healthy   Complete by: As directed    Increase activity slowly   Complete by: As directed       Allergies as of 04/20/2022       Reactions   Lactose Intolerance (gi) Nausea And Vomiting, Other (See Comments)   Tape Other (See Comments)   Slight skin irritation        Medication List     TAKE these medications      Indication  ARIPiprazole 5 MG tablet Commonly known as: ABILIFY Take 1 tablet (5 mg total) by mouth daily. Start taking on: April 21, 2022  Indication: MIXED BIPOLAR AFFECTIVE DISORDER   escitalopram 10 MG tablet Commonly known as: LEXAPRO Take 1 tablet (10 mg total) by mouth daily. Start taking on: April 21, 2022  Indication: Generalized Anxiety Disorder   hydrOXYzine 10 MG tablet Commonly known as: ATARAX Take 1 tablet (10 mg total) by mouth 3 (three) times daily as needed for anxiety. What changed:  medication strength how much to take  Indication: Feeling Anxious   nicotine polacrilex 2 MG gum Commonly known as: NICORETTE Take 1 each (2 mg total) by mouth as needed for smoking cessation.  Indication: Nicotine Addiction   prazosin 1 MG capsule Commonly known as: MINIPRESS Take 1 capsule (1 mg  total) by mouth at bedtime.  Indication: Frightening Dreams   traZODone 50 MG tablet Commonly known as: DESYREL Take 1 tablet (50 mg total) by mouth at bedtime as needed for sleep.  Indication: Trouble Sleeping  [place], [place]. Go on 04/29/2022.   Why: You have an assessment appointment on 04/29/22 at 11:00 am, to obtain [facility] and therapy services.  This appointment will be held in person. Contact information: [place] [address] [place]. Go on 05/11/2022.   Why: You have an appointment for medication management services on 05/11/22 at 3:00 pm.   This appointment will be held in person. Contact information: [place] [address] [phone]                 Follow-up recommendations:    Activity: as tolerated  Diet: heart healthy  Other: -Follow-up with your outpatient psychiatric provider -instructions on appointment date, time, and address (location) are provided to you in discharge paperwork.  -Take your psychiatric medications as prescribed at discharge - instructions are provided to you in the discharge paperwork  -Follow-up with outpatient primary care doctor and other specialists -for management of preventative medicine and chronic medical disease. Pt requested that I call [phone] if she has any positive test results that are still pending after discharge.   -Recommend abstinence from alcohol, tobacco, and other illicit drug use at discharge.   -If your psychiatric symptoms recur, worsen, or if you have side effects to your psychiatric medications, call your outpatient psychiatric provider, 911, [phone] or go to the nearest emergency department.  -If suicidal thoughts recur, call your outpatient psychiatric provider, 911, [phone] or go to the nearest emergency department.   Signed: Christoper Allegra, MD 04/20/2022, 9:48 AM  Total Time Spent in  Direct Patient Care:  I personally spent 35 minutes on the unit in direct patient care. The direct patient care time included face-to-face time with the patient, reviewing the patient's chart, communicating with other professionals, and coordinating care. Greater than 50% of this time was spent in counseling or coordinating care with the patient regarding goals of hospitalization, psycho-education, and discharge planning needs.   Janine Limbo, MD Psychiatrist

## 2022-04-20 NOTE — [facility] Group Notes (Signed)
Spiritual care group on grief and loss facilitated by Chaplain Janne Napoleon, [facility] and Lysle Morales, counseling intern.  Group Goal: Support / Education around grief and loss  Members engage in facilitated group support and psycho-social education.  Group Description:  Following introductions and group rules, group members engaged in facilitated group dialogue and support around topic of loss, with particular support around experiences of loss in their lives. Group Identified types of loss (relationships / self / things) and identified patterns, circumstances, and changes that precipitate losses. Reflected on thoughts / feelings around loss, normalized grief responses, and recognized variety in grief experience. Group encouraged individual reflection on safe space and on the coping skills that they are already utilizing.  Group drew on Adlerian / Rogerian and narrative framework  Patient Progress: [name] attended group and participated in activities and conversation. Though verbal participation was limited, she remained engaged for the duration of the session.  [address] [place] Pager, [phone]

## 2022-04-20 NOTE — Plan of Care (Signed)
  Problem: Education: Goal: Ability to make informed decisions regarding treatment will improve Outcome: Completed/Met   Problem: Health Behavior/Discharge Planning: Goal: Identification of resources available to assist in meeting health care needs will improve Outcome: Completed/Met   Problem: Medication: Goal: Compliance with prescribed medication regimen will improve Outcome: Completed/Met

## 2022-04-20 NOTE — [facility] Suicide Risk Assessment (Signed)
[facility] Discharge Suicide Risk Assessment   Principal Problem: Severe bipolar I disorder, most recent episode depressed ([place]) Discharge Diagnoses: Principal Problem:   Severe bipolar I disorder, most recent episode depressed ([place]) Active Problems:   Borderline personality disorder ([place])   Generalized anxiety disorder   Hx of suicide attempt   PTSD (post-traumatic stress disorder)   Total Time spent with patient: 15 minutes Patient is a 22 year old female with a reported psychiatric history of borderline personality disorder, bipolar disorder, PTSD, GAD, who was admitted to the psychiatric unit for worsening depression, suicidal thoughts, and command auditory hallucinations to harm herself.     During the patient's hospitalization, patient had extensive initial psychiatric evaluation, and follow-up psychiatric evaluations every day.   Psychiatric diagnoses provided upon initial assessment:  Bipolar disorder, current episode is depressed, type unclear possibly type II GAD PTSD Borderline personality disorder   Patient's psychiatric medications were adjusted on admission:  Start Abilify 2 mg once daily today, and increase to 5 mg once daily tomorrow.  For bipolar disorder Start Lexapro 5 mg once daily today for 2 doses, then increase to 10 mg once daily.  For GAD and PTSD Hold lamotrigine Hold buspirone as patient does not believe this was efficacious Start prazosin 1 mg nightly for PTSD related nightmares Start trazodone 50 mg nightly as needed for insomnia   During the hospitalization, other adjustments were made to the patient's psychiatric medication regimen:  -abilify incr to 5 mg once daily -lexapro incr to 10 mg once daily -hydroxyzine PRN decr to 10 mg tid prn    Patient's care was discussed during the interdisciplinary team meeting every day during the hospitalization.   The patient had some nausea which resolved. She otherwise denied having other side effects to prescribed  psychiatric medication.   Gradually, patient started adjusting to milieu. The patient was evaluated each day by a clinical provider to ascertain response to treatment. Improvement was noted by the patient's report of decreasing symptoms, improved sleep and appetite, affect, medication tolerance, behavior, and participation in unit programming.  Patient was asked each day to complete a self inventory noting mood, mental status, pain, new symptoms, anxiety and concerns.     Symptoms were reported as significantly decreased or resolved completely by discharge.    On day of discharge, the patient reports that their mood is stable. The patient denied having suicidal thoughts for more than 48 hours prior to discharge.  Patient denies having homicidal thoughts.  Patient denies having auditory hallucinations.  Patient denies any visual hallucinations or other symptoms of psychosis. The patient was motivated to continue taking medication with a goal of continued improvement in mental health.    The patient reports their target psychiatric symptoms of depression, CAH, suicidal thoughts, all responded well to the psychiatric medications, and the patient reports overall benefit other psychiatric hospitalization. Supportive psychotherapy was provided to the patient. The patient also participated in regular group therapy while hospitalized. Coping skills, problem solving as well as relaxation therapies were also part of the unit programming.   Labs were reviewed with the patient, and abnormal results were discussed with the patient.   The patient is able to verbalize their individual safety plan to this provider.   # It is recommended to the patient to continue psychiatric medications as prescribed, after discharge from the hospital.     # It is recommended to the patient to follow up with your outpatient psychiatric provider and PCP.   # It was discussed with  the patient, the impact of alcohol, drugs, tobacco  have been there overall psychiatric and medical wellbeing, and total abstinence from substance use was recommended the patient.ed.   # Prescriptions provided or sent directly to preferred pharmacy at discharge. Patient agreeable to plan. Given opportunity to ask questions. Appears to feel comfortable with discharge.    # In the event of worsening symptoms, the patient is instructed to call the crisis hotline, 911 and or go to the nearest ED for appropriate evaluation and treatment of symptoms. To follow-up with primary care provider for other medical issues, concerns and or health care needs   # Patient was discharged home to care of father, with a plan to follow up as noted below.   I called pt's father Mitsuyo Schilder [phone] on the day of dc. He visited the previous night and stated that pt had significant improvement, and does not have any concerns about dc to his home. Denies firearms in home. We discussed the pt's diagnosis, hospital course, treatment, response to treatment, and discharge planning. At the end of the call, the caller had no further questions.     Psychiatric Specialty Exam  Presentation  General Appearance:  Appropriate for Environment; Casual; Fairly Groomed  Eye Contact: Good  Speech: Normal Rate; Clear and Coherent  Speech Volume: Normal  Handedness: Right   Mood and Affect  Mood: Euthymic; Anxious  Duration of Depression Symptoms: Greater than two weeks  Affect: Appropriate; Congruent; Full Range   Thought Process  Thought Processes: Linear  Descriptions of Associations:Intact  Orientation:Full (Time, Place and Person)  Thought Content:Logical  History of Schizophrenia/Schizoaffective disorder:No  Duration of Psychotic Symptoms:N/A  Hallucinations:Hallucinations: None  Ideas of Reference:None  Suicidal Thoughts:Suicidal Thoughts: No  Homicidal Thoughts:Homicidal Thoughts: No   Sensorium  Memory: Immediate Good; Recent Good;  Remote Good  Judgment: Good  Insight: Good   Executive Functions  Concentration: Good  Attention Span: Good  Recall: Good  Fund of Knowledge: Good  Language: Good   Psychomotor Activity  Psychomotor Activity: Psychomotor Activity: Normal   Assets  Assets: Communication Skills; Desire for Improvement; Leisure Time; Resilience; Talents/Skills; Housing; Social Support   Sleep  Sleep: Sleep: Fair   Physical Exam: Physical Exam See discharge summary  ROS See discharge summary  Blood pressure 117/77, pulse (!) 103, temperature 98.2 F (36.8 C), temperature source Oral, resp. rate 18, height 5\' 9"  (1.753 m), weight 97.3 kg, SpO2 100 %. Body mass index is 31.69 kg/m.  Mental Status Per Nursing Assessment::   On Admission:  Suicidal ideation indicated by patient  Demographic factors:  Adolescent or young adult, Gay, lesbian, or bisexual orientation Loss Factors:  NA Historical Factors:  Prior suicide attempts, Family history of mental illness or substance abuse Risk Reduction Factors:  Living with another person, especially a relative, Positive social support  Continued Clinical Symptoms:  Mood is stable. Denies psychotic symptoms. Denies SI, HI.   Cognitive Features That Contribute To Risk:  None    Suicide Risk:  Mild:  Suicidal ideation of limited frequency, intensity, duration, and specificity.  There are no identifiable plans, no associated intent, mild dysphoria and related symptoms, good self-control (both objective and subjective assessment), few other risk factors, and identifiable protective factors, including available and accessible social support.   [employer], [place]. Go on 04/29/2022.   Why: You have an assessment appointment on 04/29/22 at 11:00 am, to obtain [facility] and therapy services.  This appointment will be held in  person. Contact information: [place] [address] [place]. Go on 05/11/2022.   Why: You have an appointment for medication management services on 05/11/22 at 3:00 pm.   This appointment will be held in person. Contact information: [place] [id number] [phone]                 Plan Of Care/Follow-up recommendations:   Activity: as tolerated   Diet: heart healthy   Other: -Follow-up with your outpatient psychiatric provider -instructions on appointment date, time, and address (location) are provided to you in discharge paperwork.   -Take your psychiatric medications as prescribed at discharge - instructions are provided to you in the discharge paperwork   -Follow-up with outpatient primary care doctor and other specialists -for management of preventative medicine and chronic medical disease. Pt requested that I call [phone] if she has any positive test results that are still pending after discharge.    -Recommend abstinence from alcohol, tobacco, and other illicit drug use at discharge.    -If your psychiatric symptoms recur, worsen, or if you have side effects to your psychiatric medications, call your outpatient psychiatric provider, 911, [phone] or go to the nearest emergency department.   -If suicidal thoughts recur, call your outpatient psychiatric provider, 911, [phone] or go to the nearest emergency department.     Christoper Allegra, MD 04/20/2022, 10:00 AM

## 2022-04-21 ENCOUNTER — Other Ambulatory Visit: Payer: Self-pay | Admitting: Psychiatry

## 2022-04-21 DIAGNOSIS — A549 Gonococcal infection, unspecified: Secondary | ICD-10-CM | POA: Insufficient documentation

## 2022-04-21 HISTORY — DX: Gonococcal infection, unspecified: A54.9

## 2022-04-21 LAB — URINE CYTOLOGY ANCILLARY ONLY
Bacterial Vaginitis-Urine: NEGATIVE
Candida Urine: NEGATIVE
Chlamydia: NEGATIVE
Comment: NEGATIVE
Comment: NEGATIVE
Comment: NORMAL
Neisseria Gonorrhea: POSITIVE — AB
Trichomonas: NEGATIVE

## 2022-04-21 MED ORDER — CEFTRIAXONE SODIUM 500 MG IJ SOLR: 500.0000 mg | Freq: Once | INTRAMUSCULAR | Status: DC

## 2022-05-15 ENCOUNTER — Ambulatory Visit (HOSPITAL_COMMUNITY)
Admission: EM | Admit: 2022-05-15 | Discharge: 2022-05-15 | Disposition: A | Discharge status: Home / Self Care | Payer: Medicaid Other | Attending: Urgent Care | Admitting: Urgent Care

## 2022-05-15 DIAGNOSIS — N898 Other specified noninflammatory disorders of vagina: Secondary | ICD-10-CM

## 2022-05-15 DIAGNOSIS — A549 Gonococcal infection, unspecified: Secondary | ICD-10-CM

## 2022-05-15 MED ORDER — CEFTRIAXONE SODIUM 500 MG IJ SOLR
500.0000 mg | Freq: Once | INTRAMUSCULAR | Status: AC
  Administered 2022-05-15: 500 mg via INTRAMUSCULAR

## 2022-05-15 MED ORDER — CEFTRIAXONE SODIUM 500 MG IJ SOLR
INTRAMUSCULAR | Status: AC
  Filled 2022-05-15: qty 500

## 2022-05-15 NOTE — Discharge Instructions (Signed)
You were tested today for gonorrhea, chlamydia, trichomonas, BV, and yeast.  Due to your previously positive test, you were treated for gonorrhea.  YOUR PARTNER MUST ALSO COMPLETE TREATMENT! YOU MUST ABSTAIN FROM ALL FORMS OF INTERCOURSE UNTIL ONE WEEK FROM THE COMPLETION OF PARTNER TREATMENT.  We will call you with the results of your test once received if there are any additional positive results.  As always, practice safer sexual practices by using protection each and every time, and limiting number of partners.

## 2022-05-15 NOTE — ED Provider Notes (Signed)
[facility]    CSN: [id number] Arrival date & time: 05/15/22  1721      History   Chief Complaint Chief Complaint  Patient presents with   Exposure to STD    HPI [name] is a 22 y.o. female.   22 year old female presents today due to treatment of gonorrhea.  She was recently admitted to inpatient behavioral health.  While she was there, she states she started developing some pelvic discomfort.  She tested positive for gonorrhea.  She was discharged on March 26, and never received the treatment.  Patient states she continues to have some vaginal discharge and some discomfort, but denies severe pain.  She is still sexually active with the same partner.  He has not gotten treatment either. She denies urinary symptoms.    Exposure to STD    Past Medical History:  Diagnosis Date   Anxiety    Asthma    Headache(784.0)    Hx of suicide attempt    Major depressive disorder    Morbid obesity (H[facility]) 03/25/2020   PTSD (post-traumatic stress disorder)     Patient Active Problem List   Diagnosis Date Noted   Gonorrhea 04/21/2022   Severe bipolar I disorder, most recent episode depressed 04/15/2022   Bipolar affective disorder, depressed, severe, with psychotic behavior 09/16/2020   Attention and concentration deficit 08/20/2020   Irritable bowel syndrome 03/25/2020   Morbid obesity 03/25/2020   Substance induced mood disorder 03/24/2020   Asthma due to seasonal allergies 03/13/2020   Seasonal allergies 03/13/2020   Tobacco abuse 03/13/2020   Homelessness 03/13/2020   Family history of breast cancer 03/13/2020   ASCUS with positive high risk HPV cervical 03/13/2020   Bipolar affective disorder, current episode manic 01/24/2020   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 disorder 01/24/2020   Polysubstance abuse 06/08/2017   Elevated blood pressure reading 04/13/2017   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 Authorizing Provider  ARIPiprazole (ABILIFY) 5 MG tablet Take 1 tablet (5 mg total) by mouth daily. 04/21/22 05/21/22 Yes Massengill, Harrold Donath, MD  escitalopram (LEXAPRO) 10 MG tablet Take 1 tablet (10 mg total) by mouth daily. 04/21/22 05/21/22 Yes Massengill, Harrold Donath, MD  hydrOXYzine (ATARAX) 10 MG tablet Take 1 tablet (10 mg total) by mouth 3 (three) times daily as needed for anxiety. 04/20/22  Yes Massengill, Harrold Donath, MD  prazosin (MINIPRESS) 1 MG capsule Take 1 capsule (1 mg total) by mouth at bedtime. 04/20/22 05/20/22 Yes Massengill, Harrold Donath, MD  traZODone (DESYREL) 50 MG tablet Take 1 tablet (50 mg total) by mouth at bedtime as needed for sleep. 04/20/22  Yes Massengill, Harrold Donath, MD  nicotine  polacrilex (NICORETTE) 2 MG gum Take 1 each (2 mg total) by mouth as needed for smoking cessation. 04/20/22   Massengill, Harrold Donath, MD    Family History Family History  Problem Relation Age of Onset   Diabetes Maternal Aunt    Diabetes Maternal Grandmother    Cancer Maternal Grandmother    Breast cancer Maternal Grandmother    Breast cancer Other     Social History Social History   Tobacco Use   Smoking status: Every Day    Packs/day: 0.50    Years: 4.00    Additional pack years: 0.00    Total pack years: 2.00    Types: Cigarettes, Cigars    Passive exposure: Past   Smokeless tobacco: Never   Tobacco comments:    black and mild with THC  Vaping Use   Vaping Use: Never used  Substance Use Topics    Alcohol use: Not Currently   Drug use: Not Currently    Types: Marijuana, MDMA (Ecstacy), Cocaine    Comment: Coc-last use 2/27; marijuana and MDMA last use 2/28     Allergies   Lactose intolerance (gi) and Tape   Review of Systems Review of Systems As per HPI  Physical Exam Triage Vital Signs ED Triage Vitals  Enc Vitals Group     BP 05/15/22 1822 (!) 138/93     Pulse Rate 05/15/22 1823 82     Resp 05/15/22 1822 18     Temp 05/15/22 1822 98.3 F (36.8 C)     Temp Source 05/15/22 1822 Oral     SpO2 05/15/22 1822 98 %     Weight --      Height --      Head Circumference --      Peak Flow --      Pain Score --      Pain Loc --      Pain Edu? --      Excl. in GC? --    No data found.  Updated Vital Signs BP (!) 138/93 (BP Location: Left Arm)   Pulse 82   Temp 98.3 F (36.8 C) (Oral)   Resp 18   SpO2 98%   Visual Acuity Right Eye Distance:   Left Eye Distance:   Bilateral Distance:    Right Eye Near:   Left Eye Near:    Bilateral Near:     Physical Exam Vitals and nursing note reviewed.  Constitutional:      General: She is not in acute distress.    Appearance: Normal appearance. She is obese. She is not ill-appearing, toxic-appearing or diaphoretic.  HENT:     Head: Normocephalic.  Eyes:     General: No scleral icterus.       Right eye: No discharge.        Left eye: No discharge.     Pupils: Pupils are equal, round, and reactive to light.  Cardiovascular:     Rate and Rhythm: Normal rate.  Pulmonary:     Effort: Pulmonary effort is normal. No respiratory distress.  Abdominal:     General: Abdomen is flat.     Tenderness: There is no right CVA tenderness, left CVA tenderness, guarding or rebound.  Skin:    General: Skin is warm.     Findings: No erythema or rash.  Neurological:     General: No focal deficit present.     Mental Status: She is alert and oriented to person, place, and time.  UC Treatments / Results  Labs (all labs  ordered are listed, but only abnormal results are displayed) Labs Reviewed  CERVICOVAGINAL ANCILLARY ONLY    EKG   Radiology No results found.  Procedures Procedures (including critical care time)  Medications Ordered in UC Medications  cefTRIAXone (ROCEPHIN) injection 500 mg (500 mg Intramuscular Given 05/15/22 1853)    Initial Impression / Assessment and Plan / UC Course  I have reviewed the triage vital signs and the nursing notes.  Pertinent labs & imaging results that were available during my care of the patient were reviewed by me and considered in my medical decision making (see chart for details).     Gonorrhea - pt with positive test on 3/24, was discharged before treatment was initiated. Pt here for ceftriaxone. Aptima swab collected again to ensure no additional positive results. Abstain x 7 days. Vaginal discharge - as above.    Final Clinical Impressions(s) / UC Diagnoses   Final diagnoses:  Gonorrhea  Vaginal discharge     Discharge Instructions      You were tested today for gonorrhea, chlamydia, trichomonas, BV, and yeast.  Due to your previously positive test, you were treated for gonorrhea.  YOUR PARTNER MUST ALSO COMPLETE TREATMENT! YOU MUST ABSTAIN FROM ALL FORMS OF INTERCOURSE UNTIL ONE WEEK FROM THE COMPLETION OF PARTNER TREATMENT.  We will call you with the results of your test once received if there are any additional positive results.  As always, practice safer sexual practices by using protection each and every time, and limiting number of partners.      ED Prescriptions   None    PDMP not reviewed this encounter.   Maretta Bees, [place] 05/15/22 1911

## 2022-05-15 NOTE — ED Triage Notes (Signed)
Pt was inpatient at [facility] and came back positive for Gonorrhea; however she never got treated.  Pt reports vaginal discharge.

## 2022-05-15 NOTE — ED Notes (Signed)
Reviewed my chart information

## 2022-05-17 LAB — CERVICOVAGINAL ANCILLARY ONLY
Bacterial Vaginitis (gardnerella): POSITIVE — AB
Candida Glabrata: NEGATIVE
Candida Vaginitis: POSITIVE — AB
Chlamydia: NEGATIVE
Comment: NEGATIVE
Comment: NEGATIVE
Comment: NEGATIVE
Comment: NEGATIVE
Comment: NEGATIVE
Comment: NORMAL
Neisseria Gonorrhea: NEGATIVE
Trichomonas: NEGATIVE

## 2022-05-19 ENCOUNTER — Telehealth (HOSPITAL_COMMUNITY): Payer: Self-pay | Admitting: Emergency Medicine

## 2022-05-19 MED ORDER — METRONIDAZOLE 500 MG PO TABS: 500.0000 mg | ORAL_TABLET | Freq: Two times a day (BID) | ORAL | 0 refills | Status: DC

## 2022-05-19 MED ORDER — FLUCONAZOLE 150 MG PO TABS: 150.0000 mg | ORAL_TABLET | Freq: Once | ORAL | 0 refills | Status: AC

## 2022-08-12 ENCOUNTER — Emergency Department (HOSPITAL_COMMUNITY): Payer: MEDICAID

## 2022-08-12 ENCOUNTER — Encounter (HOSPITAL_COMMUNITY): Payer: Self-pay | Admitting: Radiology

## 2022-08-12 ENCOUNTER — Emergency Department (HOSPITAL_COMMUNITY)
Admission: EM | Admit: 2022-08-12 | Discharge: 2022-08-13 | Disposition: A | Discharge status: Other Acute Inpatient Hospital | Payer: MEDICAID | Attending: Emergency Medicine | Admitting: Emergency Medicine

## 2022-08-12 DIAGNOSIS — T1491XA Suicide attempt, initial encounter: Secondary | ICD-10-CM | POA: Insufficient documentation

## 2022-08-12 DIAGNOSIS — F329 Major depressive disorder, single episode, unspecified: Secondary | ICD-10-CM | POA: Insufficient documentation

## 2022-08-12 DIAGNOSIS — R5383 Other fatigue: Secondary | ICD-10-CM | POA: Diagnosis not present

## 2022-08-12 DIAGNOSIS — X838XXA Intentional self-harm by other specified means, initial encounter: Secondary | ICD-10-CM | POA: Diagnosis not present

## 2022-08-12 DIAGNOSIS — T50902A Poisoning by unspecified drugs, medicaments and biological substances, intentional self-harm, initial encounter: Secondary | ICD-10-CM

## 2022-08-12 DIAGNOSIS — R45851 Suicidal ideations: Secondary | ICD-10-CM

## 2022-08-12 DIAGNOSIS — F1990 Other psychoactive substance use, unspecified, uncomplicated: Secondary | ICD-10-CM

## 2022-08-12 LAB — COMPREHENSIVE METABOLIC PANEL
ALT: 32 U/L (ref 0–44)
AST: 32 U/L (ref 15–41)
Albumin: 3.3 g/dL — ABNORMAL LOW (ref 3.5–5.0)
Alkaline Phosphatase: 43 U/L (ref 38–126)
Anion gap: 6 (ref 5–15)
BUN: 9 mg/dL (ref 6–20)
CO2: 21 mmol/L — ABNORMAL LOW (ref 22–32)
Calcium: 7.9 mg/dL — ABNORMAL LOW (ref 8.9–10.3)
Chloride: 111 mmol/L (ref 98–111)
Creatinine, Ser: 0.74 mg/dL (ref 0.44–1.00)
GFR, Estimated: >60 mL/min (ref >60)
Glucose, Bld: 99 mg/dL (ref 70–99)
Potassium: 3.8 mmol/L (ref 3.5–5.1)
Sodium: 138 mmol/L (ref 135–145)
Total Bilirubin: 0.4 mg/dL (ref 0.3–1.2)
Total Protein: 5.9 g/dL — ABNORMAL LOW (ref 6.5–8.1)

## 2022-08-12 LAB — CBC WITH DIFFERENTIAL/PLATELET
Abs Immature Granulocytes: 0.03 10*3/uL (ref 0.00–0.07)
Basophils Absolute: 0 10*3/uL (ref 0.0–0.1)
Basophils Relative: 0 %
Eosinophils Absolute: 0.1 10*3/uL (ref 0.0–0.5)
Eosinophils Relative: 1 %
HCT: 37.2 % (ref 36.0–46.0)
Hemoglobin: 12.1 g/dL (ref 12.0–15.0)
Immature Granulocytes: 1 %
Lymphocytes Relative: 22 %
Lymphs Abs: 1.4 10*3/uL (ref 0.7–4.0)
MCH: 31.9 pg (ref 26.0–34.0)
MCHC: 32.5 g/dL (ref 30.0–36.0)
MCV: 98.2 fL (ref 80.0–100.0)
Monocytes Absolute: 0.5 10*3/uL (ref 0.1–1.0)
Monocytes Relative: 7 %
Neutro Abs: 4.5 10*3/uL (ref 1.7–7.7)
Neutrophils Relative %: 69 %
Platelets: 184 10*3/uL (ref 150–400)
RBC: 3.79 MIL/uL — ABNORMAL LOW (ref 3.87–5.11)
RDW: 13.3 % (ref 11.5–15.5)
WBC: 6.5 10*3/uL (ref 4.0–10.5)
nRBC: 0 % (ref 0.0–0.2)

## 2022-08-12 LAB — HIV ANTIBODY (ROUTINE TESTING W REFLEX): HIV Screen 4th Generation wRfx: NONREACTIVE

## 2022-08-12 LAB — HCG, QUANTITATIVE, PREGNANCY: hCG, Beta Chain, Quant, S: <1 m[IU]/mL (ref <5)

## 2022-08-12 LAB — ACETAMINOPHEN LEVEL
Acetaminophen (Tylenol), Serum: <10 ug/mL — ABNORMAL LOW (ref 10–30)
Acetaminophen (Tylenol), Serum: <10 ug/mL — ABNORMAL LOW (ref 10–30)

## 2022-08-12 LAB — SALICYLATE LEVEL: Salicylate Lvl: <7 mg/dL — ABNORMAL LOW (ref 7.0–30.0)

## 2022-08-12 LAB — ETHANOL: Alcohol, Ethyl (B): <10 mg/dL (ref <10)
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
## 2022-08-12 NOTE — ED Notes (Signed)
Pt medically cleared by Johnnette Barrios RN [place] posion control
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
## 2022-08-12 NOTE — ED Notes (Signed)
Urine speciman requested x2 [place] continues to state she is unable to void. Pt is currently sleeping and no signs of distress or discomfort are noted respirations easy.

## 2022-08-12 NOTE — ED Notes (Signed)
Poison Control contacted and advised the following:  EKG CMP, Acetaminophen and Ethanol blood work  Pt needs to be observed for 8 hours from time of ingestion
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
## 2022-08-13 ENCOUNTER — Encounter (HOSPITAL_COMMUNITY): Payer: Self-pay | Admitting: Psychiatry

## 2022-08-13 ENCOUNTER — Inpatient Hospital Stay (HOSPITAL_COMMUNITY)
Admission: AD | Admit: 2022-08-13 | Discharge: 2022-08-19 | DRG: 885 | Disposition: A | Discharge status: Home / Self Care | Payer: MEDICAID | Source: Intra-hospital | Attending: Psychiatry | Admitting: Psychiatry

## 2022-08-13 ENCOUNTER — Other Ambulatory Visit: Payer: Self-pay

## 2022-08-13 DIAGNOSIS — F1590 Other stimulant use, unspecified, uncomplicated: Secondary | ICD-10-CM | POA: Insufficient documentation

## 2022-08-13 DIAGNOSIS — G4700 Insomnia, unspecified: Secondary | ICD-10-CM | POA: Diagnosis present

## 2022-08-13 DIAGNOSIS — F3160 Bipolar disorder, current episode mixed, unspecified: Principal | ICD-10-CM | POA: Diagnosis present

## 2022-08-13 DIAGNOSIS — R519 Headache, unspecified: Secondary | ICD-10-CM | POA: Diagnosis present

## 2022-08-13 DIAGNOSIS — H5712 Ocular pain, left eye: Secondary | ICD-10-CM | POA: Diagnosis present

## 2022-08-13 DIAGNOSIS — F1210 Cannabis abuse, uncomplicated: Secondary | ICD-10-CM | POA: Diagnosis present

## 2022-08-13 DIAGNOSIS — J45909 Unspecified asthma, uncomplicated: Secondary | ICD-10-CM | POA: Diagnosis present

## 2022-08-13 DIAGNOSIS — Z560 Unemployment, unspecified: Secondary | ICD-10-CM

## 2022-08-13 DIAGNOSIS — Y92230 Patient room in hospital as the place of occurrence of the external cause: Secondary | ICD-10-CM | POA: Diagnosis present

## 2022-08-13 DIAGNOSIS — F411 Generalized anxiety disorder: Secondary | ICD-10-CM | POA: Diagnosis present

## 2022-08-13 DIAGNOSIS — Z79899 Other long term (current) drug therapy: Secondary | ICD-10-CM

## 2022-08-13 DIAGNOSIS — B379 Candidiasis, unspecified: Secondary | ICD-10-CM | POA: Diagnosis present

## 2022-08-13 DIAGNOSIS — F17200 Nicotine dependence, unspecified, uncomplicated: Secondary | ICD-10-CM | POA: Diagnosis present

## 2022-08-13 DIAGNOSIS — F17210 Nicotine dependence, cigarettes, uncomplicated: Secondary | ICD-10-CM | POA: Diagnosis present

## 2022-08-13 DIAGNOSIS — F1110 Opioid abuse, uncomplicated: Secondary | ICD-10-CM | POA: Diagnosis present

## 2022-08-13 DIAGNOSIS — W19XXXA Unspecified fall, initial encounter: Secondary | ICD-10-CM | POA: Diagnosis present

## 2022-08-13 DIAGNOSIS — F1510 Other stimulant abuse, uncomplicated: Secondary | ICD-10-CM | POA: Diagnosis present

## 2022-08-13 DIAGNOSIS — F1290 Cannabis use, unspecified, uncomplicated: Secondary | ICD-10-CM | POA: Insufficient documentation

## 2022-08-13 DIAGNOSIS — F603 Borderline personality disorder: Secondary | ICD-10-CM | POA: Diagnosis present

## 2022-08-13 DIAGNOSIS — Z9151 Personal history of suicidal behavior: Secondary | ICD-10-CM

## 2022-08-13 DIAGNOSIS — T43592A Poisoning by other antipsychotics and neuroleptics, intentional self-harm, initial encounter: Secondary | ICD-10-CM | POA: Diagnosis present

## 2022-08-13 DIAGNOSIS — F4310 Post-traumatic stress disorder, unspecified: Secondary | ICD-10-CM | POA: Diagnosis present

## 2022-08-13 DIAGNOSIS — F1190 Opioid use, unspecified, uncomplicated: Secondary | ICD-10-CM | POA: Insufficient documentation

## 2022-08-13 DIAGNOSIS — F410 Panic disorder [episodic paroxysmal anxiety] without agoraphobia: Secondary | ICD-10-CM | POA: Diagnosis present

## 2022-08-13 DIAGNOSIS — F329 Major depressive disorder, single episode, unspecified: Secondary | ICD-10-CM | POA: Diagnosis not present

## 2022-08-13 DIAGNOSIS — Z5900 Homelessness unspecified: Secondary | ICD-10-CM

## 2022-08-13 DIAGNOSIS — F319 Bipolar disorder, unspecified: Principal | ICD-10-CM | POA: Diagnosis present

## 2022-08-13 LAB — URINALYSIS, ROUTINE W REFLEX MICROSCOPIC
Bilirubin Urine: NEGATIVE
Glucose, UA: NEGATIVE mg/dL
Hgb urine dipstick: NEGATIVE
Ketones, ur: NEGATIVE mg/dL
Leukocytes,Ua: NEGATIVE
Nitrite: NEGATIVE
Protein, ur: NEGATIVE mg/dL
Specific Gravity, Urine: 1.015 (ref 1.005–1.030)
pH: 6 (ref 5.0–8.0)

## 2022-08-13 LAB — RAPID URINE DRUG SCREEN, HOSP PERFORMED
Amphetamines: NOT DETECTED
Barbiturates: NOT DETECTED
Benzodiazepines: NOT DETECTED
Cocaine: NOT DETECTED
Opiates: NOT DETECTED
Tetrahydrocannabinol: DETECTED — AB

## 2022-08-13 MED ORDER — PRAZOSIN HCL 1 MG PO CAPS
1.0000 mg | ORAL_CAPSULE | Freq: Every day | ORAL | Status: DC
  Administered 2022-08-13 – 2022-08-18 (×6): 1 mg via ORAL
  Filled 2022-08-13 (×9): qty 1

## 2022-08-13 MED ORDER — ESCITALOPRAM OXALATE 10 MG PO TABS
10.0000 mg | ORAL_TABLET | Freq: Every day | ORAL | Status: DC
  Administered 2022-08-13 – 2022-08-19 (×7): 10 mg via ORAL
  Filled 2022-08-13 (×9): qty 1

## 2022-08-13 MED ORDER — HYDROXYZINE HCL 25 MG PO TABS
25.0000 mg | ORAL_TABLET | Freq: Three times a day (TID) | ORAL | Status: DC | PRN
  Administered 2022-08-13: 25 mg via ORAL
  Filled 2022-08-13: qty 1

## 2022-08-13 MED ORDER — TRAZODONE HCL 50 MG PO TABS
50.0000 mg | ORAL_TABLET | Freq: Every evening | ORAL | Status: DC | PRN
  Administered 2022-08-13 – 2022-08-18 (×5): 50 mg via ORAL
  Filled 2022-08-13 (×5): qty 1

## 2022-08-13 MED ORDER — HALOPERIDOL 5 MG PO TABS: 5.0000 mg | ORAL_TABLET | Freq: Three times a day (TID) | ORAL | Status: DC | PRN

## 2022-08-13 MED ORDER — DIPHENHYDRAMINE HCL 25 MG PO CAPS: 50.0000 mg | ORAL_CAPSULE | Freq: Three times a day (TID) | ORAL | Status: DC | PRN

## 2022-08-13 MED ORDER — LORAZEPAM 1 MG PO TABS: 2.0000 mg | ORAL_TABLET | Freq: Three times a day (TID) | ORAL | Status: DC | PRN

## 2022-08-13 MED ORDER — NICOTINE 21 MG/24HR TD PT24
21.0000 mg | MEDICATED_PATCH | Freq: Every day | TRANSDERMAL | Status: DC
  Administered 2022-08-13 – 2022-08-19 (×7): 21 mg via TRANSDERMAL
  Filled 2022-08-13 (×8): qty 1

## 2022-08-13 MED ORDER — HALOPERIDOL LACTATE 5 MG/ML IJ SOLN: 5.0000 mg | Freq: Three times a day (TID) | INTRAMUSCULAR | Status: DC | PRN

## 2022-08-13 MED ORDER — LORAZEPAM 2 MG/ML IJ SOLN: 2.0000 mg | Freq: Three times a day (TID) | INTRAMUSCULAR | Status: DC | PRN

## 2022-08-13 MED ORDER — MAGNESIUM HYDROXIDE 400 MG/5ML PO SUSP: 30.0000 mL | Freq: Every day | ORAL | Status: DC | PRN

## 2022-08-13 MED ORDER — ACETAMINOPHEN 325 MG PO TABS
650.0000 mg | ORAL_TABLET | Freq: Four times a day (QID) | ORAL | Status: DC | PRN
  Administered 2022-08-13: 650 mg via ORAL
  Filled 2022-08-13: qty 2

## 2022-08-13 MED ORDER — DIPHENHYDRAMINE HCL 50 MG/ML IJ SOLN: 50.0000 mg | Freq: Three times a day (TID) | INTRAMUSCULAR | Status: DC | PRN

## 2022-08-13 MED ORDER — ALUM & MAG HYDROXIDE-SIMETH 200-200-20 MG/5ML PO SUSP: 30.0000 mL | ORAL | Status: DC | PRN

## 2022-08-13 MED ORDER — ARIPIPRAZOLE 5 MG PO TABS
5.0000 mg | ORAL_TABLET | Freq: Every day | ORAL | Status: DC
  Administered 2022-08-13 – 2022-08-14 (×2): 5 mg via ORAL
  Filled 2022-08-13 (×3): qty 1

## 2022-08-13 NOTE — [facility] Group Notes (Signed)
[facility] Group Notes:  (Nursing/MHT/Case Management/Adjunct)  Date:  08/13/2022  Time:  9:02 PM  Type of Therapy:   AA group  Participation Level:  Active  Participation Quality:  Appropriate  Affect:  Appropriate  Cognitive:  Appropriate  Insight:  Appropriate  Engagement in Group:  Engaged  Modes of Intervention:  Education  Summary of Progress/Problems: Pt attended [employer].  Noah Delaine 08/13/2022, 9:02 PM

## 2022-08-13 NOTE — [facility] Group Notes (Signed)
[name] standing in the doorway talking to [name]. [name] said to the writer I do not know what you are looking for you will not find anything.[name] standing in the hallway in front of [name] door talking to her. [name] tells Clinical research associate you are not going to find anything. I do not know what you are looking for. Writer smiled and continue to do environmental safety checks. [name] continue to stand in the hallway in front [name]'s door. Once the writer finish the environmental safety check in the last room 407. Writer notice the two paitents were not in the hallway. [name] room 405 door closed. Writer went to the nurse's station and asked the nurse [name] to come with her to room 405 the door is close. There needs to be two people to witness what will happen inside the room. As soon as [name] the RN open the door.I smell cigarette smoke. [name] asked [name] and [name] why are they in the room and bathroom with the door closed? The Clinical research associate informed the charge nurse about the findings.

## 2022-08-13 NOTE — ED Notes (Signed)
Pt belongings moved to [employer] 36.

## 2022-08-13 NOTE — Progress Notes (Signed)
[facility]/BMU LCSW Progress Note   08/13/2022    3:58 PM  [name]      Type of Note: Visit with Patient    CSW tried to complete patient assessment and patient refused stating that she did not feel like talking and to come back tomorrow .     Signed:   Jacob Moores, MSW, [facility] 08/13/2022 3:58 PM
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
## 2022-08-13 NOTE — Progress Notes (Signed)
Pt has been accepted to [facility] TODAY 08/13/2022. Bed assignment: 405-1  Pt meets inpatient criteria per Alona Bene, PMHNP  Attending Physician will be Phineas Inches, MD  Report can be called to: - Adult unit: [phone]  Pt can arrive after 1 PM  Care Team Notified: [facility] [place], RN, [place], PMHNP, and Lum Babe, RN  [place], [place]  08/13/2022 11:27 AM
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
## 2022-08-13 NOTE — ED Notes (Signed)
Patient to room 36.  Patient alert, oriented and ambulatory. Patient oriented to unit and room

## 2022-08-13 NOTE — ED Provider Notes (Signed)
Emergency Medicine Observation Re-evaluation Note  [name] is a 22 y.o. female, seen on rounds today.  Pt initially presented to the ED for complaints of possible overdose, substance use disorder, and suicidal ideation. Pt currently calm, no distress.   Physical Exam  BP 134/79 (BP Location: Left Arm)   Pulse 69   Temp 98.8 F (37.1 C) (Oral)   Resp 18   SpO2 100%  Physical Exam General: resting Cardiac: regular rate Lungs: breathing comfortably. Psych: calm.  ED Course / MDM    I have reviewed the labs performed to date as well as medications administered while in observation.  Recent changes in the last 24 hours include ED obs, med management, reassessment.   Plan  [facility] team has assessed and pt accepted to [facility].  Pt currently appears stable for admission to [facility].     Cathren Laine, MD 08/13/22 [phone]

## 2022-08-13 NOTE — Progress Notes (Incomplete)
Informed by MHT that she was unable to locate Tran[place] and another female patient on the 400 hall.

## 2022-08-13 NOTE — Progress Notes (Signed)
Pt is under review by [facility] at [facility] for TODAY 08/13/2022 PENDING IVC uploaded to chart, medical clearance, and Labs.  Pt meets inpatient criteria per Rockney Ghee, NP   Attending Physician will be Dr. Phineas Inches, MD   Report can be called to:-Adult unit: [phone]  Pt can arrive after: [facility] will coordinate with care team.  Care Team notified: [facility] Fransico Michael, RN, [facility] [place], [place], RN, Al Corp[place], [facility]   [place], [place] 08/13/2022 @ 12:52 AM

## 2022-08-13 NOTE — ED Notes (Signed)
Patient discharged off unit to [facility] per provider. Patient alert, calm, cooperative, no s/s of distress at this time. Patient discharge information and belongings given to [employer] for transport. Patient ambulatory off unit, escorted and transported by [employer].

## 2022-08-14 DIAGNOSIS — F410 Panic disorder [episodic paroxysmal anxiety] without agoraphobia: Secondary | ICD-10-CM | POA: Insufficient documentation

## 2022-08-14 DIAGNOSIS — F1190 Opioid use, unspecified, uncomplicated: Secondary | ICD-10-CM | POA: Insufficient documentation

## 2022-08-14 DIAGNOSIS — F3160 Bipolar disorder, current episode mixed, unspecified: Principal | ICD-10-CM

## 2022-08-14 DIAGNOSIS — F1590 Other stimulant use, unspecified, uncomplicated: Secondary | ICD-10-CM | POA: Insufficient documentation

## 2022-08-14 DIAGNOSIS — F1290 Cannabis use, unspecified, uncomplicated: Secondary | ICD-10-CM | POA: Insufficient documentation

## 2022-08-14 LAB — RPR: RPR Ser Ql: NONREACTIVE — AB

## 2022-08-14 MED ORDER — POLYETHYLENE GLYCOL 3350 17 G PO PACK: 17.0000 g | PACK | Freq: Every day | ORAL | Status: DC | PRN

## 2022-08-14 MED ORDER — CLONIDINE HCL 0.1 MG PO TABS
0.1000 mg | ORAL_TABLET | Freq: Once | ORAL | Status: DC
  Filled 2022-08-14: qty 1

## 2022-08-14 MED ORDER — CLONIDINE HCL 0.1 MG PO TABS
0.1000 mg | ORAL_TABLET | Freq: Two times a day (BID) | ORAL | Status: AC
  Administered 2022-08-16: 0.1 mg via ORAL
  Filled 2022-08-14 (×2): qty 1

## 2022-08-14 MED ORDER — BISMUTH SUBSALICYLATE 262 MG PO CHEW: 524.0000 mg | CHEWABLE_TABLET | ORAL | Status: DC | PRN

## 2022-08-14 MED ORDER — CLONIDINE HCL 0.1 MG PO TABS: 0.1000 mg | ORAL_TABLET | ORAL | Status: DC | PRN

## 2022-08-14 MED ORDER — HYDROXYZINE HCL 25 MG PO TABS
25.0000 mg | ORAL_TABLET | Freq: Three times a day (TID) | ORAL | Status: DC | PRN
  Administered 2022-08-14 – 2022-08-18 (×5): 25 mg via ORAL
  Filled 2022-08-14 (×5): qty 1

## 2022-08-14 MED ORDER — LOPERAMIDE HCL 2 MG PO CAPS: 2.0000 mg | ORAL_CAPSULE | ORAL | Status: AC | PRN

## 2022-08-14 MED ORDER — NAPROXEN 500 MG PO TABS
500.0000 mg | ORAL_TABLET | Freq: Two times a day (BID) | ORAL | Status: DC | PRN
  Administered 2022-08-14 – 2022-08-18 (×4): 500 mg via ORAL
  Filled 2022-08-14 (×4): qty 1

## 2022-08-14 MED ORDER — ALUM & MAG HYDROXIDE-SIMETH 200-200-20 MG/5ML PO SUSP: 30.0000 mL | ORAL | Status: DC | PRN

## 2022-08-14 MED ORDER — CLONIDINE HCL 0.1 MG PO TABS
0.1000 mg | ORAL_TABLET | Freq: Three times a day (TID) | ORAL | Status: AC
  Administered 2022-08-15 (×2): 0.1 mg via ORAL
  Filled 2022-08-14 (×3): qty 1

## 2022-08-14 MED ORDER — ONDANSETRON HCL 4 MG PO TABS
8.0000 mg | ORAL_TABLET | Freq: Three times a day (TID) | ORAL | Status: DC | PRN
  Administered 2022-08-15 – 2022-08-17 (×2): 8 mg via ORAL
  Filled 2022-08-14 (×2): qty 2

## 2022-08-14 MED ORDER — ACETAMINOPHEN 325 MG PO TABS
650.0000 mg | ORAL_TABLET | Freq: Four times a day (QID) | ORAL | Status: DC | PRN
  Administered 2022-08-14 – 2022-08-15 (×2): 650 mg via ORAL
  Filled 2022-08-14 (×3): qty 2

## 2022-08-14 MED ORDER — SENNA 8.6 MG PO TABS
1.0000 | ORAL_TABLET | Freq: Every evening | ORAL | Status: DC | PRN
  Administered 2022-08-18: 8.6 mg via ORAL
  Filled 2022-08-14: qty 1

## 2022-08-14 MED ORDER — DICYCLOMINE HCL 20 MG PO TABS: 20.0000 mg | ORAL_TABLET | Freq: Four times a day (QID) | ORAL | Status: DC | PRN

## 2022-08-14 MED ORDER — CLONIDINE HCL 0.1 MG PO TABS
0.1000 mg | ORAL_TABLET | Freq: Four times a day (QID) | ORAL | Status: AC
  Administered 2022-08-14 – 2022-08-15 (×4): 0.1 mg via ORAL
  Filled 2022-08-14 (×5): qty 1

## 2022-08-14 MED ORDER — METHOCARBAMOL 500 MG PO TABS
500.0000 mg | ORAL_TABLET | Freq: Three times a day (TID) | ORAL | Status: DC | PRN
  Administered 2022-08-15 – 2022-08-16 (×3): 500 mg via ORAL
  Filled 2022-08-14 (×3): qty 1

## 2022-08-14 MED ORDER — NICOTINE POLACRILEX 2 MG MT GUM: 2.0000 mg | CHEWING_GUM | OROMUCOSAL | Status: DC | PRN

## 2022-08-14 NOTE — Progress Notes (Addendum)
   08/13/22 2000  Psych Admission Type (Psych Patients Only)  Admission Status Involuntary  Psychosocial Assessment  Patient Complaints Anxiety;Depression  Eye Contact Fair  Facial Expression Sad;Worried  Affect Anxious  Speech Logical/coherent  Interaction Assertive  Motor Activity Fidgety  Appearance/Hygiene Unremarkable  Behavior Characteristics Cooperative;Impulsive;Anxious  Mood Depressed;Anxious  Thought Process  Coherency WDL  Content Blaming others;Blaming self  Delusions None reported or observed  Perception WDL  Hallucination None reported or observed  Judgment Poor  Confusion None  Danger to Self  Current suicidal ideation? Denies  Danger to Others  Danger to Others None reported or observed   Pt is pleasant and cooperative. Pt was offered support and encouragement. Pt was given scheduled medications. Given PRN Trazodone, PRN Hydroxyzine, and PRN Tylenol per MAR. Q 15 minute checks were done for safety. Pt attended group. Pt has no complaints.Pt receptive to treatment and safety maintained on unit.
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
## 2022-08-14 NOTE — Progress Notes (Addendum)
D:   Patient's self inventory sheet, patient sleeps good, sleep medication helpful.  Good appetite, low energy level, poor concentration.  Rated depression, anxiety and hopeless #10.   jWithdrawals, sedation, diarrhea, cravings, cramping, agitation, nausea, irritability.  Denied SI.  Physical problems, lightheaded, pain, dizzy, headaches, blurred vision.  Physical pain, worst pain #8 in past 24 hours.  Head, stomach, body aches.  Patient plans to stay awake and talk to SW.  Goal is ask for help.  No discharge plans. A:  Medications administered per MD orders.  Emotional support and encouragement given patient. R:  Safety maintained with 15 minute checks.  Denied SI and HI, contracts for  safety.  Denied A/V hallucinations.  Patient stated this morning that she was too tired and sleepy to get out of bed.  Patient did go to two morning groups.

## 2022-08-14 NOTE — Group Note (Signed)
LCSW Group Therapy Note  08/14/2022    10:00-11:00am   Type of Therapy and Topic:  Early Messages Received About Anger  Participation Level:  Active   Description of Group:   In this group, patients shared and discussed the early messages received in their lives about anger through parental or other adult modeling, teaching, repression, punishment, violence, and more.  Participants identified how those lessons influence how they often react when angered.  The group discussed that anger is a secondary emotion caused by other feelings such as fear, judgment, shame, or embarrassment.  Some of the [employer] were discussed and it was recommended that they share the handout with their family or other important adults and discuss during a calm time how using these could help them communicate better and have improved outcomes.  The rules shared included "I" statements, taking a break, one person speaking at a time, not cursing or calling names, and only one topic being discussed at a time.  Therapeutic Goals: Patients will identify one or more childhood message about anger that they received and how it was taught to them. Patients will discuss how these childhood experiences have influenced and continue to influence their own expression or repression of anger even today. Patients will explore possible primary emotions that tend to fuel their secondary emotion of anger. Patients will learn that anger itself is normal and cannot be eliminated, and that healthier coping skills can assist with resolving conflict rather than worsening situations.  Summary of Patient Progress:  The patient shared that her childhood lessons about anger were that it was okay for adults to be angry and show it, but it was not allowed in children.  She realizes now she was, however, angry.  She ultimately made a decision which she believes was conscious to ALWAYS be angry and to ALWAYS show it.  As a result, in her  adulthood she is "angry as hell."  What bothers her is when her anger is valid and right, but she is still seen as a mental health patient because she is "angry."  She spoke frequently but on topic and with insight throughout the group.  She asked about how to change this cycle for her child.   The patient was open to the [employer] explained in group.  The patient participated fully and demonstrated insight.  Therapeutic Modalities:   Cognitive Behavioral Therapy Motivation Interviewing  Lynnell Chad, LCSW 08/14/2022 4:10 PM

## 2022-08-14 NOTE — [facility] Group Notes (Signed)
[facility] Group Notes:  (Nursing/MHT/Case Management/Adjunct)  Date:  08/14/2022  Time:  9:37 PM  Type of Therapy:   Wrap-up group  Participation Level:  Active  Participation Quality:  Appropriate  Affect:  Appropriate  Cognitive:  Appropriate  Insight:  Appropriate  Engagement in Group:  Engaged  Modes of Intervention:  Education  Summary of Progress/Problems: Pt goal to stay out of room and attend groups. Pt reports she met her goal. Day was 9/10.   Noah Delaine 08/14/2022, 9:37 PM
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
## 2022-08-14 NOTE — Plan of Care (Signed)
  Problem: Activity: Goal: Interest or engagement in activities will improve Outcome: Progressing   Problem: Medication: Goal: Compliance with prescribed medication regimen will improve Outcome: Progressing   Problem: Coping: Goal: Coping ability will improve Outcome: Not Progressing
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
## 2022-08-14 NOTE — Plan of Care (Signed)
Nurse discussed anxiety, depression and coping skills with patient.  

## 2022-08-14 NOTE — Progress Notes (Signed)
   08/14/22 0554  15 Minute Checks  Location Bedroom  Visual Appearance Calm  Behavior Sleeping  Sleep (Behavioral Health Patients Only)  Calculate sleep? (Click Yes once per 24 hr at 0600 safety check) Yes  Documented sleep last 24 hours 9

## 2022-08-14 NOTE — [facility] Counselor (Signed)
CSW made contact with [facility], per pt request to inquire about personal items. CSW was informed by shelter staff that patient was last enrolled 1.5 months ago and that at registration consumers are notified of policy that they have 72 hours to retrieve items after discharge from shelter. Remaining items are then donated.   CSW will provide update to patient.

## 2022-08-15 LAB — LIPID PANEL
Cholesterol: 155 mg/dL (ref 0–200)
HDL: 44 mg/dL (ref >40)
LDL Cholesterol: 101 mg/dL — ABNORMAL HIGH (ref 0–99)
Total CHOL/HDL Ratio: 3.5 RATIO
Triglycerides: 50 mg/dL (ref <150)
VLDL: 10 mg/dL (ref 0–40)

## 2022-08-15 LAB — RAPID HIV SCREEN (HIV 1/2 AB+AG)
HIV 1/2 Antibodies: NONREACTIVE
HIV-1 P24 Antigen - HIV24: NONREACTIVE

## 2022-08-15 LAB — TSH: TSH: 0.391 u[IU]/mL (ref 0.350–4.500)

## 2022-08-15 LAB — PREGNANCY, URINE: Preg Test, Ur: NEGATIVE

## 2022-08-15 LAB — HEPATITIS PANEL, ACUTE
HCV Ab: NONREACTIVE
Hep A IgM: NONREACTIVE
Hep B C IgM: NONREACTIVE
Hepatitis B Surface Ag: NONREACTIVE

## 2022-08-15 LAB — HEMOGLOBIN A1C
Hgb A1c MFr Bld: 5.2 % (ref 4.8–5.6)
Mean Plasma Glucose: 102.54 mg/dL

## 2022-08-15 NOTE — Hospital Course (Addendum)
Reason for admission: overdose on 10-20 pills of aripiprazole while livestreaming on FB. Using crack cocaine and heroin prior to admission Psychiatric diagnoses: bipolar I current mixed episode, GAD, panic disorder, PTSD, BPD, stimulant+opioid+cannabis+tobacco use disorders Psychotropic medications: escitalopram 10 mg, prazosin 1 mg, clonidine taper Medical diagnoses: none Hospital course: admitted 7/20. Restarted on home meds but held aripiprazole due to recent overdose Disposition: likely home to father  To do: assess SI and HI (towards ex-bf). Assess withdrawal symptoms. F/u on STI screening. Do not add back on aripiprazole yet

## 2022-08-15 NOTE — [facility] Group Notes (Signed)
[facility] Group Notes:  (Nursing/MHT/Case Management/Adjunct)  Date:  08/15/2022  Time:  1:31 PM  Type of Therapy:  Psychoeducational Skills  Participation Level:  Active  Participation Quality:  Appropriate  Affect:  Appropriate  Cognitive:  Appropriate  Insight:  Appropriate  Engagement in Group:  Engaged  Modes of Intervention:  Discussion, Education, and Exploration  Summary of Progress/Problems: Patients were given education on how negative thinking can impact the brain, our mental health. Pt were then asked to practice positive affirmations. Pt attended and was appropriate.  [name] 08/15/2022, 1:31 PM

## 2022-08-15 NOTE — [facility] Group Notes (Signed)
[facility] Group Notes:  (Nursing/MHT/Case Management/Adjunct)  Date:  08/15/2022  Time:  1:12 PM  Type of Therapy:  Psychoeducational Skills  Participation Level:  did not attend  Participation Quality:    Affect:    Cognitive:    Insight:   Engagement in Group:    Modes of Intervention:  Discussion education exploration  Summary of Progress/Problems: Patient attended group in which a podcast was played by Berniece Pap from [employer] discussing mental health and wellness. Tips and tricks on identifying how to support mental wellbeing. Pt attended first few minutes and then left.[place] Ouch, Sherryl Valido 08/15/2022, 1:12 PM

## 2022-08-15 NOTE — Progress Notes (Signed)
   08/15/22 1052  Psych Admission Type (Psych Patients Only)  Admission Status Involuntary  Psychosocial Assessment  Patient Complaints Anxiety  Eye Contact Fair  Facial Expression Anxious  Affect Anxious  Speech Logical/coherent  Interaction Assertive  Motor Activity Fidgety  Appearance/Hygiene Unremarkable  Behavior Characteristics Cooperative;Anxious  Mood Anxious  Thought Process  Coherency WDL  Content WDL  Delusions None reported or observed  Hallucination None reported or observed  Judgment Poor  Confusion None  Danger to Self  Current suicidal ideation? Denies  Danger to Others  Danger to Others None reported or observed
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
## 2022-08-15 NOTE — Progress Notes (Signed)
D) Pt received calm, visible, participating in milieu, and in no acute distress. Pt A & O x4. Pt denies SI, HI, A/ V H, depression, anxiety and pain at this time. A) Pt encouraged to drink fluids. Pt encouraged to come to staff with needs. Pt encouraged to attend and participate in groups. Pt encouraged to set reachable goals.  R) Pt remained safe on unit, in no acute distress, will continue to assess.     08/15/22 2000  Psych Admission Type (Psych Patients Only)  Admission Status Involuntary  Psychosocial Assessment  Patient Complaints Anxiety  Eye Contact Fair  Facial Expression Anxious  Affect Anxious  Speech Logical/coherent  Interaction Assertive  Motor Activity Fidgety  Appearance/Hygiene Disheveled  Behavior Characteristics Anxious  Mood Anxious  Thought Process  Coherency WDL  Content Blaming self;Blaming others  Delusions None reported or observed  Perception WDL  Hallucination None reported or observed  Judgment Poor  Confusion None  Danger to Self  Current suicidal ideation? Denies  Danger to Others  Danger to Others None reported or observed

## 2022-08-15 NOTE — [facility] Group Notes (Signed)
[facility] Group Notes:  (Nursing/MHT/Case Management/Adjunct)  Date:  08/15/2022  Time:  9:02 PM  Type of Therapy:   Wrap-up group  Participation Level:  Did Not Attend  Participation Quality:    Affect:    Cognitive:    Insight:    Engagement in Group:    Modes of Intervention:    Summary of Progress/Problems: Pt refused to attend group.  Noah Delaine 08/15/2022, 9:02 PM
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
## 2022-08-16 ENCOUNTER — Encounter (HOSPITAL_COMMUNITY): Payer: Self-pay

## 2022-08-16 LAB — GC/CHLAMYDIA PROBE AMP (~~LOC~~) NOT AT ARMC
Chlamydia: NEGATIVE
Comment: NEGATIVE
Comment: NORMAL
Neisseria Gonorrhea: NEGATIVE

## 2022-08-16 MED ORDER — FLUCONAZOLE 150 MG PO TABS
150.0000 mg | ORAL_TABLET | Freq: Once | ORAL | Status: AC
  Administered 2022-08-16: 150 mg via ORAL
  Filled 2022-08-16 (×3): qty 1

## 2022-08-16 NOTE — [facility] Group Notes (Signed)
Spiritual care group on grief and loss facilitated by Chaplain Dyanne Carrel, [facility]  Group Goal: Support / Education around grief and loss  Members engage in facilitated group support and psycho-social education.  Group Description:  Following introductions and group rules, group members engaged in facilitated group dialogue and support around topic of loss, with particular support around experiences of loss in their lives. Group Identified types of loss (relationships / self / things) and identified patterns, circumstances, and changes that precipitate losses. Reflected on thoughts / feelings around loss, normalized grief responses, and recognized variety in grief experience. Group encouraged individual reflection on safe space and on the coping skills that they are already utilizing.  Group drew on Adlerian / Rogerian and narrative framework  Patient Progress: Innocence attended group and actively engaged and participated in group conversation and activities.  She shared about several losses including 3 children by miscarriage and the loss of her grandmother.  Her comments were on topic and she asked thoughtful questions and was supportive of peers.

## 2022-08-16 NOTE — Progress Notes (Signed)
   08/16/22 0800  Psych Admission Type (Psych Patients Only)  Admission Status Involuntary  Psychosocial Assessment  Patient Complaints Anxiety  Eye Contact Fair  Facial Expression Anxious  Affect Anxious  Speech Logical/coherent  Interaction Assertive  Motor Activity Fidgety  Appearance/Hygiene Disheveled  Behavior Characteristics Anxious  Mood Anxious  Thought Process  Coherency WDL  Content Blaming others  Delusions None reported or observed  Perception WDL  Hallucination None reported or observed  Judgment Limited  Confusion None  Danger to Self  Current suicidal ideation? Denies  Danger to Others  Danger to Others None reported or observed

## 2022-08-16 NOTE — Group Note (Signed)
Date:  08/16/2022 Time:  10:58 AM  Group Topic/Focus:  Emotional Education:   The focus of this group is to discuss what feelings/emotions are, and how they are experienced.    Participation Level:  Active  Participation Quality:  Appropriate  Affect:  Appropriate  Cognitive:  Appropriate  Insight: Appropriate  Engagement in Group:  Engaged  Modes of Intervention:  Activity  Additional Comments:    [name] 08/16/2022, 10:58 AM

## 2022-08-16 NOTE — [facility] Group Notes (Signed)
Psychoeducational Group Note  Date:  08/16/2022 Time:  2000  Group Topic/Focus:  Alcoholics Anonymous Meeting  Participation Level: Did Not Attend  Participation Quality:  Not Applicable  Affect:  Not Applicable  Cognitive:  Not Applicable  Insight:  Not Applicable  Engagement in Group: Not Applicable  Additional Comments:  Did not attend.   Johann Capers S 08/16/2022, 10:08 PM

## 2022-08-16 NOTE — Group Note (Signed)
Date:  08/16/2022 Time:  9:22 AM  Group Topic/Focus:  Goals Group:   The focus of this group is to help patients establish daily goals to achieve during treatment and discuss how the patient can incorporate goal setting into their daily lives to aide in recovery.    Participation Level:  Active  Participation Quality:  Appropriate and Intrusive  Affect:  Appropriate  Cognitive:  Appropriate  Insight: Good  Engagement in Group:  Engaged  Modes of Intervention:  Discussion and Exploration  Additional Comments:  Pt goal: Talk to the Dr about leaving Wednesday or Thursday to take care of legal matters.   [name] 08/16/2022, 9:22 AM
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
## 2022-08-16 NOTE — Plan of Care (Signed)

## 2022-08-16 NOTE — Group Note (Signed)
Occupational Therapy Group Note  Group Topic: Sleep Hygiene  Group Date: 08/16/2022 Start Time: 1430 End Time: 1500 Facilitators: Ted Mcalpine, OT   Group Description: Group encouraged increased participation and engagement through topic focused on sleep hygiene. Patients reflected on the quality of sleep they typically receive and identified areas that need improvement. Group was given background information on sleep and sleep hygiene, including common sleep disorders. Group members also received information on how to improve one's sleep and introduced a sleep diary as a tool that can be utilized to track sleep quality over a length of time. Group session ended with patients identifying one or more strategies they could utilize or implement into their sleep routine in order to improve overall sleep quality.        Therapeutic Goal(s):  Identify one or more strategies to improve overall sleep hygiene  Identify one or more areas of sleep that are negatively impacted (sleep too much, too little, etc)     Participation Level: Engaged   Participation Quality: Independent   Behavior: Appropriate   Speech/Thought Process: Relevant   Affect/Mood: Appropriate   Insight: Fair   Judgement: Fair      Modes of Intervention: Education  Patient Response to Interventions:  Engaged   Plan: Continue to engage patient in OT groups 2 - 3x/week.  08/16/2022  Ted Mcalpine, OT  Kerrin Champagne, OT

## 2022-08-16 NOTE — Progress Notes (Signed)
Pt was at desk for VS c/o feelings like "stomach is on Health visitor brought pt ginger ale, and saltine crackers. Pt then reported seeing spots. Pt was escorted to room by writer and assisted to bed with encouragement to stay in bed until breakfast.   6:10 pt yelling down hall for writer, Clinical research associate found pt on floor resting head on arm reporting that she had fallen. Pt reported no pain, and reporting that she did not hit her head. Provider notified, protocols initiated.

## 2022-08-16 NOTE — Group Note (Signed)
Recreation Therapy Group Note   Group Topic:Team Building  Group Date: 08/16/2022 Start Time: 0935 End Time: 1015 Facilitators: Gianny Sabino-McCall, LRT,CTRS Location: 300 Hall Dayroom   Goal Area(s) Addresses:  Patient will effectively work with peer towards shared goal.  Patient will identify skills used to make activity successful.  Patient will identify how skills used during activity can be used to reach post d/c goals.   Group Description: Straw Bridge. In teams of 3-5, patients were given 15 plastic drinking straws and an equal length of masking tape. Using the materials provided, patients were instructed to build a free standing bridge-like structure to suspend an everyday item (ex: puzzle box) off of the floor or table surface. All materials were required to be used by the team in their design. LRT facilitated post-activity discussion reviewing team process. Patients were encouraged to reflect how the skills used in this activity can be generalized to daily life post discharge.    Affect/Mood: Appropriate   Participation Level: Minimal   Participation Quality: Independent   Behavior: Appropriate   Speech/Thought Process: Focused   Insight: Good   Judgement: Good   Modes of Intervention: STEM Activity   Patient Response to Interventions:  Engaged   Education Outcome:  Acknowledges education   Clinical Observations/Individualized Feedback: Pt participated and engaged in group. Pt was called out of group and did not return.     Plan: Continue to engage patient in RT group sessions 2-3x/week.   Berit Raczkowski-McCall, LRT,CTRS 08/16/2022 12:16 PM

## 2022-08-16 NOTE — Progress Notes (Signed)
[facility] MD Progress Note  08/16/2022 3:48 PM [name]  MRN:  [id number] Subjective:   [name] is a 22 yr old female who presented to [facility] on 7/18 after a Suicide Attempt Via OD (~20 5 mg tablets of Abilify), she was admitted to [facility] on 7/20.  PPHx is significant for Bipolar Disorder, Borderline Personality Disorder, GAD, and PTSD, and 7 Prior Suicide Attempts (all OD) and ~11 Psychiatric Hospitalizations [facility] 03/2022).   Case was discussed in the multidisciplinary team. MAR was reviewed and patient was compliant with medications.  She received PRN Tylenol, Robaxin, and Trazodone yesterday.  She did have a fall this morning due to low BP.   Psychiatric Team made the following recommendations yesterday: -Continue to hold Abilify     On interview today patient reports she slept good last night.  She reports her appetite is doing fair.  She reports no SI, HI, or AVH.  She reports no Paranoia or Ideas of Reference.  She reports no issues with her medications.  She reports having significant withdrawal symptoms- Dizziness, sweats, chills, nausea, vomiting, and diarrhea.  Encouraged her to increase her fluid intake to help with her low BP.  She reports she no longer has HI towards her ex-bf because she knows that if she did that then she would become like him and she won't do that.  She reports she is also interested in DBT therapy once she is discharged.  Discussed with her that her RPR was non reactive.  She reports having white thick vaginal discharge.  Discussed we would start treatment and she was agreeable.  She reports no other concerns at present.  Principal Problem: Bipolar I disorder, most recent episode mixed (HCC) Diagnosis: Principal Problem:   Bipolar I disorder, most recent episode mixed (HCC) Active Problems:   Borderline personality disorder (HCC)   Generalized anxiety disorder   PTSD (post-traumatic stress disorder)   Tobacco use disorder   Panic disorder   Cannabis use  disorder   Stimulant use disorder   Opioid use disorder  Total Time spent with patient:  I personally spent 35 minutes on the unit in direct patient care. The direct patient care time included face-to-face time with the patient, reviewing the patient's chart, communicating with other professionals, and coordinating care. Greater than 50% of this time was spent in counseling or coordinating care with the patient regarding goals of hospitalization, psycho-education, and discharge planning needs.   Past Psychiatric History: Bipolar Disorder, Borderline Personality Disorder, GAD, and PTSD, and 7 Prior Suicide Attempts (all OD) and ~11 Psychiatric Hospitalizations [facility] 03/2022).  Past Medical History:  Past Medical History:  Diagnosis Date   Anxiety    Asthma    Headache(784.0)    Hx of suicide attempt    Major depressive disorder    Morbid obesity (HCC) 03/25/2020   PTSD (post-traumatic stress disorder)     Past Surgical History:  Procedure Laterality Date   NO PAST SURGERIES     wisdom tooth extraction     Family History:  Family History  Problem Relation Age of Onset   Diabetes Maternal Aunt    Diabetes Maternal Grandmother    Cancer Maternal Grandmother    Breast cancer Maternal Grandmother    Breast cancer Other    Family Psychiatric  History: Maternal Grandmother- Unknown Psychiatric Diagnosis No Known Suicides   Social History:  Social History   Substance and Sexual Activity  Alcohol Use Not Currently     Social History  Substance and Sexual Activity  Drug Use Not Currently   Types: Marijuana, MDMA (Ecstacy), Cocaine   Comment: Coc-last use 2/27; marijuana and MDMA last use 2/28    Social History   Socioeconomic History   Marital status: Single    Spouse name: Not on file   Number of children: 2   Years of education: Not on file   Highest education level: Some college, no degree  Occupational History   Not on file  Tobacco Use   Smoking status: Every Day     Current packs/day: 0.50    Average packs/day: 0.5 packs/day for 4.0 years (2.0 ttl pk-yrs)    Types: Cigarettes, Cigars    Passive exposure: Past   Smokeless tobacco: Never   Tobacco comments:    black and mild with THC  Vaping Use   Vaping status: Never Used  Substance and Sexual Activity   Alcohol use: Not Currently   Drug use: Not Currently    Types: Marijuana, MDMA (Ecstacy), Cocaine    Comment: Coc-last use 2/27; marijuana and MDMA last use 2/28   Sexual activity: Yes    Partners: Male    Birth control/protection: None  Other Topics Concern   Not on file  Social History Narrative   Pt and family are homeless. 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   Social Determinants of Health   Financial Resource Strain: Not on file  Food Insecurity: No Food Insecurity (08/13/2022)   Hunger Vital Sign    Worried About Running Out of Food in the Last Year: Never true    Ran Out of Food in the Last Year: Never true  Transportation Needs: No Transportation Needs (08/13/2022)   PRAPARE - Administrator, Civil Service (Medical): No    Lack of Transportation (Non-Medical): No  Physical Activity: Not on file  Stress: Not on file  Social Connections: Unknown (12/21/2021)   Received from [facility]   Social Connections    Frequency of Communication with Friends and Family: Not asked    Frequency of Social Gatherings with Friends and Family: Not asked   Additional Social History:                         Sleep: Good  Appetite:  Fair  Current Medications: Current Facility-Administered Medications  Medication Dose Route Frequency Provider Last Rate Last Admin   acetaminophen (TYLENOL) tablet 650 mg  650 mg Oral Q6H PRN Augusto Gamble, MD   650 mg at 08/15/22 1219   alum & mag hydroxide-simeth (MAALOX/MYLANTA)  200-200-20 MG/5ML suspension 30 mL  30 mL Oral Q4H PRN Augusto Gamble, MD       bismuth subsalicylate (PEPTO BISMOL) chewable tablet 524 mg  524 mg Oral Q3H PRN Augusto Gamble, MD       cloNIDine (CATAPRES) tablet 0.1 mg  0.1 mg Oral BID Augusto Gamble, MD       Followed by   Melene Muller ON 08/17/2022] cloNIDine (CATAPRES) tablet 0.1 mg  0.1 mg Oral Once Augusto Gamble, MD       dicyclomine (BENTYL) tablet 20 mg  20 mg Oral Q6H PRN Augusto Gamble, MD       diphenhydrAMINE (BENADRYL) capsule 50 mg  50 mg Oral TID PRN Motley-Mangrum, Geralynn Ochs A,  PMHNP       Or   diphenhydrAMINE (BENADRYL) injection 50 mg  50 mg Intramuscular TID PRN Motley-Mangrum, Geralynn Ochs A, PMHNP       escitalopram (LEXAPRO) tablet 10 mg  10 mg Oral Daily Motley-Mangrum, Jadeka A, PMHNP   10 mg at 08/16/22 0840   haloperidol (HALDOL) tablet 5 mg  5 mg Oral TID PRN Motley-Mangrum, Jadeka A, PMHNP       Or   haloperidol lactate (HALDOL) injection 5 mg  5 mg Intramuscular TID PRN Motley-Mangrum, Geralynn Ochs A, PMHNP       hydrOXYzine (ATARAX) tablet 25 mg  25 mg Oral TID PRN Augusto Gamble, MD   25 mg at 08/14/22 1214   LORazepam (ATIVAN) tablet 2 mg  2 mg Oral TID PRN Motley-Mangrum, Geralynn Ochs A, PMHNP       Or   LORazepam (ATIVAN) injection 2 mg  2 mg Intramuscular TID PRN Motley-Mangrum, Jadeka A, PMHNP       methocarbamol (ROBAXIN) tablet 500 mg  500 mg Oral Q8H PRN Augusto Gamble, MD   500 mg at 08/15/22 2123   naproxen (NAPROSYN) tablet 500 mg  500 mg Oral BID PRN Augusto Gamble, MD   500 mg at 08/15/22 0805   nicotine (NICODERM CQ - dosed in mg/24 hours) patch 21 mg  21 mg Transdermal Daily Massengill, Harrold Donath, MD   21 mg at 08/16/22 0840   nicotine polacrilex (NICORETTE) gum 2 mg  2 mg Oral PRN Augusto Gamble, MD       ondansetron [facility]) tablet 8 mg  8 mg Oral Q8H PRN Augusto Gamble, MD   8 mg at 08/15/22 0805   polyethylene glycol (MIRALAX / GLYCOLAX) packet 17 g  17 g Oral Daily PRN Augusto Gamble, MD       prazosin (MINIPRESS) capsule 1 mg  1 mg Oral QHS  Motley-Mangrum, Jadeka A, PMHNP   1 mg at 08/15/22 2123   senna (SENOKOT) tablet 8.6 mg  1 tablet Oral QHS PRN Augusto Gamble, MD       traZODone (DESYREL) tablet 50 mg  50 mg Oral QHS PRN Motley-Mangrum, Jadeka A, PMHNP   50 mg at 08/15/22 2123    Lab Results:  Results for orders placed or performed during the hospital encounter of 08/13/22 (from the past 48 hour(s))  Hemoglobin A1c     Status: None   Collection Time: 08/15/22  6:26 AM  Result Value Ref Range   Hgb A1c MFr Bld 5.2 4.8 - 5.6 %    Comment: (NOTE) Pre diabetes:          5.7%-6.4%  Diabetes:              >6.4%  Glycemic control for   <7.0% adults with diabetes    Mean Plasma Glucose 102.54 mg/dL    Comment: Performed at [facility], [address] [place], [place] [address]  Lipid panel     Status: Abnormal   Collection Time: 08/15/22  6:26 AM  Result Value Ref Range   Cholesterol 155 0 - 200 mg/dL   Triglycerides 50 <952 mg/dL   HDL 44 >84 mg/dL   Total CHOL/HDL Ratio 3.5 RATIO   VLDL 10 0 - 40 mg/dL   LDL Cholesterol 132 (H) 0 - 99 mg/dL    Comment:        Total Cholesterol/HDL:CHD Risk Coronary Heart Disease Risk Table  Men   Women  1/2 Average Risk   3.4   3.3  Average Risk       5.0   4.4  2 X Average Risk   9.6   7.1  3 X Average Risk  23.4   11.0        Use the calculated Patient Ratio above and the CHD Risk Table to determine the patient's CHD Risk.        ATP III CLASSIFICATION (LDL):  <100     mg/dL   Optimal  161-096  mg/dL   Near or Above                    Optimal  130-159  mg/dL   Borderline  045-409  mg/dL   High  >811     mg/dL   Very High Performed at [facility], [address], [place], [place] [address]   TSH     Status: None   Collection Time: 08/15/22  6:26 AM  Result Value Ref Range   TSH 0.391 0.350 - 4.500 uIU/mL    Comment: Performed by a 3rd Generation assay with a functional sensitivity of <=0.01 uIU/mL. Performed at [facility], [address], [place], [place] [address]   Rapid HIV screen (HIV 1/2 Ab+Ag)     Status: None   Collection Time: 08/15/22  6:26 AM  Result Value Ref Range   HIV-1 P24 Antigen - HIV24 NON REACTIVE NON REACTIVE    Comment: (NOTE) Detection of p24 may be inhibited by biotin in the sample, causing false negative results in acute infection.    HIV 1/2 Antibodies NON REACTIVE NON REACTIVE   Interpretation (HIV Ag Ab)      A non reactive test result means that HIV 1 or HIV 2 antibodies and HIV 1 p24 antigen were not detected in the specimen.    Comment: Performed at [facility], [address] [place], [place] [address]  Hepatitis panel, acute     Status: None   Collection Time: 08/15/22  6:26 AM  Result Value Ref Range   Hepatitis B Surface Ag NON REACTIVE NON REACTIVE   HCV Ab NON REACTIVE NON REACTIVE    Comment: (NOTE) Nonreactive HCV antibody screen is consistent with no HCV infections,  unless recent infection is suspected or other evidence exists to indicate HCV infection.     Hep A IgM NON REACTIVE NON REACTIVE   Hep B C IgM NON REACTIVE NON REACTIVE    Comment: Performed at [facility], [address] [place], [place] [address]    Blood Alcohol level:  Lab Results  Component Value Date   ETH <10 08/12/2022   ETH <10 06/03/2021    Metabolic Disorder Labs: Lab Results  Component Value Date   HGBA1C 5.2 08/15/2022   MPG 102.54 08/15/2022   MPG 108 04/13/2022   Lab Results  Component Value Date   PROLACTIN 6.6 12/28/2018   PROLACTIN 5.8 08/23/2017   Lab Results  Component Value Date   CHOL 155 08/15/2022   TRIG 50 08/15/2022   HDL 44 08/15/2022   CHOLHDL 3.5 08/15/2022   VLDL 10 08/15/2022   LDLCALC 101 (H) 08/15/2022   LDLCALC 114 (H) 04/13/2022    Physical Findings: AIMS:  , ,  ,  ,    CIWA:    COWS:  COWS Total Score: 0  Musculoskeletal: Strength & Muscle Tone: within normal limits  Gait &  Station: normal Patient leans: N/A  Psychiatric Specialty Exam:  Presentation  General Appearance:  Appropriate for Environment; Casual  Eye Contact: Good  Speech: Clear and Coherent; Normal Rate  Speech Volume: Normal  Handedness: Right   Mood and Affect  Mood: -- ("better")  Affect: Congruent   Thought Process  Thought Processes: Coherent; Goal Directed  Descriptions of Associations:Intact  Orientation:Full (Time, Place and Person)  Thought Content:Logical; WDL  History of Schizophrenia/Schizoaffective disorder:No  Duration of Psychotic Symptoms:N/A  Hallucinations:Hallucinations: None  Ideas of Reference:None  Suicidal Thoughts:Suicidal Thoughts: No  Homicidal Thoughts:Homicidal Thoughts: No HI Active Intent and/or Plan: Without Intent   Sensorium  Memory: Immediate Good; Recent Good  Judgment: Good  Insight: Good   Executive Functions  Concentration: Good  Attention Span: Good  Recall: Good  Fund of Knowledge: Good  Language: Good   Psychomotor Activity  Psychomotor Activity: Psychomotor Activity: Normal   Assets  Assets: Communication Skills; Desire for Improvement; Resilience; Social Support; Physical Health   Sleep  Sleep: Sleep: Good    Physical Exam: Physical Exam Vitals and nursing note reviewed.  Constitutional:      General: She is not in acute distress.    Appearance: Normal appearance. She is normal weight. She is not ill-appearing or toxic-appearing.  HENT:     Head: Normocephalic and atraumatic.  Pulmonary:     Effort: Pulmonary effort is normal.  Musculoskeletal:        General: Normal range of motion.  Neurological:     General: No focal deficit present.     Mental Status: She is alert.    Review of Systems  Constitutional:  Positive for chills and diaphoresis.  Respiratory:  Negative for cough and shortness of breath.   Cardiovascular:  Negative for chest pain.  Gastrointestinal:   Positive for diarrhea, nausea and vomiting. Negative for abdominal pain and constipation.  Neurological:  Positive for dizziness, weakness and headaches.  Psychiatric/Behavioral:  Negative for depression, hallucinations and suicidal ideas. The patient is not nervous/anxious.    Blood pressure 106/67, pulse (!) 58, temperature 98.2 F (36.8 C), temperature source Oral, resp. rate 16, height 5\' 8"  (1.727 m), weight 98.9 kg, SpO2 100%. Body mass index is 33.15 kg/m.   Treatment Plan Summary: Daily contact with patient to assess and evaluate symptoms and progress in treatment and Medication management  [name] is a 22 yr old female who presented to [facility] on 7/18 after a Suicide Attempt Via OD (~20 5 mg tablets of Abilify), she was admitted to [facility] on 7/20.  PPHx is significant for Bipolar Disorder, Borderline Personality Disorder, GAD, and PTSD, and 7 Prior Suicide Attempts (all OD) and ~11 Psychiatric Hospitalizations [facility] 03/2022).   [name] is having some vitals instability from withdrawal and poor oral intake.  She will focus on increasing her fluid intake.  Since she is reporting thick white discharge we will give a one time dose of Fluconazole.  We will not make any other changes to her medications at this time.  Could consider restarting Abilify tomorrow.  We will continue to monitor.    Bipolar Disorder, current episode Mixed  GAD  PTSD  Panic Disorder: -Continue Lexapro 10 mg daily for depression -Continue holding Abilify in setting of overdose -Continue Prazosin 1 mg QHS for nightmares -Continue Agitation Protocol: Haldol/Ativan/Benadryl   Withdrawal: -Continue CIWA, last score= 0  @ 0000 7/22 -Continue COWS, last score @COWS @ -Continue Clonidine taper to end on 7/23 -Continue Robaxin 500 mg q8  PRN muscle spasms -Continue Naproxen 500 mg BID PRN pain -Continue Zofran-ODT 4 mg q6 PRN nausea -Continue Bentyl 20 mg q6 PRN spasms   Nicotine Dependence: -Continue Nicotine Patch  21 mg daily -Continue Nicotine Gum 2 mg PRN   Yeast Infection: -One dose of Fluconazole 150 mg    -Continue PRN's: Tylenol, Maalox, Atarax, Milk of Magnesia, Trazodone, Pepto Bismol, Miralax, Senokot    Lauro Franklin, MD 08/16/2022, 3:48 PM

## 2022-08-17 DIAGNOSIS — R519 Headache, unspecified: Secondary | ICD-10-CM | POA: Insufficient documentation

## 2022-08-17 MED ORDER — ARIPIPRAZOLE 5 MG PO TABS
2.5000 mg | ORAL_TABLET | Freq: Two times a day (BID) | ORAL | Status: AC
  Administered 2022-08-18 (×2): 2.5 mg via ORAL
  Filled 2022-08-17 (×3): qty 1

## 2022-08-17 MED ORDER — ARIPIPRAZOLE 2 MG PO TABS
2.0000 mg | ORAL_TABLET | Freq: Two times a day (BID) | ORAL | Status: AC
  Administered 2022-08-17 (×2): 2 mg via ORAL
  Filled 2022-08-17 (×3): qty 1

## 2022-08-17 MED ORDER — ARIPIPRAZOLE 5 MG PO TABS
5.0000 mg | ORAL_TABLET | Freq: Every day | ORAL | Status: AC
  Administered 2022-08-19: 5 mg via ORAL
  Filled 2022-08-17: qty 1

## 2022-08-17 MED ORDER — IBUPROFEN 400 MG PO TABS
400.0000 mg | ORAL_TABLET | Freq: Four times a day (QID) | ORAL | Status: DC | PRN
  Administered 2022-08-17: 400 mg via ORAL
  Filled 2022-08-17: qty 1

## 2022-08-17 NOTE — Progress Notes (Signed)
   08/16/22 2200  Psych Admission Type (Psych Patients Only)  Admission Status Involuntary  Psychosocial Assessment  Patient Complaints Anxiety  Eye Contact Fair  Facial Expression Anxious;Worried  Affect Anxious  Surveyor, minerals Activity Slow  Appearance/Hygiene Unremarkable  Behavior Characteristics Anxious  Mood Depressed  Thought Process  Coherency WDL  Content WDL  Delusions None reported or observed  Perception WDL  Hallucination None reported or observed  Judgment Poor  Confusion None  Danger to Self  Current suicidal ideation? Denies  Danger to Others  Danger to Others None reported or observed
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
## 2022-08-17 NOTE — Plan of Care (Signed)
  Problem: Education: Goal: Emotional status will improve Outcome: Progressing Goal: Mental status will improve Outcome: Progressing   Problem: Activity: Goal: Interest or engagement in activities will improve Outcome: Progressing   Problem: Coping: Goal: Coping ability will improve Outcome: Progressing   

## 2022-08-17 NOTE — [facility] Group Notes (Signed)
Adult Psychoeducational Group Note  Date:  08/17/2022 Time:  6:14 PM  Group Topic/Focus:  Goals Group:   The focus of this group is to help patients establish daily goals to achieve during treatment and discuss how the patient can incorporate goal setting into their daily lives to aide in recovery. Orientation:   The focus of this group is to educate the patient on the purpose and policies of crisis stabilization and provide a format to answer questions about their admission.  The group details unit policies and expectations of patients while admitted.  Participation Level:  Active  Participation Quality:  Appropriate  Affect:  Appropriate  Cognitive:  Appropriate  Insight: Appropriate  Engagement in Group:  Engaged  Modes of Intervention:  Discussion  Additional Comments:  Pt stated that her goal for today is to prepare for her discharge.  Burlene Arnt 08/17/2022, 6:14 PM

## 2022-08-17 NOTE — [facility] Group Notes (Signed)
[facility] Group Notes:  (Nursing/MHT/Case Management/Adjunct)  Date:  08/17/2022  Time:  10:29 PM  Type of Therapy:   Wrap-up group  Participation Level:  Active  Participation Quality:  Appropriate  Affect:  Appropriate  Cognitive:  Appropriate  Insight:  Appropriate  Engagement in Group:  Engaged  Modes of Intervention:  Education  Summary of Progress/Problems Pt goal to work on D/C. Day 8/10.  [name] 08/17/2022, 10:29 PM
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
## 2022-08-17 NOTE — Group Note (Signed)
LCSW Group Therapy Note  Group Date: 08/17/2022 Start Time: 1100 End Time: 1145   Type of Therapy and Topic:  Group Therapy - Healthy vs Unhealthy Coping Skills  Participation Level:  Active   Description of Group The focus of this group was to determine what unhealthy coping techniques typically are used by group members and what healthy coping techniques would be helpful in coping with various problems. Patients were guided in becoming aware of the differences between healthy and unhealthy coping techniques. Patients were asked to identify 2-3 healthy coping skills they would like to learn to use more effectively.  Therapeutic Goals Patients learned that coping is what human beings do all day long to deal with various situations in their lives Patients defined and discussed healthy vs unhealthy coping techniques Patients identified their preferred coping techniques and identified whether these were healthy or unhealthy Patients determined 2-3 healthy coping skills they would like to become more familiar with and use more often. Patients provided support and ideas to each other   Summary of Patient Progress:  During group, [name] expressed interes. Patient proved open to input from peers and feedback from CSW. Patient demonstrated positive insight into the subject matter, was respectful of peers, and participated throughout the entire session.   Therapeutic Modalities Cognitive Behavioral Therapy Motivational Interviewing  Starleen Arms, Theresia Majors 08/17/2022  1:23 PM

## 2022-08-17 NOTE — Plan of Care (Signed)
  Problem: Education: Goal: Knowledge of [facility] Education information/materials will improve Outcome: Progressing Goal: Emotional status will improve Outcome: Progressing   Problem: Activity: Goal: Interest or engagement in activities will improve Outcome: Progressing   Problem: Health Behavior/Discharge Planning: Goal: Identification of resources available to assist in meeting health care needs will improve Outcome: Progressing   Problem: Physical Regulation: Goal: Ability to maintain clinical measurements within normal limits will improve Outcome: Progressing

## 2022-08-17 NOTE — Progress Notes (Addendum)
Pt denied SI/HI/AVH this morning. Pt rated her depression a 4/10, anxiety a 0/10, and feelings of hopelessness a 0/10. Pt refused scheduled doses of clonidine due to it making her "feel dizzy and fall yesterday". Pt complains of pain in the left side of her head, complaining that she has a concussion. PRN Ibuprofen administered per MAR. Pt has been pleasant, calm, and cooperative throughout the shift. RN provided support and encouragement to patient. Pt given scheduled medications as prescribed. Q15 min checks verified for safety. Patient verbally contracts for safety. Patient compliant with medications and treatment plan. Patient is interacting well on the unit. Pt is safe on the unit.   08/17/22 1000  Psych Admission Type (Psych Patients Only)  Admission Status Involuntary  Psychosocial Assessment  Patient Complaints Depression  Eye Contact Fair  Facial Expression Animated  Affect Depressed  Speech Logical/coherent  Interaction Assertive  Motor Activity Slow  Appearance/Hygiene Unremarkable  Behavior Characteristics Appropriate to situation;Cooperative  Mood Depressed  Thought Process  Coherency WDL  Content WDL  Delusions None reported or observed  Perception WDL  Hallucination None reported or observed  Judgment Impaired  Confusion None  Danger to Self  Current suicidal ideation? Denies  Danger to Others  Danger to Others None reported or observed

## 2022-08-17 NOTE — Progress Notes (Addendum)
[facility] LCSW Progress Note   08/17/2022    11:32 AM  Rossi Fant      Type of Note: Visit wit Patient / Resources provided for Clothes   CSW spoke with patient today and informed her that [facility] stated that they already sent her belongings to a thrift store since she has been gone for 2 months. Patient said that it has not been that long and was a little upset because that was all her stuff ( shoes, flat irons, and more) . However, CSW informed her that [facility] stated that they will give her a clothing Volcher to use at there thrift store to get free clothes. Patient said that she will call them back to get that arranged. Also, patient was able to provide her sister contact number so safety planning could be done. Patient states that she is going to stay with her sister once she DC.    CSW did provide patient with a resource list of alternative clothing banks around [place] area and where her sister lives since she said that she is going to go stay with her.     Signed:   Jacob Moores, MSW, [facility] 08/17/2022 11:32 AM

## 2022-08-17 NOTE — [facility] Suicide Risk Assessment (Signed)
[facility] INPATIENT:  Family/Significant Other Suicide Prevention Education  Suicide Prevention Education:  Education Completed; [name] ( Sister) [phone],  (name of family member/significant other) has been identified by the patient as the family member/significant other with whom the patient will be residing, and identified as the person(s) who will aid the patient in the event of a mental health crisis (suicidal ideations/suicide attempt).  With written consent from the patient, the family member/significant other has been provided the following suicide prevention education, prior to the and/or following the discharge of the patient.   Safety planning was completed with sister. Sister shared that patient would be staying with here and confirmed that patient does not have access or own any guns or weapons. Sister will also monitor and make sure patient is taking her medications appropriately.   The suicide prevention education provided includes the following: Suicide risk factors Suicide prevention and interventions National Suicide Hotline telephone number [facility] assessment telephone number [facility] Emergency Assistance [facility] and/or Residential Mobile Crisis Unit telephone number  Request made of family/significant other to: Remove weapons (e.g., guns, rifles, knives), all items previously/currently identified as safety concern.   Remove drugs/medications (over-the-counter, prescriptions, illicit drugs), all items previously/currently identified as a safety concern.  The family member/significant other verbalizes understanding of the suicide prevention education information provided.  The family member/significant other agrees to remove the items of safety concern listed above.  [name] 08/17/2022, 2:24 PM

## 2022-08-17 NOTE — Group Note (Signed)
Recreation Therapy Group Note   Group Topic:Animal Assisted Therapy   Group Date: 08/17/2022 Start Time: 5366 End Time: 1034 Facilitators: Naba Sneed-McCall, LRT,CTRS Location: [address] Dayroom   Animal-Assisted Activity (AAA) Program Checklist/Progress Notes Patient Eligibility Criteria Checklist & Daily Group note for Rec Tx Intervention  AAA/T Program Assumption of Risk Form signed by Patient/ or Parent Legal Guardian Yes  Patient is free of allergies or severe asthma Yes  Patient reports no fear of animals Yes  Patient reports no history of cruelty to animals Yes  Patient understands his/her participation is voluntary Yes  Patient washes hands before animal contact Yes  Patient washes hands after animal contact Yes   Affect/Mood: Appropriate   Participation Level: Engaged   Participation Quality: Independent   Behavior: Appropriate   Speech/Thought Process: Focused   Insight: Good   Judgement: Good   Modes of Intervention: Teaching laboratory technician   Patient Response to Interventions:  Engaged   Education Outcome:  Acknowledges education   Clinical Observations/Individualized Feedback: Patient attended session and interacted appropriately with therapy dog and peers. Patient asked appropriate questions about therapy dog and his training. Patient shared stories about their pets at home with group.    Plan: Continue to engage patient in RT group sessions 2-3x/week.   Maree Ainley-McCall, LRT,CTRS  08/17/2022 1:01 PM

## 2022-08-18 MED ORDER — MENTHOL 3 MG MT LOZG
1.0000 | LOZENGE | OROMUCOSAL | Status: DC | PRN
  Administered 2022-08-18 – 2022-08-19 (×3): 3 mg via ORAL
  Filled 2022-08-18 (×3): qty 9

## 2022-08-18 NOTE — Progress Notes (Signed)
   08/17/22 2144  Psych Admission Type (Psych Patients Only)  Admission Status Involuntary  Psychosocial Assessment  Patient Complaints Anxiety  Eye Contact Fair  Facial Expression Animated  Affect Appropriate to circumstance  Speech Logical/coherent  Interaction Assertive  Motor Activity Other (Comment) (WNL)  Appearance/Hygiene Unremarkable  Behavior Characteristics Cooperative;Appropriate to situation  Mood Anxious  Thought Process  Coherency WDL  Content WDL  Delusions None reported or observed  Perception WDL  Hallucination None reported or observed  Judgment Impaired  Confusion None  Danger to Self  Current suicidal ideation? Denies  Danger to Others  Danger to Others None reported or observed   Pt was offered support and encouragement. Pt was given scheduled medications. Given PRN Hydroxyzine and PRN Trazodone per MAR. Q 15 minute checks were done for safety. Pt attended group and interacts well with peers and staff. Pt has no complaints.Pt receptive to treatment and safety maintained on unit.

## 2022-08-18 NOTE — Progress Notes (Signed)
   08/18/22 0600  15 Minute Checks  Location Bedroom  Visual Appearance Calm  Behavior Composed  Sleep (Behavioral Health Patients Only)  Calculate sleep? (Click Yes once per 24 hr at 0600 safety check) Yes  Documented sleep last 24 hours 6.5

## 2022-08-18 NOTE — BHH Group Notes (Signed)
Adult Psychoeducational Group Note  Date:  08/18/2022 Time:  1:39 PM  Group Topic/Focus:  Goals Group:   The focus of this group is to help patients establish daily goals to achieve during treatment and discuss how the patient can incorporate goal setting into their daily lives to aide in recovery. Orientation:   The focus of this group is to educate the patient on the purpose and policies of crisis stabilization and provide a format to answer questions about their admission.  The group details unit policies and expectations of patients while admitted.  Participation Level:  Active  Participation Quality:  Appropriate  Affect:  Appropriate  Cognitive:  Appropriate  Insight: Appropriate  Engagement in Group:  Engaged  Modes of Intervention:  Discussion  Additional Comments:  Pt stated that her goal is to accept the change in her discharge date. She is going to practice being patient.   Burlene Arnt 08/18/2022, 1:39 PM

## 2022-08-18 NOTE — Plan of Care (Signed)
  Problem: Activity: Goal: Interest or engagement in activities will improve Outcome: Progressing   Problem: Coping: Goal: Ability to verbalize frustrations and anger appropriately will improve Outcome: Progressing   Problem: Health Behavior/Discharge Planning: Goal: Identification of resources available to assist in meeting health care needs will improve Outcome: Progressing   Problem: Safety: Goal: Periods of time without injury will increase Outcome: Progressing

## 2022-08-18 NOTE — Group Note (Signed)
Recreation Therapy Group Note   Group Topic:Other  Group Date: 08/18/2022 Start Time: 1305 End Time: 1448 Facilitators: Dayne Chait-McCall, LRT,CTRS Location: [address] Dayroom   Activity Description/Intervention: Therapeutic Drumming. Patients with peers and staff were given the opportunity to engage in a leader facilitated [facility] Group Empowerment Drumming Circle with staff from the [employer], in partnership with [employer]. Teaching laboratory technician and trained [employer], Theodoro Doing leading with LRT observing and documenting intervention and pt response. This evidenced-based practice targets 7 areas of health and wellbeing in the human experience including: stress-reduction, exercise, self-expression, camaraderie/support, nurturing, spirituality, and music-making (leisure).   Goal Area(s) Addresses:  Patient will engage in pro-social way in music group.  Patient will follow directions of drum leader on the first prompt. Patient will demonstrate no behavioral issues during group.  Patient will identify if a reduction in stress level occurs as a result of participation in therapeutic drum circle.    Education: Leisure exposure, Pharmacologist, Musical expression, Discharge Planning   Affect/Mood: N/A   Participation Level: Did not attend    Clinical Observations/Individualized Feedback:     Plan: Continue to engage patient in RT group sessions 2-3x/week.   Romon Devereux-McCall, LRT,CTRS 08/18/2022 3:55 PM
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
## 2022-08-18 NOTE — Plan of Care (Signed)
  Problem: Education: Goal: Emotional status will improve Outcome: Progressing Goal: Mental status will improve Outcome: Progressing   Problem: Activity: Goal: Interest or engagement in activities will improve Outcome: Progressing Goal: Sleeping patterns will improve Outcome: Progressing   Problem: Coping: Goal: Ability to verbalize frustrations and anger appropriately will improve Outcome: Progressing

## 2022-08-18 NOTE — BHH Group Notes (Signed)
Spiritual care group facilitated by Chaplain Dyanne Carrel, [facility]  Group focused on topic of strength. Group members reflected on what thoughts and feelings emerge when they hear this topic. They then engaged in facilitated dialog around how strength is present in their lives. This dialog focused on representing what strength had been to them in their lives (images and patterns given) and what they saw as helpful in their life now (what they needed / wanted).  Activity drew on narrative framework.  Patient Progress: [name] attended group and actively engaged and participated in group conversation and activities.  She shared about how her own definitions of strength have changed over time and about times when she drew on her strength.

## 2022-08-18 NOTE — Progress Notes (Signed)
Pt denied SI/HI/AVH this morning. Pt rated her depression a 0/10, anxiety a 0/10, and feelings of hopelessness a 0/10. Pt reports that she slept "good" last night. Patient became visibly upset this morning following the news that she is not discharging today. Patient called 911 in the dayroom in an attempt to leave. Patient calmed down following support and encouragement from this RN. PRN Tylenol and Hydroxyzine administered per [facility] for chest pain and anxiety. Patient remained calm and cooperative for the rest of the shift. Pt given scheduled medications as prescribed. Q15 min checks verified for safety. Patient verbally contracts for safety. Patient compliant with medications and treatment plan. Patient is interacting well on the unit. Pt is safe on the unit.   08/18/22 1200  Psych Admission Type (Psych Patients Only)  Admission Status Involuntary  Psychosocial Assessment  Patient Complaints Anxiety;Depression  Eye Contact Fair  Facial Expression Animated  Affect Appropriate to circumstance  Speech Logical/coherent  Interaction Assertive;Needy  Motor Activity Other (Comment) (WDL)  Appearance/Hygiene Unremarkable  Behavior Characteristics Cooperative;Appropriate to situation;Anxious  Mood Anxious;Sad  Thought Process  Coherency WDL  Content WDL  Delusions None reported or observed  Perception WDL  Hallucination None reported or observed  Judgment Impaired  Confusion None  Danger to Self  Current suicidal ideation? Denies  Danger to Others  Danger to Others None reported or observed

## 2022-08-18 NOTE — [facility] Group Notes (Signed)
[facility] Group Notes:  (Nursing/MHT/Case Management/Adjunct)  Date:  08/18/2022  Time:  2000 Type of Therapy:   Narcotics Anonymous Meeting  Participation Level:  Active  Participation Quality:  Appropriate, Attentive, Sharing, and Supportive  Affect:  Appropriate and Tearful  Cognitive:  Alert  Insight:  Improving  Engagement in Group:  Engaged  Modes of Intervention:  Education and Support  Summary of Progress/Problems:  [name] 08/18/2022, 10:08 PM

## 2022-08-19 MED ORDER — PRAZOSIN HCL 1 MG PO CAPS: 1.0000 mg | ORAL_CAPSULE | Freq: Every day | ORAL | 0 refills | Status: DC

## 2022-08-19 MED ORDER — HYDROXYZINE HCL 25 MG PO TABS: 25.0000 mg | ORAL_TABLET | Freq: Three times a day (TID) | ORAL | 0 refills | Status: AC | PRN

## 2022-08-19 MED ORDER — TRAZODONE HCL 50 MG PO TABS: 50.0000 mg | ORAL_TABLET | Freq: Every evening | ORAL | 0 refills | Status: DC | PRN

## 2022-08-19 MED ORDER — ESCITALOPRAM OXALATE 10 MG PO TABS: 10.0000 mg | ORAL_TABLET | Freq: Every day | ORAL | 0 refills | Status: DC

## 2022-08-19 MED ORDER — ARIPIPRAZOLE 5 MG PO TABS: 5.0000 mg | ORAL_TABLET | Freq: Every day | ORAL | 0 refills | Status: DC

## 2022-08-19 MED ORDER — HYDROXYZINE HCL 10 MG PO TABS: 10.0000 mg | ORAL_TABLET | Freq: Three times a day (TID) | ORAL | 0 refills | Status: DC | PRN

## 2022-08-19 MED ORDER — NICOTINE 21 MG/24HR TD PT24: 21.0000 mg | MEDICATED_PATCH | Freq: Every day | TRANSDERMAL | 0 refills | Status: AC

## 2022-08-19 NOTE — Progress Notes (Signed)
Pt discharged at this time. Pt left facility with family. Pt removed all belongings, and verbalized understanding of medications and discharge instructions . Pt denies SI/HI/AVH.

## 2022-08-19 NOTE — Progress Notes (Signed)
   08/19/22 0601  15 Minute Checks  Location Bedroom  Visual Appearance Calm  Behavior Sleeping  Sleep (Behavioral Health Patients Only)  Calculate sleep? (Click Yes once per 24 hr at 0600 safety check) Yes  Documented sleep last 24 hours 7

## 2022-08-19 NOTE — Progress Notes (Signed)
  [facility] Adult Case Management Discharge Plan :  Will you be returning to the same living situation after discharge:  No. Patient will be discharging to sister house  At discharge, do you have transportation home?: Yes,  Patient sister will be picking her up  Do you have the ability to pay for your medications: Yes,  Patient has [facility] Tailored Plan   Release of information consent forms completed and in the chart;  Patient's signature needed at discharge.  Patient to Follow up at:  Follow-up Information     [facility] Follow up.   Specialty: Behavioral Health Why: Please go to this provider for medication management and/or therapy services on Monday through Friday, arrive by 7:00 am for an assessment. Contact information: [address] [place] [address] [phone]        [facility] [place]. Go on 08/26/2022.   Why: Please call to reschedule your therapy appointment from 08/26/22 at 11:00 am, in person. Contact information: [address] [place] [address] [phone]         [facility] Follow up.   Why: Please continue with this provider for suboxone services. Contact information: Text or Call: [phone] Email: [email or web address] Fax: [phone] Mailing Address: [address] [place], [place] [address]                Next level of care provider has access to [facility] Link:yes  Safety Planning and Suicide Prevention discussed: Robet Leu ( sister ) [phone]     Has patient been referred to the Quitline?: Patient refused referral for treatment. Stated that she will just use the patches prescribed because they are working  Patient has been referred for addiction treatment: No known substance use disorder.  Isabella Bowens, LCSWA 08/19/2022, 9:08 AM
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
## 2022-08-19 NOTE — Discharge Instructions (Signed)
Dear [name],  It was a pleasure to take care of you during your stay at [facility] where you were treated for your Bipolar I disorder, most recent episode mixed (HCC).  While you were here, you were:  observed and cared for by our nurses and nursing assistants  treated with medications by your psychiatrists  evaluated with imaging / lab tests, and treated with medicines / procedures by your doctors  provided individual and group therapy by therapists  provided resources by our social workers and case managers  Please review the medication list provided to you at discharge and stop, start taking, or continue taking the medications listed there.  You should also follow-up with your primary care doctor, or start seeing one if you don't have one yet. If applicable, here are some scheduled follow-ups for you:  Follow-up Information     [facility] Follow up.   Specialty: Behavioral Health Why: Please go to this provider for medication management and/or therapy services on Monday through Friday, arrive by 7:00 am for an assessment. Contact information: [address] [place] [address] [phone]        [facility] [place]. Go on 08/26/2022.   Why: Please call to reschedule your therapy appointment from 08/26/22 at 11:00 am, in person. Contact information: [address] [place] [address] [phone]         [facility] Follow up.   Why: Please continue with this provider for suboxone services. Contact information: Text or Call: [phone] Email: [email or web address] Fax: [phone] Mailing Address: [address] [place], [place] [address]                 I recommend abstinence from alcohol, tobacco, and other illicit drug use.   If your psychiatric symptoms or suicidal thoughts recur, worsen, or if you have side effects to your psychiatric medications, call your outpatient psychiatric  provider, 911, 988 or go to the nearest emergency department.  Take care!  Signed: Augusto Gamble, MD 08/19/2022, 7:31 AM  Naloxone (Narcan) can help reverse an overdose when given to the victim quickly.  [place] offers free naloxone kits and instructions/training on its use.  Add naloxone to your first aid kit and you can help save a life. A prescription can be filled at your local pharmacy or free kits are provided by the county.  Pick up your free kit at the following locations:   :  [facility], [address] [place] [address] [phone]) [facility] [address] [place] [id number] [phone]) [facility] [address] [place] [address]  [place]: [facility] [address] [place] [address] ([phone]) [facility] [address] [place] [address] [phone])

## 2022-08-19 NOTE — Plan of Care (Signed)
  Problem: Coping: Goal: Ability to verbalize frustrations and anger appropriately will improve Outcome: Progressing   Problem: Safety: Goal: Periods of time without injury will increase Outcome: Progressing   Problem: Activity: Goal: Interest or engagement in activities will improve Outcome: Completed/Met Goal: Sleeping patterns will improve Outcome: Completed/Met   Problem: Medication: Goal: Compliance with prescribed medication regimen will improve Outcome: Completed/Met

## 2022-08-19 NOTE — Progress Notes (Addendum)
   08/18/22 2000  Psych Admission Type (Psych Patients Only)  Admission Status Involuntary  Psychosocial Assessment  Patient Complaints None  Eye Contact Fair  Facial Expression Animated  Affect Appropriate to circumstance  Speech Logical/coherent  Interaction Assertive;Needy  Motor Activity Other (Comment) (WNL)  Appearance/Hygiene Unremarkable  Behavior Characteristics Cooperative;Appropriate to situation  Mood Anxious  Thought Process  Coherency WDL  Content WDL  Delusions None reported or observed  Perception WDL  Hallucination None reported or observed  Judgment Impaired  Confusion None  Danger to Self  Current suicidal ideation? Denies  Danger to Others  Danger to Others None reported or observed   Pt was offered support and encouragement. Pt was given scheduled medications. Given PRN Senna, PRN Cepacol, PRN Trazodone, and PRN Hydroxyzine per MAR. Q 15 minute checks were done for safety. Pt attended group and interacts well with peers and staff. Pt has no complaints.Pt receptive to treatment and safety maintained on unit.

## 2022-08-25 ENCOUNTER — Emergency Department (HOSPITAL_COMMUNITY): Payer: MEDICAID

## 2022-08-25 ENCOUNTER — Other Ambulatory Visit: Payer: Self-pay

## 2022-08-25 ENCOUNTER — Emergency Department (HOSPITAL_COMMUNITY)
Admission: EM | Admit: 2022-08-25 | Discharge: 2022-08-25 | Disposition: A | Discharge status: Home / Self Care | Payer: MEDICAID | Attending: Emergency Medicine | Admitting: Emergency Medicine

## 2022-08-25 ENCOUNTER — Encounter (HOSPITAL_COMMUNITY): Payer: Self-pay

## 2022-08-25 DIAGNOSIS — M25572 Pain in left ankle and joints of left foot: Secondary | ICD-10-CM | POA: Diagnosis not present

## 2022-08-25 DIAGNOSIS — M79604 Pain in right leg: Secondary | ICD-10-CM | POA: Insufficient documentation

## 2022-08-25 DIAGNOSIS — R519 Headache, unspecified: Secondary | ICD-10-CM | POA: Diagnosis not present

## 2022-08-25 DIAGNOSIS — M25512 Pain in left shoulder: Secondary | ICD-10-CM | POA: Diagnosis not present

## 2022-08-25 DIAGNOSIS — J45909 Unspecified asthma, uncomplicated: Secondary | ICD-10-CM | POA: Insufficient documentation

## 2022-08-25 DIAGNOSIS — M25551 Pain in right hip: Secondary | ICD-10-CM | POA: Insufficient documentation

## 2022-08-25 DIAGNOSIS — S12690A Other displaced fracture of seventh cervical vertebra, initial encounter for closed fracture: Secondary | ICD-10-CM

## 2022-08-25 DIAGNOSIS — Z79899 Other long term (current) drug therapy: Secondary | ICD-10-CM | POA: Diagnosis not present

## 2022-08-25 DIAGNOSIS — Y92410 Unspecified street and highway as the place of occurrence of the external cause: Secondary | ICD-10-CM | POA: Diagnosis not present

## 2022-08-25 DIAGNOSIS — M25552 Pain in left hip: Secondary | ICD-10-CM | POA: Insufficient documentation

## 2022-08-25 DIAGNOSIS — M25511 Pain in right shoulder: Secondary | ICD-10-CM | POA: Insufficient documentation

## 2022-08-25 DIAGNOSIS — R1084 Generalized abdominal pain: Secondary | ICD-10-CM | POA: Diagnosis not present

## 2022-08-25 DIAGNOSIS — M25571 Pain in right ankle and joints of right foot: Secondary | ICD-10-CM | POA: Insufficient documentation

## 2022-08-25 DIAGNOSIS — S93402A Sprain of unspecified ligament of left ankle, initial encounter: Secondary | ICD-10-CM | POA: Diagnosis not present

## 2022-08-25 DIAGNOSIS — M79605 Pain in left leg: Secondary | ICD-10-CM | POA: Diagnosis not present

## 2022-08-25 DIAGNOSIS — S14107A Unspecified injury at C7 level of cervical spinal cord, initial encounter: Secondary | ICD-10-CM | POA: Insufficient documentation

## 2022-08-25 DIAGNOSIS — Z7951 Long term (current) use of inhaled steroids: Secondary | ICD-10-CM | POA: Diagnosis not present

## 2022-08-25 LAB — CBC WITH DIFFERENTIAL/PLATELET
Abs Immature Granulocytes: 0.05 10*3/uL (ref 0.00–0.07)
Basophils Absolute: 0 10*3/uL (ref 0.0–0.1)
Basophils Relative: 0 %
Eosinophils Absolute: 0.1 10*3/uL (ref 0.0–0.5)
Eosinophils Relative: 1 %
HCT: 41.8 % (ref 36.0–46.0)
Hemoglobin: 14.1 g/dL (ref 12.0–15.0)
Immature Granulocytes: 1 %
Lymphocytes Relative: 20 %
Lymphs Abs: 2.1 10*3/uL (ref 0.7–4.0)
MCH: 32.1 pg (ref 26.0–34.0)
MCHC: 33.7 g/dL (ref 30.0–36.0)
MCV: 95.2 fL (ref 80.0–100.0)
Monocytes Absolute: 0.6 10*3/uL (ref 0.1–1.0)
Monocytes Relative: 6 %
Neutro Abs: 7.7 10*3/uL (ref 1.7–7.7)
Neutrophils Relative %: 72 %
Platelets: 212 10*3/uL (ref 150–400)
RBC: 4.39 MIL/uL (ref 3.87–5.11)
RDW: 12.9 % (ref 11.5–15.5)
WBC: 10.6 10*3/uL — ABNORMAL HIGH (ref 4.0–10.5)
nRBC: 0 % (ref 0.0–0.2)

## 2022-08-25 LAB — RAPID URINE DRUG SCREEN, HOSP PERFORMED
Amphetamines: NOT DETECTED
Barbiturates: NOT DETECTED
Benzodiazepines: NOT DETECTED
Cocaine: POSITIVE — AB
Opiates: NOT DETECTED
Tetrahydrocannabinol: POSITIVE — AB

## 2022-08-25 LAB — COMPREHENSIVE METABOLIC PANEL
ALT: 40 U/L (ref 0–44)
AST: 52 U/L — ABNORMAL HIGH (ref 15–41)
Albumin: 4 g/dL (ref 3.5–5.0)
Alkaline Phosphatase: 47 U/L (ref 38–126)
Anion gap: 13 (ref 5–15)
BUN: 12 mg/dL (ref 6–20)
CO2: 21 mmol/L — ABNORMAL LOW (ref 22–32)
Calcium: 8.7 mg/dL — ABNORMAL LOW (ref 8.9–10.3)
Chloride: 102 mmol/L (ref 98–111)
Creatinine, Ser: 0.85 mg/dL (ref 0.44–1.00)
GFR, Estimated: >60 mL/min (ref >60)
Glucose, Bld: 94 mg/dL (ref 70–99)
Potassium: 3.5 mmol/L (ref 3.5–5.1)
Sodium: 136 mmol/L (ref 135–145)
Total Bilirubin: 0.5 mg/dL (ref 0.3–1.2)
Total Protein: 7 g/dL (ref 6.5–8.1)

## 2022-08-25 LAB — I-STAT CHEM 8, ED
BUN: 12 mg/dL (ref 6–20)
Calcium, Ion: 1.11 mmol/L — ABNORMAL LOW (ref 1.15–1.40)
Chloride: 103 mmol/L (ref 98–111)
Creatinine, Ser: 0.9 mg/dL (ref 0.44–1.00)
Glucose, Bld: 93 mg/dL (ref 70–99)
HCT: 42 % (ref 36.0–46.0)
Hemoglobin: 14.3 g/dL (ref 12.0–15.0)
Potassium: 3.6 mmol/L (ref 3.5–5.1)
Sodium: 138 mmol/L (ref 135–145)
TCO2: 23 mmol/L (ref 22–32)

## 2022-08-25 LAB — I-STAT CG4 LACTIC ACID, ED: Lactic Acid, Venous: 0.9 mmol/L (ref 0.5–1.9)

## 2022-08-25 LAB — SAMPLE TO BLOOD BANK

## 2022-08-25 LAB — URINALYSIS, ROUTINE W REFLEX MICROSCOPIC
Bilirubin Urine: NEGATIVE
Glucose, UA: NEGATIVE mg/dL
Hgb urine dipstick: NEGATIVE
Ketones, ur: 20 mg/dL — AB
Leukocytes,Ua: NEGATIVE
Nitrite: NEGATIVE
Protein, ur: NEGATIVE mg/dL
Specific Gravity, Urine: 1.036 — ABNORMAL HIGH (ref 1.005–1.030)
pH: 5 (ref 5.0–8.0)

## 2022-08-25 LAB — HCG, SERUM, QUALITATIVE: Preg, Serum: NEGATIVE

## 2022-08-25 LAB — PROTIME-INR
INR: 1 (ref 0.8–1.2)
Prothrombin Time: 13.8 seconds (ref 11.4–15.2)

## 2022-08-25 LAB — ETHANOL: Alcohol, Ethyl (B): <10 mg/dL (ref <10)

## 2022-08-25 MED ORDER — FENTANYL CITRATE PF 50 MCG/ML IJ SOSY
50.0000 ug | PREFILLED_SYRINGE | Freq: Once | INTRAMUSCULAR | Status: AC
  Administered 2022-08-25: 50 ug via INTRAVENOUS
  Filled 2022-08-25: qty 1

## 2022-08-25 MED ORDER — OXYCODONE HCL 5 MG PO TABS: 5.0000 mg | ORAL_TABLET | ORAL | 0 refills | Status: DC | PRN

## 2022-08-25 MED ORDER — IOHEXOL 350 MG/ML SOLN
75.0000 mL | Freq: Once | INTRAVENOUS | Status: AC | PRN
  Administered 2022-08-25: 75 mL via INTRAVENOUS
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
## 2022-08-25 NOTE — Progress Notes (Signed)
Orthopedic Tech Progress Note Patient Details:  [name] Aug 07, 2000 [id number]  Ortho Devices Type of Ortho Device: Ace wrap, Crutches Ortho Device/Splint Location: Left ankle Ortho Device/Splint Interventions: Application, Adjustment   Post Interventions Patient Tolerated: Ambulated well  Celica Kotowski E Chauntel Windsor 08/25/2022, 1:15 PM
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
## 2022-08-25 NOTE — ED Notes (Signed)
Patient transported to CT 

## 2022-09-23 ENCOUNTER — Emergency Department (HOSPITAL_COMMUNITY)
Admission: EM | Admit: 2022-09-23 | Discharge: 2022-09-24 | Discharge status: Against Medical Advice | Payer: MEDICAID | Attending: Emergency Medicine | Admitting: Emergency Medicine

## 2022-09-23 ENCOUNTER — Other Ambulatory Visit: Payer: Self-pay

## 2022-09-23 DIAGNOSIS — M542 Cervicalgia: Secondary | ICD-10-CM | POA: Diagnosis not present

## 2022-09-23 DIAGNOSIS — Z5321 Procedure and treatment not carried out due to patient leaving prior to being seen by health care provider: Secondary | ICD-10-CM | POA: Diagnosis not present

## 2022-09-23 DIAGNOSIS — M549 Dorsalgia, unspecified: Secondary | ICD-10-CM | POA: Diagnosis not present

## 2022-09-23 DIAGNOSIS — Y92410 Unspecified street and highway as the place of occurrence of the external cause: Secondary | ICD-10-CM | POA: Diagnosis not present

## 2022-09-23 NOTE — ED Notes (Signed)
Pt seen leaving er as the pt stated she was leaving
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
## 2022-11-03 ENCOUNTER — Inpatient Hospital Stay (HOSPITAL_COMMUNITY)
Admission: AD | Admit: 2022-11-03 | Discharge: 2022-11-07 | DRG: 885 | Disposition: A | Discharge status: Home / Self Care | Payer: MEDICAID | Source: Intra-hospital | Attending: Psychiatry | Admitting: Psychiatry

## 2022-11-03 ENCOUNTER — Emergency Department (HOSPITAL_COMMUNITY)
Admission: EM | Admit: 2022-11-03 | Discharge: 2022-11-03 | Disposition: A | Discharge status: Other Acute Inpatient Hospital | Payer: MEDICAID | Attending: Emergency Medicine | Admitting: Emergency Medicine

## 2022-11-03 ENCOUNTER — Encounter (HOSPITAL_COMMUNITY): Payer: Self-pay

## 2022-11-03 ENCOUNTER — Other Ambulatory Visit: Payer: Self-pay

## 2022-11-03 DIAGNOSIS — E739 Lactose intolerance, unspecified: Secondary | ICD-10-CM | POA: Diagnosis present

## 2022-11-03 DIAGNOSIS — Z9151 Personal history of suicidal behavior: Secondary | ICD-10-CM

## 2022-11-03 DIAGNOSIS — F1994 Other psychoactive substance use, unspecified with psychoactive substance-induced mood disorder: Secondary | ICD-10-CM | POA: Diagnosis present

## 2022-11-03 DIAGNOSIS — F3160 Bipolar disorder, current episode mixed, unspecified: Secondary | ICD-10-CM | POA: Diagnosis present

## 2022-11-03 DIAGNOSIS — G4700 Insomnia, unspecified: Secondary | ICD-10-CM | POA: Diagnosis present

## 2022-11-03 DIAGNOSIS — F315 Bipolar disorder, current episode depressed, severe, with psychotic features: Principal | ICD-10-CM | POA: Diagnosis present

## 2022-11-03 DIAGNOSIS — Z79899 Other long term (current) drug therapy: Secondary | ICD-10-CM

## 2022-11-03 DIAGNOSIS — Z62810 Personal history of physical and sexual abuse in childhood: Secondary | ICD-10-CM

## 2022-11-03 DIAGNOSIS — R41840 Attention and concentration deficit: Secondary | ICD-10-CM | POA: Diagnosis present

## 2022-11-03 DIAGNOSIS — F17210 Nicotine dependence, cigarettes, uncomplicated: Secondary | ICD-10-CM | POA: Diagnosis present

## 2022-11-03 DIAGNOSIS — F1510 Other stimulant abuse, uncomplicated: Secondary | ICD-10-CM | POA: Diagnosis present

## 2022-11-03 DIAGNOSIS — F411 Generalized anxiety disorder: Secondary | ICD-10-CM | POA: Diagnosis present

## 2022-11-03 DIAGNOSIS — F1010 Alcohol abuse, uncomplicated: Secondary | ICD-10-CM | POA: Diagnosis present

## 2022-11-03 DIAGNOSIS — F314 Bipolar disorder, current episode depressed, severe, without psychotic features: Secondary | ICD-10-CM | POA: Diagnosis present

## 2022-11-03 DIAGNOSIS — Z5901 Sheltered homelessness: Secondary | ICD-10-CM

## 2022-11-03 DIAGNOSIS — Z9109 Other allergy status, other than to drugs and biological substances: Secondary | ICD-10-CM

## 2022-11-03 DIAGNOSIS — F1910 Other psychoactive substance abuse, uncomplicated: Secondary | ICD-10-CM | POA: Diagnosis present

## 2022-11-03 DIAGNOSIS — F339 Major depressive disorder, recurrent, unspecified: Secondary | ICD-10-CM | POA: Insufficient documentation

## 2022-11-03 DIAGNOSIS — M542 Cervicalgia: Secondary | ICD-10-CM | POA: Diagnosis present

## 2022-11-03 DIAGNOSIS — F1090 Alcohol use, unspecified, uncomplicated: Secondary | ICD-10-CM | POA: Diagnosis present

## 2022-11-03 DIAGNOSIS — F4310 Post-traumatic stress disorder, unspecified: Secondary | ICD-10-CM | POA: Diagnosis present

## 2022-11-03 DIAGNOSIS — M549 Dorsalgia, unspecified: Secondary | ICD-10-CM | POA: Diagnosis present

## 2022-11-03 DIAGNOSIS — F603 Borderline personality disorder: Secondary | ICD-10-CM | POA: Diagnosis present

## 2022-11-03 DIAGNOSIS — F332 Major depressive disorder, recurrent severe without psychotic features: Principal | ICD-10-CM | POA: Diagnosis present

## 2022-11-03 DIAGNOSIS — F1590 Other stimulant use, unspecified, uncomplicated: Secondary | ICD-10-CM | POA: Diagnosis present

## 2022-11-03 DIAGNOSIS — J45909 Unspecified asthma, uncomplicated: Secondary | ICD-10-CM | POA: Diagnosis present

## 2022-11-03 DIAGNOSIS — F1210 Cannabis abuse, uncomplicated: Secondary | ICD-10-CM | POA: Diagnosis present

## 2022-11-03 DIAGNOSIS — F5105 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 disorder: Secondary | ICD-10-CM | POA: Diagnosis present

## 2022-11-03 DIAGNOSIS — R45851 Suicidal ideations: Secondary | ICD-10-CM | POA: Insufficient documentation

## 2022-11-03 DIAGNOSIS — F1290 Cannabis use, unspecified, uncomplicated: Secondary | ICD-10-CM | POA: Diagnosis present

## 2022-11-03 DIAGNOSIS — F410 Panic disorder [episodic paroxysmal anxiety] without agoraphobia: Secondary | ICD-10-CM | POA: Diagnosis present

## 2022-11-03 LAB — CBC WITH DIFFERENTIAL/PLATELET
Abs Immature Granulocytes: 0.03 10*3/uL (ref 0.00–0.07)
Basophils Absolute: 0 10*3/uL (ref 0.0–0.1)
Basophils Relative: 0 %
Eosinophils Absolute: 0.1 10*3/uL (ref 0.0–0.5)
Eosinophils Relative: 1 %
HCT: 41.9 % (ref 36.0–46.0)
Hemoglobin: 13.7 g/dL (ref 12.0–15.0)
Immature Granulocytes: 0 %
Lymphocytes Relative: 16 %
Lymphs Abs: 1.3 10*3/uL (ref 0.7–4.0)
MCH: 32.2 pg (ref 26.0–34.0)
MCHC: 32.7 g/dL (ref 30.0–36.0)
MCV: 98.6 fL (ref 80.0–100.0)
Monocytes Absolute: 0.5 10*3/uL (ref 0.1–1.0)
Monocytes Relative: 6 %
Neutro Abs: 6.1 10*3/uL (ref 1.7–7.7)
Neutrophils Relative %: 77 %
Platelets: 204 10*3/uL (ref 150–400)
RBC: 4.25 MIL/uL (ref 3.87–5.11)
RDW: 13.2 % (ref 11.5–15.5)
WBC: 8 10*3/uL (ref 4.0–10.5)
nRBC: 0 % (ref 0.0–0.2)

## 2022-11-03 LAB — COMPREHENSIVE METABOLIC PANEL
ALT: 17 U/L (ref 0–44)
AST: 19 U/L (ref 15–41)
Albumin: 3.9 g/dL (ref 3.5–5.0)
Alkaline Phosphatase: 48 U/L (ref 38–126)
Anion gap: 9 (ref 5–15)
BUN: 15 mg/dL (ref 6–20)
CO2: 21 mmol/L — ABNORMAL LOW (ref 22–32)
Calcium: 8.5 mg/dL — ABNORMAL LOW (ref 8.9–10.3)
Chloride: 108 mmol/L (ref 98–111)
Creatinine, Ser: 0.66 mg/dL (ref 0.44–1.00)
GFR, Estimated: >60 mL/min (ref >60)
Glucose, Bld: 96 mg/dL (ref 70–99)
Potassium: 4.2 mmol/L (ref 3.5–5.1)
Sodium: 138 mmol/L (ref 135–145)
Total Bilirubin: 0.5 mg/dL (ref 0.3–1.2)
Total Protein: 6.8 g/dL (ref 6.5–8.1)

## 2022-11-03 LAB — URINALYSIS, ROUTINE W REFLEX MICROSCOPIC
Bilirubin Urine: NEGATIVE
Glucose, UA: NEGATIVE mg/dL
Hgb urine dipstick: NEGATIVE
Ketones, ur: NEGATIVE mg/dL
Nitrite: NEGATIVE
Protein, ur: NEGATIVE mg/dL
Specific Gravity, Urine: 1.018 (ref 1.005–1.030)
pH: 6 (ref 5.0–8.0)

## 2022-11-03 LAB — RAPID URINE DRUG SCREEN, HOSP PERFORMED
Amphetamines: NOT DETECTED
Barbiturates: NOT DETECTED
Benzodiazepines: NOT DETECTED
Cocaine: POSITIVE — AB
Opiates: NOT DETECTED
Tetrahydrocannabinol: POSITIVE — AB

## 2022-11-03 LAB — HCG, SERUM, QUALITATIVE: Preg, Serum: NEGATIVE

## 2022-11-03 LAB — ETHANOL: Alcohol, Ethyl (B): <10 mg/dL

## 2022-11-03 MED ORDER — LOPERAMIDE HCL 2 MG PO CAPS: 2.0000 mg | ORAL_CAPSULE | ORAL | Status: DC | PRN

## 2022-11-03 MED ORDER — MAGNESIUM HYDROXIDE 400 MG/5ML PO SUSP: 30.0000 mL | Freq: Every day | ORAL | Status: DC | PRN

## 2022-11-03 MED ORDER — LORAZEPAM 1 MG PO TABS
0.0000 mg | ORAL_TABLET | Freq: Four times a day (QID) | ORAL | Status: DC
  Administered 2022-11-03: 2 mg via ORAL
  Filled 2022-11-03: qty 2

## 2022-11-03 MED ORDER — ACETAMINOPHEN 325 MG PO TABS: 650.0000 mg | ORAL_TABLET | Freq: Four times a day (QID) | ORAL | Status: DC | PRN

## 2022-11-03 MED ORDER — THIAMINE MONONITRATE 100 MG PO TABS
100.0000 mg | ORAL_TABLET | Freq: Every day | ORAL | Status: DC
  Administered 2022-11-03: 100 mg via ORAL
  Filled 2022-11-03: qty 1

## 2022-11-03 MED ORDER — PRAZOSIN HCL 1 MG PO CAPS
1.0000 mg | ORAL_CAPSULE | Freq: Every day | ORAL | Status: DC
  Administered 2022-11-03 – 2022-11-06 (×4): 1 mg via ORAL
  Filled 2022-11-03 (×7): qty 1

## 2022-11-03 MED ORDER — ARIPIPRAZOLE 5 MG PO TABS
5.0000 mg | ORAL_TABLET | Freq: Every day | ORAL | Status: DC
  Administered 2022-11-04 – 2022-11-07 (×4): 5 mg via ORAL
  Filled 2022-11-03 (×6): qty 1

## 2022-11-03 MED ORDER — TRAZODONE HCL 50 MG PO TABS
50.0000 mg | ORAL_TABLET | Freq: Every evening | ORAL | Status: DC | PRN
  Administered 2022-11-03 – 2022-11-06 (×4): 50 mg via ORAL
  Filled 2022-11-03 (×4): qty 1

## 2022-11-03 MED ORDER — METHOCARBAMOL 500 MG PO TABS: 500.0000 mg | ORAL_TABLET | Freq: Three times a day (TID) | ORAL | Status: DC | PRN

## 2022-11-03 MED ORDER — HYDROXYZINE HCL 25 MG PO TABS: 25.0000 mg | ORAL_TABLET | Freq: Four times a day (QID) | ORAL | Status: DC | PRN

## 2022-11-03 MED ORDER — HYDROXYZINE HCL 25 MG PO TABS: 25.0000 mg | ORAL_TABLET | Freq: Three times a day (TID) | ORAL | Status: DC | PRN

## 2022-11-03 MED ORDER — NAPROXEN 500 MG PO TABS
500.0000 mg | ORAL_TABLET | Freq: Two times a day (BID) | ORAL | Status: DC | PRN
  Administered 2022-11-06 (×2): 500 mg via ORAL
  Filled 2022-11-03 (×2): qty 1

## 2022-11-03 MED ORDER — ESCITALOPRAM OXALATE 10 MG PO TABS
10.0000 mg | ORAL_TABLET | Freq: Every day | ORAL | Status: DC
  Administered 2022-11-03: 10 mg via ORAL
  Filled 2022-11-03: qty 1

## 2022-11-03 MED ORDER — ARIPIPRAZOLE 5 MG PO TABS
5.0000 mg | ORAL_TABLET | Freq: Every day | ORAL | Status: DC
  Administered 2022-11-03: 5 mg via ORAL
  Filled 2022-11-03: qty 1

## 2022-11-03 MED ORDER — LORAZEPAM 2 MG/ML IJ SOLN: 0.0000 mg | Freq: Four times a day (QID) | INTRAMUSCULAR | Status: DC

## 2022-11-03 MED ORDER — LORAZEPAM 2 MG/ML IJ SOLN: 2.0000 mg | Freq: Three times a day (TID) | INTRAMUSCULAR | Status: DC | PRN

## 2022-11-03 MED ORDER — LORAZEPAM 1 MG PO TABS: 2.0000 mg | ORAL_TABLET | Freq: Three times a day (TID) | ORAL | Status: DC | PRN

## 2022-11-03 MED ORDER — ESCITALOPRAM OXALATE 10 MG PO TABS
10.0000 mg | ORAL_TABLET | Freq: Every day | ORAL | Status: DC
  Administered 2022-11-04: 10 mg via ORAL
  Filled 2022-11-03 (×2): qty 1

## 2022-11-03 MED ORDER — TRAZODONE HCL 50 MG PO TABS: 50.0000 mg | ORAL_TABLET | Freq: Every day | ORAL | Status: DC

## 2022-11-03 MED ORDER — DICYCLOMINE HCL 20 MG PO TABS: 20.0000 mg | ORAL_TABLET | Freq: Four times a day (QID) | ORAL | Status: DC | PRN

## 2022-11-03 MED ORDER — ONDANSETRON 4 MG PO TBDP: 4.0000 mg | ORAL_TABLET | Freq: Four times a day (QID) | ORAL | Status: DC | PRN

## 2022-11-03 MED ORDER — THIAMINE HCL 100 MG/ML IJ SOLN: 100.0000 mg | Freq: Every day | INTRAMUSCULAR | Status: DC

## 2022-11-03 MED ORDER — PRAZOSIN HCL 1 MG PO CAPS: 1.0000 mg | ORAL_CAPSULE | Freq: Every day | ORAL | Status: DC

## 2022-11-03 MED ORDER — ALUM & MAG HYDROXIDE-SIMETH 200-200-20 MG/5ML PO SUSP: 30.0000 mL | ORAL | Status: DC | PRN
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
## 2022-11-03 NOTE — ED Notes (Signed)
[facility] spoke with pts mother Jasmarie Coppock) for collateral. Pts mother said that pt has had life events recently such as her boyfriend being incarcerated and possibly losing custody of her son that have caused pt to spiral down. Pts mother reports a history of drug addiction and mental health struggles with a recent hospitalization in June and July of this year. Per pts mother, pt has not taken her medication since her most recent hospitalization.   Pts mother would like for pt to have long term residential treatment for both her substance abuse and mental health issues. Pts mother would like to be notified of her disposition.   Jacquelynn Cree, [facility]  11/03/22

## 2022-11-03 NOTE — Progress Notes (Signed)
Pt was accepted to [facility] TODAY 11/03/2022; Bed Assignment 405-2 PENDING BP improved, UKG, UDS, and Signed voluntary consent uploaded to chart or faxed to [facility] [phone]  Pt meets inpatient criteria per Joaquim Nam  Attending Physician will be Uvaldo Rising, MD   Report can be called to:Adult unit: [phone]  Pt can arrive after: [facility] to coordinate with care team once PENDING items are confirmed.  Care Team notified: Day [facility] Rona Ravens, RN,Kyra Ky Barban, Paramedic, [place], LCSW    [place], [place] 11/03/2022 @ 5:39 PM

## 2022-11-03 NOTE — ED Triage Notes (Signed)
Pt currently dealing with several losses, and struggling to cope. Loss of her significant other, child, and current housing. Pt having thoughts of hurting herself and recognizing that she is in need of help.

## 2022-11-04 ENCOUNTER — Encounter (HOSPITAL_COMMUNITY): Payer: Self-pay | Admitting: Psychiatry

## 2022-11-04 ENCOUNTER — Other Ambulatory Visit: Payer: Self-pay

## 2022-11-04 DIAGNOSIS — F1090 Alcohol use, unspecified, uncomplicated: Secondary | ICD-10-CM | POA: Diagnosis present

## 2022-11-04 DIAGNOSIS — E669 Obesity, unspecified: Secondary | ICD-10-CM | POA: Insufficient documentation

## 2022-11-04 DIAGNOSIS — F332 Major depressive disorder, recurrent severe without psychotic features: Secondary | ICD-10-CM | POA: Diagnosis not present

## 2022-11-04 MED ORDER — NICOTINE 14 MG/24HR TD PT24
14.0000 mg | MEDICATED_PATCH | Freq: Every day | TRANSDERMAL | Status: DC
  Administered 2022-11-04 – 2022-11-07 (×4): 14 mg via TRANSDERMAL
  Filled 2022-11-04 (×6): qty 1

## 2022-11-04 MED ORDER — ADULT MULTIVITAMIN W/MINERALS CH
1.0000 | ORAL_TABLET | Freq: Every day | ORAL | Status: DC
  Administered 2022-11-04 – 2022-11-07 (×4): 1 via ORAL
  Filled 2022-11-04 (×6): qty 1

## 2022-11-04 MED ORDER — VITAMIN B-1 100 MG PO TABS
100.0000 mg | ORAL_TABLET | Freq: Every day | ORAL | Status: DC
  Administered 2022-11-05 – 2022-11-07 (×3): 100 mg via ORAL
  Filled 2022-11-04 (×5): qty 1

## 2022-11-04 MED ORDER — LORAZEPAM 1 MG PO TABS: 1.0000 mg | ORAL_TABLET | Freq: Four times a day (QID) | ORAL | Status: AC | PRN

## 2022-11-04 MED ORDER — LIDOCAINE 5 % EX PTCH
2.0000 | MEDICATED_PATCH | Freq: Every day | CUTANEOUS | Status: DC
  Administered 2022-11-04 – 2022-11-07 (×4): 2 via TRANSDERMAL
  Filled 2022-11-04 (×7): qty 2

## 2022-11-04 MED ORDER — ATOMOXETINE HCL 40 MG PO CAPS
40.0000 mg | ORAL_CAPSULE | Freq: Every day | ORAL | Status: DC
  Administered 2022-11-05 – 2022-11-06 (×2): 40 mg via ORAL
  Filled 2022-11-04 (×4): qty 1

## 2022-11-04 MED ORDER — OLANZAPINE 10 MG IM SOLR: 5.0000 mg | Freq: Three times a day (TID) | INTRAMUSCULAR | Status: DC | PRN

## 2022-11-04 MED ORDER — LIDOCAINE 5 % EX PTCH
2.0000 | MEDICATED_PATCH | CUTANEOUS | Status: DC
  Filled 2022-11-04: qty 2

## 2022-11-04 MED ORDER — THIAMINE HCL 100 MG/ML IJ SOLN: 100.0000 mg | Freq: Once | INTRAMUSCULAR | Status: DC

## 2022-11-04 MED ORDER — HYDROXYZINE HCL 25 MG PO TABS: 25.0000 mg | ORAL_TABLET | Freq: Four times a day (QID) | ORAL | Status: DC | PRN

## 2022-11-04 MED ORDER — ONDANSETRON 4 MG PO TBDP: 4.0000 mg | ORAL_TABLET | Freq: Four times a day (QID) | ORAL | Status: DC | PRN

## 2022-11-04 MED ORDER — TOPIRAMATE 25 MG PO TABS
25.0000 mg | ORAL_TABLET | Freq: Two times a day (BID) | ORAL | Status: DC
  Administered 2022-11-04 – 2022-11-07 (×6): 25 mg via ORAL
  Filled 2022-11-04 (×11): qty 1

## 2022-11-04 MED ORDER — OLANZAPINE 5 MG PO TABS: 5.0000 mg | ORAL_TABLET | Freq: Three times a day (TID) | ORAL | Status: DC | PRN

## 2022-11-04 MED ORDER — LOPERAMIDE HCL 2 MG PO CAPS: 2.0000 mg | ORAL_CAPSULE | ORAL | Status: DC | PRN

## 2022-11-04 NOTE — Progress Notes (Signed)
   11/04/22 1300  Psych Admission Type (Psych Patients Only)  Admission Status Voluntary  Psychosocial Assessment  Patient Complaints Anxiety;Depression;Hopelessness  Eye Contact Fair  Facial Expression Anxious  Affect Appropriate to circumstance  Speech Logical/coherent  Interaction Assertive  Motor Activity Fidgety  Appearance/Hygiene Unremarkable  Behavior Characteristics Cooperative;Anxious  Mood Anxious  Thought Process  Coherency WDL  Content WDL  Delusions WDL  Perception WDL  Hallucination None reported or observed  Judgment WDL  Confusion WDL  Danger to Self  Current suicidal ideation? Denies  Danger to Others  Danger to Others None reported or observed

## 2022-11-04 NOTE — Group Note (Signed)
Date:  11/04/2022 Time:  11:23 AM  Group Topic/Focus:  Goals Group:   The focus of this group is to help patients establish daily goals to achieve during treatment and discuss how the patient can incorporate goal setting into their daily lives to aide in recovery.    Participation Level:  Active  Participation Quality:  Drowsy  Affect:  Blunted  Cognitive:  Appropriate  Insight: Lacking  Engagement in Group:  Limited  Modes of Intervention:  Discussion  Additional Comments:     Reymundo Poll 11/04/2022, 11:23 AM
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
## 2022-11-04 NOTE — [facility] Group Notes (Signed)
[facility] Group Notes:  (Nursing/MHT/Case Management/Adjunct)  Date:  11/04/2022  Time:  11:02 PM  Type of Therapy:  Wrap Up Group  Participation Level:  Did Not Attend  Participation Quality:   did not attend  Affect:   did not attend  Cognitive:   did not attend  Insight:  None  Engagement in Group:   did not attend  Modes of Intervention:   did not attend  Summary of Progress/Problems:  [name] E [name] 11/04/2022, 11:02 PM
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
## 2022-11-04 NOTE — Group Note (Signed)
[facility] LCSW Group Therapy Note   Group Date: 11/04/2022 Start Time: 1105 End Time: 1155  Type of Therapy/Topic:  Group Therapy:  Feelings about Diagnosis  Participation Level:  Did Not Attend      Description of Group:    This group will allow patients to explore their thoughts and feelings about diagnoses they have received. Patients will be guided to explore their level of understanding and acceptance of these diagnoses. Facilitator will encourage patients to process their thoughts and feelings about the reactions of others to their diagnosis, and will guide patients in identifying ways to discuss their diagnosis with significant others in their lives. This group will be process-oriented, with patients participating in exploration of their own experiences as well as giving and receiving support and challenge from other group members.   Therapeutic Goals: 1. Patient will demonstrate understanding of diagnosis as evidence by identifying two or more symptoms of the disorder:  2. Patient will be able to express two feelings regarding the diagnosis 3. Patient will demonstrate ability to communicate their needs through discussion and/or role play.        Therapeutic Modalities:   Cognitive Behavioral Therapy Brief Therapy Feelings Identification    [name] Ady Heimann, LCSW
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
## 2022-11-04 NOTE — Progress Notes (Signed)
[facility] LCSW Progress Note   11/04/2022    11:38 AM  [name]      Type of Note: Residential Resources    CSW provided patient with [facility], [facility], [facility], and [facility] contact numbers since she is interested in treatment. CSW explained to patient that once the provider put her H&P in chart, then a referral to [facility] and [facility] will be faxed; patient understood and said" thank you, if nothing works out , then I will return back to my granddad house ". CSW will continue to assist.     Signed:   Jacob Moores, MSW, [facility] 11/04/2022 11:38 AM

## 2022-11-04 NOTE — Plan of Care (Signed)
  Problem: Education: Goal: Knowledge of [facility] Education information/materials will improve Outcome: Progressing Goal: Emotional status will improve Outcome: Progressing Goal: Verbalization of understanding the information provided will improve Outcome: Progressing   
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
## 2022-11-04 NOTE — Progress Notes (Signed)
D. Pt has been appropriate on the unit- calm and cooperative- visible in the milieu interacting well with peers, and attending groups. Per pt's self inventory, pt rated her depression,hopelessness and anxiety an 09/01/08, respectively. Pt's stated goal today is "to be open and honest with doctor, and remember I don't have to be scared." Pt currently denies withdrawal symptoms,  SI/HI and AVH and agrees to contact staff before acting on any harmful thoughts.  A. Labs and vitals monitored. Pt given and educated on medications. Pt supported emotionally and encouraged to express concerns and ask questions.   R. Pt remains safe with 15 minute checks. Will continue POC.

## 2022-11-05 ENCOUNTER — Encounter (HOSPITAL_COMMUNITY): Payer: Self-pay

## 2022-11-05 DIAGNOSIS — F332 Major depressive disorder, recurrent severe without psychotic features: Secondary | ICD-10-CM | POA: Diagnosis not present

## 2022-11-05 MED ORDER — LORATADINE 10 MG PO TABS
10.0000 mg | ORAL_TABLET | Freq: Every day | ORAL | Status: DC
  Administered 2022-11-05 – 2022-11-07 (×3): 10 mg via ORAL
  Filled 2022-11-05 (×6): qty 1

## 2022-11-05 NOTE — Group Note (Signed)
Date:  11/05/2022 Time:  9:07 AM  Group Topic/Focus:  Goals Group:   The focus of this group is to help patients establish daily goals to achieve during treatment and discuss how the patient can incorporate goal setting into their daily lives to aide in recovery.    Participation Level:  Active  Participation Quality:  Appropriate  Affect:  Appropriate  Cognitive:  Appropriate  Insight: Appropriate  Engagement in Group:  Engaged  Modes of Intervention:  Discussion  Additional Comments:    Mariane Burpee D Fusae Florio 11/05/2022, 9:07 AM

## 2022-11-05 NOTE — [facility] Suicide Risk Assessment (Signed)
[facility] INPATIENT:  Family/Significant Other Suicide Prevention Education  Suicide Prevention Education:  Education Completed;  [name] ( mom) [phone]   (name of family member/significant other) has been identified by the patient as the family member/significant other with whom the patient will be residing, and identified as the person(s) who will aid the patient in the event of a mental health crisis (suicidal ideations/suicide attempt).  With written consent from the patient, the family member/significant other has been provided the following suicide prevention education, prior to the and/or following the discharge of the patient.  CSW spoke with patient mom and completed Safety planning. Mom shared that patient has been in and out of treatment facilities since she was 12 and had just found out about the substances that she was using for 2 years. CSW shared with mom the plans we have for patient and that she was accepted to [facility] in [place]  and [facility] and mom stated, " that is great news, she really needs to be far away from here and I don't mind contributing to the ticket amount ".  Mom said that she would reach out to patient dad and then call patient regarding the ticket.    The suicide prevention education provided includes the following: Suicide risk factors Suicide prevention and interventions National Suicide Hotline telephone number [facility] assessment telephone number [facility] Emergency Assistance 911 [facility] and/or Residential Mobile Crisis Unit telephone number  Request made of family/significant other to: Remove weapons (e.g., guns, rifles, knives), all items previously/currently identified as safety concern.   Remove drugs/medications (over-the-counter, prescriptions, illicit drugs), all items previously/currently identified as a safety concern.  The family member/significant other verbalizes understanding of the suicide prevention  education information provided.  The family member/significant other agrees to remove the items of safety concern listed above.  [name] 11/05/2022, 2:23 PM

## 2022-11-05 NOTE — Progress Notes (Signed)
Patient complained of lightheadedness, hot flashes, and dizziness this afternoon. Pt reports she has low iron and her period is making it worse. Pt provided with 2 cups of Gatorade and reported "I immediately feel better". Pt's vitals signs WNL. Pt ambulates with steady gait.

## 2022-11-05 NOTE — BH IP Treatment Plan (Signed)
Interdisciplinary Treatment and Diagnostic Plan Update  11/05/2022 Time of Session: 11:15 AM  Rion Schnitzer MRN: [id number]  Principal Diagnosis: MDD (major depressive disorder), recurrent severe, without psychosis (HCC)  Secondary Diagnoses: Principal Problem:   MDD (major depressive disorder), recurrent severe, without psychosis (HCC) Active Problems:   Borderline personality disorder (HCC)   Generalized anxiety disorder   Polysubstance abuse (HCC)   PTSD (post-traumatic stress disorder)   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 disorder   Substance induced mood disorder (HCC)   Attention and concentration deficit   Bipolar affective disorder, depressed, severe, with psychotic behavior (HCC)   Severe bipolar I disorder, most recent episode depressed (HCC)   Panic disorder   Cannabis use disorder   Stimulant use disorder   Alcohol use disorder   Current Medications:  Current Facility-Administered Medications  Medication Dose Route Frequency Provider Last Rate Last Admin   acetaminophen (TYLENOL) tablet 650 mg  650 mg Oral Q6H PRN Onuoha, Chinwendu V, NP       alum & mag hydroxide-simeth (MAALOX/MYLANTA) 200-200-20 MG/5ML suspension 30 mL  30 mL Oral Q4H PRN Onuoha, Chinwendu V, NP       ARIPiprazole (ABILIFY) tablet 5 mg  5 mg Oral Daily Onuoha, Chinwendu V, NP   5 mg at 11/05/22 0745   atomoxetine (STRATTERA) capsule 40 mg  40 mg Oral Daily Hill, Shelbie Hutching, MD   40 mg at 11/05/22 0746   dicyclomine (BENTYL) tablet 20 mg  20 mg Oral Q6H PRN Onuoha, Chinwendu V, NP       [START ON 11/09/2022] hydrOXYzine (ATARAX) tablet 25 mg  25 mg Oral TID PRN Onuoha, Chinwendu V, NP       lidocaine (LIDODERM) 5 % 2 patch  2 patch Transdermal Daily Massengill, Nathan, MD   2 patch at 11/05/22 0751   loperamide (IMODIUM) capsule 2-4 mg  2-4 mg Oral PRN Onuoha, Chinwendu V, NP       LORazepam (ATIVAN) tablet 1 mg  1 mg Oral Q6H PRN Hill, Shelbie Hutching, MD       magnesium hydroxide (MILK OF MAGNESIA)  suspension 30 mL  30 mL Oral Daily PRN Onuoha, Chinwendu V, NP       methocarbamol (ROBAXIN) tablet 500 mg  500 mg Oral Q8H PRN Onuoha, Chinwendu V, NP       multivitamin with minerals tablet 1 tablet  1 tablet Oral Daily Hill, Shelbie Hutching, MD   1 tablet at 11/05/22 0746   naproxen (NAPROSYN) tablet 500 mg  500 mg Oral BID PRN Onuoha, Chinwendu V, NP       nicotine (NICODERM CQ - dosed in mg/24 hours) patch 14 mg  14 mg Transdermal Daily Onuoha, Chinwendu V, NP   14 mg at 11/05/22 0754   OLANZapine (ZYPREXA) tablet 5 mg  5 mg Oral TID PRN Roselle Locus, MD       Or   OLANZapine (ZYPREXA) injection 5 mg  5 mg Intramuscular TID PRN Roselle Locus, MD       ondansetron (ZOFRAN-ODT) disintegrating tablet 4 mg  4 mg Oral Q6H PRN Onuoha, Chinwendu V, NP       prazosin (MINIPRESS) capsule 1 mg  1 mg Oral QHS Onuoha, Chinwendu V, NP   1 mg at 11/04/22 2135   thiamine (Vitamin B-1) tablet 100 mg  100 mg Oral Daily Roselle Locus, MD   100 mg at 11/05/22 0746   topiramate (TOPAMAX) tablet 25 mg  25 mg Oral BID Haze Rushing  Leigh, MD   25 mg at 11/05/22 0745   traZODone (DESYREL) tablet 50 mg  50 mg Oral QHS PRN Onuoha, Chinwendu V, NP   50 mg at 11/04/22 2135   PTA Medications: Medications Prior to Admission  Medication Sig Dispense Refill Last Dose   albuterol (VENTOLIN HFA) 108 (90 Base) MCG/ACT inhaler Inhale 1-2 puffs into the lungs every 6 (six) hours as needed for wheezing or shortness of breath. (Patient not taking: Reported on 11/03/2022)      ARIPiprazole (ABILIFY) 5 MG tablet Take 1 tablet (5 mg total) by mouth daily. (Patient not taking: Reported on 11/03/2022) 30 tablet 0    cetirizine (ZYRTEC) 10 MG tablet Take 10 mg by mouth daily.      diphenhydrAMINE (BENADRYL) 25 mg capsule Take 25 mg by mouth as needed for allergies.      escitalopram (LEXAPRO) 10 MG tablet Take 1 tablet (10 mg total) by mouth daily. (Patient not taking: Reported on 11/03/2022) 30 tablet 0     hydrOXYzine (ATARAX) 10 MG tablet Take 1 tablet (10 mg total) by mouth 3 (three) times daily as needed for anxiety. (Patient not taking: Reported on 11/03/2022) 30 tablet 0    oxyCODONE (ROXICODONE) 5 MG immediate release tablet Take 1 tablet (5 mg total) by mouth every 4 (four) hours as needed for severe pain. (Patient not taking: Reported on 11/03/2022) 6 tablet 0    prazosin (MINIPRESS) 1 MG capsule Take 1 capsule (1 mg total) by mouth at bedtime. (Patient not taking: Reported on 11/03/2022) 30 capsule 0    traZODone (DESYREL) 50 MG tablet Take 1 tablet (50 mg total) by mouth at bedtime as needed for sleep. (Patient not taking: Reported on 11/03/2022) 30 tablet 0     Patient Stressors: Marital or family conflict   Substance abuse   Traumatic event    Patient Strengths: General fund of knowledge  Motivation for treatment/growth  Supportive family/friends   Treatment Modalities: Medication Management, Group therapy, Case management,  1 to 1 session with clinician, Psychoeducation, Recreational therapy.   Physician Treatment Plan for Primary Diagnosis: MDD (major depressive disorder), recurrent severe, without psychosis (HCC) Long Term Goal(s): Improvement in symptoms so as ready for discharge   Short Term Goals: Ability to identify changes in lifestyle to reduce recurrence of condition will improve Ability to verbalize feelings will improve Ability to disclose and discuss suicidal ideas Ability to demonstrate self-control will improve Ability to identify and develop effective coping behaviors will improve Ability to maintain clinical measurements within normal limits will improve Compliance with prescribed medications will improve Ability to identify triggers associated with substance abuse/mental health issues will improve  Medication Management: Evaluate patient's response, side effects, and tolerance of medication regimen.  Therapeutic Interventions: 1 to 1 sessions, Unit Group  sessions and Medication administration.  Evaluation of Outcomes: Not Progressing  Physician Treatment Plan for Secondary Diagnosis: Principal Problem:   MDD (major depressive disorder), recurrent severe, without psychosis (HCC) Active Problems:   Borderline personality disorder (HCC)   Generalized anxiety disorder   Polysubstance abuse (HCC)   PTSD (post-traumatic stress disorder)   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 disorder   Substance induced mood disorder (HCC)   Attention and concentration deficit   Bipolar affective disorder, depressed, severe, with psychotic behavior (HCC)   Severe bipolar I disorder, most recent episode depressed (HCC)   Panic disorder   Cannabis use disorder   Stimulant use disorder   Alcohol use disorder  Long Term Goal(s): Improvement  in symptoms so as ready for discharge   Short Term Goals: Ability to identify changes in lifestyle to reduce recurrence of condition will improve Ability to verbalize feelings will improve Ability to disclose and discuss suicidal ideas Ability to demonstrate self-control will improve Ability to identify and develop effective coping behaviors will improve Ability to maintain clinical measurements within normal limits will improve Compliance with prescribed medications will improve Ability to identify triggers associated with substance abuse/mental health issues will improve     Medication Management: Evaluate patient's response, side effects, and tolerance of medication regimen.  Therapeutic Interventions: 1 to 1 sessions, Unit Group sessions and Medication administration.  Evaluation of Outcomes: Not Progressing   RN Treatment Plan for Primary Diagnosis: MDD (major depressive disorder), recurrent severe, without psychosis (HCC) Long Term Goal(s): Knowledge of disease and therapeutic regimen to maintain health will improve  Short Term Goals: Ability to remain free from injury will improve, Ability to verbalize frustration  and anger appropriately will improve, Ability to demonstrate self-control, Ability to participate in decision making will improve, Ability to verbalize feelings will improve, Ability to disclose and discuss suicidal ideas, Ability to identify and develop effective coping behaviors will improve, and Compliance with prescribed medications will improve  Medication Management: RN will administer medications as ordered by provider, will assess and evaluate patient's response and provide education to patient for prescribed medication. RN will report any adverse and/or side effects to prescribing provider.  Therapeutic Interventions: 1 on 1 counseling sessions, Psychoeducation, Medication administration, Evaluate responses to treatment, Monitor vital signs and CBGs as ordered, Perform/monitor CIWA, COWS, AIMS and Fall Risk screenings as ordered, Perform wound care treatments as ordered.  Evaluation of Outcomes: Not Progressing   LCSW Treatment Plan for Primary Diagnosis: MDD (major depressive disorder), recurrent severe, without psychosis (HCC) Long Term Goal(s): Safe transition to appropriate next level of care at discharge, Engage patient in therapeutic group addressing interpersonal concerns.  Short Term Goals: Engage patient in aftercare planning with referrals and resources, Increase social support, Increase ability to appropriately verbalize feelings, Increase emotional regulation, Facilitate acceptance of mental health diagnosis and concerns, Facilitate patient progression through stages of change regarding substance use diagnoses and concerns, Identify triggers associated with mental health/substance abuse issues, and Increase skills for wellness and recovery  Therapeutic Interventions: Assess for all discharge needs, 1 to 1 time with Social worker, Explore available resources and support systems, Assess for adequacy in community support network, Educate family and significant other(s) on suicide  prevention, Complete Psychosocial Assessment, Interpersonal group therapy.  Evaluation of Outcomes: Not Progressing   Progress in Treatment: Attending groups: No. Participating in groups: No. Taking medication as prescribed: Yes. Toleration medication: Yes. Family/Significant other contact made: No, will contact:  Mom, dad or Granddad  Patient understands diagnosis: Yes. Discussing patient identified problems/goals with staff: Yes. Medical problems stabilized or resolved: Yes. Denies suicidal/homicidal ideation: Yes. Issues/concerns per patient self-inventory: No.   New problem(s) identified: No, Describe:  None reported   New Short Term/Long Term Goal(s):detox, medication management for mood stabilization; elimination of SI thoughts; development of comprehensive mental wellness/sobriety plan   Patient Goals:  " keep taking my medication and get on the greyhound to go to [facility]   Discharge Plan or Barriers: Patient recently admitted. CSW will continue to follow and assess for appropriate referrals and possible discharge planning.    Reason for Continuation of Hospitalization: Anxiety Depression Medication stabilization Suicidal ideation Withdrawal symptoms  Estimated Length of Stay: 2-3 days depending on when her  granddad get the greyhound ticket   Last 3 [place] Suicide Severity Risk Score: Flowsheet Row [facility] (Current[facility] Most recent reading at 11/04/2022  3:00 AM ED from 11/03/2022 in [facility] Emergency Department at [facility] Most recent reading at 11/03/2022 10:42 AM ED from 09/23/2022 in [facility] Emergency Department at [facility] Most recent reading at 09/23/2022  8:05 PM  C-SSRS RISK CATEGORY No Risk Error: Q3, 4, or 5 should not be populated when Q2 is No Error: Question 1 not populated       Last PHQ 2/9 Scores:    08/04/2021    5:09 PM 05/27/2021   10:53 AM 04/10/2021    9:57  AM  Depression screen PHQ 2/9  Decreased Interest 2 3 2   Down, Depressed, Hopeless 2 3 3   PHQ - 2 Score 4 6 5   Altered sleeping 2 3 3   Tired, decreased energy 2 3 3   Change in appetite 2 3 3   Feeling bad or failure about yourself  2 3 3   Trouble concentrating 2 3 3   Moving slowly or fidgety/restless 2 3 3   Suicidal thoughts 2 3 2   PHQ-9 Score 18 27 25   Difficult doing work/chores Very difficult Extremely dIfficult Somewhat difficult    Scribe for Treatment Team: Beather Arbour 11/05/2022 1:33 PM

## 2022-11-05 NOTE — [facility] Group Notes (Signed)
[facility] Group Notes:  (Nursing/MHT/Case Management/Adjunct)  Date:  11/05/2022  Time:  2000  Type of Therapy:   Alcoholics Anonymous Meeting  Participation Level:  Active  Participation Quality:  Appropriate, Attentive, Sharing, and Supportive  Affect:  Appropriate  Cognitive:  Alert  Insight:  Improving  Engagement in Group:  Engaged  Modes of Intervention:  Clarification, Education, and Support  Summary of Progress/Problems:  Marcille Buffy 11/05/2022, 10:10 PM

## 2022-11-05 NOTE — Progress Notes (Signed)
Pt denied SI/HI/AVH this morning. Pt rated her depression a 2/10, anxiety a 2/10, and feelings of hopelessness a 0/10. Pt reports that she slept "good" last night. Pt complains of stomach cramps from period as well as neck and back pain, rating her pain a 3/10. Pt reports that her goal for today is "to talk to social worker about long term options". Pt has been pleasant, calm, and cooperative throughout the shift. RN provided support and encouragement to patient. Pt given scheduled medications as prescribed. Q15 min checks verified for safety. Patient verbally contracts for safety. Patient compliant with medications and treatment plan. Patient is interacting well on the unit. Pt is safe on the unit.   11/05/22 0800  Psych Admission Type (Psych Patients Only)  Admission Status Voluntary  Psychosocial Assessment  Patient Complaints Anxiety;Depression  Eye Contact Fair  Facial Expression Anxious;Sad  Affect Appropriate to circumstance  Speech Logical/coherent  Interaction Assertive  Motor Activity Slow  Appearance/Hygiene Unremarkable  Behavior Characteristics Cooperative;Anxious  Mood Depressed;Anxious  Thought Process  Coherency WDL  Content WDL  Delusions None reported or observed  Perception WDL  Hallucination None reported or observed  Judgment Impaired  Confusion None  Danger to Self  Current suicidal ideation? Denies  Agreement Not to Harm Self Yes  Description of Agreement Verbal  Danger to Others  Danger to Others None reported or observed

## 2022-11-05 NOTE — Plan of Care (Signed)
  Problem: Education: Goal: Emotional status will improve Outcome: Progressing Goal: Mental status will improve Outcome: Progressing   Problem: Activity: Goal: Interest or engagement in activities will improve Outcome: Progressing Goal: Sleeping patterns will improve Outcome: Progressing

## 2022-11-05 NOTE — Progress Notes (Signed)
   11/05/22 0554  15 Minute Checks  Location Bedroom  Visual Appearance Calm  Behavior Sleeping  Sleep (Behavioral Health Patients Only)  Calculate sleep? (Click Yes once per 24 hr at 0600 safety check) Yes  Documented sleep last 24 hours 9.5

## 2022-11-05 NOTE — Plan of Care (Signed)
  Problem: Education: Goal: Knowledge of [facility] Education information/materials will improve Outcome: Progressing   Problem: Education: Goal: Emotional status will improve Outcome: Progressing   Problem: Activity: Goal: Sleeping patterns will improve Outcome: Progressing   Problem: Safety: Goal: Periods of time without injury will increase Outcome: Progressing

## 2022-11-05 NOTE — Progress Notes (Signed)
   11/04/22 2145  Psych Admission Type (Psych Patients Only)  Admission Status Voluntary  Psychosocial Assessment  Patient Complaints Anxiety  Eye Contact Fair  Facial Expression Anxious  Affect Appropriate to circumstance  Speech Logical/coherent  Interaction Assertive  Motor Activity Fidgety  Appearance/Hygiene Unremarkable  Behavior Characteristics Cooperative;Anxious  Mood Anxious  Thought Process  Coherency WDL  Content WDL  Delusions None reported or observed  Perception WDL  Hallucination None reported or observed  Judgment WDL  Confusion None  Danger to Self  Current suicidal ideation? Denies  Agreement Not to Harm Self Yes  Description of Agreement Verbal contract  Danger to Others  Danger to Others None reported or observed
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
## 2022-11-05 NOTE — Progress Notes (Signed)
[facility] LCSW Progress Note   11/05/2022    3:54 PM  [name]      Type of Note: Collateral With Grand dad ( Roger)   Pt granddad stated that he will get patient ticket tomorrow for Sunday to go [facility] to [facility]. Grand dad shared that he will have patient father help him with ordering ticket. Grand dad did state that if patient needed anything else to have her call her mom and if mom cannot bring it he will. CSW spoke with patient and gave her instructions on the plan for Sunday and to reach out to mom and grand dad about her belongings and [facility] to let them know she will get her ticket to leave out Sunday a little after 5:00 pm. Patient stated "ok, I will do that ".   Grand dad denied patient having access to a gun , although patient made a comment about having one for protection during her admission assessment.     Signed:   Jacob Moores, MSW, [facility] 11/05/2022 3:54 PM

## 2022-11-06 DIAGNOSIS — F332 Major depressive disorder, recurrent severe without psychotic features: Secondary | ICD-10-CM | POA: Diagnosis not present

## 2022-11-06 MED ORDER — ATOMOXETINE HCL 60 MG PO CAPS
60.0000 mg | ORAL_CAPSULE | Freq: Every day | ORAL | Status: DC
  Administered 2022-11-07: 60 mg via ORAL
  Filled 2022-11-06 (×3): qty 1

## 2022-11-06 NOTE — Progress Notes (Signed)
   11/06/22 0600  15 Minute Checks  Location Bedroom  Visual Appearance Calm  Behavior Composed  Sleep (Behavioral Health Patients Only)  Calculate sleep? (Click Yes once per 24 hr at 0600 safety check) Yes  Documented sleep last 24 hours 8

## 2022-11-06 NOTE — Plan of Care (Signed)
Problem: Education: Goal: Emotional status will improve Outcome: Progressing Goal: Mental status will improve Outcome: Progressing   Problem: Activity: Goal: Interest or engagement in activities will improve Outcome: Progressing

## 2022-11-06 NOTE — Group Note (Unsigned)
Date:  11/07/2022 Time:  2:41 AM  Group Topic/Focus:  Wrap-Up Group:   The focus of this group is to help patients review their daily goal of treatment and discuss progress on daily workbooks.    Participation Level:  Active  Participation Quality:  Appropriate and Sharing  Affect:  Appropriate  Cognitive:  Appropriate  Insight: Appropriate  Engagement in Group:  Engaged  Modes of Intervention:  Socialization  Additional Comments:  The patient stated that she had a "good day". The patient shared that she had a visit from her day and stated that the visit was good. The patient shared that she is being discharged on tomorrow. The patient stated that he goal/plan once discharged is to go to [place] (rehab program) and obtain custody of her son". The patient stated that she achieved her goal today which was to talk with social worker and doctor regarding preparation for discharge as well as getting information on medication. The patient rated her day a 10/10. The patient participated in the group activity.   Kennieth Francois 11/07/2022, 2:41 AM
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
## 2022-11-06 NOTE — Progress Notes (Signed)
   11/05/22 2200  Psych Admission Type (Psych Patients Only)  Admission Status Voluntary  Psychosocial Assessment  Patient Complaints Anxiety;Depression  Eye Contact Fair  Facial Expression Animated  Affect Appropriate to circumstance  Speech Logical/coherent  Interaction Assertive  Motor Activity Fidgety  Appearance/Hygiene Unremarkable  Behavior Characteristics Cooperative;Anxious  Mood Depressed;Anxious  Thought Process  Coherency WDL  Content WDL  Delusions None reported or observed  Perception WDL  Hallucination None reported or observed  Judgment Impaired  Confusion None  Danger to Self  Current suicidal ideation? Denies  Agreement Not to Harm Self Yes  Description of Agreement verbal  Danger to Others  Danger to Others None reported or observed
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
## 2022-11-06 NOTE — Plan of Care (Signed)
Problem: Education: Goal: Knowledge of [facility] Education information/materials will improve Outcome: Progressing   Problem: Education: Goal: Verbalization of understanding the information provided will improve Outcome: Progressing   Problem: Coping: Goal: Ability to verbalize frustrations and anger appropriately will improve Outcome: Progressing   Problem: Safety: Goal: Periods of time without injury will increase Outcome: Progressing

## 2022-11-07 DIAGNOSIS — F332 Major depressive disorder, recurrent severe without psychotic features: Secondary | ICD-10-CM | POA: Diagnosis not present

## 2022-11-07 MED ORDER — ATOMOXETINE HCL 60 MG PO CAPS: 60.0000 mg | ORAL_CAPSULE | Freq: Every day | ORAL | 0 refills | Status: DC

## 2022-11-07 MED ORDER — LORATADINE 10 MG PO TABS: 10.0000 mg | ORAL_TABLET | Freq: Every day | ORAL | 0 refills | Status: DC

## 2022-11-07 MED ORDER — STRESS FORMULA/ZINC PO TABS: 1.0000 | ORAL_TABLET | Freq: Every day | ORAL | 0 refills | Status: DC

## 2022-11-07 MED ORDER — NICOTINE 14 MG/24HR TD PT24: 14.0000 mg | MEDICATED_PATCH | Freq: Every day | TRANSDERMAL | 0 refills | Status: DC

## 2022-11-07 MED ORDER — NAPROXEN 500 MG PO TABS: 500.0000 mg | ORAL_TABLET | Freq: Two times a day (BID) | ORAL | 0 refills | Status: DC | PRN

## 2022-11-07 MED ORDER — TRAZODONE HCL 50 MG PO TABS: 50.0000 mg | ORAL_TABLET | Freq: Every evening | ORAL | 0 refills | Status: DC | PRN

## 2022-11-07 MED ORDER — PRAZOSIN HCL 1 MG PO CAPS: 1.0000 mg | ORAL_CAPSULE | Freq: Every day | ORAL | 0 refills | Status: DC

## 2022-11-07 MED ORDER — ARIPIPRAZOLE 5 MG PO TABS: 5.0000 mg | ORAL_TABLET | Freq: Every day | ORAL | 0 refills | Status: DC

## 2022-11-07 MED ORDER — TOPIRAMATE 25 MG PO TABS: 25.0000 mg | ORAL_TABLET | Freq: Two times a day (BID) | ORAL | 0 refills | Status: DC

## 2022-11-07 MED ORDER — LIDOCAINE 5 % EX PTCH: 2.0000 | MEDICATED_PATCH | Freq: Every day | CUTANEOUS | 0 refills | Status: DC

## 2022-11-07 NOTE — Progress Notes (Signed)
  [facility] Adult Case Management Discharge Plan :  Will you be returning to the same living situation after discharge:  No. Patient will be participating in treatment through [facility] [place] in [place], [place] At discharge, do you have transportation home?: No. Patient will be transported via taxi to [employer] you have the ability to pay for your medications: Yes,  Patient has medical coverage  Release of information consent forms completed and in the chart;  Patient's signature needed at discharge.  Patient to Follow up at:  Follow-up Information     [facility] Follow up.   Contact information: [facility] Follow up.  Why: Please continue with this provider for suboxone services. Contact information: Text or Call: [phone] Email: [email or web address] Fax: [phone] Mailing Address: [address] [place], [place] [address]        [facility]. Go on 12/06/2022.   Specialty: Behavioral Health Why: You have an appointment for medication management services on 12/06/22 at 11:00 am, in person.  You also have an appointment for therapy services on 01/10/23 at 11:00 am, in person.  For fastest service, please go to this provider Monday through Friday, arrive by 7:00 am for same day service.  * YOU MUST CONFIRM APPTS TO KEEP * Contact information: [address] [place] [address] [phone]        [facility] Follow up.   Specialty: Addiction Medicine Why: Referral made Contact information: [address] [place] [address] [phone]         [facility] Follow up.   Why: Referral made Contact information: [address] [place] [address] [phone]         [facility] Follow up.   Why: You may also call this provider for therapy and/or medication management services. Contact information: [address]  [address] [place]  [address] [phone]                 Next level of care provider has access to [facility] Link:no  Safety Planning and Suicide Prevention discussed: Yes,  Cinda Quest (Mother)     Has patient been referred to the Quitline?: Patient refused referral for treatment  Patient has been referred for addiction treatment: Patient refused referral for treatment.  Bridgett Larsson, LCSW 11/07/2022, 11:19 AM
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
## 2022-11-07 NOTE — Progress Notes (Signed)
Patient ID: [name], female   DOB: [birth year], 22 y.o.   MRN: [id number] Patient discharged to home/selfcare via taxi. Patient has a plan to go to [place] for sober living for recovery.

## 2022-11-07 NOTE — Progress Notes (Signed)
   11/06/22 2300  Psych Admission Type (Psych Patients Only)  Admission Status Voluntary  Psychosocial Assessment  Patient Complaints None  Eye Contact Fair  Facial Expression Animated  Affect Appropriate to circumstance  Speech Logical/coherent  Interaction Assertive  Motor Activity Fidgety  Appearance/Hygiene Unremarkable  Behavior Characteristics Cooperative;Anxious  Mood Pleasant  Thought Process  Coherency WDL  Content WDL  Delusions None reported or observed  Perception WDL  Hallucination None reported or observed  Judgment Impaired  Confusion None  Danger to Self  Current suicidal ideation? Denies  Agreement Not to Harm Self Yes  Description of Agreement verbal  Danger to Others  Danger to Others None reported or observed

## 2022-11-07 NOTE — Group Note (Signed)
Date:  11/07/2022 Time:  10:51 AM  Group Topic/Focus:  Goals Group:   The focus of this group is to help patients establish daily goals to achieve during treatment and discuss how the patient can incorporate goal setting into their daily lives to aide in recovery.    Participation Level:  Active  Participation Quality:  Attentive  Affect:  Appropriate  Cognitive:  Appropriate  Insight: Appropriate  Engagement in Group:  Monopolizing  Modes of Intervention:  Discussion  Additional Comments:     Reymundo Poll 11/07/2022, 10:51 AM
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
## 2022-11-07 NOTE — Progress Notes (Signed)
Patient ID: [name], female   DOB: September 13, 2000, 22 y.o.   MRN: [id number]  Patient discharged to home/self care on her own accord. Patient denies SI, HI and AVH upon discharge and understanding of all discharge instructions and receipt of personal belongings. Patient was excited to discharge to complete a rehabilitation program. Patient has a goal to start a sober living program.

## 2022-11-07 NOTE — [facility] Group Notes (Signed)
Type of Therapy and Topic:  Group Therapy: Gratitude  Participation Level:  Minimal   Description of Group:   In this group, patients shared and discussed the importance of acknowledging the elements in their lives for which they are grateful and how this can positively impact their mood.  The group discussed how bringing the positive elements of their lives to the forefront of their minds can help with recovery from any illness, physical or mental.  An exercise was done as a group in which a list was made of gratitude items in order to encourage participants to consider other potential positives in their lives.  Therapeutic Goals: Patients will identify one or more item for which they are grateful in each of 6 categories:  people, experiences, things, places, skills, and other. Patients will discuss how it is possible to seek out gratitude in even bad situations. Patients will explore other possible items of gratitude that they could remember.   Summary of Patient Progress:  The patient shared that she is grateful for family.  Patient's reaction to the group was pulled out by the nurse.  Therapeutic Modalities:   Solution-Focused Therapy Activity

## 2022-11-07 NOTE — Progress Notes (Signed)
   11/07/22 0556  15 Minute Checks  Location Bedroom  Visual Appearance Calm  Behavior Sleeping  Sleep (Behavioral Health Patients Only)  Calculate sleep? (Click Yes once per 24 hr at 0600 safety check) Yes  Documented sleep last 24 hours 7.5

## 2022-11-07 NOTE — Plan of Care (Signed)
Patient discharged to home/self care ?

## 2022-12-01 ENCOUNTER — Ambulatory Visit: Payer: MEDICAID

## 2022-12-02 ENCOUNTER — Encounter (HOSPITAL_COMMUNITY): Payer: Self-pay

## 2022-12-03 ENCOUNTER — Encounter (HOSPITAL_COMMUNITY): Payer: Self-pay

## 2022-12-06 ENCOUNTER — Encounter (HOSPITAL_COMMUNITY): Payer: Self-pay

## 2022-12-06 ENCOUNTER — Encounter (HOSPITAL_COMMUNITY): Payer: Self-pay | Admitting: Psychiatry

## 2022-12-06 ENCOUNTER — Ambulatory Visit (INDEPENDENT_AMBULATORY_CARE_PROVIDER_SITE_OTHER): Payer: MEDICAID | Admitting: Psychiatry

## 2022-12-06 DIAGNOSIS — F4310 Post-traumatic stress disorder, unspecified: Secondary | ICD-10-CM | POA: Diagnosis not present

## 2022-12-06 DIAGNOSIS — R41840 Attention and concentration deficit: Secondary | ICD-10-CM | POA: Diagnosis not present

## 2022-12-06 DIAGNOSIS — F1210 Cannabis abuse, uncomplicated: Secondary | ICD-10-CM

## 2022-12-06 DIAGNOSIS — F17210 Nicotine dependence, cigarettes, uncomplicated: Secondary | ICD-10-CM

## 2022-12-06 DIAGNOSIS — F314 Bipolar disorder, current episode depressed, severe, without psychotic features: Secondary | ICD-10-CM

## 2022-12-06 DIAGNOSIS — F411 Generalized anxiety disorder: Secondary | ICD-10-CM

## 2022-12-06 DIAGNOSIS — F17200 Nicotine dependence, unspecified, uncomplicated: Secondary | ICD-10-CM

## 2022-12-06 MED ORDER — BUSPIRONE HCL 10 MG PO TABS
10.0000 mg | ORAL_TABLET | Freq: Three times a day (TID) | ORAL | 3 refills | Status: DC
Start: 2022-12-06 — End: 2023-02-15

## 2022-12-06 MED ORDER — ARIPIPRAZOLE 10 MG PO TABS: 5.0000 mg | ORAL_TABLET | Freq: Every day | ORAL | 3 refills | Status: DC

## 2022-12-06 MED ORDER — PRAZOSIN HCL 1 MG PO CAPS: 1.0000 mg | ORAL_CAPSULE | Freq: Every day | ORAL | 3 refills | Status: DC

## 2022-12-06 MED ORDER — TOPIRAMATE 25 MG PO TABS
25.0000 mg | ORAL_TABLET | Freq: Two times a day (BID) | ORAL | 3 refills | Status: DC
Start: 2022-12-06 — End: 2023-03-25

## 2022-12-06 MED ORDER — TRAZODONE HCL 50 MG PO TABS
50.0000 mg | ORAL_TABLET | Freq: Every evening | ORAL | 3 refills | Status: DC | PRN
Start: 2022-12-06 — End: 2023-02-15

## 2022-12-06 MED ORDER — ATOMOXETINE HCL 60 MG PO CAPS: 60.0000 mg | ORAL_CAPSULE | Freq: Every day | ORAL | 3 refills | Status: DC

## 2022-12-06 MED ORDER — NICOTINE 14 MG/24HR TD PT24: 14.0000 mg | MEDICATED_PATCH | Freq: Every day | TRANSDERMAL | 3 refills | Status: DC
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
## 2022-12-20 ENCOUNTER — Ambulatory Visit (HOSPITAL_COMMUNITY)
Admission: EM | Admit: 2022-12-20 | Discharge: 2022-12-20 | Disposition: A | Discharge status: Home / Self Care | Payer: MEDICAID | Attending: Psychiatry | Admitting: Psychiatry

## 2022-12-20 DIAGNOSIS — Z789 Other specified health status: Secondary | ICD-10-CM

## 2022-12-20 DIAGNOSIS — F603 Borderline personality disorder: Secondary | ICD-10-CM | POA: Insufficient documentation

## 2022-12-20 DIAGNOSIS — Z5900 Homelessness unspecified: Secondary | ICD-10-CM | POA: Diagnosis not present

## 2022-12-20 DIAGNOSIS — F319 Bipolar disorder, unspecified: Secondary | ICD-10-CM | POA: Insufficient documentation

## 2022-12-20 NOTE — ED Provider Notes (Signed)
[facility] Medical Screening Exam  Patient Name: [name] MRN: [id number] Date of Evaluation: 12/20/22 Chief Complaint:   Diagnosis:  Final diagnoses:  Need for community resource   History of Present Illness  [name] is a 22 y.o. female with a past psychiatric history of unspecified bipolar disorder, GAD, PTSD, ADHD, and borderline personality disorder, x6 prior psychiatric hospitalizations (all at [facility]), x7 prior psychiatric urgent care / emergency department visits ([facility]) presenting voluntarily as a direct admit presenting by self from the community to Behavioral Health Urgent Care seeking resources for substance-abuse intensive outpatient services.  Patient says she has been homeless and went back to using substances (crack cocaine, cannabis, and alcohol). She said she is tired of living like this and would like to do better for her son. She reports she has just obtained a job working for [employer] at [employer] and would like to get her life together.  She says she has all her medication prescriptions and identifies her current regimen as beneficial for her mental health. Her goal is to achieve sobriety from substances and so would like to get connected to substance abuse intensive outpatient therapy.  She does not feel strongly that she needs to stay overnight for her substance use. She plans to live with her grandfather while attending [employer].  She is denying suicidal and homicidal thoughts. She denies hearing voices or seeing things that others do not see.  Psychiatric Specialty Exam Presentation  General Appearance: Appropriate for Environment; Well Groomed   Eye Contact:Good   Speech:Clear and Coherent; Normal Rate   Speech Volume:Normal   Handedness:Right   Mood and Affect  Mood:-- ("I'm so tired of living like this")   Affect:Appropriate; Congruent; Full Range; Tearful Runner, broadcasting/film/video)   Thought Process  Thought  Processes:Coherent; Linear; Goal Directed   Descriptions of Associations:Intact   Orientation:Full (Time, Place and Person)   Thought Content:Logical; WDL  Diagnosis of Schizophrenia or Schizoaffective disorder in past: No    Hallucinations:Hallucinations: None   Ideas of Reference:None   Suicidal Thoughts:Suicidal Thoughts: No   Homicidal Thoughts:Homicidal Thoughts: No   Sensorium  Memory:Immediate Good; Recent Good; Remote Good   Judgment:Poor   Insight:Fair   Executive Functions  Concentration:Good   Attention Span:Good   Recall:Good   Fund of Knowledge:Good   Language:Good   Psychomotor Activity  Psychomotor Activity:Psychomotor Activity: Normal   Assets  Assets:Resilience   Sleep  Sleep:Sleep: Good   Number of hours: No data recorded  Physical Exam: Physical Exam Vitals and nursing note reviewed.  HENT:     Head: Normocephalic and atraumatic.  Pulmonary:     Effort: Pulmonary effort is normal.  Musculoskeletal:     Cervical back: Normal range of motion.  Neurological:     General: No focal deficit present.     Mental Status: She is alert.    Review of Systems  Constitutional: Negative.   Respiratory: Negative.    Cardiovascular: Negative.   Gastrointestinal: Negative.   Genitourinary: Negative.   Psychiatric/Behavioral:         Psychiatric subjective data addressed in PSE or HPI / daily subjective report   Blood pressure (!) 132/90, pulse (!) 103, temperature 98.4 F (36.9 C), temperature source Oral, resp. rate 18, SpO2 100%. There is no height or weight on file to calculate BMI.  Musculoskeletal: Strength & Muscle Tone: within normal limits Gait & Station: normal Patient leans: N/A  [facility] MSE Discharge Disposition for Follow up and Recommendations: Based  on my evaluation the patient does not appear to have an emergency medical condition and can be discharged with resources and follow up care in outpatient  services for Medication Management, Substance Abuse Intensive Outpatient Program, Individual Therapy, and Group Therapy  Patient not currently in a mental health crisis. Not a danger to self or others. She can benefit from referral to substance abuse intensive outpatient therapy.  Augusto Gamble, MD 12/20/2022, 2:58 PM
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
## 2022-12-20 NOTE — Discharge Instructions (Addendum)
Dear [name],  It was a pleasure to take care of you during your visit at [facility]) where you were provided outpatient resources.   You have been referred to the [facility] CD-IOP(Chemical-Dependency Intensive Outpatient Treatment) program.  If you don't hear from Jeri Modena or another staff by 10 am tomorrow, please call the clinic to schedule your intake. Call [phone].   I recommend abstinence from alcohol, tobacco, and other illicit drug use.   If your psychiatric symptoms or suicidal thoughts recur, worsen, or if you have side effects to your psychiatric medications, call your outpatient psychiatric provider, 911, [phone] or go to the nearest emergency department.  Take care!  Signed: Augusto Gamble, MD 12/20/2022, 2:36 PM  Naloxone (Narcan) can help reverse an overdose when given to the victim quickly.  [facility] offers free naloxone kits and instructions/training on its use.  Add naloxone to your first aid kit and you can help save a life. A prescription can be filled at your local pharmacy or free kits are provided by the county.  Pick up your free kit at the following locations:   [place]:  [facility], [address] [place] [address] [phone]) [facility] [address] [place] [address] [phone]) [facility] [address] [place] [address]  [place]: [facility] Division [facility] [address] [place] [address] ([phone]) [facility] [address] [place] [address] [phone])

## 2022-12-22 ENCOUNTER — Ambulatory Visit (INDEPENDENT_AMBULATORY_CARE_PROVIDER_SITE_OTHER): Payer: MEDICAID

## 2022-12-22 DIAGNOSIS — F1420 Cocaine dependence, uncomplicated: Secondary | ICD-10-CM | POA: Diagnosis not present

## 2022-12-22 DIAGNOSIS — F1994 Other psychoactive substance use, unspecified with psychoactive substance-induced mood disorder: Secondary | ICD-10-CM

## 2022-12-22 DIAGNOSIS — F1220 Cannabis dependence, uncomplicated: Secondary | ICD-10-CM

## 2022-12-22 DIAGNOSIS — F411 Generalized anxiety disorder: Secondary | ICD-10-CM

## 2022-12-22 DIAGNOSIS — F1020 Alcohol dependence, uncomplicated: Secondary | ICD-10-CM
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
## 2022-12-27 ENCOUNTER — Encounter: Payer: Self-pay | Admitting: Obstetrics and Gynecology

## 2022-12-27 ENCOUNTER — Inpatient Hospital Stay (HOSPITAL_COMMUNITY)
Admission: AD | Admit: 2022-12-27 | Discharge: 2022-12-27 | Discharge status: Against Medical Advice | Payer: MEDICAID | Attending: Obstetrics and Gynecology | Admitting: Obstetrics and Gynecology

## 2022-12-27 ENCOUNTER — Encounter (HOSPITAL_COMMUNITY): Payer: Self-pay | Admitting: Obstetrics and Gynecology

## 2022-12-27 DIAGNOSIS — Z5329 Procedure and treatment not carried out because of patient's decision for other reasons: Secondary | ICD-10-CM | POA: Diagnosis not present

## 2022-12-27 DIAGNOSIS — Z3202 Encounter for pregnancy test, result negative: Secondary | ICD-10-CM | POA: Insufficient documentation

## 2022-12-27 DIAGNOSIS — Z5321 Procedure and treatment not carried out due to patient leaving prior to being seen by health care provider: Secondary | ICD-10-CM | POA: Diagnosis not present

## 2022-12-27 DIAGNOSIS — Z789 Other specified health status: Secondary | ICD-10-CM

## 2022-12-27 LAB — POCT PREGNANCY, URINE: Preg Test, Ur: NEGATIVE

## 2022-12-27 LAB — HCG, QUANTITATIVE, PREGNANCY: hCG, Beta Chain, Quant, S: 1 m[IU]/mL (ref <5)
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
## 2022-12-27 NOTE — MAU Note (Signed)
Patient left AMA. Signed form. CNM made aware.
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
## 2023-01-06 ENCOUNTER — Encounter (HOSPITAL_COMMUNITY): Payer: Self-pay

## 2023-01-07 ENCOUNTER — Encounter (HOSPITAL_COMMUNITY): Payer: Self-pay

## 2023-01-10 ENCOUNTER — Ambulatory Visit (HOSPITAL_COMMUNITY): Payer: MEDICAID | Admitting: Clinical

## 2023-01-11 ENCOUNTER — Encounter (HOSPITAL_COMMUNITY): Payer: Self-pay

## 2023-01-11 ENCOUNTER — Other Ambulatory Visit: Payer: Self-pay

## 2023-01-11 ENCOUNTER — Emergency Department (HOSPITAL_COMMUNITY)
Admission: EM | Admit: 2023-01-11 | Discharge: 2023-01-11 | Disposition: A | Discharge status: Home / Self Care | Payer: MEDICAID | Attending: Emergency Medicine | Admitting: Emergency Medicine

## 2023-01-11 DIAGNOSIS — M542 Cervicalgia: Secondary | ICD-10-CM | POA: Insufficient documentation

## 2023-01-11 DIAGNOSIS — Z5321 Procedure and treatment not carried out due to patient leaving prior to being seen by health care provider: Secondary | ICD-10-CM | POA: Diagnosis not present

## 2023-01-11 DIAGNOSIS — M549 Dorsalgia, unspecified: Secondary | ICD-10-CM | POA: Insufficient documentation
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
## 2023-01-11 NOTE — ED Notes (Addendum)
No answer in lobby x 3, assumed LWBS

## 2023-01-11 NOTE — ED Notes (Signed)
Pt upset about wait stating she is excruciating pain and needs to lie down. I informed pt I don't have a room for her at this time. She wants her blood drawn and a urine test but I told her I did not know what the EDP wanted done for her. She then tells be she may need to speak with psych because she missed her therapy appt and her anxiety is very high.

## 2023-01-26 NOTE — L&D Delivery Note (Addendum)
 OB/GYN Faculty Practice Delivery Note  [name] is a 23 y.o. G3P1011 at [redacted]w[redacted]d by US . She was admitted for NRFHR.   ROM: 6h 45m with clear fluid GBS Status: positive. PCN Maximum Maternal Temperature: 98.2  Labor Progress: Patient feeling more pressure with contractions.  Delivery Date/Time: 11/30/2023 @ [phone] Delivery: Called to room and patient was complete and pushing. Head delivered LOA. Compound left hand initially created dystocia.  Attempt to deliver left arm prior to delivery of shoulders unsuccessful.  Slow delivery of anterior shoulder with gentle downward traction, no other maneuvers required. Infant with spontaneous cry, placed on mother's abdomen, dried and stimulated. Minimal response from infant so cord clamped and cut by SNM for infant to be evaluated at warmer for further assessment. Cord blood drawn. Placenta delivered spontaneously, intact, with 3-vessel cord. Fundus firm with massage and Pitocin. Labia, perineum, vagina, and cervix inspected, no laceration identified.   Placenta: to L&D Complications: none Lacerations: no laceration identified  EBL: 173 Analgesia: epidural  Postpartum Planning [x ] transfer orders to MB [x ] discharge summary started & shared [x ] message to sent to schedule follow-up  [x]  lists updated  Infant: female  APGARs 7/9  3160g  Conard Me, SNM, RNC-OB Student Nurse Midwife 11/30/2023 7:12 AM    Midwife attestation: I was gloved and present for delivery in its entirety and I agree with the above midwife student's note.  Olam Boards, CNM 9:04 AM

## 2023-02-15 ENCOUNTER — Telehealth (HOSPITAL_COMMUNITY): Payer: MEDICAID | Admitting: Psychiatry

## 2023-02-15 ENCOUNTER — Encounter (HOSPITAL_COMMUNITY): Payer: Self-pay | Admitting: Psychiatry

## 2023-02-15 DIAGNOSIS — F314 Bipolar disorder, current episode depressed, severe, without psychotic features: Secondary | ICD-10-CM

## 2023-02-15 DIAGNOSIS — F411 Generalized anxiety disorder: Secondary | ICD-10-CM | POA: Diagnosis not present

## 2023-02-15 DIAGNOSIS — F1411 Cocaine abuse, in remission: Secondary | ICD-10-CM | POA: Diagnosis not present

## 2023-02-15 DIAGNOSIS — F4310 Post-traumatic stress disorder, unspecified: Secondary | ICD-10-CM | POA: Diagnosis not present

## 2023-02-15 MED ORDER — TRAZODONE HCL 50 MG PO TABS: 50.0000 mg | ORAL_TABLET | Freq: Every evening | ORAL | 3 refills | Status: DC | PRN

## 2023-02-15 MED ORDER — ARIPIPRAZOLE 5 MG PO TABS: 5.0000 mg | ORAL_TABLET | Freq: Every day | ORAL | 3 refills | Status: DC

## 2023-02-15 MED ORDER — BUSPIRONE HCL 10 MG PO TABS
10.0000 mg | ORAL_TABLET | Freq: Three times a day (TID) | ORAL | 3 refills | Status: DC
Start: 2023-02-15 — End: 2023-03-25

## 2023-02-15 NOTE — Progress Notes (Signed)
BH MD/PA/NP OP Progress Note Virtual Visit via Video Note  I connected with [name] on 02/15/23 at  2:30 PM EST by a video enabled telemedicine application and verified that I am speaking with the correct person using two identifiers.  Location: Patient: Home Provider: Clinic   I discussed the limitations of evaluation and management by telemedicine and the availability of in person appointments. The patient expressed understanding and agreed to proceed.  I provided 30 minutes of non-face-to-face time during this encounter.   02/15/2023 3:19 PM [name]  MRN:  [id number]  Chief Complaint: "I am irritable and mad"  HPI: 23 year old female seen today for follow up psychiatric evaluation.  She has a psychiatric history of PTSD, GAD, polysubstance (alcohol, cannabis methamphetamines, stimulants, and cocaine), bipolar affective disorder, borderline personality disorder, SI, panic and insomnia.  Currently she is managed on Nicorette CQ 21 mg patches daily, BuSpar 10 mg three times daily, Abilify 5 mg daily, prazosin 1 mg nightly, trazodone 50 mg nightly as needed, Topamax 25 mg twice daily, and Strattera 60 mg daily.  Patient also notes that she discontinued all of her medications and does not feel mentally stable.    Today she is well groomed, pleasant, cooperative, and engaged in conversation. She informed Clinical research associate that she is irritable and mad.  Patient describes symptoms of hypomania.  She reports that she is irritable, distracted, having racing thoughts, fluctuations in mood, impulsive behaviors, and poor sleep.  Patient notes at times she sleeps 3 to 6 hours and at other times she reports that she is up for days.  Patient informed Clinical research associate that she has a stigma about taking medications.  She notes that she is African-American female who was community at times does regards to mental illnesses.  She notes that she hears her mother telling her that medications are needed but religion is.  Patient  also notes that she is fearful that she will be dependent on another substance.  She inform her that she has been sober from cocaine and other illegal substances for 8 weeks and 2 days.  She notes that she tends in a regularly and has a sponsor.   Since her last visit she informed that her anxiety and depression continues to be problematic.  Today provider conducted GAD-7 and patient scored 21, at her last visit she scored a 19.  Provider also conducted. The patient scored a 22, at her last visit he scored 18.  He endorses fluctuations in sleep.  Patient notes that since discontinuing illegal substances she has been doing great 5 pounds and reports that her appetite is adequate.  At times patient notes that she feels paranoid but denies hallucinations.  Today she endorses passive SI but denies wanting to harm herself.  Today she denies SI/HI/VAH.    Patient informed Clinical research associate that financially she is stressed that she does not have a current job.  She notes that her significant other and her friends help her financially.  Patient notes that she wants her mood to be better managed so that she can be a better mother to her 2-year-old son.  Patient informed Clinical research associate that she may be pregnant at this time but notes that when she took a pregnancy test that was negative.  Patient informed Clinical research associate that she does not wish to restart all of her medications (BuSpar, Nicorette patches, prazosin, or Topamax).  She was agreeable to restarting Abilify 5 mg to help manage mood, Strattera 60 mg to help manage symptoms of ADHD,  and trazodone 50 mg as nightly to help manage sleep.  She will follow-up for further assessment in 2 months.  No other concerns at this time.  Visit Diagnosis:    ICD-10-CM   1. Cocaine use disorder, mild, in early remission (HCC)  F14.11     2. Generalized anxiety disorder  F41.1 traZODone (DESYREL) 50 MG tablet    busPIRone (BUSPAR) 10 MG tablet    3. PTSD (post-traumatic stress disorder)  F43.10  traZODone (DESYREL) 50 MG tablet    4. Severe bipolar I disorder, most recent episode depressed (HCC)  F31.4 ARIPiprazole (ABILIFY) 5 MG tablet      Past Psychiatric History: PTSD, GAD, polysubstance (alcohol, cannabis methamphetamines, stimulants, and cocaine), bipolar affective disorder, borderline personality disorder, SI, panic and insomnia.   Past Medical History:  Past Medical History:  Diagnosis Date   Anxiety    Asthma    Headache(784.0)    Hx of suicide attempt    Major depressive disorder    Morbid obesity (HCC) 03/25/2020   PTSD (post-traumatic stress disorder)     Past Surgical History:  Procedure Laterality Date   NO PAST SURGERIES     wisdom tooth extraction      Family Psychiatric History: Maternal grandmother bipolar, mother alcohol use, father marijuana use, son ADHD, maternal aunt bipolar disorder, substance use on maternal and paternal side   Family History:  Family History  Problem Relation Age of Onset   Diabetes Maternal Aunt    Diabetes Maternal Grandmother    Cancer Maternal Grandmother    Breast cancer Maternal Grandmother    Breast cancer Other     Social History:  Social History   Socioeconomic History   Marital status: Single    Spouse name: Not on file   Number of children: 2   Years of education: Not on file   Highest education level: Some college, no degree  Occupational History   Not on file  Tobacco Use   Smoking status: Every Day    Current packs/day: 0.50    Average packs/day: 0.5 packs/day for 4.0 years (2.0 ttl pk-yrs)    Types: Cigarettes, Cigars    Passive exposure: Past   Smokeless tobacco: Never   Tobacco comments:    black and mild with THC  Vaping Use   Vaping status: Never Used  Substance and Sexual Activity   Alcohol use: Yes   Drug use: Not Currently    Types: Marijuana, MDMA (Ecstacy), Cocaine    Comment: Coc-last use 2/27; marijuana and MDMA last use 2/28   Sexual activity: Yes    Partners: Male    Birth  control/protection: None  Other Topics Concern   Not on file  Social History Narrative   Pt and family are homeless. 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   Social Drivers of Corporate investment banker Strain: Not on file  Food Insecurity: No Food Insecurity (11/04/2022)   Hunger Vital Sign    Worried About Running Out of Food in the Last Year: Never true    Ran Out of Food in the Last Year: Never true  Transportation Needs: No Transportation Needs (11/04/2022)   PRAPARE - Administrator, Civil Service (Medical): No    Lack of Transportation (Non-Medical): No  Physical Activity: Not  on file  Stress: Not on file  Social Connections: Unknown (12/21/2021)   [facility]   Social Connections    Frequency of Communication with Friends and Family: Not asked    Frequency of Social Gatherings with Friends and Family: Not asked    Allergies:  Allergies  Allergen Reactions   Lactose Intolerance (Gi) Nausea And Vomiting and Other (See Comments)   Tape Other (See Comments)    Slight skin irritation    Metabolic Disorder Labs: Lab Results  Component Value Date   HGBA1C 5.2 08/15/2022   MPG 102.54 08/15/2022   MPG 108 04/13/2022   Lab Results  Component Value Date   PROLACTIN 6.6 12/28/2018   PROLACTIN 5.8 08/23/2017   Lab Results  Component Value Date   CHOL 155 08/15/2022   TRIG 50 08/15/2022   HDL 44 08/15/2022   CHOLHDL 3.5 08/15/2022   VLDL 10 08/15/2022   LDLCALC 101 (H) 08/15/2022   LDLCALC 114 (H) 04/13/2022   Lab Results  Component Value Date   TSH 0.391 08/15/2022   TSH 0.441 04/13/2022    Therapeutic Level Labs: No results found for: "LITHIUM" No results found for: "VALPROATE" No results found for: "CBMZ"  Current Medications: Current Outpatient Medications  Medication  Sig Dispense Refill   ARIPiprazole (ABILIFY) 5 MG tablet Take 1 tablet (5 mg total) by mouth daily. 30 tablet 3   atomoxetine (STRATTERA) 60 MG capsule Take 1 capsule (60 mg total) by mouth daily. 30 capsule 3   busPIRone (BUSPAR) 10 MG tablet Take 1 tablet (10 mg total) by mouth 3 (three) times daily. 90 tablet 3   lidocaine (LIDODERM) 5 % Place 2 patches onto the skin daily. Remove & Discard patch within 12 hours or as directed by MD 60 patch 0   loratadine (CLARITIN) 10 MG tablet Take 1 tablet (10 mg total) by mouth daily. 30 tablet 0   Multiple Vitamins-Minerals (B COMPLEX-C-E-ZINC) tablet Take 1 tablet by mouth daily. 30 tablet 0   naproxen (NAPROSYN) 500 MG tablet Take 1 tablet (500 mg total) by mouth 2 (two) times daily as needed (aching, pain, or discomfort). 30 tablet 0   nicotine (NICODERM CQ - DOSED IN MG/24 HOURS) 14 mg/24hr patch Place 1 patch (14 mg total) onto the skin daily. (Patient not taking: Reported on 12/20/2022) 30 patch 3   prazosin (MINIPRESS) 1 MG capsule Take 1 capsule (1 mg total) by mouth at bedtime. 30 capsule 3   topiramate (TOPAMAX) 25 MG tablet Take 1 tablet (25 mg total) by mouth 2 (two) times daily. 60 tablet 3   traZODone (DESYREL) 50 MG tablet Take 1 tablet (50 mg total) by mouth at bedtime as needed for sleep. 30 tablet 3   No current facility-administered medications for this visit.     Musculoskeletal: Strength & Muscle Tone: within normal limits and Telehealth visit Gait & Station: normal, Telehealth visit Patient leans: N/A  Psychiatric Specialty Exam: Review of Systems  There were no vitals taken for this visit.There is no height or weight on file to calculate BMI.  General Appearance: Well Groomed  Eye Contact:  Good  Speech:  Clear and Coherent and Normal Rate  Volume:  Normal  Mood:  Anxious and Depressed  Affect:  Appropriate and Congruent  Thought Process:  Coherent, Goal Directed, and Linear  Orientation:  Full (Time, Place, and Person)   Thought Content: Logical and Paranoid Ideation   Suicidal Thoughts:  Yes.  without intent/plan  Homicidal Thoughts:  No  Memory:  Immediate;   Good Recent;   Good Remote;   Good  Judgement:  Good  Insight:  Good  Psychomotor Activity:  Normal  Concentration:  Concentration: Fair and Attention Span: Fair  Recall:  [employer] of Knowledge: Good  Language: Good  Akathisia:  No  Handed:  Right  AIMS (if indicated): not done  Assets:  Communication Skills Desire for Improvement Housing Intimacy Physical Health Social Support  ADL's:  Intact  Cognition: WNL  Sleep:  Poor   Screenings: AIMS    Flowsheet Row [facility] (Dis[facility] (Discharged) from [facility] Admission (Discharged) from [facility] (Dis[facility]) [facility]  AIMS Total Score 0 0 0 0      AUDIT    Flowsheet Row [facility]) [facility] Ad[facility] (Discharged) [facility] Ad[facility] (Discharged) from [facility] Ad[facility] (Discharged) [facility] Admission (Discharged) from OP [facility]  Alcohol Use Disorder Identification Test Final Score (AUDIT) 2 0 0 0 15      GAD-7    Flowsheet Row Video Visit from 02/15/2023 in [facility] Counselor from 12/22/2022 in [facility] Office Visit from 12/06/2022 in [facility] Clinical Support from 05/27/2021 in [facility] Clinical  Support from 04/10/2021 in [facility]  Total GAD-7 Score 21 19 19 21 21       PHQ2-9    Flowsheet Row Video Visit from 02/15/2023 in [facility] Counselor from 12/22/2022 in [facility] Office Visit from 12/06/2022 in [facility] ED from 08/04/2021 in [facility] Clinical Support from 05/27/2021 in [facility]  PHQ-2 Total Score 4 6 3 4 6   PHQ-9 Total Score 22 25 18 18 27       Flowsheet Row Video Visit from 02/15/2023 in [facility] ED from 01/11/2023 in [facility] Department at [facility] Admission (Discharged) from 12/27/2022 in [facility]  C-SSRS RISK CATEGORY Error: Q7 should not be populated when Q6 is No Error: Q7 should not be populated when Q6 is No Moderate Risk        Assessment and Plan: Patient endorses increased anxiety, depression, hypomania, and fluctuations in sleep. Patient informed Clinical research associate that she does not wish to restart all of her medications (BuSpar, Nicorette patches, prazosin, or Topamax).  She was agreeable to restarting Abilify 5 mg to help manage mood, Strattera 60 mg to help manage symptoms of ADHD, and trazodone 50 mg as nightly to help manage sleep.  She will follow-up for further assessment in 2 months.  1. Generalized anxiety disorder  Restart- traZODone (DESYREL) 50 MG tablet; Take 1 tablet (50 mg total) by mouth at bedtime as needed for sleep.  Dispense: 30 tablet; Refill: 3 Restart- busPIRone (BUSPAR) 10 MG tablet; Take 1 tablet (10 mg total) by mouth 3 (three) times daily.  Dispense: 90 tablet; Refill: 3  2. PTSD (post-traumatic stress  disorder)  Restart- traZODone (DESYREL) 50 MG tablet; Take 1 tablet (50 mg total) by mouth at bedtime as needed for sleep.  Dispense: 30 tablet; Refill: 3  3. Severe bipolar I  disorder, most recent episode depressed (HCC)  Restart- ARIPiprazole (ABILIFY) 5 MG tablet; Take 1 tablet (5 mg total) by mouth daily.  Dispense: 30 tablet; Refill: 3  4. Cocaine use disorder, mild, in early remission (HCC) (Primary)     Collaboration of Care: Collaboration of Care: Other provider involved in patient's care AEB PCP  Patient/Guardian was advised Release of Information must be obtained prior to any record release in order to collaborate their care with an outside provider. Patient/Guardian was advised if they have not already done so to contact the registration department to sign all necessary forms in order for [place] to release information regarding their care.   Consent: Patient/Guardian gives verbal consent for treatment and assignment of benefits for services provided during this visit. Patient/Guardian expressed understanding and agreed to proceed.   Follow up in 2.5 months Follow up with NA Follow up with threapy  Shanna Cisco, NP 02/15/2023, 3:19 PM

## 2023-03-01 ENCOUNTER — Telehealth (HOSPITAL_COMMUNITY): Payer: Self-pay | Admitting: *Deleted

## 2023-03-01 ENCOUNTER — Telehealth (HOSPITAL_COMMUNITY): Payer: Self-pay | Admitting: Clinical

## 2023-03-01 ENCOUNTER — Ambulatory Visit (HOSPITAL_COMMUNITY): Payer: MEDICAID | Admitting: Clinical

## 2023-03-01 ENCOUNTER — Telehealth (HOSPITAL_COMMUNITY): Payer: Self-pay

## 2023-03-01 ENCOUNTER — Encounter (HOSPITAL_COMMUNITY): Payer: Self-pay

## 2023-03-01 NOTE — Telephone Encounter (Signed)
Medication managment - Prior authorization for pt's prescribed Aripiprazole 5 mg, one a day, #30 for 30 days submitted online with CoverMyMeds and sent to pt's[employer], with [employer] for review and pending decision.

## 2023-03-01 NOTE — Telephone Encounter (Signed)
Fax received for approval of Aripiprazole 5mg  until 02/29/24. Called to notify pharmacy.

## 2023-03-01 NOTE — Telephone Encounter (Signed)
 Thank you for this update

## 2023-03-01 NOTE — Telephone Encounter (Signed)
 Therapist sent the client a link for the scheduled therapy appt. Client did not check in for the video appt. Therapist attempted to call the client by phone, she did not answer. Therapist left a voicemail instructing her to call the office back if she would like to reschedule for therapy.

## 2023-03-16 ENCOUNTER — Other Ambulatory Visit: Payer: Self-pay

## 2023-03-16 ENCOUNTER — Inpatient Hospital Stay (HOSPITAL_COMMUNITY)
Admission: AD | Admit: 2023-03-16 | Discharge: 2023-03-25 | DRG: 885 | Disposition: A | Discharge status: Home / Self Care | Payer: MEDICAID | Source: Intra-hospital | Attending: Psychiatry | Admitting: Psychiatry

## 2023-03-16 ENCOUNTER — Ambulatory Visit (HOSPITAL_COMMUNITY)
Admission: EM | Admit: 2023-03-16 | Discharge: 2023-03-16 | Disposition: A | Discharge status: Other Acute Inpatient Hospital | Payer: MEDICAID | Attending: Otolaryngology | Admitting: Otolaryngology

## 2023-03-16 ENCOUNTER — Encounter (HOSPITAL_COMMUNITY): Payer: Self-pay | Admitting: Family Medicine

## 2023-03-16 DIAGNOSIS — G4700 Insomnia, unspecified: Secondary | ICD-10-CM | POA: Diagnosis present

## 2023-03-16 DIAGNOSIS — F4310 Post-traumatic stress disorder, unspecified: Secondary | ICD-10-CM | POA: Diagnosis present

## 2023-03-16 DIAGNOSIS — S060XAD Concussion with loss of consciousness status unknown, subsequent encounter: Secondary | ICD-10-CM

## 2023-03-16 DIAGNOSIS — F901 Attention-deficit hyperactivity disorder, predominantly hyperactive type: Secondary | ICD-10-CM | POA: Diagnosis present

## 2023-03-16 DIAGNOSIS — M4802 Spinal stenosis, cervical region: Secondary | ICD-10-CM | POA: Diagnosis present

## 2023-03-16 DIAGNOSIS — Z9142 Personal history of forced labor or sexual exploitation: Secondary | ICD-10-CM

## 2023-03-16 DIAGNOSIS — F3160 Bipolar disorder, current episode mixed, unspecified: Secondary | ICD-10-CM | POA: Insufficient documentation

## 2023-03-16 DIAGNOSIS — Z630 Problems in relationship with spouse or partner: Secondary | ICD-10-CM

## 2023-03-16 DIAGNOSIS — R45851 Suicidal ideations: Secondary | ICD-10-CM | POA: Diagnosis present

## 2023-03-16 DIAGNOSIS — F17213 Nicotine dependence, cigarettes, with withdrawal: Secondary | ICD-10-CM | POA: Diagnosis present

## 2023-03-16 DIAGNOSIS — S12600D Unspecified displaced fracture of seventh cervical vertebra, subsequent encounter for fracture with routine healing: Secondary | ICD-10-CM | POA: Diagnosis not present

## 2023-03-16 DIAGNOSIS — Z5900 Homelessness unspecified: Secondary | ICD-10-CM

## 2023-03-16 DIAGNOSIS — F1210 Cannabis abuse, uncomplicated: Secondary | ICD-10-CM | POA: Diagnosis present

## 2023-03-16 DIAGNOSIS — Z79899 Other long term (current) drug therapy: Secondary | ICD-10-CM

## 2023-03-16 DIAGNOSIS — F410 Panic disorder [episodic paroxysmal anxiety] without agoraphobia: Secondary | ICD-10-CM | POA: Diagnosis present

## 2023-03-16 DIAGNOSIS — F17210 Nicotine dependence, cigarettes, uncomplicated: Secondary | ICD-10-CM | POA: Diagnosis present

## 2023-03-16 DIAGNOSIS — R45850 Homicidal ideations: Secondary | ICD-10-CM | POA: Diagnosis present

## 2023-03-16 DIAGNOSIS — G43909 Migraine, unspecified, not intractable, without status migrainosus: Secondary | ICD-10-CM | POA: Diagnosis present

## 2023-03-16 DIAGNOSIS — F17290 Nicotine dependence, other tobacco product, uncomplicated: Secondary | ICD-10-CM | POA: Diagnosis present

## 2023-03-16 DIAGNOSIS — E041 Nontoxic single thyroid nodule: Secondary | ICD-10-CM | POA: Diagnosis present

## 2023-03-16 DIAGNOSIS — Z833 Family history of diabetes mellitus: Secondary | ICD-10-CM

## 2023-03-16 DIAGNOSIS — T7411XD Adult physical abuse, confirmed, subsequent encounter: Secondary | ICD-10-CM

## 2023-03-16 DIAGNOSIS — Z716 Tobacco abuse counseling: Secondary | ICD-10-CM

## 2023-03-16 DIAGNOSIS — Z6372 Alcoholism and drug addiction in family: Secondary | ICD-10-CM | POA: Diagnosis not present

## 2023-03-16 DIAGNOSIS — Z9151 Personal history of suicidal behavior: Secondary | ICD-10-CM | POA: Insufficient documentation

## 2023-03-16 DIAGNOSIS — F3163 Bipolar disorder, current episode mixed, severe, without psychotic features: Principal | ICD-10-CM | POA: Diagnosis present

## 2023-03-16 DIAGNOSIS — F1510 Other stimulant abuse, uncomplicated: Secondary | ICD-10-CM | POA: Diagnosis present

## 2023-03-16 DIAGNOSIS — G8929 Other chronic pain: Secondary | ICD-10-CM | POA: Diagnosis present

## 2023-03-16 DIAGNOSIS — Z5941 Food insecurity: Secondary | ICD-10-CM | POA: Diagnosis not present

## 2023-03-16 DIAGNOSIS — F411 Generalized anxiety disorder: Secondary | ICD-10-CM

## 2023-03-16 DIAGNOSIS — Z803 Family history of malignant neoplasm of breast: Secondary | ICD-10-CM

## 2023-03-16 DIAGNOSIS — Z634 Disappearance and death of family member: Secondary | ICD-10-CM

## 2023-03-16 DIAGNOSIS — Z91148 Patient's other noncompliance with medication regimen for other reason: Secondary | ICD-10-CM | POA: Insufficient documentation

## 2023-03-16 DIAGNOSIS — F603 Borderline personality disorder: Secondary | ICD-10-CM | POA: Insufficient documentation

## 2023-03-16 DIAGNOSIS — Z889 Allergy status to unspecified drugs, medicaments and biological substances status: Secondary | ICD-10-CM

## 2023-03-16 DIAGNOSIS — R41840 Attention and concentration deficit: Secondary | ICD-10-CM

## 2023-03-16 DIAGNOSIS — Z811 Family history of alcohol abuse and dependence: Secondary | ICD-10-CM

## 2023-03-16 LAB — CBC WITH DIFFERENTIAL/PLATELET
Abs Immature Granulocytes: 0.02 10*3/uL (ref 0.00–0.07)
Basophils Absolute: 0 10*3/uL (ref 0.0–0.1)
Basophils Relative: 0 %
Eosinophils Absolute: 0 10*3/uL (ref 0.0–0.5)
Eosinophils Relative: 0 %
HCT: 39.8 % (ref 36.0–46.0)
Hemoglobin: 14.1 g/dL (ref 12.0–15.0)
Immature Granulocytes: 0 %
Lymphocytes Relative: 15 %
Lymphs Abs: 1.1 10*3/uL (ref 0.7–4.0)
MCH: 32 pg (ref 26.0–34.0)
MCHC: 35.4 g/dL (ref 30.0–36.0)
MCV: 90.5 fL (ref 80.0–100.0)
Monocytes Absolute: 0.4 10*3/uL (ref 0.1–1.0)
Monocytes Relative: 5 %
Neutro Abs: 5.9 10*3/uL (ref 1.7–7.7)
Neutrophils Relative %: 80 %
Platelets: 167 10*3/uL (ref 150–400)
RBC: 4.4 MIL/uL (ref 3.87–5.11)
RDW: 13 % (ref 11.5–15.5)
WBC: 7.5 10*3/uL (ref 4.0–10.5)
nRBC: 0 % (ref 0.0–0.2)

## 2023-03-16 LAB — POCT URINE DRUG SCREEN - MANUAL ENTRY (I-SCREEN)
POC Amphetamine UR: POSITIVE — AB
POC Buprenorphine (BUP): NOT DETECTED
POC Cocaine UR: NOT DETECTED
POC Marijuana UR: POSITIVE — AB
POC Methadone UR: NOT DETECTED
POC Methamphetamine UR: NOT DETECTED
POC Morphine: NOT DETECTED
POC Oxazepam (BZO): NOT DETECTED
POC Oxycodone UR: NOT DETECTED
POC Secobarbital (BAR): NOT DETECTED

## 2023-03-16 LAB — COMPREHENSIVE METABOLIC PANEL
ALT: 18 U/L (ref 0–44)
AST: 24 U/L (ref 15–41)
Albumin: 4.2 g/dL (ref 3.5–5.0)
Alkaline Phosphatase: 45 U/L (ref 38–126)
Anion gap: 12 (ref 5–15)
BUN: 11 mg/dL (ref 6–20)
CO2: 21 mmol/L — ABNORMAL LOW (ref 22–32)
Calcium: 9.2 mg/dL (ref 8.9–10.3)
Chloride: 103 mmol/L (ref 98–111)
Creatinine, Ser: 0.7 mg/dL (ref 0.44–1.00)
GFR, Estimated: >60 mL/min (ref >60)
Glucose, Bld: 96 mg/dL (ref 70–99)
Potassium: 3.6 mmol/L (ref 3.5–5.1)
Sodium: 136 mmol/L (ref 135–145)
Total Bilirubin: 0.6 mg/dL (ref 0.0–1.2)
Total Protein: 7.3 g/dL (ref 6.5–8.1)

## 2023-03-16 LAB — HEPATITIS PANEL, ACUTE
HCV Ab: NONREACTIVE
Hep A IgM: NONREACTIVE
Hep B C IgM: NONREACTIVE
Hepatitis B Surface Ag: NONREACTIVE

## 2023-03-16 LAB — POC URINE PREG, ED: Preg Test, Ur: NEGATIVE

## 2023-03-16 LAB — HEMOGLOBIN A1C
Hgb A1c MFr Bld: 4.8 % (ref 4.8–5.6)
Mean Plasma Glucose: 91.06 mg/dL

## 2023-03-16 LAB — LIPID PANEL
Cholesterol: 164 mg/dL (ref 0–200)
HDL: 56 mg/dL (ref >40)
LDL Cholesterol: 100 mg/dL — ABNORMAL HIGH (ref 0–99)
Total CHOL/HDL Ratio: 2.9 {ratio}
Triglycerides: 40 mg/dL (ref <150)
VLDL: 8 mg/dL (ref 0–40)

## 2023-03-16 LAB — POCT PREGNANCY, URINE: Preg Test, Ur: NEGATIVE

## 2023-03-16 LAB — ETHANOL: Alcohol, Ethyl (B): <10 mg/dL (ref <10)

## 2023-03-16 LAB — HIV ANTIBODY (ROUTINE TESTING W REFLEX): HIV Screen 4th Generation wRfx: NONREACTIVE

## 2023-03-16 LAB — TSH: TSH: 0.476 u[IU]/mL (ref 0.350–4.500)

## 2023-03-16 LAB — MAGNESIUM: Magnesium: 1.7 mg/dL (ref 1.7–2.4)

## 2023-03-16 LAB — VITAMIN D 25 HYDROXY (VIT D DEFICIENCY, FRACTURES): Vit D, 25-Hydroxy: 29.67 ng/mL — ABNORMAL LOW (ref 30–100)

## 2023-03-16 MED ORDER — LORAZEPAM 2 MG/ML IJ SOLN: 2.0000 mg | Freq: Three times a day (TID) | INTRAMUSCULAR | Status: DC | PRN

## 2023-03-16 MED ORDER — ACETAMINOPHEN 325 MG PO TABS: 650.0000 mg | ORAL_TABLET | Freq: Four times a day (QID) | ORAL | Status: DC | PRN

## 2023-03-16 MED ORDER — HYDROXYZINE HCL 25 MG PO TABS: 25.0000 mg | ORAL_TABLET | Freq: Three times a day (TID) | ORAL | Status: DC | PRN

## 2023-03-16 MED ORDER — DIPHENHYDRAMINE HCL 50 MG/ML IJ SOLN: 50.0000 mg | Freq: Three times a day (TID) | INTRAMUSCULAR | Status: DC | PRN

## 2023-03-16 MED ORDER — OLANZAPINE 5 MG PO TBDP: 5.0000 mg | ORAL_TABLET | Freq: Two times a day (BID) | ORAL | Status: DC

## 2023-03-16 MED ORDER — LORAZEPAM 1 MG PO TABS
1.0000 mg | ORAL_TABLET | Freq: Once | ORAL | Status: AC
  Administered 2023-03-16: 1 mg via ORAL
  Filled 2023-03-16: qty 1

## 2023-03-16 MED ORDER — MAGNESIUM HYDROXIDE 400 MG/5ML PO SUSP: 30.0000 mL | Freq: Every day | ORAL | Status: DC | PRN

## 2023-03-16 MED ORDER — PRAZOSIN HCL 1 MG PO CAPS
1.0000 mg | ORAL_CAPSULE | Freq: Every day | ORAL | Status: DC
  Administered 2023-03-16 – 2023-03-24 (×9): 1 mg via ORAL
  Filled 2023-03-16 (×11): qty 1

## 2023-03-16 MED ORDER — NICOTINE 14 MG/24HR TD PT24
14.0000 mg | MEDICATED_PATCH | Freq: Every day | TRANSDERMAL | Status: DC
Start: 2023-03-16 — End: 2023-03-25
  Administered 2023-03-16 – 2023-03-23 (×6): 14 mg via TRANSDERMAL
  Filled 2023-03-16 (×13): qty 1

## 2023-03-16 MED ORDER — HALOPERIDOL LACTATE 5 MG/ML IJ SOLN: 10.0000 mg | Freq: Three times a day (TID) | INTRAMUSCULAR | Status: DC | PRN

## 2023-03-16 MED ORDER — TRAZODONE HCL 50 MG PO TABS: 50.0000 mg | ORAL_TABLET | Freq: Every evening | ORAL | Status: DC | PRN

## 2023-03-16 MED ORDER — ALUM & MAG HYDROXIDE-SIMETH 200-200-20 MG/5ML PO SUSP: 30.0000 mL | ORAL | Status: DC | PRN

## 2023-03-16 MED ORDER — DIPHENHYDRAMINE HCL 25 MG PO CAPS
50.0000 mg | ORAL_CAPSULE | Freq: Three times a day (TID) | ORAL | Status: DC | PRN
  Administered 2023-03-22: 50 mg via ORAL
  Filled 2023-03-16: qty 2

## 2023-03-16 MED ORDER — HALOPERIDOL 5 MG PO TABS
5.0000 mg | ORAL_TABLET | Freq: Three times a day (TID) | ORAL | Status: DC | PRN
  Administered 2023-03-22: 5 mg via ORAL
  Filled 2023-03-16: qty 1

## 2023-03-16 MED ORDER — BUSPIRONE HCL 10 MG PO TABS
10.0000 mg | ORAL_TABLET | Freq: Three times a day (TID) | ORAL | Status: DC
  Administered 2023-03-16 – 2023-03-25 (×25): 10 mg via ORAL
  Filled 2023-03-16 (×3): qty 1
  Filled 2023-03-16: qty 2
  Filled 2023-03-16 (×10): qty 1
  Filled 2023-03-16: qty 2
  Filled 2023-03-16 (×3): qty 1
  Filled 2023-03-16 (×2): qty 2
  Filled 2023-03-16 (×4): qty 1
  Filled 2023-03-16: qty 2
  Filled 2023-03-16 (×9): qty 1

## 2023-03-16 MED ORDER — HALOPERIDOL LACTATE 5 MG/ML IJ SOLN: 5.0000 mg | Freq: Three times a day (TID) | INTRAMUSCULAR | Status: DC | PRN

## 2023-03-16 NOTE — ED Provider Notes (Signed)
Behavioral Health Urgent Care Medical Screening Exam  Patient Name: [name] MRN: [id number] Date of Evaluation: 03/16/23 Chief Complaint:  "It's been a lot going on." Diagnosis: Bipolar I disorder, most recent episode mixed Suicidal ideation Final diagnoses:  Bipolar I disorder, most recent episode mixed (HCC)  Suicidal ideation    History of Present illness: Assessed this patient face to face, [name] is a 23 y.o. female presenting to the [facility] today escorted by law enforcement for evaluation. [name] was staying at a friends house and called 911 due to feeling suicidal and having homicidal thoughts. [name] is alert and oriented x4, cooperative during the assessment, seated in the assessment room, presenting anxious and frustrated. [name] reports she met a new friend named, "[name]," a few weeks ago and she has been staying on and off at [employer]. She reports today they were involved in a verbal altercation, which led to [name] calling the police to come get her, due to suicidal and homicidal thoughts. She reports she has not slept in 2-3 days. She endorses average appetite. Denies current substance abuse or current alcohol use.   [name] reports she has not been taking her medications. [name] reports she has been clean from using substances since November 2024, but her mental health has been struggling. She reports her primary stressors are: living with her grandparents but they are not supportive, her son being in the care of his dad, and coping with all the emotions she is feeling related to her past life choices.   Rainah endorses suicidal thoughts and states, "Sometimes I see myself doing the most." She does not endorse a specific plan or any intent to act on her suicidal thoughts. She reports homicidal thoughts as well, but she did not disclose whom the homicidal thoughts were about and did not disclose any plan to act on her homicidal thoughts.   [name] has a history of multiple  inpatient admissions (per records, she predicts the number to be forty or more), and a history of multiple suicide attempts and a history of self-harm behaviors.  [name] has been diagnosed in the past with: MDD, borderline personality disorder, gereralized anxiety disorder, polysubstance abuse, PTSD, insomnia, ADHD, bipolar disorder, cannibis use disorder, panic disorder, stimulant use disorder, alcohol use disorder and insomnia.   [name] was offered support and encouragement, agrees with the plan for admission for inpatient hospitalization for stabilization and safety. Reviewed medications and discussed potential side effects, offered the opportunity to ask questions. No questions from [name] at this time.      Flowsheet Row ED from 03/16/2023 in [facility] Video Visit from 02/15/2023 in [facility] ED from 01/11/2023 in [facility] Emergency Department at [facility]  C-SSRS RISK CATEGORY Low Risk Error: Q7 should not be populated when Q6 is No Error: Q7 should not be populated when Q6 is No       Psychiatric Specialty Exam  Presentation  General Appearance:Appropriate for Environment  Eye Contact:Good  Speech:Clear and Coherent; Normal Rate  Speech Volume:Increased  Handedness:Right   Mood and Affect  Mood: Depressed; Anxious; Worthless  Affect: Appropriate; Congruent; Tearful   Thought Process  Thought Processes: Coherent  Descriptions of Associations:Circumstantial  Orientation:Full (Time, Place and Person)  Thought Content:Logical  Diagnosis of Schizophrenia or Schizoaffective disorder in past: No   Hallucinations:None Denied AH  Ideas of Reference:None  Suicidal Thoughts:Yes, Active Without Intent; Without Means to Carry Out (Patient stated "I see myself doing the most."  when asked about plan for suicide) Without Intent  Homicidal Thoughts:Yes, Passive (No particular person or plan  disclosed.) Without Intent Without Intent; Without Plan; Without Means to Carry Out   Sensorium  Memory: Immediate Good  Judgment: Impaired  Insight: Lacking   Executive Functions  Concentration: Fair  Attention Span: Fair  Recall: [employer] of Knowledge: Fair  Language: Fair   Psychomotor Activity  Psychomotor Activity: Normal   Assets  Assets: Desire for Improvement; Communication Skills; Resilience   Sleep  Sleep: Poor  Number of hours:  0   Physical Exam: Physical Exam Vitals reviewed.  Constitutional:      Appearance: Normal appearance.  HENT:     Head: Normocephalic.     Nose: Nose normal.     Mouth/Throat:     Mouth: Mucous membranes are moist.  Cardiovascular:     Rate and Rhythm: Normal rate.     Pulses: Normal pulses.  Pulmonary:     Effort: Pulmonary effort is normal.  Musculoskeletal:        General: Normal range of motion.     Cervical back: Normal range of motion.  Skin:    General: Skin is warm.  Neurological:     General: No focal deficit present.     Mental Status: She is alert and oriented to person, place, and time. Mental status is at baseline.  Psychiatric:        Attention and Perception: She is inattentive.        Mood and Affect: Mood is anxious and depressed.        Speech: Speech is rapid and pressured.        Behavior: Behavior is hyperactive.        Thought Content: Thought content includes homicidal and suicidal ideation.        Cognition and Memory: Cognition and memory normal.        Judgment: Judgment is impulsive.    Review of Systems  Constitutional: Negative.   HENT: Negative.    Eyes: Negative.   Respiratory: Negative.    Cardiovascular: Negative.   Gastrointestinal: Negative.   Genitourinary: Negative.   Musculoskeletal: Negative.   Skin: Negative.   Psychiatric/Behavioral:  Positive for depression. The patient is nervous/anxious and has insomnia.    Blood pressure (!) 148/92, pulse  98, temperature 98.9 F (37.2 C), temperature source Oral, resp. rate 20, SpO2 100%. There is no height or weight on file to calculate BMI.  Musculoskeletal: Strength & Muscle Tone: within normal limits Gait & Station: normal Patient leans: N/A  Treatment Plan:  1. Bipolar I disorder, most recent episode mixed (HCC) (Primary) - OLANZapine zydis (ZYPREXA) disintegrating tablet 5 mg  2. Suicidal ideation -Recommending inpatient at [facility], she has a bed secured at [facility] pending labwork     [name] Discharge Disposition for Follow up and Recommendations: Based on my evaluation I certify that psychiatric inpatient services furnished can reasonably be expected to improve the patient's condition which I recommend transfer to an appropriate accepting facility.    Cherre Blanc, NP 03/16/2023, 2:24 PM

## 2023-03-16 NOTE — Progress Notes (Signed)
Pt has been accepted to [facility] on 03/16/2023 Bed assignment: 304-2  Pt meets inpatient criteria per: Joaquin Courts NP  Attending Physician will be: Dr. Sarita Bottom, MD   Report can be called to: Adult unit: [phone]  Pt can arrive after pending labs, vol, EKG, UDS.    Care Team Notified: [facility] Rona Ravens RN, Joaquin Courts NP, Cleotis Lema RN, Rozell Searing Nogo RN, Devinny Harrington NT   [place] Hibah Odonnell LCSW-A   03/16/2023 1:42 PM
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
## 2023-03-16 NOTE — ED Notes (Signed)
Patient is transferring to [facility] at this time via safe transport. Voluntary consent, EMTALA, and other transfer paperwork sent with patient and provided to transport. Patient A&OX4. Denies SI,HI, and A/V/H. Calm and cooperative. All valuables/belongings sent with patient. Patient in no current distress.
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
## 2023-03-16 NOTE — ED Notes (Signed)
Patient admitted to obs unit. Patient presented initially highly anxious but cooperative and redirectable with assessment. Patient endorses passive SI but denies HI and A/V/H with no plan or intent.  Patient tearful but was able to calm down and was given ativan po. Patient oriented to unit and provided with snacks. Patient currently resting with no s/s of current distress.
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
## 2023-03-16 NOTE — Discharge Instructions (Addendum)
Accepted to [facility]

## 2023-03-17 DIAGNOSIS — F3163 Bipolar disorder, current episode mixed, severe, without psychotic features: Secondary | ICD-10-CM

## 2023-03-17 LAB — GC/CHLAMYDIA PROBE AMP (~~LOC~~) NOT AT ARMC
Chlamydia: POSITIVE — AB
Comment: NEGATIVE
Comment: NORMAL
Neisseria Gonorrhea: NEGATIVE

## 2023-03-17 LAB — RPR: RPR Ser Ql: NONREACTIVE

## 2023-03-17 MED ORDER — ALBUTEROL SULFATE HFA 108 (90 BASE) MCG/ACT IN AERS: 1.0000 | INHALATION_SPRAY | Freq: Four times a day (QID) | RESPIRATORY_TRACT | Status: DC | PRN

## 2023-03-17 MED ORDER — MELATONIN 5 MG PO TABS
5.0000 mg | ORAL_TABLET | Freq: Every day | ORAL | Status: DC
  Administered 2023-03-17 – 2023-03-24 (×8): 5 mg via ORAL
  Filled 2023-03-17 (×9): qty 1

## 2023-03-17 MED ORDER — VITAMIN D3 25 MCG PO TABS
1000.0000 [IU] | ORAL_TABLET | Freq: Every day | ORAL | Status: DC
  Administered 2023-03-17 – 2023-03-25 (×9): 1000 [IU] via ORAL
  Filled 2023-03-17 (×12): qty 1

## 2023-03-17 MED ORDER — ARIPIPRAZOLE 5 MG PO TABS
5.0000 mg | ORAL_TABLET | Freq: Every day | ORAL | Status: DC
  Administered 2023-03-17 – 2023-03-18 (×2): 5 mg via ORAL
  Filled 2023-03-17 (×5): qty 1

## 2023-03-17 MED ORDER — LORATADINE 10 MG PO TABS
10.0000 mg | ORAL_TABLET | Freq: Every day | ORAL | Status: DC
  Administered 2023-03-17 – 2023-03-25 (×9): 10 mg via ORAL
  Filled 2023-03-17 (×12): qty 1

## 2023-03-17 MED ORDER — HYDROXYZINE HCL 50 MG PO TABS
50.0000 mg | ORAL_TABLET | Freq: Every day | ORAL | Status: DC
Start: 2023-03-17 — End: 2023-03-25
  Administered 2023-03-17 – 2023-03-24 (×8): 50 mg via ORAL
  Filled 2023-03-17 (×9): qty 1

## 2023-03-17 MED ORDER — ATOMOXETINE HCL 60 MG PO CAPS
60.0000 mg | ORAL_CAPSULE | Freq: Every day | ORAL | Status: DC
  Administered 2023-03-17 – 2023-03-25 (×9): 60 mg via ORAL
  Filled 2023-03-17 (×11): qty 1

## 2023-03-17 MED ORDER — DOXYCYCLINE HYCLATE 100 MG PO TABS
100.0000 mg | ORAL_TABLET | Freq: Two times a day (BID) | ORAL | Status: AC
  Administered 2023-03-17 – 2023-03-24 (×14): 100 mg via ORAL
  Filled 2023-03-17 (×15): qty 1

## 2023-03-17 MED ORDER — PRAZOSIN HCL 1 MG PO CAPS
1.0000 mg | ORAL_CAPSULE | Freq: Every day | ORAL | Status: DC
  Filled 2023-03-17: qty 1

## 2023-03-17 MED ORDER — TOPIRAMATE 25 MG PO TABS
25.0000 mg | ORAL_TABLET | Freq: Two times a day (BID) | ORAL | Status: DC
  Administered 2023-03-17 – 2023-03-25 (×16): 25 mg via ORAL
  Filled 2023-03-17 (×20): qty 1

## 2023-03-17 NOTE — Group Note (Signed)
LCSW Group Therapy Note   Group Date: 03/17/2023 Start Time: 1100 End Time: 1200  Participation:  did not attend  Type of Therapy:  Group Therapy   Topic:  Finding Balance: Using Wise Mind for Thoughtful Decisions  Objective: This class aims to help participants understand the concept of [place] Mind and apply it in real-life situations for more balanced, thoughtful decision-making. Participants will gain tools to manage emotions, consider logic, and find a middle ground for healthier responses and outcomes.  Goals: 1.  Understand Wise Mind: Learn the difference between Emotional Mind, Reasonable Mind, and [place] Mind, and how [place] Mind helps balance emotions and logic for thoughtful decisions. 2.  Recognize Emotional and Reasonable Mind: Identify signs of Emotional Mind and Reasonable Mind in personal reactions and how to shift into Wise Mind for balanced responses. 3.  Practice Wise Mind: Engage in scenarios and activities to apply [employer], combining emotions and logic for balanced, thoughtful decisions.  Summary In this class, we explored Wise Mind as the balance between Emotional Mind (impulsive reactions) and Reasonable Mind (logic-focused responses). We discussed how to identify when we're in each state and practiced using Wise Mind to make more balanced, thoughtful decisions in everyday situations, improving decision-making and relationships.  Therapeutic Modalities This class incorporates Dialectical Behavior Therapy (DBT), mindfulness techniques, and emotion regulation strategies to help participants balance emotional and logical responses in their decision-making process.    Alla Feeling, LCSWA 03/17/2023  7:45 PM

## 2023-03-17 NOTE — Progress Notes (Signed)
Pt observed with fixed smile, logical and soft speech with fair eye contact. Denies SI, HI, AVH and pain when assessed. Reports increased racing thoughts "I keep worrying a lot, I can't pinpoint one thing. I also think it's from my trauma, stress, grieving from grandma's death anniversary coming up soon". Rates her anxiety and depression both 10/10 and body aches 8/10 "I don't want nothing for that now". Remains medication compliant without adverse drug reactions.  Pt attended scheduled groups in dayroom, off unit for meals and gym; returned without issues. Tolerated meals and fluids well.  Support, encouragement and reassurance offered. Safety checks maintained at Q 15 minutes intervals.

## 2023-03-17 NOTE — [facility] Suicide Risk Assessment (Signed)
[facility] Admission Suicide Risk Assessment   Nursing information obtained from:  Patient  Demographic factors:  Adolescent or young adult, Low socioeconomic status  Current Mental Status:  Suicidal ideation indicated by patient  Loss Factors:  Decline in physical health, Decrease in vocational status  Historical Factors:  Prior suicide attempts  Risk Reduction Factors:  Sense of responsibility to family  Total Time spent with patient: 1.5 hours  Principal Problem: Bipolar 1 disorder, mixed, severe (HCC)  Diagnosis:  Principal Problem:   Bipolar 1 disorder, mixed, severe (HCC)  Subjective Data: See H&P.  Continued Clinical Symptoms:  Alcohol Use Disorder Identification Test Final Score (AUDIT): 2 The "Alcohol Use Disorders Identification Test", Guidelines for Use in Primary Care, Second Edition.  World Science writer [facility]). Score between 0-7:  no or low risk or alcohol related problems. Score between 8-15:  moderate risk of alcohol related problems. Score between 16-19:  high risk of alcohol related problems. Score 20 or above:  warrants further diagnostic evaluation for alcohol dependence and treatment.  CLINICAL FACTORS:   Severe Anxiety and/or Agitation Panic Attacks Bipolar Disorder:   Depressive phase Alcohol/Substance Abuse/Dependencies Personality Disorders:   Cluster B Chronic Pain More than one psychiatric diagnosis Previous Psychiatric Diagnoses and Treatments  Musculoskeletal: Strength & Muscle Tone: within normal limits Gait & Station: normal Patient leans: N/A  Psychiatric Specialty Exam:  Presentation  General Appearance:  Casual  Eye Contact: Fair  Speech: Clear and Coherent; Normal Rate  Speech Volume: Normal  Handedness: Right   Mood and Affect  Mood: Depressed  Affect: Flat   Thought Process  Thought Processes: Coherent  Descriptions of Associations:Intact  Orientation:Full (Time, Place and Person)  Thought  Content:Logical  History of Schizophrenia/Schizoaffective disorder:No  Duration of Psychotic Symptoms:N/A  Hallucinations:Hallucinations: None  Ideas of Reference:None  Suicidal Thoughts:Suicidal Thoughts: Yes, Passive SI Active Intent and/or Plan: Without Intent; Without Means to Carry Out (Patient stated "I see myself doing the most." when asked about plan for suicide) SI Passive Intent and/or Plan: Without Intent; Without Plan; Without Means to Carry Out  Homicidal Thoughts:Homicidal Thoughts: No HI Passive Intent and/or Plan: Without Intent; Without Plan; Without Means to Carry Out   Sensorium  Memory: Immediate Good; Recent Good; Remote Good  Judgment: Poor  Insight: Poor   Executive Functions  Concentration: Fair  Attention Span: Fair  Recall: Good  Fund of Knowledge: Fair  Language: Good   Psychomotor Activity  Psychomotor Activity: Psychomotor Activity: Normal   Assets  Assets: Physical Health; Social Support   Sleep  Sleep: Sleep: Fair Number of Hours of Sleep: 5.5  Physical Exam: Physical Exam Constitutional:      Appearance: Normal appearance.  HENT:     Head: Normocephalic and atraumatic.  Eyes:     Extraocular Movements: Extraocular movements intact.  Cardiovascular:     Comments: Elevated blood pressure: 141/85 Pulmonary:     Effort: Pulmonary effort is normal.  Musculoskeletal:        General: Normal range of motion.     Cervical back: Normal range of motion.  Skin:    General: Skin is warm and dry.  Neurological:     General: No focal deficit present.     Mental Status: She is alert and oriented to person, place, and time.  Psychiatric:        Behavior: Behavior normal.    Review of Systems  Constitutional:  Negative for fever.  HENT:  Negative for hearing loss.   Respiratory:  Negative for  cough and shortness of breath.   Cardiovascular:  Negative for chest pain.  Gastrointestinal:  Negative for constipation,  diarrhea, nausea and vomiting.  Genitourinary:  Negative for dysuria.  Musculoskeletal:  Positive for back pain, myalgias and neck pain.  Skin:  Negative for rash.  Neurological:  Positive for headaches (Hx migran HA). Negative for tingling, tremors, sensory change, speech change, focal weakness, seizures, loss of consciousness and weakness.  Psychiatric/Behavioral:  Positive for depression and substance abuse. Negative for hallucinations, memory loss and suicidal ideas. The patient is nervous/anxious and has insomnia.    Blood pressure (!) 141/85, pulse 94, temperature 98.1 F (36.7 C), temperature source Oral, resp. rate 16, height 5\' 9"  (1.753 m), weight 89.6 kg, SpO2 100%. Body mass index is 29.18 kg/m.   COGNITIVE FEATURES THAT CONTRIBUTE TO RISK:  Polarized thinking    SUICIDE RISK:   Moderate:  Frequent suicidal ideation with limited intensity, and duration, some specificity in terms of plans, no associated intent, good self-control, limited dysphoria/symptomatology, some risk factors present, and identifiable protective factors, including available and accessible social support.  PLAN OF CARE:   I certify that inpatient services furnished can reasonably be expected to improve the patient's condition.   Armandina Stammer, NP, pmhnp, fnp-bc 03/17/2023, 2:52 PM

## 2023-03-17 NOTE — Progress Notes (Signed)
   03/17/23 0750  Psych Admission Type (Psych Patients Only)  Admission Status Voluntary  Psychosocial Assessment  Patient Complaints Anxiety;Depression  Eye Contact Fair  Facial Expression Flat;Sad  Affect Anxious  Speech Logical/coherent  Interaction Assertive  Motor Activity Fidgety  Appearance/Hygiene In scrubs  Behavior Characteristics Cooperative;Appropriate to situation  Mood Pleasant  Thought Process  Coherency WDL  Content WDL  Delusions None reported or observed  Perception WDL  Hallucination None reported or observed  Judgment Poor  Confusion None  Danger to Self  Current suicidal ideation? Passive  Agreement Not to Harm Self Yes  Description of Agreement verbal  Danger to Others  Danger to Others None reported or observed

## 2023-03-17 NOTE — [facility] Group Notes (Signed)
[facility] Group Notes:  (Nursing/MHT/Case Management/Adjunct)  Date:  03/17/2023  Time:  2000  Type of Therapy:   wrap up group  Participation Level:  Active  Participation Quality:  Appropriate and Attentive  Affect:  Appropriate  Cognitive:  Alert and Appropriate  Insight:  Appropriate and Good  Engagement in Group:  Engaged and Supportive  Modes of Intervention:  Discussion, Socialization, and Support  Summary of Progress/Problems:  Fay Records 03/17/2023, 9:38 PM

## 2023-03-17 NOTE — Plan of Care (Signed)
   Problem: Education: Goal: Knowledge of [place] Education information/materials will improve Outcome: Progressing Goal: Emotional status will improve Outcome: Progressing
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
## 2023-03-17 NOTE — Plan of Care (Signed)
  Problem: Education: Goal: Knowledge of [place] [facility] [place] [facility] Education information/materials will improve 03/17/2023 0832 by Thersa Salt, RN Outcome: Progressing 03/17/2023 0831 by Thersa Salt, RN Outcome: Progressing Goal: Emotional status will improve 03/17/2023 0832 by Thersa Salt, RN Outcome: Progressing 03/17/2023 0831 by Thersa Salt, RN Outcome: Progressing Goal: Mental status will improve 03/17/2023 0832 by Thersa Salt, RN Outcome: Progressing 03/17/2023 0831 by Thersa Salt, RN Outcome: Progressing Goal: Verbalization of understanding the information provided will improve 03/17/2023 0832 by Thersa Salt, RN Outcome: Progressing 03/17/2023 0831 by Thersa Salt, RN Outcome: Progressing   Problem: Activity: Goal: Interest or engagement in activities will improve Outcome: Progressing Goal: Sleeping patterns will improve Outcome: Progressing

## 2023-03-17 NOTE — H&P (Signed)
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 Plan Summary: Daily contact with patient to assess and evaluate symptoms and progress in treatment and Medication management.   Principal/active diagnoses.  Bipolar 1 disorder, mixed, severe (HCC).  PTSD.  Panic disorder.  Associated symptoms.  Anger issues. Frequent outbursts.  Suicidal thoughts.  Reckless behaviors (Sex work).  Insomnia.  Plan: The risks/benefits/side-effects/alternatives to the medications in use were discussed in detail with the patient and time was  given for patient's questions. The patient consents to medication trial.   Resumed medications per patient's requests.  -Abilify 5 mg po daily for mood control.  -Strattera 60 mg po daily for poor focus.  -Buspar 10 mg po tid for anxiety.  -Hydroxyzine 50 mg po Q hs for insomnia.  -Initiated Melatonin 5 mg po Q hs for insomnia.  -Minipress 1 mg po Q hs for nightmares.  -Topamax 25 mg po bid for mood/migraine headaches.  -Continue nicotine patch 14 mg trans-dermally Q 24 hrs for nicotine withdrawal.  Other medical issues: (resumed). -Albuterol 1-2 puffs Q 6 hrs prn for SOB.  -Vitamin D3 1,000 units po daily for vitamin D replacement.  Other PRNS -Continue Tylenol 650 mg every 6 hours PRN for mild pain -Continue Maalox 30 ml Q 4 hrs PRN for indigestion -Continue MOM 30 ml po Q 6 hrs for constipation  Safety and Monitoring: Voluntary admission to inpatient psychiatric unit for safety, stabilization and treatment Daily contact with patient to assess and evaluate symptoms and progress in treatment Patient's case to be discussed in multi-disciplinary team meeting Observation Level : q15 minute checks Vital signs: q12 hours Precautions: Safety  Discharge Planning: Social work and case management to assist with discharge planning and identification of hospital follow-up needs prior to discharge Estimated LOS: 5-7 days Discharge Concerns: Need to establish a safety plan; Medication compliance and effectiveness Discharge Goals: Return home with outpatient referrals for mental health follow-up including medication management/psychotherapy  Observation Level/Precautions:  15 minute checks  Laboratory:    Psychotherapy: Enrolled in the group, milieu  Medications:  Stop lexapro, patient states that she gets more depressed with SSRI Continue abilify Added topamax 25mg  PO BID for chronic pain, headaches and impulsivity Started strattera 40mg  PO daily at patient request for ADHD   Consultations:  SW  Discharge Concerns:  housing  Estimated LOS: 5-7 days  Other:     Physician Treatment Plan for Primary Diagnosis: Bipolar 1 disorder, mixed, severe (HCC) Long Term Goal(s): Improvement in symptoms so as ready for discharge  Short Term Goals: Ability to identify changes in lifestyle to reduce recurrence of condition will improve, Ability to verbalize feelings will improve, Ability to disclose and discuss suicidal ideas, and Ability to demonstrate self-control will improve  Physician Treatment Plan for Secondary Diagnosis: Principal Problem:   Bipolar 1 disorder, mixed, severe (HCC)  Long Term Goal(s): Improvement in symptoms so as ready for discharge  Short Term Goals: Ability to identify and develop effective coping behaviors will improve, Ability to maintain clinical measurements within normal limits will improve, Compliance with prescribed medications will improve, and Ability to identify triggers associated with substance abuse/mental health issues will improve  I certify that inpatient services furnished can reasonably be expected to improve the patient's condition.    Armandina Stammer, NP, pmhnp, fnp-bc. 2/20/202511:58 AM

## 2023-03-18 ENCOUNTER — Encounter (HOSPITAL_COMMUNITY): Payer: Self-pay

## 2023-03-18 DIAGNOSIS — F3163 Bipolar disorder, current episode mixed, severe, without psychotic features: Secondary | ICD-10-CM | POA: Diagnosis not present

## 2023-03-18 MED ORDER — ARIPIPRAZOLE 15 MG PO TABS
7.5000 mg | ORAL_TABLET | Freq: Every day | ORAL | Status: DC
  Administered 2023-03-19 – 2023-03-25 (×7): 7.5 mg via ORAL
  Filled 2023-03-18 (×8): qty 1

## 2023-03-18 MED ORDER — GABAPENTIN 100 MG PO CAPS
100.0000 mg | ORAL_CAPSULE | Freq: Three times a day (TID) | ORAL | Status: DC
  Administered 2023-03-18 – 2023-03-19 (×2): 100 mg via ORAL
  Filled 2023-03-18 (×6): qty 1

## 2023-03-18 NOTE — Progress Notes (Signed)
   03/17/23 2000  Psych Admission Type (Psych Patients Only)  Admission Status Voluntary  Psychosocial Assessment  Patient Complaints Anxiety;Depression  Eye Contact Fair  Facial Expression Flat;Sad  Affect Anxious;Depressed;Flat  Speech Logical/coherent  Interaction Assertive  Motor Activity Slow;Lethargic  Appearance/Hygiene In scrubs;In hospital gown  Behavior Characteristics Cooperative;Appropriate to situation  Mood Pleasant  Thought Process  Coherency WDL  Content WDL  Delusions None reported or observed  Perception WDL  Hallucination None reported or observed  Judgment Poor  Confusion None  Danger to Self  Current suicidal ideation? Passive  Agreement Not to Harm Self Yes  Description of Agreement verbal  Danger to Others  Danger to Others None reported or observed

## 2023-03-18 NOTE — Progress Notes (Signed)
   03/18/23 0931  Psych Admission Type (Psych Patients Only)  Admission Status Voluntary  Psychosocial Assessment  Patient Complaints Depression  Eye Contact Fair  Facial Expression Flat  Affect Flat  Speech Logical/coherent  Interaction Assertive  Motor Activity Slow  Appearance/Hygiene Unremarkable  Behavior Characteristics Appropriate to situation  Mood Pleasant  Thought Process  Coherency WDL  Content WDL  Delusions None reported or observed  Perception WDL  Hallucination None reported or observed  Judgment Poor  Confusion None  Danger to Self  Current suicidal ideation? Denies  Agreement Not to Harm Self Yes  Description of Agreement verbak  Danger to Others  Danger to Others None reported or observed

## 2023-03-18 NOTE — BH IP Treatment Plan (Signed)
Interdisciplinary Treatment and Diagnostic Plan Update  03/18/2023 Time of Session: 11:05 AM [name] MRN: [id number]  Principal Diagnosis: Bipolar 1 disorder, mixed, severe (HCC)  Secondary Diagnoses: Principal Problem:   Bipolar 1 disorder, mixed, severe (HCC)   Current Medications:  Current Facility-Administered Medications  Medication Dose Route Frequency Provider Last Rate Last Admin   acetaminophen (TYLENOL) tablet 650 mg  650 mg Oral Q6H PRN Bing Neighbors, NP       albuterol (VENTOLIN HFA) 108 (90 Base) MCG/ACT inhaler 1-2 puff  1-2 puff Inhalation Q6H PRN Armandina Stammer I, NP       alum & mag hydroxide-simeth (MAALOX/MYLANTA) 200-200-20 MG/5ML suspension 30 mL  30 mL Oral Q4H PRN Bing Neighbors, NP       Melene Muller ON 03/19/2023] ARIPiprazole (ABILIFY) tablet 7.5 mg  7.5 mg Oral Daily Nwoko, Nicole Kindred I, NP       atomoxetine (STRATTERA) capsule 60 mg  60 mg Oral Daily Armandina Stammer I, NP   60 mg at 03/18/23 0804   busPIRone (BUSPAR) tablet 10 mg  10 mg Oral TID Bing Neighbors, NP   10 mg at 03/18/23 1617   haloperidol (HALDOL) tablet 5 mg  5 mg Oral TID PRN Bing Neighbors, NP       And   diphenhydrAMINE (BENADRYL) capsule 50 mg  50 mg Oral TID PRN Bing Neighbors, NP       haloperidol lactate (HALDOL) injection 10 mg  10 mg Intramuscular TID PRN Bing Neighbors, NP       And   diphenhydrAMINE (BENADRYL) injection 50 mg  50 mg Intramuscular TID PRN Bing Neighbors, NP       And   LORazepam (ATIVAN) injection 2 mg  2 mg Intramuscular TID PRN Bing Neighbors, NP       haloperidol lactate (HALDOL) injection 5 mg  5 mg Intramuscular TID PRN Bing Neighbors, NP       And   diphenhydrAMINE (BENADRYL) injection 50 mg  50 mg Intramuscular TID PRN Bing Neighbors, NP       And   LORazepam (ATIVAN) injection 2 mg  2 mg Intramuscular TID PRN Bing Neighbors, NP       doxycycline (VIBRA-TABS) tablet 100 mg  100 mg Oral Q12H Lauro Franklin, MD   100 mg  at 03/18/23 4098   gabapentin (NEURONTIN) capsule 100 mg  100 mg Oral TID Armandina Stammer I, NP   100 mg at 03/18/23 1617   hydrOXYzine (ATARAX) tablet 50 mg  50 mg Oral QHS Armandina Stammer I, NP   50 mg at 03/17/23 2102   loratadine (CLARITIN) tablet 10 mg  10 mg Oral Daily Armandina Stammer I, NP   10 mg at 03/18/23 0804   magnesium hydroxide (MILK OF MAGNESIA) suspension 30 mL  30 mL Oral Daily PRN Bing Neighbors, NP       melatonin tablet 5 mg  5 mg Oral QHS Nwoko, Agnes I, NP   5 mg at 03/17/23 2055   nicotine (NICODERM CQ - dosed in mg/24 hours) patch 14 mg  14 mg Transdermal Daily Starleen Blue, NP   14 mg at 03/17/23 1106   prazosin (MINIPRESS) capsule 1 mg  1 mg Oral QHS Bing Neighbors, NP   1 mg at 03/17/23 2055   topiramate (TOPAMAX) tablet 25 mg  25 mg Oral BID Armandina Stammer I, NP   25 mg at 03/18/23 1617   vitamin  D3 (CHOLECALCIFEROL) tablet 1,000 Units  1,000 Units Oral Daily Armandina Stammer I, NP   1,000 Units at 03/18/23 0804   PTA Medications: Medications Prior to Admission  Medication Sig Dispense Refill Last Dose/Taking   albuterol (VENTOLIN HFA) 108 (90 Base) MCG/ACT inhaler Inhale 1-2 puffs into the lungs every 6 (six) hours as needed for wheezing or shortness of breath.      ARIPiprazole (ABILIFY) 5 MG tablet Take 1 tablet (5 mg total) by mouth daily. (Patient not taking: Reported on 03/16/2023) 30 tablet 3    atomoxetine (STRATTERA) 60 MG capsule Take 1 capsule (60 mg total) by mouth daily. (Patient not taking: Reported on 03/16/2023) 30 capsule 3    busPIRone (BUSPAR) 10 MG tablet Take 1 tablet (10 mg total) by mouth 3 (three) times daily. (Patient not taking: Reported on 03/16/2023) 90 tablet 3    Cholecalciferol (VITAMIN D3 PO) Take 1 tablet by mouth daily.      lidocaine (LIDODERM) 5 % Place 2 patches onto the skin daily. Remove & Discard patch within 12 hours or as directed by MD 60 patch 0    loratadine (CLARITIN) 10 MG tablet Take 1 tablet (10 mg total) by mouth daily. 30  tablet 0    Multiple Vitamin (MULTIVITAMIN WITH MINERALS) TABS tablet Take 1 tablet by mouth daily.      prazosin (MINIPRESS) 1 MG capsule Take 1 capsule (1 mg total) by mouth at bedtime. (Patient taking differently: Take 1 mg by mouth at bedtime as needed (For nightmares).) 30 capsule 3    topiramate (TOPAMAX) 25 MG tablet Take 1 tablet (25 mg total) by mouth 2 (two) times daily. (Patient not taking: Reported on 03/16/2023) 60 tablet 3    traZODone (DESYREL) 50 MG tablet Take 1 tablet (50 mg total) by mouth at bedtime as needed for sleep. 30 tablet 3     Patient Stressors: Financial difficulties   Marital or family conflict   Occupational concerns   Traumatic event    Patient Strengths: Capable of independent living  Forensic psychologist fund of knowledge  Motivation for treatment/growth   Treatment Modalities: Medication Management, Group therapy, Case management,  1 to 1 session with clinician, Psychoeducation, Recreational therapy.   Physician Treatment Plan for Primary Diagnosis: Bipolar 1 disorder, mixed, severe (HCC) Long Term Goal(s): Improvement in symptoms so as ready for discharge   Short Term Goals: Ability to identify and develop effective coping behaviors will improve Ability to maintain clinical measurements within normal limits will improve Compliance with prescribed medications will improve Ability to identify triggers associated with substance abuse/mental health issues will improve Ability to identify changes in lifestyle to reduce recurrence of condition will improve Ability to verbalize feelings will improve Ability to disclose and discuss suicidal ideas Ability to demonstrate self-control will improve  Medication Management: Evaluate patient's response, side effects, and tolerance of medication regimen.  Therapeutic Interventions: 1 to 1 sessions, Unit Group sessions and Medication administration.  Evaluation of Outcomes: Not Progressing  Physician  Treatment Plan for Secondary Diagnosis: Principal Problem:   Bipolar 1 disorder, mixed, severe (HCC)  Long Term Goal(s): Improvement in symptoms so as ready for discharge   Short Term Goals: Ability to identify and develop effective coping behaviors will improve Ability to maintain clinical measurements within normal limits will improve Compliance with prescribed medications will improve Ability to identify triggers associated with substance abuse/mental health issues will improve Ability to identify changes in lifestyle to reduce recurrence of condition will improve  Ability to verbalize feelings will improve Ability to disclose and discuss suicidal ideas Ability to demonstrate self-control will improve     Medication Management: Evaluate patient's response, side effects, and tolerance of medication regimen.  Therapeutic Interventions: 1 to 1 sessions, Unit Group sessions and Medication administration.  Evaluation of Outcomes: Not Progressing   RN Treatment Plan for Primary Diagnosis: Bipolar 1 disorder, mixed, severe (HCC) Long Term Goal(s): Knowledge of disease and therapeutic regimen to maintain health will improve  Short Term Goals: Ability to remain free from injury will improve, Ability to verbalize frustration and anger appropriately will improve, Ability to demonstrate self-control, Ability to participate in decision making will improve, Ability to verbalize feelings will improve, Ability to disclose and discuss suicidal ideas, Ability to identify and develop effective coping behaviors will improve, and Compliance with prescribed medications will improve  Medication Management: RN will administer medications as ordered by provider, will assess and evaluate patient's response and provide education to patient for prescribed medication. RN will report any adverse and/or side effects to prescribing provider.  Therapeutic Interventions: 1 on 1 counseling sessions, Psychoeducation,  Medication administration, Evaluate responses to treatment, Monitor vital signs and CBGs as ordered, Perform/monitor CIWA, COWS, AIMS and Fall Risk screenings as ordered, Perform wound care treatments as ordered.  Evaluation of Outcomes: Not Progressing   LCSW Treatment Plan for Primary Diagnosis: Bipolar 1 disorder, mixed, severe (HCC) Long Term Goal(s): Safe transition to appropriate next level of care at discharge, Engage patient in therapeutic group addressing interpersonal concerns.  Short Term Goals: Engage patient in aftercare planning with referrals and resources, Increase social support, Increase ability to appropriately verbalize feelings, Increase emotional regulation, Facilitate acceptance of mental health diagnosis and concerns, Facilitate patient progression through stages of change regarding substance use diagnoses and concerns, Identify triggers associated with mental health/substance abuse issues, and Increase skills for wellness and recovery  Therapeutic Interventions: Assess for all discharge needs, 1 to 1 time with Social worker, Explore available resources and support systems, Assess for adequacy in community support network, Educate family and significant other(s) on suicide prevention, Complete Psychosocial Assessment, Interpersonal group therapy.  Evaluation of Outcomes: Not Progressing   Progress in Treatment: Attending groups: No. Participating in groups: No. Taking medication as prescribed: Yes. Toleration medication: Yes. Family/Significant other contact made: No, will contact:  Silvano Bilis (best friend), patient couldn't remember her phone number at the time of the PSA Patient understands diagnosis:  Discussing patient identified problems/goals with staff: Yes Medical problems stabilized or resolved: Yes. Denies suicidal/homicidal ideation: Yes. Issues/concerns per patient self-inventory: No.  New problem(s) identified: No  New Short Term/Long Term  Goal(s):    medication stabilization, elimination of SI thoughts, development of comprehensive mental wellness plan.   Patient Goals:  "I want to become emotionally regulated.  My mind has been feeling foggy for a while." Patient admitted to using weed recently. She stated that she hasn't used hard drugs, such as crack or heroin, since December 20, 2022. She said that she would like to go to a long-term treatment program.  Discharge Plan or Barriers:  Patient recently admitted. CSW will continue to follow and assess for appropriate referrals and possible discharge planning.   Reason for Continuation of Hospitalization: Anxiety Medication stabilization Suicidal ideation  Estimated Length of Stay:  5 - 7 days  Last 3 [place] Suicide Severity Risk Score: Flowsheet Row [facility] (Current[facility] Most recent reading at 03/16/2023  6:00 PM ED from  03/16/2023 in [facility] Most recent reading at 03/16/2023  3:37 PM Video Visit from 02/15/2023 in [facility] Most recent reading at 02/15/2023  2:46 PM  C-SSRS RISK CATEGORY Low Risk Low Risk Error: Q7 should not be populated when Q6 is No       Last PHQ 2/9 Scores:    02/15/2023    2:46 PM 12/22/2022   10:26 AM 12/06/2022   10:57 AM  Depression screen PHQ 2/9  Decreased Interest 2 3 1   Down, Depressed, Hopeless 2 3 2   PHQ - 2 Score 4 6 3   Altered sleeping 3 3 1   Tired, decreased energy 3 3 3   Change in appetite 3 3 1   Feeling bad or failure about yourself  2 3 3   Trouble concentrating 3 3 3   Moving slowly or fidgety/restless 3 3 3   Suicidal thoughts 1 1 1   PHQ-9 Score 22 25 18   Difficult doing work/chores Very difficult  Very difficult    Scribe for Treatment Team: Nyra Jabs 03/18/2023 6:02 PM
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
## 2023-03-18 NOTE — Group Note (Addendum)
Date:  03/18/2023 Time:  9:33 PM  Group Topic/Focus:  Alcoholics Anonymous (AA) Meeting    Participation Level:  Did Not Attend  Participation Quality:  n/a  Affect:  n/a  Cognitive:  n/a  Insight: n/a  Engagement in Group:  n/a  Modes of Intervention:  n/a  Additional Comments:  Patient did not attend  Kennieth Francois 03/18/2023, 9:33 PM

## 2023-03-18 NOTE — Progress Notes (Signed)
   03/18/23 2311  Psych Admission Type (Psych Patients Only)  Admission Status Voluntary  Psychosocial Assessment  Patient Complaints Depression  Eye Contact Fair  Facial Expression Animated  Affect Appropriate to circumstance  Speech Logical/coherent  Interaction Assertive  Motor Activity Other (Comment) (WDL)  Appearance/Hygiene Unremarkable  Behavior Characteristics Appropriate to situation  Mood Pleasant  Thought Process  Coherency WDL  Content WDL  Delusions None reported or observed  Perception WDL  Hallucination None reported or observed  Judgment Poor  Confusion None  Danger to Self  Current suicidal ideation? Denies  Self-Injurious Behavior No self-injurious ideation or behavior indicators observed or expressed   Agreement Not to Harm Self Yes  Description of Agreement verbal  Danger to Others  Danger to Others None reported or observed

## 2023-03-18 NOTE — Group Note (Signed)
Recreation Therapy Group Note   Group Topic:Team Building  Group Date: 03/18/2023 Start Time: 0930 End Time: 0956 Facilitators: Kartel Wolbert-McCall, LRT,CTRS Location: [address] Dayroom   Group Topic: Communication, Team Building, Problem Solving  Goal Area(s) Addresses:  Patient will effectively work with peer towards shared goal.  Patient will identify skills used to make activity successful.  Patient will identify how skills used during activity can be used to reach post d/c goals.   Intervention: STEM Activity  Activity: Stage manager. In teams of 3-5, patients were given 12 plastic drinking straws and an equal length of masking tape. Using the materials provided, patients were asked to build a landing pad to catch a golf ball dropped from approximately 5 feet in the air. All materials were required to be used by the team in their design. LRT facilitated post-activity discussion.  Education: Pharmacist, community, Scientist, physiological, Discharge Planning   Education Outcome: Acknowledges education/In group clarification offered/Needs additional education.    Affect/Mood: N/A   Participation Level: Did not attend    Clinical Observations/Individualized Feedback:      Plan: Continue to engage patient in RT group sessions 2-3x/week.   [name], LRT,CTRS 03/18/2023 11:43 AM

## 2023-03-18 NOTE — Plan of Care (Signed)
  Problem: Coping: Goal: Ability to verbalize frustrations and anger appropriately will improve Outcome: Progressing Goal: Ability to demonstrate self-control will improve Outcome: Progressing   Problem: Health Behavior/Discharge Planning: Goal: Identification of resources available to assist in meeting health care needs will improve Outcome: Progressing Goal: Compliance with treatment plan for underlying cause of condition will improve Outcome: Progressing

## 2023-03-19 DIAGNOSIS — F3163 Bipolar disorder, current episode mixed, severe, without psychotic features: Secondary | ICD-10-CM | POA: Diagnosis not present

## 2023-03-19 MED ORDER — GABAPENTIN 100 MG PO CAPS
200.0000 mg | ORAL_CAPSULE | Freq: Three times a day (TID) | ORAL | Status: DC
  Administered 2023-03-19 – 2023-03-25 (×18): 200 mg via ORAL
  Filled 2023-03-19 (×23): qty 2

## 2023-03-19 NOTE — Progress Notes (Signed)
Pt did not attend goals group. 

## 2023-03-19 NOTE — Progress Notes (Signed)
 Pt did attend meditation group.

## 2023-03-19 NOTE — Progress Notes (Signed)
   03/19/23 0530  15 Minute Checks  Location Bedroom  Visual Appearance Calm  Behavior Sleeping  Sleep (Behavioral Health Patients Only)  Calculate sleep? (Click Yes once per 24 hr at 0600 safety check) Yes  Documented sleep last 24 hours 7.75
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
## 2023-03-19 NOTE — Group Note (Signed)
 LCSW Group Therapy Note   Group Date: 03/19/2023 Start Time: 1000 End Time: 1100   Type of Therapy and Topic:  Group Therapy:   Participation Level:  Did Not Attend   Corie Chiquito, LCSWA 03/19/2023  12:31 PM

## 2023-03-19 NOTE — Group Note (Signed)
 Date:  03/19/2023 Time:  2:39 PM  Group Topic/Focus:  Building Self Esteem:   Neomia Dear can help build self-esteem and confidence through singing, social engagement, and positive reinforcement.    Participation Level:  Did Not Attend   Shela Nevin 03/19/2023, 2:39 PM

## 2023-03-19 NOTE — [facility] Group Notes (Signed)
 [facility] Group Notes:  (Nursing/MHT/Case Management/Adjunct)  Date:  03/19/2023  Time:  9:29 PM  Type of Therapy:   Wrap up group  Participation Level:  Active  Participation Quality:  Appropriate  Affect:  Appropriate  Cognitive:  Appropriate  Insight:  Appropriate  Engagement in Group:  Engaged  Modes of Intervention:  Education  Summary of Progress/Problems: Pt goal to focus on herself. Rated day 8/10.   Noah Delaine 03/19/2023, 9:29 PM

## 2023-03-19 NOTE — Plan of Care (Signed)
   Problem: Education: Goal: Emotional status will improve Outcome: Progressing Goal: Mental status will improve Outcome: Progressing

## 2023-03-19 NOTE — Plan of Care (Signed)
  Problem: Coping: Goal: Ability to verbalize frustrations and anger appropriately will improve Outcome: Progressing Goal: Ability to demonstrate self-control will improve Outcome: Progressing   Problem: Health Behavior/Discharge Planning: Goal: Identification of resources available to assist in meeting health care needs will improve Outcome: Progressing Goal: Compliance with treatment plan for underlying cause of condition will improve Outcome: Progressing   Problem: Physical Regulation: Goal: Ability to maintain clinical measurements within normal limits will improve Outcome: Progressing   Problem: Safety: Goal: Periods of time without injury will increase Outcome: Progressing   

## 2023-03-19 NOTE — Progress Notes (Addendum)
 [name] presented friendly upon initial approach- smiling-voiced no complaints other than "feeling spacy", stating, "I'm okay, I just know I couldn't operate a motor vehicle feeling this way."[name] currently denies SI/HI and AVH and does not appear to be responding to internal stimuli. A. Labs and vitals monitored. [name] given and educated on medications. [name] supported emotionally and encouraged to express concerns and ask questions.   R. [name] remains safe with 15 minute checks. Will continue POC.    03/19/23 1000  Psych Admission Type (Psych Patients Only)  Admission Status Voluntary  Psychosocial Assessment  Patient Complaints None  Eye Contact Fair  Facial Expression Animated  Affect Appropriate to circumstance  Speech Logical/coherent  Interaction Assertive  Motor Activity Other (Comment) (steady gait)  Appearance/Hygiene Unremarkable  Behavior Characteristics Cooperative;Appropriate to situation  Mood Pleasant  Thought Process  Coherency WDL  Content WDL  Delusions None reported or observed  Perception WDL  Hallucination None reported or observed  Judgment Poor  Confusion None  Danger to Self  Current suicidal ideation? Denies  Self-Injurious Behavior No self-injurious ideation or behavior indicators observed or expressed   Agreement Not to Harm Self Yes  Description of Agreement agreed to contact staff before acting on harmful thoughts  Danger to Others  Danger to Others None reported or observed

## 2023-03-19 NOTE — Plan of Care (Signed)
   Problem: Education: Goal: Knowledge of [facility] Education information/materials will improve Outcome: Progressing   Problem: Education: Goal: Verbalization of understanding the information provided will improve Outcome: Progressing

## 2023-03-20 DIAGNOSIS — F3163 Bipolar disorder, current episode mixed, severe, without psychotic features: Secondary | ICD-10-CM | POA: Diagnosis not present

## 2023-03-20 NOTE — Plan of Care (Signed)
  Problem: Education: Goal: Emotional status will improve Outcome: Progressing Goal: Mental status will improve Outcome: Progressing   Problem: Coping: Goal: Ability to demonstrate self-control will improve Outcome: Progressing

## 2023-03-20 NOTE — Group Note (Unsigned)
 Date:  03/20/2023 Time:  2:37 PM  Group Topic/Focus:  Building Self Esteem:   The Focus of this group is helping patients become aware of the effects of self-esteem on their lives, the things they and others do that enhance or undermine their self-esteem, seeing the relationship between their level of self-esteem and the choices they make and learning ways to enhance self-esteem. Managing Feelings:   The focus of this group is to identify what feelings patients have difficulty handling and develop a plan to handle them in a healthier way upon discharge.     Participation Level:  {[facility] PARTICIPATION ZOXWR:60454}  Participation Quality:  {[facility] PARTICIPATION QUALITY:22265}  Affect:  {[facility] AFFECT:22266}  Cognitive:  {[facility] COGNITIVE:22267}  Insight: {B[facility] Insight2:20797}  Engagement in Group:  {[facility] ENGAGEMENT IN UJWJX:91478}  Modes of Intervention:  {[facility] MODES OF INTERVENTION:22269}  Additional Comments:  ***  Gardiner Barefoot 03/20/2023, 2:37 PM

## 2023-03-20 NOTE — [facility] Group Notes (Signed)
 [facility] Group Notes:  (Nursing/MHT/Case Management/Adjunct)  Date:  03/20/2023  Time:  8:52 AM  Type of Therapy:   Goals  Participation Level:  Active  Participation Quality:  Appropriate  Affect:  Appropriate  Cognitive:  Appropriate  Insight:  Appropriate  Engagement in Group:  Engaged  Modes of Intervention:  Orientation  Summary of Progress/Problems: Goal is to control her anxiety  Azalee Course 03/20/2023, 8:52 AM

## 2023-03-20 NOTE — [facility] Group Notes (Signed)
 Pt attended and participated in group activity.

## 2023-03-20 NOTE — Progress Notes (Signed)
   03/20/23 1200  Psych Admission Type (Psych Patients Only)  Admission Status Voluntary  Psychosocial Assessment  Patient Complaints Anxiety  Eye Contact Fair  Facial Expression Animated  Affect Appropriate to circumstance  Speech Logical/coherent  Interaction Assertive  Motor Activity Other (Comment) (steady gait)  Appearance/Hygiene Unremarkable  Behavior Characteristics Cooperative;Appropriate to situation  Mood Pleasant  Thought Process  Coherency WDL  Content WDL  Delusions None reported or observed  Perception WDL  Hallucination None reported or observed  Judgment WDL  Confusion None  Danger to Self  Current suicidal ideation? Passive  Self-Injurious Behavior No self-injurious ideation or behavior indicators observed or expressed   Agreement Not to Harm Self Yes  Description of Agreement agreed to contact staff before acting on harmful thoughts  Danger to Others  Danger to Others None reported or observed

## 2023-03-20 NOTE — Group Note (Signed)
 Date:  03/20/2023 Time:  2:34 PM  Group Topic/Focus:  Building Self Esteem:   The Focus of this group is helping patients become aware of the effects of self-esteem on their lives, the things they and others do that enhance or undermine their self-esteem, seeing the relationship between their level of self-esteem and the choices they make and learning ways to enhance self-esteem. Managing Feelings:   The focus of this group is to identify what feelings patients have difficulty handling and develop a plan to handle them in a healthier way upon discharge.    Participation Level:  Active  Participation Quality:  Appropriate and Attentive  Affect:  Excited  Cognitive:  Alert  Insight: Appropriate and Good  Engagement in Group:  Engaged  Modes of Intervention:  Socialization  Additional Comments:    Gardiner Barefoot 03/20/2023, 2:34 PM

## 2023-03-20 NOTE — Progress Notes (Addendum)
 Assumed care of patient last evening affect and mood pleasant. Pt c/o feeling "spacie" but feels it is getting better. Pt attended groups, acepted medications and without further complaints.  03/19/23 2000  Psych Admission Type (Psych Patients Only)  Admission Status Voluntary  Psychosocial Assessment  Patient Complaints Anxiety;Worrying  Eye Contact Fair  Facial Expression Animated  Affect Appropriate to circumstance  Speech Logical/coherent  Interaction Assertive;Dominating  Motor Activity  (WNL)  Appearance/Hygiene Unremarkable  Behavior Characteristics Cooperative;Appropriate to situation;Anxious  Mood Pleasant  Thought Process  Coherency WDL  Content WDL  Delusions None reported or observed  Perception WDL  Hallucination None reported or observed  Judgment WDL  Confusion None  Danger to Self  Current suicidal ideation? Denies  Self-Injurious Behavior No self-injurious ideation or behavior indicators observed or expressed   Agreement Not to Harm Self Yes  Description of Agreement verbal  Danger to Others  Danger to Others None reported or observed

## 2023-03-20 NOTE — Plan of Care (Signed)
  Problem: Education: Goal: Emotional status will improve Outcome: Progressing Goal: Mental status will improve Outcome: Progressing   Problem: Coping: Goal: Ability to verbalize frustrations and anger appropriately will improve Outcome: Progressing   

## 2023-03-20 NOTE — Progress Notes (Signed)
   03/20/23 2119  Psych Admission Type (Psych Patients Only)  Admission Status Voluntary  Psychosocial Assessment  Patient Complaints Anxiety;Sadness  Eye Contact Fair  Facial Expression Worried  Affect Anxious  Speech Logical/coherent  Interaction Assertive  Motor Activity Other (Comment) (WDL)  Appearance/Hygiene Unremarkable  Behavior Characteristics Cooperative  Mood Pleasant;Anxious;Sad  Thought Process  Coherency WDL  Content WDL  Delusions None reported or observed  Perception WDL  Hallucination None reported or observed  Judgment WDL  Confusion None  Danger to Self  Current suicidal ideation? Passive  Self-Injurious Behavior No self-injurious ideation or behavior indicators observed or expressed   Agreement Not to Harm Self Yes  Description of Agreement verbal  Danger to Others  Danger to Others None reported or observed
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
## 2023-03-20 NOTE — Group Note (Signed)
 Date:  03/20/2023 Time:  10:39 PM  Group Topic/Focus:  Wrap-Up Group:   The focus of this group is to help patients review their daily goal of treatment and discuss progress on daily workbooks.    Participation Level:  Active  Participation Quality:  Appropriate and Sharing  Affect:  Appropriate, Depressed, and Tearful  Cognitive:  Appropriate  Insight: Appropriate  Engagement in Group:  Engaged  Modes of Intervention:  Discussion and Socialization  Additional Comments:  Patients used wrap up group sheets during group and shared. Patient rated her day a 4/10. Patient goal for today "control my anxiety". Patient "somewhat" achieve goal. Coping skills the patient finds most helpful, "laying down, listening to water, talking to peers or staff". Something the patient likes about herself is, "my hair".   Kennieth Francois 03/20/2023, 10:39 PM

## 2023-03-21 DIAGNOSIS — F3163 Bipolar disorder, current episode mixed, severe, without psychotic features: Secondary | ICD-10-CM | POA: Diagnosis not present

## 2023-03-21 MED ORDER — SERTRALINE HCL 25 MG PO TABS
25.0000 mg | ORAL_TABLET | Freq: Every day | ORAL | Status: DC
  Administered 2023-03-21: 25 mg via ORAL
  Filled 2023-03-21 (×3): qty 1

## 2023-03-21 NOTE — [facility] Group Notes (Signed)
 Spiritual care group on grief and loss facilitated by Chaplain Dyanne Carrel, [facility]  Group Goal: Support / Education around grief and loss  Members engage in facilitated group support and psycho-social education.  Group Description:  Following introductions and group rules, group members engaged in facilitated group dialogue and support around topic of loss, with particular support around experiences of loss in their lives. Group Identified types of loss (relationships / self / things) and identified patterns, circumstances, and changes that precipitate losses. Reflected on thoughts / feelings around loss, normalized grief responses, and recognized variety in grief experience. Group encouraged individual reflection on safe space and on the coping skills that they are already utilizing.  Group drew on Adlerian / Rogerian and narrative framework  Patient Progress: [name] attended group and actively engaged and participated in group conversation and activities.  Comments demonstrated good insight and she was supportive of peers.
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
## 2023-03-21 NOTE — Plan of Care (Signed)
  Problem: Education: Goal: Knowledge of [place] [facility] Education information/materials will improve Outcome: Progressing Goal: Emotional status will improve Outcome: Progressing Goal: Mental status will improve Outcome: Progressing Goal: Verbalization of understanding the information provided will improve Outcome: Progressing  Patient is pleasant compliant with treatment denies SI/HI/A/VH and verbally contracts for safety. No medications adverse effects noted. Q 15 minutes safety checks ongoing. Patient remains safe.

## 2023-03-21 NOTE — Progress Notes (Signed)
   03/21/23 0525  15 Minute Checks  Location Bedroom  Visual Appearance Calm  Behavior Sleeping  Sleep (Behavioral Health Patients Only)  Calculate sleep? (Click Yes once per 24 hr at 0600 safety check) Yes  Documented sleep last 24 hours 7.25

## 2023-03-21 NOTE — Plan of Care (Signed)

## 2023-03-21 NOTE — BHH Group Notes (Signed)
Patient attended the AA group. 

## 2023-03-21 NOTE — Group Note (Signed)
 Recreation Therapy Group Note   Group Topic:Health and Wellness  Group Date: 03/21/2023 Start Time: 0931 End Time: 0950 Facilitators: Yakelin Grenier-McCall, LRT,CTRS Location: [address] Dayroom   Group Topic: Wellness  Goal Area(s) Addresses:  Patient will define components of whole wellness. Patient will verbalize benefit of whole wellness.  Intervention: Music  Activity: Exercise. LRT instructed patients they would take turns leading the group in the exercises of their choosing. Patients were encouraged to give their best but if they needed to take a break or get water to do so. Patients were also encouraged to give their best effort throughout group session.   Education: Wellness, Building control surveyor.   Education Outcome: Acknowledges education/In group clarification offered/Needs additional education.   Affect/Mood: Appropriate   Participation Level: Engaged   Participation Quality: Independent   Behavior: Appropriate   Speech/Thought Process: Focused   Insight: Good   Judgement: Good   Modes of Intervention: Music   Patient Response to Interventions:  Engaged   Education Outcome:  In group clarification offered    Clinical Observations/Individualized Feedback: Pt was bright and engaged during group session. Pt was social and interactive with peers. Pt also led group in a number of exercises as well.     Plan: Continue to engage patient in RT group sessions 2-3x/week.   Arionna Hoggard-McCall, LRT,CTRS 03/21/2023 12:16 PM

## 2023-03-21 NOTE — Progress Notes (Signed)
   03/21/23 0830  Psych Admission Type (Psych Patients Only)  Admission Status Voluntary  Psychosocial Assessment  Patient Complaints Anxiety  Eye Contact Fair  Facial Expression Worried  Affect Anxious  Speech Logical/coherent  Interaction Assertive  Motor Activity Other (Comment) (WDL)  Appearance/Hygiene Unremarkable  Behavior Characteristics Cooperative  Mood Pleasant;Anxious  Thought Process  Coherency WDL  Content WDL  Delusions None reported or observed  Perception WDL  Hallucination None reported or observed  Judgment WDL  Confusion None  Danger to Self  Current suicidal ideation? Denies  Agreement Not to Harm Self Yes  Description of Agreement verbal  Danger to Others  Danger to Others None reported or observed

## 2023-03-21 NOTE — Progress Notes (Signed)
  On assessment, patient is sad and tearful with moderate anxiety.  Patient reports having flashbacks of previous experiences.  Patient reports feeling broken inside.  Provided emotional support.  Patient reports she needs help getting started again and that she also needs therapy.  Patient endorses having fleeting thoughts of SI, but has agreed verbally to seek out staff for support before acting on her thoughts.  Patient is safe on the unit with q15 minute safety checks.

## 2023-03-22 DIAGNOSIS — F3163 Bipolar disorder, current episode mixed, severe, without psychotic features: Secondary | ICD-10-CM | POA: Diagnosis not present

## 2023-03-22 MED ORDER — SERTRALINE HCL 50 MG PO TABS
50.0000 mg | ORAL_TABLET | Freq: Every day | ORAL | Status: DC
  Administered 2023-03-22 – 2023-03-25 (×4): 50 mg via ORAL
  Filled 2023-03-22 (×6): qty 1
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
## 2023-03-22 NOTE — Plan of Care (Signed)
   Problem: Education: Goal: Knowledge of [facility] Education information/materials will improve Outcome: Progressing Goal: Emotional status will improve Outcome: Progressing Goal: Mental status will improve Outcome: Progressing Goal: Verbalization of understanding the information provided will improve Outcome: Progressing

## 2023-03-22 NOTE — [facility] Group Notes (Signed)
 Adult Psychoeducational Group Note  Date:  03/22/2023 Time:  4:09 PM  Group Topic/Focus:  Emotional Education:   The focus of this group is to discuss what feelings/emotions are, and how they are experienced. Goals Group:   The focus of this group is to help patients establish daily goals to achieve during treatment and discuss how the patient can incorporate goal setting into their daily lives to aide in recovery. Orientation:   The focus of this group is to educate the patient on the purpose and policies of crisis stabilization and provide a format to answer questions about their admission.  The group details unit policies and expectations of patients while admitted.  Participation Level:  Active  Participation Quality:  Attentive  Affect:  Appropriate  Cognitive:  Appropriate  Insight: Good  Engagement in Group:  Engaged  Modes of Intervention:  Discussion  Additional Comments:  Pt goal is to work on her mental health.  Lucilla Edin 03/22/2023, 4:09 PM

## 2023-03-22 NOTE — Plan of Care (Signed)
  Problem: Education: Goal: Knowledge of [facility] Education information/materials will improve 03/22/2023 0942 by Thersa Salt, RN Outcome: Progressing 03/22/2023 0909 by Thersa Salt, RN Outcome: Progressing Goal: Emotional status will improve 03/22/2023 0942 by Thersa Salt, RN Outcome: Progressing 03/22/2023 0909 by Thersa Salt, RN Outcome: Progressing Goal: Mental status will improve 03/22/2023 0942 by Thersa Salt, RN Outcome: Progressing 03/22/2023 0909 by Thersa Salt, RN Outcome: Progressing Goal: Verbalization of understanding the information provided will improve 03/22/2023 0942 by Thersa Salt, RN Outcome: Progressing 03/22/2023 0909 by Thersa Salt, RN Outcome: Progressing

## 2023-03-22 NOTE — Group Note (Signed)
 Recreation Therapy Group Note   Group Topic:Animal Assisted Therapy   Group Date: 03/22/2023 Start Time: 0945 End Time: 1030 Facilitators: Terran Hollenkamp-McCall, LRT,CTRS Location: [address]   Animal-Assisted Activity (AAA) Program Checklist/Progress Notes Patient Eligibility Criteria Checklist & Daily Group note for Rec Tx Intervention  AAA/T Program Assumption of Risk Form signed by Patient/ or Parent Legal Guardian Yes  Patient is free of allergies or severe asthma Yes  Patient reports no fear of animals Yes  Patient reports no history of cruelty to animals Yes  Patient understands his/her participation is voluntary Yes  Patient washes hands before animal contact Yes  Patient washes hands after animal contact Yes  Education: Hand Washing, Appropriate Animal Interaction   Education Outcome: Acknowledges education.    Affect/Mood: Appropriate   Participation Level: Engaged   Participation Quality: Independent   Behavior: Appropriate   Speech/Thought Process: Focused   Insight: Good   Judgement: Good   Modes of Intervention: Teaching laboratory technician   Patient Response to Interventions:  Engaged   Education Outcome:  In group clarification offered    Clinical Observations/Individualized Feedback: Patient attended session and interacted appropriately with therapy dog and peers. Patient asked appropriate questions about therapy dog and his training. Patient shared stories about their pets at home with group.     Plan: Continue to engage patient in RT group sessions 2-3x/week.   [name], LRT,CTRS 03/22/2023 1:27 PM

## 2023-03-22 NOTE — [facility] Group Notes (Signed)
 [facility] Group Notes:  (Nursing/MHT/Case Management/Adjunct)  Date:  03/22/2023  Time:  10:34 PM  Type of Therapy:   Wrap-up group  Participation Level:  Active  Participation Quality:  Appropriate  Affect:  Appropriate  Cognitive:  Appropriate  Insight:  Appropriate  Engagement in Group:  Engaged  Modes of Intervention:  Education  Summary of Progress/Problems: Pt goal to talk to Child psychotherapist. Rated day 8/10.   Noah Delaine 03/22/2023, 10:34 PM

## 2023-03-22 NOTE — Progress Notes (Signed)
   03/22/23 0930  Psych Admission Type (Psych Patients Only)  Admission Status Voluntary  Psychosocial Assessment  Patient Complaints Anxiety  Eye Contact Fair  Facial Expression Worried  Affect Anxious  Speech Logical/coherent  Interaction Assertive  Motor Activity Other (Comment) (WDL)  Appearance/Hygiene Unremarkable  Behavior Characteristics Cooperative;Appropriate to situation  Mood Anxious;Pleasant  Thought Process  Coherency WDL  Content WDL  Delusions None reported or observed  Perception WDL  Hallucination None reported or observed  Judgment WDL  Confusion None  Danger to Self  Current suicidal ideation? Denies  Agreement Not to Harm Self Yes  Description of Agreement verbal  Danger to Others  Danger to Others None reported or observed

## 2023-03-22 NOTE — Group Note (Signed)
 LCSW Group Therapy Note   Group Date: 03/22/2023 Start Time: 1100 End Time: 1200  Participation:  patient was present for the first 15 minutes, and left when one of professionals came to get her.   Type of Therapy:  Group Therapy  Topic:  Healing From Within: Understanding Our Past, Building Our Future   Objective:  To help participants understand the impact of early experiences on mental and physical health, with a focus on Adverse Childhood Experiences (ACEs), and to explore ways to build resilience and healing.  Goals: Understand ACEs and Their Impact: Learn how childhood experiences shape mental and physical health. Build Resilience: Develop strategies for overcoming challenges and creating positive change. Promote Healing: Recognize the value of support and the possibility of healing through therapy and self-care.  Therapeutic Modalities Used: Psychoeducation: Sharing information about ACEs and their effects. Cognitive Behavioral Therapy (CBT): Helping reframe negative thought patterns. Trauma-Informed Therapy: Creating a safe, supportive space for healing.   Moria Brophy O Samwise Eckardt, LCSWA 03/22/2023  1:21 PM

## 2023-03-22 NOTE — Progress Notes (Signed)
 Pt c/o feeling dizzy, patient sat down in chair, Blood pressure 131/83, HR 90. Patient recovered after a few minutes. Educated patient on fall precautions and safety, patient instructed to get up slowly, or change position slowly and to notify staff if it occurs again, patient verbalized understanding, denies feeling dizzy after sitting down for a few minutes.

## 2023-03-23 ENCOUNTER — Encounter (HOSPITAL_COMMUNITY): Payer: Self-pay

## 2023-03-23 DIAGNOSIS — F3163 Bipolar disorder, current episode mixed, severe, without psychotic features: Secondary | ICD-10-CM | POA: Diagnosis not present
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
## 2023-03-23 NOTE — [facility] Group Notes (Signed)
 Adult Psychoeducational Group Note  Date:  03/23/2023 Time:  10:16 PM  Group Topic/Focus:  Wrap-Up Group:   The focus of this group is to help patients review their daily goal of treatment and discuss progress on daily workbooks.  Participation Level:  Active  Participation Quality:  Appropriate  Affect:  Appropriate  Cognitive:  Appropriate  Insight: Appropriate  Engagement in Group:  Engaged  Modes of Intervention:  Discussion  Additional Comments:  [name] came to wrap up grou  [name] 03/23/2023, 10:16 PM

## 2023-03-23 NOTE — Plan of Care (Signed)

## 2023-03-23 NOTE — BH IP Treatment Plan (Addendum)
 Interdisciplinary Treatment and Diagnostic Plan Update  03/23/2023 Time of Session: 950AM - UPDATE [name] MRN: [id number]  Principal Diagnosis: Bipolar 1 disorder, mixed, severe (HCC)  Secondary Diagnoses: Principal Problem:   Bipolar 1 disorder, mixed, severe (HCC)   Current Medications:  Current Facility-Administered Medications  Medication Dose Route Frequency Provider Last Rate Last Admin   acetaminophen (TYLENOL) tablet 650 mg  650 mg Oral Q6H PRN Bing Neighbors, NP       albuterol (VENTOLIN HFA) 108 (90 Base) MCG/ACT inhaler 1-2 puff  1-2 puff Inhalation Q6H PRN Armandina Stammer I, NP       alum & mag hydroxide-simeth (MAALOX/MYLANTA) 200-200-20 MG/5ML suspension 30 mL  30 mL Oral Q4H PRN Bing Neighbors, NP       ARIPiprazole (ABILIFY) tablet 7.5 mg  7.5 mg Oral Daily Nwoko, Nicole Kindred I, NP   7.5 mg at 03/23/23 6440   atomoxetine (STRATTERA) capsule 60 mg  60 mg Oral Daily Armandina Stammer I, NP   60 mg at 03/23/23 3474   busPIRone (BUSPAR) tablet 10 mg  10 mg Oral TID Bing Neighbors, NP   10 mg at 03/23/23 2595   haloperidol (HALDOL) tablet 5 mg  5 mg Oral TID PRN Bing Neighbors, NP   5 mg at 03/22/23 2102   And   diphenhydrAMINE (BENADRYL) capsule 50 mg  50 mg Oral TID PRN Bing Neighbors, NP   50 mg at 03/22/23 2102   haloperidol lactate (HALDOL) injection 10 mg  10 mg Intramuscular TID PRN Bing Neighbors, NP       And   diphenhydrAMINE (BENADRYL) injection 50 mg  50 mg Intramuscular TID PRN Bing Neighbors, NP       And   LORazepam (ATIVAN) injection 2 mg  2 mg Intramuscular TID PRN Bing Neighbors, NP       haloperidol lactate (HALDOL) injection 5 mg  5 mg Intramuscular TID PRN Bing Neighbors, NP       And   diphenhydrAMINE (BENADRYL) injection 50 mg  50 mg Intramuscular TID PRN Bing Neighbors, NP       And   LORazepam (ATIVAN) injection 2 mg  2 mg Intramuscular TID PRN Bing Neighbors, NP       doxycycline (VIBRA-TABS) tablet 100 mg  100  mg Oral Q12H Lauro Franklin, MD   100 mg at 03/23/23 6387   gabapentin (NEURONTIN) capsule 200 mg  200 mg Oral TID Lauro Franklin, MD   200 mg at 03/23/23 5643   hydrOXYzine (ATARAX) tablet 50 mg  50 mg Oral QHS Armandina Stammer I, NP   50 mg at 03/22/23 2102   loratadine (CLARITIN) tablet 10 mg  10 mg Oral Daily Armandina Stammer I, NP   10 mg at 03/23/23 3295   magnesium hydroxide (MILK OF MAGNESIA) suspension 30 mL  30 mL Oral Daily PRN Bing Neighbors, NP       melatonin tablet 5 mg  5 mg Oral QHS Nwoko, Agnes I, NP   5 mg at 03/22/23 2103   nicotine (NICODERM CQ - dosed in mg/24 hours) patch 14 mg  14 mg Transdermal Daily Starleen Blue, NP   14 mg at 03/23/23 0835   prazosin (MINIPRESS) capsule 1 mg  1 mg Oral QHS Bing Neighbors, NP   1 mg at 03/22/23 2104   sertraline (ZOLOFT) tablet 50 mg  50 mg Oral Daily Lauro Franklin, MD   50  mg at 03/23/23 0834   topiramate (TOPAMAX) tablet 25 mg  25 mg Oral BID Armandina Stammer I, NP   25 mg at 03/23/23 2130   vitamin D3 (CHOLECALCIFEROL) tablet 1,000 Units  1,000 Units Oral Daily Armandina Stammer I, NP   1,000 Units at 03/23/23 8657   PTA Medications: Medications Prior to Admission  Medication Sig Dispense Refill Last Dose/Taking   albuterol (VENTOLIN HFA) 108 (90 Base) MCG/ACT inhaler Inhale 1-2 puffs into the lungs every 6 (six) hours as needed for wheezing or shortness of breath.      ARIPiprazole (ABILIFY) 5 MG tablet Take 1 tablet (5 mg total) by mouth daily. (Patient not taking: Reported on 03/16/2023) 30 tablet 3    atomoxetine (STRATTERA) 60 MG capsule Take 1 capsule (60 mg total) by mouth daily. (Patient not taking: Reported on 03/16/2023) 30 capsule 3    busPIRone (BUSPAR) 10 MG tablet Take 1 tablet (10 mg total) by mouth 3 (three) times daily. (Patient not taking: Reported on 03/16/2023) 90 tablet 3    Cholecalciferol (VITAMIN D3 PO) Take 1 tablet by mouth daily.      lidocaine (LIDODERM) 5 % Place 2 patches onto the skin daily.  Remove & Discard patch within 12 hours or as directed by MD 60 patch 0    loratadine (CLARITIN) 10 MG tablet Take 1 tablet (10 mg total) by mouth daily. 30 tablet 0    Multiple Vitamin (MULTIVITAMIN WITH MINERALS) TABS tablet Take 1 tablet by mouth daily.      prazosin (MINIPRESS) 1 MG capsule Take 1 capsule (1 mg total) by mouth at bedtime. (Patient taking differently: Take 1 mg by mouth at bedtime as needed (For nightmares).) 30 capsule 3    topiramate (TOPAMAX) 25 MG tablet Take 1 tablet (25 mg total) by mouth 2 (two) times daily. (Patient not taking: Reported on 03/16/2023) 60 tablet 3    traZODone (DESYREL) 50 MG tablet Take 1 tablet (50 mg total) by mouth at bedtime as needed for sleep. 30 tablet 3     Patient Stressors: Financial difficulties   Marital or family conflict   Occupational concerns   Traumatic event    Patient Strengths: Capable of independent living  Forensic psychologist fund of knowledge  Motivation for treatment/growth   Treatment Modalities: Medication Management, Group therapy, Case management,  1 to 1 session with clinician, Psychoeducation, Recreational therapy.   Physician Treatment Plan for Primary Diagnosis: Bipolar 1 disorder, mixed, severe (HCC) Long Term Goal(s): Improvement in symptoms so as ready for discharge   Short Term Goals: Ability to identify and develop effective coping behaviors will improve Ability to maintain clinical measurements within normal limits will improve Compliance with prescribed medications will improve Ability to identify triggers associated with substance abuse/mental health issues will improve Ability to identify changes in lifestyle to reduce recurrence of condition will improve Ability to verbalize feelings will improve Ability to disclose and discuss suicidal ideas Ability to demonstrate self-control will improve  Medication Management: Evaluate patient's response, side effects, and tolerance of medication  regimen.  Therapeutic Interventions: 1 to 1 sessions, Unit Group sessions and Medication administration.  Evaluation of Outcomes: Progressing  Physician Treatment Plan for Secondary Diagnosis: Principal Problem:   Bipolar 1 disorder, mixed, severe (HCC)  Long Term Goal(s): Improvement in symptoms so as ready for discharge   Short Term Goals: Ability to identify and develop effective coping behaviors will improve Ability to maintain clinical measurements within normal limits will improve Compliance with  prescribed medications will improve Ability to identify triggers associated with substance abuse/mental health issues will improve Ability to identify changes in lifestyle to reduce recurrence of condition will improve Ability to verbalize feelings will improve Ability to disclose and discuss suicidal ideas Ability to demonstrate self-control will improve     Medication Management: Evaluate patient's response, side effects, and tolerance of medication regimen.  Therapeutic Interventions: 1 to 1 sessions, Unit Group sessions and Medication administration.  Evaluation of Outcomes: Progressing   RN Treatment Plan for Primary Diagnosis: Bipolar 1 disorder, mixed, severe (HCC) Long Term Goal(s): Knowledge of disease and therapeutic regimen to maintain health will improve  Short Term Goals: Ability to remain free from injury will improve, Ability to demonstrate self-control, Ability to verbalize feelings will improve, Ability to disclose and discuss suicidal ideas, and Compliance with prescribed medications will improve Medication Management: RN will administer medications as ordered by provider, will assess and evaluate patient's response and provide education to patient for prescribed medication. RN will report any adverse and/or side effects to prescribing provider.  Therapeutic Interventions: 1 on 1 counseling sessions, Psychoeducation, Medication administration, Evaluate responses to  treatment, Monitor vital signs and CBGs as ordered, Perform/monitor CIWA, COWS, AIMS and Fall Risk screenings as ordered, Perform wound care treatments as ordered.  Evaluation of Outcomes: Progressing   LCSW Treatment Plan for Primary Diagnosis: Bipolar 1 disorder, mixed, severe (HCC) Long Term Goal(s): Safe transition to appropriate next level of care at discharge, Engage patient in therapeutic group addressing interpersonal concerns.  Short Term Goals: Engage patient in aftercare planning with referrals and resources, Increase social support, Increase emotional regulation, Facilitate acceptance of mental health diagnosis and concerns, and Identify triggers associated with mental health/substance abuse issues  Therapeutic Interventions: Assess for all discharge needs, 1 to 1 time with Social worker, Explore available resources and support systems, Assess for adequacy in community support network, Educate family and significant other(s) on suicide prevention, Complete Psychosocial Assessment, Interpersonal group therapy.  Evaluation of Outcomes: Progressing   Progress in Treatment: Attending groups: Yes. Participating in groups: Yes. Taking medication as prescribed: Yes. Toleration medication: Yes. Family/Significant other contact made: No, will contact:  Silvano Bilis (best friend), patient couldn't remember her phone number at the time of the PSA Patient understands diagnosis:  Discussing patient identified problems/goals with staff: Yes Medical problems stabilized or resolved: Yes. Denies suicidal/homicidal ideation: Yes. Issues/concerns per patient self-inventory: No.   New problem(s) identified: No   New Short Term/Long Term Goal(s):     medication stabilization, elimination of SI thoughts, development of comprehensive mental wellness plan.    Patient Goals:  "I want to become emotionally regulated.  My mind has been feeling foggy for a while." Patient admitted to using weed  recently. She stated that she hasn't used hard drugs, such as crack or heroin, since December 20, 2022. She said that she would like to go to a long-term treatment program.   Discharge Plan or Barriers:  Patient recently admitted. CSW will continue to follow and assess for appropriate referrals and possible discharge planning.    Reason for Continuation of Hospitalization: Anxiety Medication stabilization Suicidal ideation   Estimated Length of Stay:  3-5 days  Last 3 [place] Suicide Severity Risk Score: Flowsheet Row [facility] (Current[facility] Most recent reading at 03/16/2023  6:00 PM ED from 03/16/2023 in [facility] Most recent reading at 03/16/2023  3:37 PM Video Visit from 02/15/2023 in [facility] Most recent reading at 02/15/2023  2:46 PM  C-SSRS RISK CATEGORY Low Risk Low Risk Error: Q7 should not be populated when Q6 is No       Last PHQ 2/9 Scores:    02/15/2023    2:46 PM 12/22/2022   10:26 AM 12/06/2022   10:57 AM  Depression screen PHQ 2/9  Decreased Interest 2 3 1   Down, Depressed, Hopeless 2 3 2   PHQ - 2 Score 4 6 3   Altered sleeping 3 3 1   Tired, decreased energy 3 3 3   Change in appetite 3 3 1   Feeling bad or failure about yourself  2 3 3   Trouble concentrating 3 3 3   Moving slowly or fidgety/restless 3 3 3   Suicidal thoughts 1 1 1   PHQ-9 Score 22 25 18   Difficult doing work/chores Very difficult  Very difficult    Scribe for Treatment Team: Esmeralda Arthur 03/23/2023 9:49 AM

## 2023-03-23 NOTE — Progress Notes (Signed)
 CSW made APS complaint related to concerns about Pt's sexual exploitation and sex trafficking.   Received returned call from APS stated that the complaint is unfounded.     [name] 03/23/23/2:50 PM

## 2023-03-23 NOTE — Group Note (Signed)
 Recreation Therapy Group Note   Group Topic:Stress Management  Group Date: 03/23/2023 Start Time: 6962 End Time: 0952 Facilitators: Tomoya Ringwald-McCall, LRT,CTRS Location: 300 Hall Dayroom   Group Topic: Stress Management  Goal Area(s) Addresses:  Patient will identify positive stress management techniques. Patient will identify benefits of using stress management post d/c.  Behavioral Response:   Intervention: Insight Timer App  Activity: Meditation. LRT and patients went over meditation before listening to meditation presented in group. LRT played a meditation that focused on self-compassion and speaking positive over ones self throughout the course of the day. The meditation also focused on accepting yourself as you presently are while working towards growth. Patients were to listen and follow along as meditation played to get the most out of it.    Education:  Stress Management, Discharge Planning.   Education Outcome: Acknowledges Education   Affect/Mood: Appropriate   Participation Level: Engaged   Participation Quality: Independent   Behavior: Attentive    Speech/Thought Process: Focused   Insight: Good   Judgement: Good   Modes of Intervention: App   Patient Response to Interventions:  Attentive   Education Outcome:  In group clarification offered    Clinical Observations/Individualized Feedback: Pt attended and participated in group session. Pt became tearful at one point during session. Pt appeared focused and attentive to meditation.    Plan: Continue to engage patient in RT group sessions 2-3x/week.   Kabe Mckoy-McCall, LRT,CTRS 03/23/2023 12:17 PM

## 2023-03-23 NOTE — Group Note (Signed)
 Date:  03/23/2023 Time:  4:08 PM  Group Topic/Focus:  Identifying Needs:   The focus of this group is to help patients identify their personal needs that have been historically problematic and identify healthy behaviors to address their needs.    Participation Level:  Active  Participation Quality:  Appropriate  Affect:  Appropriate  Cognitive:  Appropriate  Insight: Appropriate  Engagement in Group:  Engaged  Modes of Intervention:  Discussion and Education  Additional Comments:    Arnoldo Hooker 03/23/2023, 4:08 PM

## 2023-03-23 NOTE — [facility] Group Notes (Signed)
 Spirituality group facilitated by Kathleen Argue, BCC.  Group Description: Group focused on topic of hope. Patients participated in facilitated discussion around topic, connecting with one another around experiences and definitions for hope. Group members engaged with visual explorer photos, reflecting on what hope looks like for them today. Group engaged in discussion around how their definitions of hope are present today in hospital.  Modalities: Psycho-social ed, Adlerian, Narrative, MI  Patient Progress: Tyshawn attended group and actively engaged and participated in group conversation and activities.  Comments demonstrated good insight and contributed positively to the group conversation.

## 2023-03-23 NOTE — Progress Notes (Signed)
   03/23/23 1021  Psych Admission Type (Psych Patients Only)  Admission Status Voluntary  Psychosocial Assessment  Patient Complaints Anxiety  Eye Contact Fair  Facial Expression Anxious  Affect Anxious  Speech Logical/coherent  Interaction Assertive  Motor Activity Slow  Appearance/Hygiene Unremarkable  Behavior Characteristics Anxious  Mood Anxious  Thought Process  Coherency WDL  Content WDL  Delusions None reported or observed  Perception WDL  Hallucination None reported or observed  Judgment WDL  Confusion None  Danger to Self  Current suicidal ideation? Denies  Self-Injurious Behavior No self-injurious ideation or behavior indicators observed or expressed   Agreement Not to Harm Self Yes  Description of Agreement Verbal  Danger to Others  Danger to Others None reported or observed

## 2023-03-23 NOTE — Plan of Care (Signed)
  Problem: Education: Goal: Knowledge of [place] [facility] Education information/materials will improve Outcome: Progressing Goal: Verbalization of understanding the information provided will improve Outcome: Progressing   Problem: Activity: Goal: Interest or engagement in activities will improve Outcome: Progressing   Problem: Coping: Goal: Ability to verbalize frustrations and anger appropriately will improve Outcome: Progressing   Problem: Physical Regulation: Goal: Ability to maintain clinical measurements within normal limits will improve Outcome: Progressing

## 2023-03-23 NOTE — Plan of Care (Signed)
   Problem: Education: Goal: Knowledge of [facility] Education information/materials will improve Outcome: Progressing Goal: Emotional status will improve Outcome: Progressing Goal: Mental status will improve Outcome: Progressing Goal: Verbalization of understanding the information provided will improve Outcome: Progressing

## 2023-03-24 ENCOUNTER — Inpatient Hospital Stay (HOSPITAL_COMMUNITY)
Admission: AD | Admit: 2023-03-24 | Discharge: 2023-03-24 | Disposition: A | Discharge status: Home / Self Care | Payer: MEDICAID | Source: Intra-hospital | Attending: Psychiatry | Admitting: Psychiatry

## 2023-03-24 DIAGNOSIS — F3163 Bipolar disorder, current episode mixed, severe, without psychotic features: Secondary | ICD-10-CM | POA: Diagnosis not present

## 2023-03-24 LAB — RESP PANEL BY RT-PCR (RSV, FLU A&B, COVID)  RVPGX2
Influenza A by PCR: NEGATIVE
Influenza B by PCR: NEGATIVE
Resp Syncytial Virus by PCR: NEGATIVE
SARS Coronavirus 2 by RT PCR: NEGATIVE

## 2023-03-24 NOTE — Progress Notes (Signed)
   03/24/23 2045  Psych Admission Type (Psych Patients Only)  Admission Status Voluntary  Psychosocial Assessment  Patient Complaints None  Eye Contact Fair  Facial Expression Animated  Affect Appropriate to circumstance  Speech Logical/coherent  Interaction Assertive  Motor Activity Other (Comment) (WNL)  Appearance/Hygiene Unremarkable  Behavior Characteristics Cooperative;Appropriate to situation  Mood Pleasant  Thought Process  Coherency WDL  Content WDL  Delusions None reported or observed  Perception WDL  Hallucination None reported or observed  Judgment WDL  Confusion None  Danger to Self  Current suicidal ideation? Denies  Description of Suicide Plan none  Self-Injurious Behavior No self-injurious ideation or behavior indicators observed or expressed   Agreement Not to Harm Self Yes  Description of Agreement verbal  Danger to Others  Danger to Others None reported or observed

## 2023-03-24 NOTE — Progress Notes (Addendum)
 D:  Patient's self inventory sheet, patient sleeps good, sleep medication helpful..  Good appetite, high energy level, good concentration.  Denied depression , hopeless and anxiety.  Denied withdrawals.  Denied SI.  Denied physical problems, denied physical pain.   A:  Medications administered per MD orders.  Emotional support and encouragement given patient. R:  Denied SI and HI, contracts for safety.  Denied A/V hallucinations.  Safety maintained with 15 minute checks.

## 2023-03-24 NOTE — [facility] Group Notes (Signed)
 [facility] Group Notes:  (Nursing/MHT/Case Management/Adjunct)  Date:  03/24/2023  Time: 2000  Type of Therapy:   Wrap up group  Participation Level:  Active  Participation Quality:  Appropriate, Attentive, Sharing, and Supportive  Affect:  Appropriate  Cognitive:  Alert  Insight:  Improving  Engagement in Group:  Engaged  Modes of Intervention:  Clarification, Education, and Support  Summary of Progress/Problems: Positive thinking and positive change were discussed.   [name] S 03/24/2023, 9:00 PM

## 2023-03-24 NOTE — Plan of Care (Signed)

## 2023-03-24 NOTE — Group Note (Signed)
 LCSW Group Therapy Note  Group Date: 03/24/2023 Start Time: 1100 End Time: 1200  Participation:  patient was present for the first half of the group session.  She was polite and listened, but didn't participate in the conversation.   Type of Therapy:  Group Therapy  Topic:  "Speaking from the Heart: Communicating with Understanding and Empathy"  Objective: To help participants develop effective communication skills to express themselves clearly, listen actively, and navigate conflicts in a healthy way.  Goals: Increase awareness of verbal and non-verbal communication skills. Practice using "I" statements and active listening techniques. Learn coping strategies for managing communication stress.  Summary: Participants explored the importance of communication, discussed challenges, and practiced skills such as active listening and assertive expression. They reflected on past experiences and identified ways to improve communication in their daily lives.  Therapeutic Modalities: Cognitive-Behavioral Therapy (CBT): Restructuring negative thought patterns in communication. Mindfulness: Staying present and calm during conversations. Psychoeducation: Learning about effective communication techniques.   Alla Feeling, LCSWA 03/24/2023  12:56 PM
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
## 2023-03-24 NOTE — Plan of Care (Signed)
 Nurse discussed anxiety, depression and coping skills with patient.

## 2023-03-24 NOTE — Progress Notes (Signed)
 Plan is for Safe Transport to be at [facility] at 6:30 pm.  Pick up patient and MHT.  Go to [facility] for 7:00 pm appointment for scheduled MRI per MD instructions. Charge nurse, MHT and patient aware of this appointment.

## 2023-03-24 NOTE — Progress Notes (Signed)
 PATIENT'S MRI IS SCHEDULED AT 7;00 PM TODAY 03/24/2023. CHARGE NURSE INFORMED.
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
## 2023-03-24 NOTE — Progress Notes (Signed)
   03/23/23 2150  Psych Admission Type (Psych Patients Only)  Admission Status Voluntary  Psychosocial Assessment  Patient Complaints Anxiety  Eye Contact Fair  Facial Expression Animated  Affect Appropriate to circumstance  Speech Logical/coherent  Interaction Assertive  Motor Activity Other (Comment) (WNL)  Appearance/Hygiene Unremarkable  Behavior Characteristics Appropriate to situation;Cooperative  Mood Pleasant  Thought Process  Coherency WDL  Content WDL  Delusions None reported or observed  Perception WDL  Hallucination None reported or observed  Judgment WDL  Confusion None  Danger to Self  Current suicidal ideation? Denies  Description of Suicide Plan none  Self-Injurious Behavior No self-injurious ideation or behavior indicators observed or expressed   Agreement Not to Harm Self Yes  Description of Agreement verbal  Danger to Others  Danger to Others None reported or observed

## 2023-03-24 NOTE — [facility] Suicide Risk Assessment (Signed)
 [facility] INPATIENT:  Family/Significant Other Suicide Prevention Education  Suicide Prevention Education:  Patient Refusal for Family/Significant Other Suicide Prevention Education: The patient [name] was unable to provide written phone number for family/significant other in order to provide Family/Significant Other Suicide Prevention Education during admission and/or prior to discharge.  Physician notified.   Discharge planning and SPE discussed with Pt.  Joelyn Oms [place], LCSW 03/24/2023, 4:05 PM

## 2023-03-25 ENCOUNTER — Telehealth (HOSPITAL_COMMUNITY): Payer: MEDICAID | Admitting: Psychiatry

## 2023-03-25 DIAGNOSIS — F3163 Bipolar disorder, current episode mixed, severe, without psychotic features: Secondary | ICD-10-CM | POA: Diagnosis not present

## 2023-03-25 MED ORDER — SERTRALINE HCL 50 MG PO TABS
50.0000 mg | ORAL_TABLET | Freq: Every day | ORAL | 0 refills | Status: DC
Start: 2023-03-26 — End: 2023-05-05

## 2023-03-25 MED ORDER — VITAMIN D3 25 MCG PO TABS: 1000.0000 [IU] | ORAL_TABLET | Freq: Every day | ORAL | 0 refills | Status: DC

## 2023-03-25 MED ORDER — PRAZOSIN HCL 1 MG PO CAPS: 1.0000 mg | ORAL_CAPSULE | Freq: Every day | ORAL | 0 refills | Status: DC

## 2023-03-25 MED ORDER — ARIPIPRAZOLE 15 MG PO TABS: 7.5000 mg | ORAL_TABLET | Freq: Every day | ORAL | 0 refills | Status: DC

## 2023-03-25 MED ORDER — MELATONIN 5 MG PO TABS: 5.0000 mg | ORAL_TABLET | Freq: Every day | ORAL | 0 refills | Status: DC

## 2023-03-25 MED ORDER — GABAPENTIN 100 MG PO CAPS: 200.0000 mg | ORAL_CAPSULE | Freq: Three times a day (TID) | ORAL | 0 refills | Status: DC

## 2023-03-25 MED ORDER — TOPIRAMATE 25 MG PO TABS: 25.0000 mg | ORAL_TABLET | Freq: Two times a day (BID) | ORAL | 0 refills | Status: DC

## 2023-03-25 MED ORDER — HYDROXYZINE HCL 50 MG PO TABS: 50.0000 mg | ORAL_TABLET | Freq: Every day | ORAL | 0 refills | Status: DC

## 2023-03-25 MED ORDER — BUSPIRONE HCL 10 MG PO TABS: 10.0000 mg | ORAL_TABLET | Freq: Three times a day (TID) | ORAL | 0 refills | Status: DC

## 2023-03-25 MED ORDER — NICOTINE 14 MG/24HR TD PT24: 14.0000 mg | MEDICATED_PATCH | Freq: Every day | TRANSDERMAL | 0 refills | Status: DC

## 2023-03-25 MED ORDER — ATOMOXETINE HCL 60 MG PO CAPS: 60.0000 mg | ORAL_CAPSULE | Freq: Every day | ORAL | 0 refills | Status: DC

## 2023-03-25 NOTE — Progress Notes (Signed)
  [facility] Adult Case Management Discharge Plan :  Will you be returning to the same living situation after discharge:  No. Pt will be discharging to [facility] in [facility] At discharge, do you have transportation home?: Yes,  CSW arranged transportation via [place] [employer] for 10:15AM Do you have the ability to pay for your medications: Yes,  pt has active [employer]  Release of information consent forms completed and in the chart;  Patient's signature needed at discharge.  Patient to Follow up at:  Follow-up Information     [facility] Follow up.   Why: Please continue with this provider if you wish to receive suboxone services. Contact information: [address] [place], [place] [address]   P: [phone] Email: [email or web address] Fax: [phone]        [facility] Follow up on 04/15/2023.   Specialty: Behavioral Health Why: You have an appointment for medication management services on 04/15/23 at 9:30 am, Virtual. Contact information: [address] [place] [address] [phone]        [facility]. Go on 03/29/2023.   Specialty: Professional Counselor Why: You have an appointment on 03/29/23 at 2:00 pm for therapy services, in person. Contact information: [address] [place] [address] [phone]         [facility]. Call on 03/25/2023.   Why: Please text Brianna at [phone] to discuss meeting to start case managment and additional support services. Contact information: [address] [place], [place] [address]  P: [phone]        [facility] Follow up.   Why: Please call this provider to establish primary care services. Contact information: [address], [address] [place], [place] [address]  Phone: [phone]                Next level of care provider has access to [facility] Link:no  Safety Planning and Suicide  Prevention discussed: No. Pt declined consents, information reviewed with pt     Has patient been referred to the Quitline?: Patient refused referral for treatment  Patient has been referred for addiction treatment: Patient refused referral for treatment.  Kathi Der, LCSWA 03/25/2023, 9:18 AM

## 2023-03-25 NOTE — Transportation (Signed)
 03/25/2023  [name] DOB: [birth year] MRN: [id number]   RIDER WAIVER AND RELEASE OF LIABILITY  For the purposes of helping with transportation needs, [facility] partners with outside transportation providers (taxi companies, [place], Catering manager.) to give [employer] patients or other approved people the choice of on-demand rides [employer]") to our buildings for non-emergency visits.  By using [employer], I, the person signing this document, on behalf of myself and/or any legal minors (in my care using the [employer]), agree:  Science writer given to me are supplied by independent, outside transportation providers who do not work for, or have any affiliation with, [employer]. [facility] is not a transportation company. [facility] has no control over the quality or safety of the rides I get using [employer]. [facility] has no control over whether any outside ride will happen on time or not. [facility] gives no guarantee on the reliability, quality, safety, or availability on any rides, or that no mistakes will happen. I know and accept that traveling by vehicle (car, truck, SVU, Zenaida Niece, bus, taxi, etc.) has risks of serious injuries such as disability, being paralyzed, and death. I know and agree the risk of using [employer] is mine alone, and not [employer]. Transport Services are provided "as is" and as are available. The transportation providers are in charge for all inspections and care of the vehicles used to provide these rides. I agree not to take legal action against [facility], its agents, employees, officers, directors, representatives, insurers, attorneys, assigns, successors, subsidiaries, and affiliates at any time for any reasons related directly or indirectly to using [employer]. I also agree not to take legal action against [facility] or its affiliates for any injury, death, or damage to property caused by or related to using  [employer]. I have read this Waiver and Release of Liability, and I understand the terms used in it and their legal meaning. This Waiver is freely and voluntarily given with the understanding that my right (or any legal minors) to legal action against [facility] relating to [employer] is knowingly given up to use these services.   I attest that I read the Ride Waiver and Release of Liability to [name], gave Ms. Christell Constant the opportunity to ask questions and answered the questions asked (if any). I affirm that [name] then provided consent for assistance with transportation.
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
## 2023-03-25 NOTE — Progress Notes (Signed)
   03/25/23 1000  Psych Admission Type (Psych Patients Only)  Admission Status Voluntary  Psychosocial Assessment  Patient Complaints Anxiety  Eye Contact Fair  Facial Expression Animated  Affect Appropriate to circumstance  Speech Logical/coherent  Interaction Assertive  Motor Activity Other (Comment) (WDL)  Appearance/Hygiene Unremarkable  Behavior Characteristics Cooperative;Appropriate to situation  Mood Pleasant  Thought Process  Coherency WDL  Content WDL  Delusions None reported or observed  Perception WDL  Hallucination None reported or observed  Judgment WDL  Confusion None  Danger to Self  Current suicidal ideation? Denies  Description of Suicide Plan None  Self-Injurious Behavior No self-injurious ideation or behavior indicators observed or expressed   Agreement Not to Harm Self Yes  Description of Agreement verbal  Danger to Others  Danger to Others None reported or observed   AVS documentation was reviewed with patient and education provided for discharge. Patient belongings in locker were returned and patient signed belongings sheet. Patient provided with paper scripts to obtain medications at discharge. Patient discharged to lobby to meet taxi. Patient denied SI/HI and verbally contracted.

## 2023-04-15 ENCOUNTER — Telehealth (HOSPITAL_COMMUNITY): Payer: MEDICAID | Admitting: Psychiatry

## 2023-04-15 ENCOUNTER — Encounter (HOSPITAL_COMMUNITY): Payer: Self-pay

## 2023-04-27 ENCOUNTER — Telehealth (HOSPITAL_COMMUNITY): Payer: MEDICAID | Admitting: Psychiatry

## 2023-04-27 ENCOUNTER — Encounter (HOSPITAL_COMMUNITY): Payer: Self-pay | Admitting: Psychiatry

## 2023-04-27 DIAGNOSIS — R41840 Attention and concentration deficit: Secondary | ICD-10-CM | POA: Diagnosis not present

## 2023-04-27 DIAGNOSIS — F99 Mental disorder, not otherwise specified: Secondary | ICD-10-CM | POA: Diagnosis not present

## 2023-04-27 DIAGNOSIS — F5105 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 disorder: Secondary | ICD-10-CM | POA: Diagnosis not present

## 2023-04-27 DIAGNOSIS — F1994 Other psychoactive substance use, unspecified with psychoactive substance-induced mood disorder: Secondary | ICD-10-CM

## 2023-04-27 MED ORDER — ATOMOXETINE HCL 60 MG PO CAPS: 60.0000 mg | ORAL_CAPSULE | Freq: Every day | ORAL | 3 refills | Status: DC

## 2023-04-27 MED ORDER — UNISOM SLEEPMELTS 25 MG PO TBDP: 25.0000 mg | ORAL_TABLET | Freq: Every evening | ORAL | 3 refills | Status: DC | PRN

## 2023-04-27 MED ORDER — ARIPIPRAZOLE 15 MG PO TABS: 7.5000 mg | ORAL_TABLET | Freq: Every day | ORAL | 3 refills | Status: DC
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
## 2023-05-02 ENCOUNTER — Telehealth (HOSPITAL_COMMUNITY): Payer: Self-pay

## 2023-05-02 NOTE — Telephone Encounter (Signed)
 Hello,    Pt/pharmacy is requesting for a refill of hydrOXYzine (ATARAX) 50 MG tablets , Prazosin 1mg , and Topiramate 25 mg tablets too be sent to pharmacy.  AT [address]   [place]  [address] [facility]   Pt/Pharmacy is also requesting for a refill of prazosin to be sent to pharmacy.   JNL.    Last refill 03/31/23

## 2023-05-05 ENCOUNTER — Other Ambulatory Visit (HOSPITAL_COMMUNITY): Payer: Self-pay | Admitting: Psychiatry

## 2023-05-05 DIAGNOSIS — F411 Generalized anxiety disorder: Secondary | ICD-10-CM

## 2023-05-05 DIAGNOSIS — F4310 Post-traumatic stress disorder, unspecified: Secondary | ICD-10-CM

## 2023-05-05 MED ORDER — HYDROXYZINE HCL 50 MG PO TABS
50.0000 mg | ORAL_TABLET | Freq: Every day | ORAL | 0 refills | Status: DC
Start: 2023-05-05 — End: 2023-11-09

## 2023-05-05 MED ORDER — SERTRALINE HCL 50 MG PO TABS: 50.0000 mg | ORAL_TABLET | Freq: Every day | ORAL | 0 refills | Status: DC

## 2023-05-05 MED ORDER — TOPIRAMATE 25 MG PO TABS: 25.0000 mg | ORAL_TABLET | Freq: Two times a day (BID) | ORAL | 0 refills | Status: DC

## 2023-05-05 MED ORDER — PRAZOSIN HCL 1 MG PO CAPS: 1.0000 mg | ORAL_CAPSULE | Freq: Every day | ORAL | 0 refills | Status: DC

## 2023-05-05 NOTE — Telephone Encounter (Signed)
 Medication sent to preferred pharmacy

## 2023-05-05 NOTE — Telephone Encounter (Signed)
 Hello, Im not sure how to take you out sorry, I saw your earlier messages.    JNL

## 2023-05-12 ENCOUNTER — Inpatient Hospital Stay (HOSPITAL_COMMUNITY)
Admission: AD | Admit: 2023-05-12 | Discharge: 2023-05-12 | Disposition: A | Discharge status: Home / Self Care | Payer: MEDICAID | Attending: Obstetrics & Gynecology | Admitting: Obstetrics & Gynecology

## 2023-05-12 ENCOUNTER — Encounter (HOSPITAL_COMMUNITY): Payer: Self-pay

## 2023-05-12 DIAGNOSIS — Z202 Contact with and (suspected) exposure to infections with a predominantly sexual mode of transmission: Secondary | ICD-10-CM | POA: Diagnosis not present

## 2023-05-12 DIAGNOSIS — Z3A08 8 weeks gestation of pregnancy: Secondary | ICD-10-CM | POA: Insufficient documentation

## 2023-05-12 DIAGNOSIS — A599 Trichomoniasis, unspecified: Secondary | ICD-10-CM

## 2023-05-12 DIAGNOSIS — Z3201 Encounter for pregnancy test, result positive: Secondary | ICD-10-CM | POA: Diagnosis present

## 2023-05-12 LAB — WET PREP, GENITAL
Clue Cells Wet Prep HPF POC: NONE SEEN
Sperm: NONE SEEN
WBC, Wet Prep HPF POC: >=10 — AB (ref <10)
Yeast Wet Prep HPF POC: NONE SEEN

## 2023-05-12 LAB — HEPATITIS C ANTIBODY: HCV Ab: NONREACTIVE

## 2023-05-12 LAB — POCT PREGNANCY, URINE: Preg Test, Ur: POSITIVE — AB

## 2023-05-12 LAB — HEPATITIS B SURFACE ANTIGEN: Hepatitis B Surface Ag: NONREACTIVE

## 2023-05-12 LAB — HIV ANTIBODY (ROUTINE TESTING W REFLEX): HIV Screen 4th Generation wRfx: NONREACTIVE

## 2023-05-12 MED ORDER — METRONIDAZOLE 500 MG PO TABS: 500.0000 mg | ORAL_TABLET | Freq: Two times a day (BID) | ORAL | 0 refills | Status: DC

## 2023-05-12 NOTE — MAU Note (Signed)
..  [name] is a 23 y.o. at Unknown here in MAU reporting: missed periods, breast tenderness, nausea and a positive home pregnancy test. Denies pain or vaginal bleeding.   LMP: 03/12/23 Onset of complaint: n/a Pain score: none Vitals:   05/12/23 1605  BP: 126/67  Pulse: (!) 104  Resp: 16  Temp: 98.8 F (37.1 C)  SpO2: 100%     FHT:n/a Lab orders placed from triage:
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
## 2023-05-12 NOTE — Discharge Instructions (Signed)
 [facility] [employer] for [employer] at [facility]             [address], [place], [place] [address] [phone]  Center for [facility] at [facility]                                                             [address], [place], [place], [address] [phone]  Center for [facility] at [facility] [address], [place], [place], [address] [phone]  Center for [facility] at [facility] [address], [place], [place], [address] [phone]  Center for [facility] at [facility]                                 [address] [place], [place], [place], [address] [phone]  Center for [facility]                                    [address], [place], [place], [address] [phone]  Center for [facility] at [facility] [address], [place], [place], [address]                              [facility] [address], [place], [place], [address] [phone]  [facility]         Phone: [phone]  [facility]      Phone: [phone]   [facility]      Phone: [phone]  [facility]Family [facility]         Phone: [phone]   [facility]-Maternity    Phone: [phone]  [facility]      Phone: [phone]  [facility]     Phone: [phone]  [facility]        Phone: [phone]  [facility] [place]) [phone]  [facility]      Phone: [phone]

## 2023-05-13 LAB — GC/CHLAMYDIA PROBE AMP (~~LOC~~) NOT AT ARMC
Chlamydia: NEGATIVE
Comment: NEGATIVE
Comment: NORMAL
Neisseria Gonorrhea: NEGATIVE

## 2023-05-13 LAB — RPR: RPR Ser Ql: NONREACTIVE

## 2023-05-16 ENCOUNTER — Ambulatory Visit: Payer: MEDICAID | Admitting: *Deleted

## 2023-05-16 ENCOUNTER — Other Ambulatory Visit (INDEPENDENT_AMBULATORY_CARE_PROVIDER_SITE_OTHER): Payer: MEDICAID

## 2023-05-16 VITALS — BP 113/73 | HR 81 | Wt 197.4 lb

## 2023-05-16 DIAGNOSIS — O0991 Supervision of high risk pregnancy, unspecified, first trimester: Secondary | ICD-10-CM

## 2023-05-16 DIAGNOSIS — O3680X0 Pregnancy with inconclusive fetal viability, not applicable or unspecified: Secondary | ICD-10-CM

## 2023-05-16 DIAGNOSIS — Z1339 Encounter for screening examination for other mental health and behavioral disorders: Secondary | ICD-10-CM | POA: Diagnosis not present

## 2023-05-16 DIAGNOSIS — O0990 Supervision of high risk pregnancy, unspecified, unspecified trimester: Secondary | ICD-10-CM | POA: Insufficient documentation

## 2023-05-16 DIAGNOSIS — Z3A11 11 weeks gestation of pregnancy: Secondary | ICD-10-CM

## 2023-05-16 MED ORDER — BLOOD PRESSURE KIT DEVI
1.0000 | 0 refills | Status: DC
Start: 2023-05-16 — End: 2023-12-01

## 2023-05-16 MED ORDER — VALACYCLOVIR HCL 500 MG PO TABS: 500.0000 mg | ORAL_TABLET | Freq: Two times a day (BID) | ORAL | 6 refills | Status: DC

## 2023-05-16 MED ORDER — VITAFOL GUMMIES 3.33-0.333-34.8 MG PO CHEW: 1.0000 | CHEWABLE_TABLET | Freq: Every day | ORAL | 5 refills | Status: AC

## 2023-05-16 NOTE — Progress Notes (Signed)
 caNew OB Intake  I connected with [name]  on 05/16/23 at  2:10 PM EDT by In Person Visit and verified that I am speaking with the correct person using two identifiers. Nurse is located at [facility] and pt is located at [place].  I discussed the limitations, risks, security and privacy concerns of performing an evaluation and management service by telephone and the availability of in person appointments. I also discussed with the patient that there may be a patient responsible charge related to this service. The patient expressed understanding and agreed to proceed.  I explained I am completing New OB Intake today. We discussed EDD of 12/17/2023, by Last Menstrual Period. Pt is [id number]. I reviewed her allergies, medications and Medical/Surgical/OB history.    Patient Active Problem List   Diagnosis Date Noted   Bipolar 1 disorder, mixed, severe (HCC) 03/16/2023   Need for community resource 12/20/2022   Obesity (BMI 30-39.9) 11/04/2022   Alcohol use disorder 11/04/2022   MDD (major depressive disorder), recurrent severe, 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/2022   Headache 08/17/2022   Panic disorder 08/14/2022   Cannabis use disorder 08/14/2022   Stimulant use disorder 08/14/2022   Opioid use disorder 08/14/2022   Gonorrhea 04/21/2022   Severe bipolar I disorder, most recent episode depressed (HCC) 04/15/2022   Bipolar affective disorder, depressed, severe, with psychotic behavior (HCC) 09/16/2020   Attention and concentration deficit 08/20/2020   Irritable bowel syndrome 03/25/2020   Morbid obesity (HCC) 03/25/2020   Substance induced mood disorder (HCC) 03/24/2020   Asthma due to seasonal allergies 03/13/2020   Seasonal allergies 03/13/2020   Tobacco use disorder 03/13/2020   Homelessness 03/13/2020   Family history of breast cancer 03/13/2020   ASCUS with positive high risk HPV cervical 03/13/2020   Bipolar affective disorder, current episode manic (HCC) 01/24/2020   Insomnia due  to other mental disorder 01/24/2020   Polysubstance abuse (HCC) 06/08/2017   Elevated blood pressure reading 04/13/2017   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 Discussed the nature of our practice with multiple providers including residents and students. Due to the size of the practice, the delivering provider may not be the same as those providing prenatal care.   Patient is interested in water  birth.  MyChart/Babyscripts MyChart access verified. I explained pt will have some visits in office and some virtually. Babyscripts instructions given and order placed. Patient verifies receipt of registration text/e-mail. Account successfully created and app downloaded. If patient is a candidate for Optimized scheduling, add to sticky note.   Blood Pressure Cuff/Weight Scale Blood pressure cuff ordered for patient to pick-up from [employer]. Explained after first prenatal appt pt will check weekly and document in Babyscripts. Patient does not have weight scale; patient may purchase if they desire to track weight weekly in Babyscripts.  Anatomy US  Explained first scheduled US  will be around 19 weeks. Anatomy US  scheduled for TBD at TBD.  Is patient a candidate for Babyscripts Optimization? No, due to Risk Factors   First visit review I reviewed new OB appt with patient. Explained pt will be seen by Raford Bunk at first visit. Discussed [facility] [place] genetic screening with patient. Requests Panor[facility] and [facility].. Routine prenatal  labs  collected at today's visit.    Last Pap Diagnosis  Date Value Ref Range Status  01/22/2020 (A)  Final   - Atypical squamous cells of undetermined  significance (ASC-US )    Donette Furlong, RN 05/16/2023  2:04 PM

## 2023-05-16 NOTE — Patient Instructions (Signed)
 The Center for [employer] has a partnership with the [employer] to provide prenatal navigation for the most needed resources in our community. In order to see how we can help connect you to these resources we need consent to contact you. Please complete the very short consent using the link below:   English Link: https://[email or web address].tfaforms.net/283?site=16  Spanish Link: https://[place].tfaforms.net/287?site=16  Options for Doula Care in the [place]  As you review your birthing options, consider having a birth doula. A doula is trained to provide support before, during and just after you give birth. There are also postpartum doulas that help you adjust to new parenthood.  While doulas do not provide medical care, they do provide emotional, physical and educational support. A few months before your baby arrives, doulas can help answer questions, ease concerns and help you create and support your birthing plan.    Doulas can help reduce your stress and comfort you and your partner. They can help you cope with labor by helping you use breathing techniques, massage, creative labor positioning, essential oils and affirmations.   Studies show that the benefits of having a doula include:   A more positive birth experience  Fewer requests for pain-relief medication  Less likelihood of cesarean section, commonly called a c-section   Doulas are typically hired via a [employer] account planner between you and the doula. We are happy to provide a list of the most active doulas in the area, all of whom are credentialed by [facility] and will not count as a visitor at your birth.  There are several options for no-cost doula care at our hospital, including:  [facility] Volunteer Doula Program Every [employer] Program A Cure 4 Moms Doula Study (available only at [facility], [place], [place] and [facility] offices)  For more information on these programs or to receive  a list of doulas active in our area, please email [email or web address]

## 2023-05-17 LAB — CBC/D/PLT+RPR+RH+ABO+RUBIGG...
Antibody Screen: NEGATIVE
Basophils Absolute: 0 10*3/uL (ref 0.0–0.2)
Basos: 0 %
EOS (ABSOLUTE): 0 10*3/uL (ref 0.0–0.4)
Eos: 0 %
HCV Ab: NONREACTIVE
HIV Screen 4th Generation wRfx: NONREACTIVE
Hematocrit: 36.9 % (ref 34.0–46.6)
Hemoglobin: 12.7 g/dL (ref 11.1–15.9)
Hepatitis B Surface Ag: NEGATIVE
Immature Grans (Abs): 0 10*3/uL (ref 0.0–0.1)
Immature Granulocytes: 0 %
Lymphocytes Absolute: 1.6 10*3/uL (ref 0.7–3.1)
Lymphs: 22 %
MCH: 32.3 pg (ref 26.6–33.0)
MCHC: 34.4 g/dL (ref 31.5–35.7)
MCV: 94 fL (ref 79–97)
Monocytes Absolute: 0.5 10*3/uL (ref 0.1–0.9)
Monocytes: 6 %
Neutrophils Absolute: 5.3 10*3/uL (ref 1.4–7.0)
Neutrophils: 72 %
Platelets: 195 10*3/uL (ref 150–450)
RBC: 3.93 x10E6/uL (ref 3.77–5.28)
RDW: 12.3 % (ref 11.7–15.4)
RPR Ser Ql: NONREACTIVE
Rh Factor: POSITIVE
Rubella Antibodies, IGG: 1.69 {index} (ref >0.99)
WBC: 7.4 10*3/uL (ref 3.4–10.8)

## 2023-05-17 LAB — HCV INTERPRETATION

## 2023-05-18 LAB — URINE CULTURE, OB REFLEX

## 2023-05-18 LAB — CULTURE, OB URINE

## 2023-05-21 LAB — PANORAMA PRENATAL TEST FULL PANEL:PANORAMA TEST PLUS 5 ADDITIONAL MICRODELETIONS: FETAL FRACTION: 8.3

## 2023-05-25 LAB — HORIZON CUSTOM: REPORT SUMMARY: NEGATIVE

## 2023-05-30 ENCOUNTER — Other Ambulatory Visit (HOSPITAL_COMMUNITY)
Admission: RE | Admit: 2023-05-30 | Discharge: 2023-05-30 | Disposition: A | Discharge status: Home / Self Care | Payer: MEDICAID | Source: Ambulatory Visit | Attending: Certified Nurse Midwife | Admitting: Certified Nurse Midwife

## 2023-05-30 ENCOUNTER — Encounter: Payer: Self-pay | Admitting: Obstetrics and Gynecology

## 2023-05-30 ENCOUNTER — Ambulatory Visit (HOSPITAL_COMMUNITY): Payer: MEDICAID | Admitting: Clinical

## 2023-05-30 ENCOUNTER — Ambulatory Visit: Payer: MEDICAID | Admitting: Obstetrics and Gynecology

## 2023-05-30 VITALS — BP 106/70 | HR 81 | Wt 198.0 lb

## 2023-05-30 DIAGNOSIS — Z1339 Encounter for screening examination for other mental health and behavioral disorders: Secondary | ICD-10-CM | POA: Diagnosis not present

## 2023-05-30 DIAGNOSIS — Z3A13 13 weeks gestation of pregnancy: Secondary | ICD-10-CM | POA: Diagnosis not present

## 2023-05-30 DIAGNOSIS — F332 Major depressive disorder, recurrent severe without psychotic features: Secondary | ICD-10-CM

## 2023-05-30 DIAGNOSIS — Z8759 Personal history of other complications of pregnancy, childbirth and the puerperium: Secondary | ICD-10-CM | POA: Diagnosis not present

## 2023-05-30 DIAGNOSIS — F314 Bipolar disorder, current episode depressed, severe, without psychotic features: Secondary | ICD-10-CM

## 2023-05-30 DIAGNOSIS — O09299 Supervision of pregnancy with other poor reproductive or obstetric history, unspecified trimester: Secondary | ICD-10-CM | POA: Insufficient documentation

## 2023-05-30 DIAGNOSIS — O0991 Supervision of high risk pregnancy, unspecified, first trimester: Secondary | ICD-10-CM

## 2023-05-30 DIAGNOSIS — O0990 Supervision of high risk pregnancy, unspecified, unspecified trimester: Secondary | ICD-10-CM | POA: Insufficient documentation

## 2023-05-30 DIAGNOSIS — O99341 Other mental disorders complicating pregnancy, first trimester: Secondary | ICD-10-CM | POA: Diagnosis not present

## 2023-05-30 DIAGNOSIS — Z5941 Food insecurity: Secondary | ICD-10-CM | POA: Diagnosis not present

## 2023-05-30 DIAGNOSIS — O09291 Supervision of pregnancy with other poor reproductive or obstetric history, first trimester: Secondary | ICD-10-CM | POA: Diagnosis not present

## 2023-05-30 MED ORDER — SERTRALINE HCL 50 MG PO TABS
50.0000 mg | ORAL_TABLET | Freq: Every day | ORAL | 3 refills | Status: DC
Start: 2023-05-30 — End: 2023-11-09

## 2023-05-30 MED ORDER — PROMETHAZINE HCL 25 MG PO TABS: 25.0000 mg | ORAL_TABLET | Freq: Four times a day (QID) | ORAL | 1 refills | Status: DC | PRN

## 2023-05-30 MED ORDER — PANTOPRAZOLE SODIUM 40 MG PO TBEC
40.0000 mg | DELAYED_RELEASE_TABLET | Freq: Every day | ORAL | 3 refills | Status: DC
Start: 2023-05-30 — End: 2023-11-09

## 2023-05-30 MED ORDER — ASPIRIN 81 MG PO TBEC: 81.0000 mg | DELAYED_RELEASE_TABLET | Freq: Every day | ORAL | 2 refills | Status: DC

## 2023-05-30 NOTE — Patient Instructions (Addendum)
 Second Trimester of Pregnancy  The second trimester of pregnancy is from week 14 through week 27. This is months 4 through 6 of pregnancy. During the second trimester: Morning sickness is less or has stopped. You may have more energy. You may feel hungry more often. At this time, your unborn baby is growing very fast. At the end of the sixth month, the unborn baby may be up to 12 inches long and weigh about 1 pounds. You will likely start to feel the baby move between 16 and 20 weeks of pregnancy. Body changes during your second trimester Your body continues to change during this time. The changes usually go away after your baby is born. Physical changes You will gain more weight. Your belly will get bigger. You may begin to get stretch marks on your hips, belly, and breasts. Your breasts will keep growing and may hurt. You may get dark spots or blotches on your face. A dark line from your belly button to the pubic area may appear. This line is called linea nigra. Your hair may grow faster and get thicker. Health changes You may have headaches. You may have heartburn. You may pee more often. You may have swollen, bulging veins (varicose veins). You may have trouble pooping (constipation), or swollen veins in the butt that can itch or get painful (hemorrhoids). You may have back pain. This is caused by: Weight gain. Pregnancy hormones that are relaxing the joints in your pelvis. Follow these instructions at home: Medicines Talk to your health care provider if you're taking medicines. Ask if the medicines are safe to take during pregnancy. Your provider may change the medicines that you take. Do not take any medicines unless told to by your provider. Take a prenatal vitamin that has at least 600 micrograms (mcg) of folic acid. Do not use herbal medicines, illegal drugs, or medicines that are not approved by your provider. Eating and drinking While you're pregnant your body needs  extra food for your growing baby. Talk with your provider about what to eat while pregnant. Activity Most women are able to exercise during pregnancy. Exercises may need to change as your pregnancy goes on. Talk to your provider about your activities and exercise routines. Relieving pain and discomfort Wear a good, supportive bra if your breasts hurt. Rest with your legs raised if you have leg cramps or low back pain. Take warm sitz baths to soothe pain from hemorrhoids. Use hemorrhoid cream if your provider says it's okay. Do not douche. Do not use tampons or scented pads. Do not use hot tubs, steam rooms, or saunas. Safety Wear your seatbelt at all times when you're in a car. Talk to your provider if someone hits you, hurts you, or yells at you. Talk with your provider if you're feeling sad or have thoughts of hurting yourself. Lifestyle Certain things can be harmful while you're pregnant. It's best to avoid the following: Do not drink alcohol,smoke, vape, or use products with nicotine  or tobacco in them. If you need help quitting, talk with your provider. Avoid cat litter boxes and soil used by cats. These things carry germs that can cause harm to your pregnancy and your baby. General instructions Keep all follow-up visits. It helps you and your unborn baby stay as healthy as possible. Write down your questions. Take them to your prenatal visits. Your provider will: Talk with you about your overall health. Give you advice or refer you to specialists who can help with different needs,  including: Prenatal education classes. Mental health and counseling. Foods and healthy eating. Ask for help if you need help with food. Where to find more information American Pregnancy Association: americanpregnancy.org [employer] of Obstetricians and Gynecologists: acog.org Office on [employer] Health: TravelLesson.ca Contact a health care provider if: You have a headache that does not go away  when you take medicine. You have any of these problems: You can't eat or drink. You throw up or feel like you may throw up. You have watery poop (diarrhea) for 2 days or more. You have pain when you pee or your pee smells bad. You have been sick for 2 days or more and are not getting better. Contact your provider right away if: You have any of these coming from your vagina: Abnormal discharge. Bad-smelling fluid. Bleeding. Your baby is moving less than usual. You have contractions, belly cramping, or have pain in your pelvis or lower back. You have symptoms of high blood pressure or preeclampsia. These include: A severe, throbbing headache that does not go away. Sudden or extreme swelling of your face, hands, legs, or feet. Vision problems: You see spots. You have blurry vision. Your eyes are sensitive to light. If you can't reach the provider, go to an urgent care or emergency room. Get help right away if: You faint, become confused, or can't think clearly. You have chest pain or trouble breathing. You have any kind of injury, such as from a fall or a car crash. These symptoms may be an emergency. Call 911 right away. Do not wait to see if the symptoms will go away. Do not drive yourself to the hospital. This information is not intended to replace advice given to you by your health care provider. Make sure you discuss any questions you have with your health care provider. Document Revised: 10/14/2022 Document Reviewed: 05/14/2022 Elsevier Patient Education  2024 [employer]. Birth Control Options Birth control is also called contraception. Birth control prevents pregnancy. There are many types of birth control. Work with your health care provider to find the best option for you. Birth control that uses hormones These types of birth control have hormones in them to prevent pregnancy. Birth control implant This is a small tube that is put into the skin of your arm. The tube can  stay in for up to 3 years. Birth control shot These are shots you get every 3 months. Birth control pills This is a pill you take every day. You need to take it at the same time each day. Birth control patch This is a patch that you put on your skin. You change it 1 time each week for 3 weeks. After that, you take the patch off for 1 week. Vaginal ring  This is a soft plastic ring that you put in your vagina. The ring is left in for 3 weeks. Then, you take it out for 1 week. Then, you put a new ring in. Barrier methods  Female condom This is a thin covering that you put on the penis before sex. The condom is thrown away after sex. Female condom This is a soft, loose covering that you put in the vagina before sex. The condom is thrown away after sex. Diaphragm A diaphragm is a soft barrier that is shaped like a bowl. It must be made to fit your body. You put it in the vagina before sex with a chemical that kills sperm called spermicide. A diaphragm should be left in the vagina for  6-8 hours after sex and taken out within 24 hours. You need to replace a diaphragm: Every 1-2 years. After giving birth. After gaining more than 15 lb (6.8 kg). If you have surgery on your pelvis. Cervical cap This is a small, soft cup that fits over the cervix. The cervix is the lowest part of the uterus. It's put in the vagina before sex, along with spermicide. The cap must be made for you. The cap should be left in for 6-8 hours after sex. It is taken out within 48 hours. A cervical cap must be prescribed and fit to your body by a provider. It should be replaced every 2 years. Sponge This is a small sponge that is put into the vagina before sex. It must be left in for at least 6 hours after sex. It must be taken out within 30 hours and thrown away. Spermicides These are chemicals that kill or stop sperm from getting into the uterus. They may be a pill, cream, jelly, or foam that you put into your vagina. They  should be used at least 10-15 minutes before sex. Intrauterine device An intrauterine device (IUD) is a device that's put in the uterus by a provider. There are two types: Hormone IUD. This kind can stay in for 3-5 years. Copper IUD. This kind can stay in for 10 years. Permanent birth control Female tubal ligation This is surgery to block the fallopian tubes. Female sterilization This is a surgery, called a vasectomy, to tie off the tubes that carry sperm in men. This method takes 3 months to work. Other forms of birth control must be used for 3 months. Natural planning methods This means not having sex on the days the female partner could get pregnant. Here are some types of natural planning birth control: Using a calendar: To keep track of the length of each menstrual cycle. To find out what days pregnancy can happen. To plan to not have sex on days when pregnancy can happen. Watching for signs of ovulation and not having sex during this time. The female partner can check for ovulation by keeping track of their temperature each day. They can also look for changes in the mucus that comes from the cervix. Where to find more information Centers for Disease Control and Prevention: TonerPromos.no. Then: Enter "birth control" in the search box. This information is not intended to replace advice given to you by your health care provider. Make sure you discuss any questions you have with your health care provider.    Considering Waterbirth? Guide for patients at Center for [employer] [facility]) Why consider waterbirth? Gentle birth for babies  Less pain medicine used in labor  May allow for passive descent/less pushing  May reduce perineal tears  More mobility and instinctive maternal position changes  Increased maternal relaxation   Is waterbirth safe? What are the risks of infection, drowning or other complications? Infection:  Very low risk (3.7 % for tub vs 4.8% for bed)  7 in 8000  waterbirths with documented infection  Poorly cleaned equipment most common cause  Slightly lower group B strep transmission rate  Drowning  Maternal:  Very low risk  Related to seizures or fainting  Newborn:  Very low risk. No evidence of increased risk of respiratory problems in multiple large studies  Physiological protection from breathing under water   Avoid underwater birth if there are any fetal complications  Once baby's head is out of the water , keep it out.  Birth  complication  Some reports of cord trauma, but risk decreased by bringing baby to surface gradually  No evidence of increased risk of shoulder dystocia. Mothers can usually change positions faster in water  than in a bed, possibly aiding the maneuvers to free the shoulder.   There are 2 things you MUST do to have a waterbirth with [facility]: Attend a waterbirth class at [employer] [facility]   3rd Wednesday of every month from 7-9 pm (virtual during COVID) [employer] at [email or web address] or [email or web address] or by calling [phone] Bring us  the certificate from the class to your prenatal appointment or send via MyChart Meet with a midwife at 36 weeks* to see if you can still plan a waterbirth and to sign the consent.   *We also recommend that you schedule as many of your prenatal visits with a midwife as possible.    Helpful information: You may want to bring a bathing suit top to the hospital to wear during labor but this is optional.  All other supplies are provided by the hospital. Please arrive at the hospital with signs of active labor, and do not wait at home until late in labor. It takes 45 min- 1 hour for fetal monitoring, and check in to your room to take place, plus transport and filling of the waterbirth tub.    Things that would prevent you from having a waterbirth: Premature, <37wks  Previous cesarean birth  Presence of thick meconium-stained fluid  Multiple  gestation (Twins, triplets, etc.)  Uncontrolled diabetes or gestational diabetes requiring medication  Hypertension diagnosed in pregnancy or preexisting hypertension (gestational hypertension, preeclampsia, or chronic hypertension) Fetal growth restriction (your baby measures less than 10th percentile on ultrasound) Heavy vaginal bleeding  Non-reassuring fetal heart rate  Active infection (MRSA, etc.). Group B Strep is NOT a contraindication for waterbirth.  If your labor has to be induced and induction method requires continuous monitoring of the baby's heart rate  Other risks/issues identified by your obstetrical provider   Please remember that birth is unpredictable. Under certain unforeseeable circumstances your provider may advise against giving birth in the tub. These decisions will be made on a case-by-case basis and with the safety of you and your baby as our highest priority. Document Revised: 10/14/2022 Document Reviewed: 03/09/2022 Elsevier Patient Education  2024 [employer].
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
## 2023-05-31 LAB — CERVICOVAGINAL ANCILLARY ONLY
Chlamydia: NEGATIVE
Comment: NEGATIVE
Comment: NEGATIVE
Comment: NORMAL
Neisseria Gonorrhea: NEGATIVE
Trichomonas: NEGATIVE

## 2023-05-31 LAB — HEMOGLOBIN A1C
Est. average glucose Bld gHb Est-mCnc: 103 mg/dL
Hgb A1c MFr Bld: 5.2 % (ref 4.8–5.6)

## 2023-06-03 ENCOUNTER — Encounter: Payer: Self-pay | Admitting: Obstetrics & Gynecology

## 2023-06-03 ENCOUNTER — Telehealth: Payer: MEDICAID

## 2023-06-03 DIAGNOSIS — B3731 Acute candidiasis of vulva and vagina: Secondary | ICD-10-CM

## 2023-06-03 LAB — CYTOLOGY - PAP
Adequacy: ABSENT
Comment: NEGATIVE
Comment: NEGATIVE
Comment: NEGATIVE
Diagnosis: NEGATIVE
HPV 16: NEGATIVE
HPV 18 / 45: NEGATIVE
High risk HPV: POSITIVE — AB

## 2023-06-04 MED ORDER — CLOTRIMAZOLE 1 % VA CREA
1.0000 | TOPICAL_CREAM | Freq: Every day | VAGINAL | 0 refills | Status: AC
Start: 2023-06-04 — End: 2023-06-11

## 2023-06-04 NOTE — Progress Notes (Signed)
 Pills for yeast infection are not recommended during pregnancy. Vaginal creams/inserts therapy are recommended in pregnant patients. I have sent the following:  E-Visit for Vaginal Symptoms  We are sorry that you are not feeling well. Here is how we plan to help! Based on what you shared with me it looks like you: May have a yeast vaginosis  Vaginosis is an inflammation of the vagina that can result in discharge, itching and pain. The cause is usually a change in the normal balance of vaginal bacteria or an infection. Vaginosis can also result from reduced estrogen levels after menopause.  The most common causes of vaginosis are:   Bacterial vaginosis which results from an overgrowth of one on several organisms that are normally present in your vagina.   Yeast infections which are caused by a naturally occurring fungus called candida.   Vaginal atrophy (atrophic vaginosis) which results from the thinning of the vagina from reduced estrogen levels after menopause.   Trichomoniasis which is caused by a parasite and is commonly transmitted by sexual intercourse.  Factors that increase your risk of developing vaginosis include: Medications, such as antibiotics and steroids Uncontrolled diabetes Use of hygiene products such as bubble bath, vaginal spray or vaginal deodorant Douching Wearing damp or tight-fitting clothing Using an intrauterine device (IUD) for birth control Hormonal changes, such as those associated with pregnancy, birth control pills or menopause Sexual activity Having a sexually transmitted infection  Your treatment plan is (clotrimazole ) at bedtime for 7 days  Be sure to take all of the medication as directed. Stop taking any medication if you develop a rash, tongue swelling or shortness of breath. Mothers who are breast feeding should consider pumping and discarding their breast milk while on these antibiotics. However, there is no consensus that infant exposure at  these doses would be harmful.  Remember that medication creams can weaken latex condoms. [name]   HOME CARE:  Good hygiene may prevent some types of vaginosis from recurring and may relieve some symptoms:  Avoid baths, hot tubs and whirlpool spas. Rinse soap from your outer genital area after a shower, and dry the area well to prevent irritation. Don't use scented or harsh soaps, such as those with deodorant or antibacterial action. Avoid irritants. These include scented tampons and pads. Wipe from front to back after using the toilet. Doing so avoids spreading fecal bacteria to your vagina.  Other things that may help prevent vaginosis include:  Don't douche. Your vagina doesn't require cleansing other than normal bathing. Repetitive douching disrupts the normal organisms that reside in the vagina and can actually increase your risk of vaginal infection. Douching won't clear up a vaginal infection. Use a latex condom. Both female and female latex condoms may help you avoid infections spread by sexual contact. Wear cotton underwear. Also wear pantyhose with a cotton crotch. If you feel comfortable without it, skip wearing underwear to bed. Yeast thrives in [employer] Your symptoms should improve in the next day or two.  GET HELP RIGHT AWAY IF:  You have pain in your lower abdomen ( pelvic area or over your ovaries) You develop nausea or vomiting You develop a fever Your discharge changes or worsens You have persistent pain with intercourse You develop shortness of breath, a rapid pulse, or you faint.  These symptoms could be signs of problems or infections that need to be evaluated by a medical provider now.  MAKE SURE YOU   Understand these instructions. Will watch your condition. Will  get help right away if you are not doing well or get worse.  Thank you for choosing an e-visit.  Your e-visit answers were reviewed by a board certified advanced clinical practitioner to  complete your personal care plan. Depending upon the condition, your plan could have included both over the counter or prescription medications.  Please review your pharmacy choice. Make sure the pharmacy is open so you can pick up prescription now. If there is a problem, you may contact your provider through [employer] and have the prescription routed to another pharmacy.  Your safety is important to us . If you have drug allergies check your prescription carefully.   For the next 24 hours you can use MyChart to ask questions about today's visit, request a non-urgent call back, or ask for a work or school excuse. You will get an email in the next two days asking about your experience. I hope that your e-visit has been valuable and will speed your recovery.

## 2023-06-04 NOTE — Progress Notes (Signed)
 I have spent 5 minutes in review of e-visit questionnaire, review and updating patient chart, medical decision making and response to patient.   Claiborne Rigg, NP

## 2023-06-09 ENCOUNTER — Telehealth: Payer: Self-pay | Admitting: Licensed Clinical Social Worker

## 2023-06-09 NOTE — Telephone Encounter (Signed)
 [facility] contacted patient to provide [facility] intro and to schedule [facility] appointment. [facility] left a VM.

## 2023-06-14 ENCOUNTER — Other Ambulatory Visit: Payer: Self-pay | Admitting: Medical Genetics

## 2023-06-27 ENCOUNTER — Ambulatory Visit (INDEPENDENT_AMBULATORY_CARE_PROVIDER_SITE_OTHER): Payer: MEDICAID | Admitting: Obstetrics and Gynecology

## 2023-06-27 ENCOUNTER — Encounter: Payer: Self-pay | Admitting: Obstetrics and Gynecology

## 2023-06-27 VITALS — BP 109/74 | HR 97 | Wt 198.0 lb

## 2023-06-27 DIAGNOSIS — F411 Generalized anxiety disorder: Secondary | ICD-10-CM

## 2023-06-27 DIAGNOSIS — F332 Major depressive disorder, recurrent severe without psychotic features: Secondary | ICD-10-CM

## 2023-06-27 DIAGNOSIS — O99342 Other mental disorders complicating pregnancy, second trimester: Secondary | ICD-10-CM | POA: Diagnosis not present

## 2023-06-27 DIAGNOSIS — O0990 Supervision of high risk pregnancy, unspecified, unspecified trimester: Secondary | ICD-10-CM

## 2023-06-27 DIAGNOSIS — Z8759 Personal history of other complications of pregnancy, childbirth and the puerperium: Secondary | ICD-10-CM | POA: Diagnosis not present

## 2023-06-27 DIAGNOSIS — O0992 Supervision of high risk pregnancy, unspecified, second trimester: Secondary | ICD-10-CM

## 2023-06-27 DIAGNOSIS — Z3A17 17 weeks gestation of pregnancy: Secondary | ICD-10-CM

## 2023-06-27 DIAGNOSIS — O09299 Supervision of pregnancy with other poor reproductive or obstetric history, unspecified trimester: Secondary | ICD-10-CM

## 2023-06-27 NOTE — Patient Instructions (Signed)
   Considering Waterbirth? Guide for patients at Center for [facility]) Why consider waterbirth? Gentle birth for babies  Less pain medicine used in labor  May allow for passive descent/less pushing  May reduce perineal tears  More mobility and instinctive maternal position changes  Increased maternal relaxation   Is waterbirth safe? What are the risks of infection, drowning or other complications? Infection:  Very low risk (3.7 % for tub vs 4.8% for bed)  7 in 8000 waterbirths with documented infection  Poorly cleaned equipment most common cause  Slightly lower group B strep transmission rate  Drowning  Maternal:  Very low risk  Related to seizures or fainting  Newborn:  Very low risk. No evidence of increased risk of respiratory problems in multiple large studies  Physiological protection from breathing under water  Avoid underwater birth if there are any fetal complications  Once baby's head is out of the water, keep it out.  Birth complication  Some reports of cord trauma, but risk decreased by bringing baby to surface gradually  No evidence of increased risk of shoulder dystocia. Mothers can usually change positions faster in water than in a bed, possibly aiding the maneuvers to free the shoulder.   There are 2 things you MUST do to have a waterbirth with [facility]: Attend a waterbirth class at [facility] at [facility]   3rd Wednesday of every month from 7-9 pm (virtual during COVID) [employer] at [email or web address] or [email or web address] or by calling [phone] Bring [place] the certificate from the class to your prenatal appointment or send via MyChart Meet with a midwife at 36 weeks* to see if you can still plan a waterbirth and to sign the consent.   *We also recommend that you schedule as many of your prenatal visits with a midwife as possible.    Helpful information: You may want to bring a bathing suit top to the hospital  to wear during labor but this is optional.  All other supplies are provided by the hospital. Please arrive at the hospital with signs of active labor, and do not wait at home until late in labor. It takes 45 min- 1 hour for fetal monitoring, and check in to your room to take place, plus transport and filling of the waterbirth tub.    Things that would prevent you from having a waterbirth: Premature, <37wks  Previous cesarean birth  Presence of thick meconium-stained fluid  Multiple gestation (Twins, triplets, etc.)  Uncontrolled diabetes or gestational diabetes requiring medication  Hypertension diagnosed in pregnancy or preexisting hypertension (gestational hypertension, preeclampsia, or chronic hypertension) Fetal growth restriction (your baby measures less than 10th percentile on ultrasound) Heavy vaginal bleeding  Non-reassuring fetal heart rate  Active infection (MRSA, etc.). Group B Strep is NOT a contraindication for waterbirth.  If your labor has to be induced and induction method requires continuous monitoring of the baby's heart rate  Other risks/issues identified by your obstetrical provider   Please remember that birth is unpredictable. Under certain unforeseeable circumstances your provider may advise against giving birth in the tub. These decisions will be made on a case-by-case basis and with the safety of you and your baby as our highest priority.    Updated 04/29/21

## 2023-06-27 NOTE — Progress Notes (Addendum)
   PRENATAL VISIT NOTE  Subjective:  [name] is a 23 y.o. G3P1011 at [redacted]w[redacted]d being seen today for ongoing prenatal care.  She is currently monitored for the following issues for this high-risk pregnancy and has Suicidal ideation; Borderline personality disorder (HCC); Generalized anxiety disorder; HSV-2 infection; Polysubstance abuse (HCC); Hx of suicide attempt; PTSD (post-traumatic stress disorder); 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 disorder; Asthma due to seasonal allergies; Seasonal allergies; Tobacco use disorder; Homelessness; Family history of breast cancer; Pap smear of cervix shows high risk HPV present on 05/30/23; Substance induced mood disorder (HCC); Irritable bowel syndrome; Morbid obesity (HCC); Attention and concentration deficit; Gonorrhea; Panic disorder; Cannabis use disorder; Stimulant use disorder; Opioid use disorder; MDD (major depressive disorder), recurrent severe, without psychosis (HCC); Obesity (BMI 30-39.9); Shoulder dystocia during labor and delivery; Alcohol use disorder; Need for community resource; Bipolar 1 disorder, mixed, severe (HCC); Supervision of high risk pregnancy, antepartum; Food insecurity; History of gestational hypertension; and History of shoulder dystocia in prior pregnancy, currently pregnant on their problem list.  Patient reports no complaints.  Contractions: Not present. Vag. Bleeding: None.  Movement: Absent. Denies leaking of fluid.   The following portions of the patient's history were reviewed and updated as appropriate: allergies, current medications, past family history, past medical history, past social history, past surgical history and problem list.   Objective:    Vitals:   06/27/23 1120  BP: 109/74  Pulse: 97  Weight: 198 lb (89.8 kg)    Fetal Status:      Movement: Absent    General: Alert, oriented and cooperative. Patient is in no acute distress.  Skin: Skin is warm and dry. No rash noted.   Cardiovascular: Normal heart rate noted   Respiratory: Normal respiratory effort, no problems with respiration noted  Abdomen: Soft, gravid, appropriate for gestational age.  Pain/Pressure: Absent     Pelvic: Cervical exam deferred        Extremities: Normal range of motion.     Mental Status: Normal mood and affect. Normal behavior. Normal judgment and thought content.   Assessment and Plan:  Pregnancy: G3P1011 at [redacted]w[redacted]d 1. Supervision of high risk pregnancy, antepartum (Primary) Patient is doing well Follow up anatomy ultrasound Patient desires water  birth- information provided Patient declined AFP today  2. [redacted] weeks gestation of pregnancy   3. History of gestational hypertension Stable Continue ASA  4. Generalized anxiety disorder 5. MDD (major depressive disorder), recurrent severe, without psychosis (HCC) Stable on Zoloft   6. History of shoulder dystocia in prior pregnancy, currently pregnant Will monitor EFW closely and counsel accordingly at a later date  Preterm labor symptoms and general obstetric precautions including but not limited to vaginal bleeding, contractions, leaking of fluid and fetal movement were reviewed in detail with the patient. Please refer to After Visit Summary for other counseling recommendations.   Return in about 4 weeks (around 07/25/2023) for in person, ROB, High risk.  Future Appointments  Date Time Provider Department Center  07/07/2023  1:00 PM [facility] PROVIDER 1 [facility]  07/07/2023  1:00 PM Arlyne Bering, NP [facility] None  07/07/2023  1:30 PM [facility] US3 [facility]    Verlyn Goad, MD

## 2023-06-29 ENCOUNTER — Encounter: Payer: Self-pay | Admitting: *Deleted

## 2023-07-07 ENCOUNTER — Other Ambulatory Visit: Payer: Self-pay | Admitting: *Deleted

## 2023-07-07 ENCOUNTER — Encounter (HOSPITAL_COMMUNITY): Payer: MEDICAID | Admitting: Psychiatry

## 2023-07-07 ENCOUNTER — Ambulatory Visit: Payer: MEDICAID | Admitting: Obstetrics

## 2023-07-07 ENCOUNTER — Ambulatory Visit: Payer: MEDICAID | Attending: Obstetrics and Gynecology

## 2023-07-07 VITALS — BP 112/64 | HR 85

## 2023-07-07 DIAGNOSIS — Z3A19 19 weeks gestation of pregnancy: Secondary | ICD-10-CM

## 2023-07-07 DIAGNOSIS — O358XX0 Maternal care for other (suspected) fetal abnormality and damage, not applicable or unspecified: Secondary | ICD-10-CM

## 2023-07-07 DIAGNOSIS — F1910 Other psychoactive substance abuse, uncomplicated: Secondary | ICD-10-CM | POA: Insufficient documentation

## 2023-07-07 DIAGNOSIS — O99212 Obesity complicating pregnancy, second trimester: Secondary | ICD-10-CM

## 2023-07-07 DIAGNOSIS — O09292 Supervision of pregnancy with other poor reproductive or obstetric history, second trimester: Secondary | ICD-10-CM

## 2023-07-07 DIAGNOSIS — O402XX0 Polyhydramnios, second trimester, not applicable or unspecified: Secondary | ICD-10-CM

## 2023-07-07 DIAGNOSIS — O0990 Supervision of high risk pregnancy, unspecified, unspecified trimester: Secondary | ICD-10-CM | POA: Insufficient documentation

## 2023-07-07 DIAGNOSIS — O99322 Drug use complicating pregnancy, second trimester: Secondary | ICD-10-CM | POA: Diagnosis not present

## 2023-07-07 DIAGNOSIS — E669 Obesity, unspecified: Secondary | ICD-10-CM | POA: Diagnosis not present

## 2023-07-07 DIAGNOSIS — Z362 Encounter for other antenatal screening follow-up: Secondary | ICD-10-CM

## 2023-07-07 DIAGNOSIS — F1920 Other psychoactive substance dependence, uncomplicated: Secondary | ICD-10-CM

## 2023-07-07 NOTE — Progress Notes (Signed)
 MFM Consult Note  [name] is currently at 19 weeks and 2 days.  She was seen for a detailed fetal anatomy scan.  She denies any significant past medical history and denies any problems in her current pregnancy.    She had a cell free DNA test earlier in her pregnancy which indicated a low risk for trisomy 49, 2, and 13. A female fetus is predicted.   She was informed that the fetal growth was appropriate for her gestational age.  Mild polyhydramnios with a maximal vertical pocket of 8 cm was noted today.  There were no obvious fetal anomalies noted on today's ultrasound exam.  However, today's exam was limited due to the fetal position.  The patient was informed that anomalies may be missed due to technical limitations. If the fetus is in a suboptimal position or maternal habitus is increased, visualization of the fetus in the maternal uterus may be impaired.  A follow-up exam was scheduled in 5 weeks to complete the views of the fetal anatomy and to assess the fetal growth.    The patient inquired about the possibility of paternity testing while she is pregnant.  She was advised that paternity testing during pregnancy would involve an amniocentesis and she would need a sample of the potential fathers blood or cells for analysis.    She was advised to get the paternity testing after the delivery of her baby.    Due to mild polyhydramnios noted today, she should be screened for gestational diabetes at her next prenatal visit.  The patient stated that all of her questions were answered today.  A total of 30 minutes was spent counseling and coordinating the care for this patient.  Greater than 50% of the time was spent in direct face-to-face contact.

## 2023-07-11 ENCOUNTER — Other Ambulatory Visit (HOSPITAL_COMMUNITY): Payer: MEDICAID

## 2023-07-25 ENCOUNTER — Encounter: Payer: MEDICAID | Admitting: Obstetrics and Gynecology

## 2023-08-03 ENCOUNTER — Other Ambulatory Visit (HOSPITAL_COMMUNITY): Payer: MEDICAID

## 2023-08-05 ENCOUNTER — Other Ambulatory Visit (HOSPITAL_COMMUNITY): Payer: MEDICAID

## 2023-08-11 ENCOUNTER — Ambulatory Visit (HOSPITAL_BASED_OUTPATIENT_CLINIC_OR_DEPARTMENT_OTHER): Payer: MEDICAID | Admitting: Obstetrics

## 2023-08-11 ENCOUNTER — Ambulatory Visit: Payer: MEDICAID | Attending: Obstetrics

## 2023-08-11 ENCOUNTER — Other Ambulatory Visit: Payer: Self-pay | Admitting: *Deleted

## 2023-08-11 VITALS — BP 114/70

## 2023-08-11 DIAGNOSIS — Z3A24 24 weeks gestation of pregnancy: Secondary | ICD-10-CM | POA: Diagnosis present

## 2023-08-11 DIAGNOSIS — F1920 Other psychoactive substance dependence, uncomplicated: Secondary | ICD-10-CM | POA: Diagnosis not present

## 2023-08-11 DIAGNOSIS — O402XX0 Polyhydramnios, second trimester, not applicable or unspecified: Secondary | ICD-10-CM

## 2023-08-11 DIAGNOSIS — O358XX0 Maternal care for other (suspected) fetal abnormality and damage, not applicable or unspecified: Secondary | ICD-10-CM | POA: Diagnosis not present

## 2023-08-11 DIAGNOSIS — O99212 Obesity complicating pregnancy, second trimester: Secondary | ICD-10-CM

## 2023-08-11 DIAGNOSIS — Z8759 Personal history of other complications of pregnancy, childbirth and the puerperium: Secondary | ICD-10-CM

## 2023-08-11 DIAGNOSIS — O99322 Drug use complicating pregnancy, second trimester: Secondary | ICD-10-CM

## 2023-08-11 DIAGNOSIS — F17200 Nicotine dependence, unspecified, uncomplicated: Secondary | ICD-10-CM

## 2023-08-11 DIAGNOSIS — O409XX0 Polyhydramnios, unspecified trimester, not applicable or unspecified: Secondary | ICD-10-CM

## 2023-08-11 DIAGNOSIS — E669 Obesity, unspecified: Secondary | ICD-10-CM

## 2023-08-11 DIAGNOSIS — O0990 Supervision of high risk pregnancy, unspecified, unspecified trimester: Secondary | ICD-10-CM | POA: Diagnosis present

## 2023-08-11 DIAGNOSIS — O09292 Supervision of pregnancy with other poor reproductive or obstetric history, second trimester: Secondary | ICD-10-CM | POA: Diagnosis not present

## 2023-08-11 DIAGNOSIS — Z362 Encounter for other antenatal screening follow-up: Secondary | ICD-10-CM | POA: Insufficient documentation

## 2023-08-11 NOTE — Progress Notes (Signed)
 MFM Consult Note  [name] is currently at 24 weeks and 2 days.  She has been followed due to polyhydramnios noted on her prior exam.  She denies any problems since her last exam.  On today's exam, the overall EFW of 1 pound 8 ounces measures at the 39th percentile for her gestational age.  Borderline polyhydramnios with a maximum vertical pocket of 7.28 cm continues to be noted today.    The views of the fetal anatomy were completed today.  There were no obvious fetal anomalies noted.  The limitations of ultrasound in the detection of all anomalies was discussed.  Due to borderline polyhydramnios noted today, she should be screened for gestational diabetes at her next prenatal visit.  A follow-up exam was scheduled in 6 weeks.  The patient stated that all of her questions were answered today.  A total of 20 minutes was spent counseling and coordinating the care for this patient.  Greater than 50% of the time was spent in direct face-to-face contact.

## 2023-08-15 ENCOUNTER — Encounter: Payer: MEDICAID | Admitting: Advanced Practice Midwife

## 2023-08-17 ENCOUNTER — Ambulatory Visit (INDEPENDENT_AMBULATORY_CARE_PROVIDER_SITE_OTHER): Payer: MEDICAID | Admitting: Physician Assistant

## 2023-08-17 ENCOUNTER — Encounter: Payer: Self-pay | Admitting: Physician Assistant

## 2023-08-17 VITALS — BP 128/72 | HR 100 | Wt 216.0 lb

## 2023-08-17 DIAGNOSIS — O09892 Supervision of other high risk pregnancies, second trimester: Secondary | ICD-10-CM | POA: Diagnosis not present

## 2023-08-17 DIAGNOSIS — O99342 Other mental disorders complicating pregnancy, second trimester: Secondary | ICD-10-CM | POA: Diagnosis not present

## 2023-08-17 DIAGNOSIS — O0990 Supervision of high risk pregnancy, unspecified, unspecified trimester: Secondary | ICD-10-CM

## 2023-08-17 DIAGNOSIS — Z3A25 25 weeks gestation of pregnancy: Secondary | ICD-10-CM | POA: Diagnosis not present

## 2023-08-17 DIAGNOSIS — F411 Generalized anxiety disorder: Secondary | ICD-10-CM

## 2023-08-17 DIAGNOSIS — Z8759 Personal history of other complications of pregnancy, childbirth and the puerperium: Secondary | ICD-10-CM

## 2023-08-17 DIAGNOSIS — O09299 Supervision of pregnancy with other poor reproductive or obstetric history, unspecified trimester: Secondary | ICD-10-CM

## 2023-08-17 DIAGNOSIS — F332 Major depressive disorder, recurrent severe without psychotic features: Secondary | ICD-10-CM

## 2023-08-17 NOTE — Progress Notes (Signed)
   PRENATAL VISIT NOTE  Subjective:  [name] is a 23 y.o. G3P1011 at [redacted]w[redacted]d being seen today for ongoing prenatal care.  She is currently monitored for the following issues for this high-risk pregnancy and has Suicidal ideation; Borderline personality disorder (HCC); Generalized anxiety disorder; HSV-2 infection; Polysubstance abuse (HCC); Hx of suicide attempt; PTSD (post-traumatic stress disorder); 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 disorder; Tobacco use disorder; Homelessness; Family history of breast cancer; Pap smear of cervix shows high risk HPV present on 05/30/23; Substance induced mood disorder (HCC); Morbid obesity (HCC); Attention and concentration deficit; Panic disorder; Cannabis use disorder; Stimulant use disorder; Opioid use disorder; MDD (major depressive disorder), recurrent severe, without psychosis (HCC); Obesity (BMI 30-39.9); Alcohol use disorder; Need for community resource; Bipolar 1 disorder, mixed, severe (HCC); Supervision of high risk pregnancy, antepartum; Food insecurity; History of gestational hypertension; and History of shoulder dystocia in prior pregnancy, currently pregnant on their problem list.  Patient reports no complaints.  Contractions: Not present. Vag. Bleeding: None.  Movement: Present. Denies leaking of fluid.   The following portions of the patient's history were reviewed and updated as appropriate: allergies, current medications, past family history, past medical history, past social history, past surgical history and problem list.   Objective:    Vitals:   08/17/23 1506  BP: 128/72  Pulse: 100  Weight: 216 lb (98 kg)    Fetal Status:  Fetal Heart Rate (bpm): 150 Fundal Height: 26 cm Movement: Present    General: Alert, oriented and cooperative. Patient is in no acute distress.  Skin: Skin is warm and dry. No rash noted.   Cardiovascular: Normal heart rate noted  Respiratory: Normal respiratory effort, no problems with respiration noted  Abdomen:  Soft, gravid, appropriate for gestational age.  Pain/Pressure: Absent     Pelvic: Cervical exam deferred        Extremities: Normal range of motion.  Edema: None  Mental Status: Normal mood and affect. Normal behavior. Normal judgment and thought content.   Assessment and Plan:  Pregnancy: G3P1011 at [redacted]w[redacted]d  1. Supervision of high risk pregnancy, antepartum (Primary) Patient doing well, feeling regular fetal movement  BP, FHR, FH appropriate  2. [redacted] weeks gestation of pregnancy Anticipatory guidance about next visits/weeks of pregnancy given.   3. History of gestational hypertension Stable Continue ASA  4. Generalized anxiety disorder 5. Severe episode of recurrent major depressive disorder, without psychotic features (HCC) Stable on Zoloft    6. History of shoulder dystocia in prior pregnancy, currently pregnant 08/11/23 EFW 676 gm (39 %) with f/u growth in 6w  Preterm labor symptoms and general obstetric precautions including but not limited to vaginal bleeding, contractions, leaking of fluid and fetal movement were reviewed in detail with the patient.  Please refer to After Visit Summary for other counseling recommendations.   Return in about 3 weeks (around 09/07/2023).  Future Appointments  Date Time Provider Department Center  09/22/2023  2:45 PM [facility] PROVIDER 1 [facility]  09/22/2023  3:00 PM [facility] US3 [facility]    Jorene FORBES Moats, PA-C

## 2023-08-17 NOTE — Progress Notes (Signed)
 Pt presents for ROB visit. No concerns

## 2023-08-24 ENCOUNTER — Emergency Department (HOSPITAL_COMMUNITY)
Admission: EM | Admit: 2023-08-24 | Discharge: 2023-08-24 | Disposition: A | Discharge status: Home / Self Care | Payer: MEDICAID | Attending: Emergency Medicine | Admitting: Emergency Medicine

## 2023-08-24 ENCOUNTER — Other Ambulatory Visit: Payer: Self-pay

## 2023-08-24 DIAGNOSIS — Z7982 Long term (current) use of aspirin: Secondary | ICD-10-CM | POA: Diagnosis not present

## 2023-08-24 DIAGNOSIS — J45909 Unspecified asthma, uncomplicated: Secondary | ICD-10-CM | POA: Diagnosis not present

## 2023-08-24 DIAGNOSIS — O99891 Other specified diseases and conditions complicating pregnancy: Secondary | ICD-10-CM | POA: Diagnosis present

## 2023-08-24 DIAGNOSIS — R42 Dizziness and giddiness: Secondary | ICD-10-CM | POA: Diagnosis not present

## 2023-08-24 LAB — CBC WITH DIFFERENTIAL/PLATELET
Abs Immature Granulocytes: 0.02 K/uL (ref 0.00–0.07)
Basophils Absolute: 0 K/uL (ref 0.0–0.1)
Basophils Relative: 0 %
Eosinophils Absolute: 0.1 K/uL (ref 0.0–0.5)
Eosinophils Relative: 2 %
HCT: 33.4 % — ABNORMAL LOW (ref 36.0–46.0)
Hemoglobin: 11.5 g/dL — ABNORMAL LOW (ref 12.0–15.0)
Immature Granulocytes: 0 %
Lymphocytes Relative: 20 %
Lymphs Abs: 1.4 K/uL (ref 0.7–4.0)
MCH: 33.4 pg (ref 26.0–34.0)
MCHC: 34.4 g/dL (ref 30.0–36.0)
MCV: 97.1 fL (ref 80.0–100.0)
Monocytes Absolute: 0.5 K/uL (ref 0.1–1.0)
Monocytes Relative: 7 %
Neutro Abs: 5 K/uL (ref 1.7–7.7)
Neutrophils Relative %: 71 %
Platelets: 193 K/uL (ref 150–400)
RBC: 3.44 MIL/uL — ABNORMAL LOW (ref 3.87–5.11)
RDW: 13.1 % (ref 11.5–15.5)
WBC: 7 K/uL (ref 4.0–10.5)
nRBC: 0 % (ref 0.0–0.2)

## 2023-08-24 LAB — URINALYSIS, ROUTINE W REFLEX MICROSCOPIC
Bilirubin Urine: NEGATIVE
Glucose, UA: NEGATIVE mg/dL
Hgb urine dipstick: NEGATIVE
Ketones, ur: 5 mg/dL — AB
Nitrite: NEGATIVE
Protein, ur: NEGATIVE mg/dL
Specific Gravity, Urine: 1.016 (ref 1.005–1.030)
pH: 7 (ref 5.0–8.0)

## 2023-08-24 LAB — COMPREHENSIVE METABOLIC PANEL WITH GFR
ALT: 13 U/L (ref 0–44)
AST: 17 U/L (ref 15–41)
Albumin: 2.9 g/dL — ABNORMAL LOW (ref 3.5–5.0)
Alkaline Phosphatase: 50 U/L (ref 38–126)
Anion gap: 7 (ref 5–15)
BUN: 6 mg/dL (ref 6–20)
CO2: 21 mmol/L — ABNORMAL LOW (ref 22–32)
Calcium: 8.5 mg/dL — ABNORMAL LOW (ref 8.9–10.3)
Chloride: 107 mmol/L (ref 98–111)
Creatinine, Ser: 0.43 mg/dL — ABNORMAL LOW (ref 0.44–1.00)
GFR, Estimated: >60 mL/min (ref >60)
Glucose, Bld: 83 mg/dL (ref 70–99)
Potassium: 3.8 mmol/L (ref 3.5–5.1)
Sodium: 135 mmol/L (ref 135–145)
Total Bilirubin: 0.4 mg/dL (ref 0.0–1.2)
Total Protein: 6.5 g/dL (ref 6.5–8.1)

## 2023-08-24 LAB — RESP PANEL BY RT-PCR (RSV, FLU A&B, COVID)  RVPGX2
Influenza A by PCR: NEGATIVE
Influenza B by PCR: NEGATIVE
Resp Syncytial Virus by PCR: NEGATIVE
SARS Coronavirus 2 by RT PCR: NEGATIVE

## 2023-08-24 MED ORDER — LACTATED RINGERS IV BOLUS
2000.0000 mL | Freq: Once | INTRAVENOUS | Status: AC
  Administered 2023-08-24: 2000 mL via INTRAVENOUS

## 2023-08-24 MED ORDER — LACTATED RINGERS IV SOLN: INTRAVENOUS | Status: DC

## 2023-08-24 NOTE — Progress Notes (Signed)
 [facility] called to come and assess pt who is G3P1 at 26.[redacted]wks pregnant.  She presents to the [facility] ED complaining of dizziness and blurred vision that began 3 or 4 days ago.  She reports that this has worsened over the past 2 days.  She also complains of a cough and mild sore throat that began around the same time.  She reports slightly decreased fetal movement today although audible fetal movement is noted by RN.  She denies LOF, vaginal bleeing, and UCS.  ED to give a liter of fluid and run CBC and BMET.

## 2023-08-24 NOTE — ED Notes (Signed)
 RN contacted rapid OB s/w [place]

## 2023-08-24 NOTE — ED Triage Notes (Signed)
 Patient to ED by POV with c/o cough and dizziness with blurred vision x3-4 days. States anytime she stands or move she becomes dizzy. She called after hours OBGYN and was told to come to ED. She voices sharp pains in ABD area.

## 2023-08-24 NOTE — ED Provider Notes (Signed)
 [place] EMERGENCY DEPARTMENT AT [facility] Provider Note   CSN: [id number] Arrival date & time: 08/24/23  1001     Patient presents with: Cough and Dizziness   [name] is a 23 y.o. female.   23 year old female is currently [redacted] weeks pregnant presents with dizziness and cough.  States that symptoms have been going on for past 3 to 4 days.  Does have history of asthma.  Denies any fever or chills.  States when she stands up for prolonged periods of time, she becomes lightheaded and her vision gets very blurry.  Also occurs when she is not walking for a prolonged period of time.  No focal neurological weakness.  Patient has not had any vaginal bleeding or abdominal cramping.  Has felt her baby move.  Called her doctor and was told to come here       Prior to Admission medications   Medication Sig Start Date End Date Taking? Authorizing Provider  albuterol  (VENTOLIN  HFA) 108 (90 Base) MCG/ACT inhaler Inhale 1-2 puffs into the lungs every 6 (six) hours as needed for wheezing or shortness of breath. Patient not taking: Reported on 08/17/2023    [provider]  aspirin  EC 81 MG tablet Take 1 tablet (81 mg total) by mouth daily. Take after 12 weeks for prevention of preeclampsia later in pregnancy 05/30/23   Constant, Peggy, MD  Blood Pressure Monitoring (BLOOD PRESSURE KIT) DEVI 1 Device by Does not apply route once a week. Patient not taking: Reported on 08/17/2023 05/16/23   Constant, Peggy, MD  busPIRone  (BUSPAR ) 10 MG tablet Take 1 tablet (10 mg total) by mouth 3 (three) times daily. Patient not taking: Reported on 05/16/2023 03/25/23   Izella Ismael NOVAK, MD  diphenhydrAMINE  HCl, Sleep, (UNISOM  SLEEPMELTS) 25 MG TBDP Take 1-2 tablets (25-50 mg total) by mouth at bedtime as needed. Patient not taking: Reported on 08/17/2023 04/27/23   Harl Zane BRAVO, NP  hydrOXYzine  (ATARAX ) 50 MG tablet Take 1 tablet (50 mg total) by mouth at bedtime. Patient not taking: Reported on  05/16/2023 05/05/23   Harl Zane BRAVO, NP  loratadine  (CLARITIN ) 10 MG tablet Take 1 tablet (10 mg total) by mouth daily. Patient not taking: Reported on 08/17/2023 11/08/22   Leigh Corean Massa, MD  melatonin 5 MG TABS Take 1 tablet (5 mg total) by mouth at bedtime. Patient not taking: Reported on 05/16/2023 03/25/23   Izella Ismael NOVAK, MD  pantoprazole  (PROTONIX ) 40 MG tablet Take 1 tablet (40 mg total) by mouth daily. 05/30/23   Constant, Peggy, MD  Prenatal Vit-Fe Phos-FA-Omega (VITAFOL  GUMMIES) 3.33-0.333-34.8 MG CHEW Chew 1 tablet by mouth daily. 05/16/23   Constant, Peggy, MD  promethazine  (PHENERGAN ) 25 MG tablet Take 1 tablet (25 mg total) by mouth every 6 (six) hours as needed for nausea or vomiting. 05/30/23   Constant, Peggy, MD  sertraline  (ZOLOFT ) 50 MG tablet Take 1 tablet (50 mg total) by mouth daily. 05/30/23   Constant, Peggy, MD  vitamin D3 (CHOLECALCIFEROL ) 25 MCG tablet Take 1 tablet (1,000 Units total) by mouth daily. 03/26/23   Izella Ismael NOVAK, MD    Allergies: Tape    Review of Systems  All other systems reviewed and are negative.   Updated Vital Signs BP 124/81 (BP Location: Left Arm)   Pulse 78   Temp 98.1 F (36.7 C) (Oral)   Resp 16   Ht 1.753 m (5' 9)   Wt 98 kg   LMP 03/12/2023 (Approximate)  SpO2 100%   BMI 31.90 kg/m   Physical Exam Vitals and nursing note reviewed.  Constitutional:      General: She is not in acute distress.    Appearance: Normal appearance. She is well-developed. She is not toxic-appearing.  HENT:     Head: Normocephalic and atraumatic.  Eyes:     General: Lids are normal.     Conjunctiva/sclera: Conjunctivae normal.     Pupils: Pupils are equal, round, and reactive to light.  Neck:     Thyroid : No thyroid  mass.     Trachea: No tracheal deviation.  Cardiovascular:     Rate and Rhythm: Normal rate and regular rhythm.     Heart sounds: Normal heart sounds. No murmur heard.    No gallop.  Pulmonary:     Effort: Pulmonary  effort is normal. No respiratory distress.     Breath sounds: Normal breath sounds. No stridor. No decreased breath sounds, wheezing, rhonchi or rales.  Abdominal:     General: There is no distension.     Palpations: Abdomen is soft.     Tenderness: There is no abdominal tenderness. There is no rebound.  Musculoskeletal:        General: No tenderness. Normal range of motion.     Cervical back: Normal range of motion and neck supple.  Skin:    General: Skin is warm and dry.     Findings: No abrasion or rash.  Neurological:     Mental Status: She is alert and oriented to person, place, and time. Mental status is at baseline.     GCS: GCS eye subscore is 4. GCS verbal subscore is 5. GCS motor subscore is 6.     Cranial Nerves: No cranial nerve deficit.     Sensory: No sensory deficit.     Motor: Motor function is intact.  Psychiatric:        Attention and Perception: Attention normal.        Speech: Speech normal.        Behavior: Behavior normal.    (all labs ordered are listed, but only abnormal results are displayed) Labs Reviewed  URINALYSIS, ROUTINE W REFLEX MICROSCOPIC  CBC WITH DIFFERENTIAL/PLATELET  COMPREHENSIVE METABOLIC PANEL WITH GFR    EKG: EKG Interpretation Date/Time:  Wednesday August 24 2023 11:35:13 EDT Ventricular Rate:  75 PR Interval:  135 QRS Duration:  77 QT Interval:  382 QTC Calculation: 427 R Axis:   24  Text Interpretation: Sinus rhythm Low voltage, precordial leads Borderline T abnormalities, anterior leads No significant change since last tracing Confirmed by Dasie Faden ([id number]) on 08/24/2023 12:04:07 PM  Radiology: No results found.   Procedures   Medications Ordered in the ED  lactated ringers  bolus 2,000 mL (has no administration in time range)  lactated ringers  infusion (has no administration in time range)                                    Medical Decision Making Amount and/or Complexity of Data Reviewed Labs:  ordered. ECG/medicine tests: ordered.  Risk Prescription drug management.   Patient's EKG shows normal sinus rhythm.  Given 2 L of IV fluids here.  Urinalysis does show slight increase in ketones but no protein.  COVID and flu test negative.  Evaluated by rapid OB response nurse and no further OB needs needed for this patient.  Patient amatory without any difficulty.  Patient  hemoglobin is stable here.  Will discharge home    Final diagnoses:  None    ED Discharge Orders     None          Dasie Faden, MD 08/24/23 1424

## 2023-08-24 NOTE — Progress Notes (Signed)
 Dr Cleatus called and notified of pt and symptoms.  MD also informed of EDs plan of care.  She has no further orders and says that pt may be cleared obstetrically.

## 2023-09-07 ENCOUNTER — Encounter: Payer: MEDICAID | Admitting: Obstetrics and Gynecology

## 2023-09-12 ENCOUNTER — Other Ambulatory Visit: Payer: MEDICAID

## 2023-09-12 DIAGNOSIS — Z3A28 28 weeks gestation of pregnancy: Secondary | ICD-10-CM

## 2023-09-13 LAB — GLUCOSE TOLERANCE, 2 HOURS W/ 1HR
Glucose, 1 hour: 162 mg/dL (ref 70–179)
Glucose, 2 hour: 54 mg/dL — ABNORMAL LOW (ref 70–152)
Glucose, Fasting: 85 mg/dL (ref 70–91)

## 2023-09-13 LAB — CBC
Hematocrit: 35.5 % (ref 34.0–46.6)
Hemoglobin: 12 g/dL (ref 11.1–15.9)
MCH: 33.5 pg — ABNORMAL HIGH (ref 26.6–33.0)
MCHC: 33.8 g/dL (ref 31.5–35.7)
MCV: 99 fL — ABNORMAL HIGH (ref 79–97)
Platelets: 184 x10E3/uL (ref 150–450)
RBC: 3.58 x10E6/uL — ABNORMAL LOW (ref 3.77–5.28)
RDW: 12.6 % (ref 11.7–15.4)
WBC: 7.6 x10E3/uL (ref 3.4–10.8)

## 2023-09-13 LAB — RPR: RPR Ser Ql: NONREACTIVE

## 2023-09-13 LAB — HIV ANTIBODY (ROUTINE TESTING W REFLEX): HIV Screen 4th Generation wRfx: NONREACTIVE

## 2023-09-19 ENCOUNTER — Encounter: Payer: MEDICAID | Admitting: Obstetrics and Gynecology

## 2023-09-22 ENCOUNTER — Ambulatory Visit: Payer: MEDICAID

## 2023-09-22 DIAGNOSIS — O0990 Supervision of high risk pregnancy, unspecified, unspecified trimester: Secondary | ICD-10-CM

## 2023-09-27 ENCOUNTER — Ambulatory Visit: Payer: MEDICAID | Attending: Obstetrics and Gynecology | Admitting: Maternal & Fetal Medicine

## 2023-09-27 ENCOUNTER — Other Ambulatory Visit: Payer: Self-pay | Admitting: *Deleted

## 2023-09-27 ENCOUNTER — Ambulatory Visit (HOSPITAL_BASED_OUTPATIENT_CLINIC_OR_DEPARTMENT_OTHER): Payer: MEDICAID

## 2023-09-27 DIAGNOSIS — F1920 Other psychoactive substance dependence, uncomplicated: Secondary | ICD-10-CM

## 2023-09-27 DIAGNOSIS — O99323 Drug use complicating pregnancy, third trimester: Secondary | ICD-10-CM

## 2023-09-27 DIAGNOSIS — O409XX0 Polyhydramnios, unspecified trimester, not applicable or unspecified: Secondary | ICD-10-CM

## 2023-09-27 DIAGNOSIS — Z3A31 31 weeks gestation of pregnancy: Secondary | ICD-10-CM | POA: Diagnosis not present

## 2023-09-27 DIAGNOSIS — O358XX0 Maternal care for other (suspected) fetal abnormality and damage, not applicable or unspecified: Secondary | ICD-10-CM

## 2023-09-27 DIAGNOSIS — O09293 Supervision of pregnancy with other poor reproductive or obstetric history, third trimester: Secondary | ICD-10-CM

## 2023-09-27 DIAGNOSIS — O99322 Drug use complicating pregnancy, second trimester: Secondary | ICD-10-CM | POA: Insufficient documentation

## 2023-09-27 NOTE — Progress Notes (Signed)
 Patient information  Patient Name: [name]  Patient MRN:   [id number]  Referring practice: MFM Referring Provider: [facility]  Problem List   Patient Active Problem List   Diagnosis Date Noted   Food insecurity 05/30/2023   History of gestational hypertension 05/30/2023   History of shoulder dystocia in prior pregnancy, currently pregnant 05/30/2023   Supervision of high risk pregnancy, antepartum 05/16/2023   Bipolar 1 disorder, mixed, severe (HCC) 03/16/2023   Need for community resource 12/20/2022   Obesity (BMI 30-39.9) 11/04/2022   Alcohol use disorder 11/04/2022   MDD (major depressive disorder), recurrent severe, 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/2022   Panic disorder 08/14/2022   Cannabis use disorder 08/14/2022   Stimulant use disorder 08/14/2022   Opioid use disorder 08/14/2022   Attention and concentration deficit 08/20/2020   Morbid obesity (HCC) 03/25/2020   Substance induced mood disorder (HCC) 03/24/2020   Tobacco use disorder 03/13/2020   Homelessness 03/13/2020   Family history of breast cancer 03/13/2020   Pap smear of cervix shows high risk HPV present on 05/30/23 03/13/2020   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 G3P1011 at [redacted]w[redacted]d here for ultrasound and consultation. [name] is doing well today with no acute concerns. Today we focused on the following:   The patient reports that she has been very depressed lately.  She has not taken her Lamictal  or been to counseling.  I encouraged her to reestablish care and to consider taking her medicine again as prescribed.  The patient also voiced that she currently does not  have housing.  She is staying with friends but does not want to move around after the baby is born.  I discussed that I will have our nurse reach out to the social worker and have her call the patient to discuss her options.  Currently the patient does not have suicidal or homicidal ideation and feels safe in the homes that she is staying at.  I encouraged her to call with any concerns or go to [facility]U with anything urgent.   The patient reports that she has a history of substance abuse but has been clean except for occasional marijuana use.  We discussed the importance of avoiding illicit substances and to follow-up for growth ultrasound in 1 month.  The patient had time to ask questions that were answered to her satisfaction.  She verbalized understanding and agrees to proceed with the plan below.  Sonographic findings Single intrauterine pregnancy. Fetal cardiac activity: Observed. Presentation: Cephalic. Interval fetal anatomy appears normal. Fetal biometry shows the estimated fetal weight at the 36 percentile. Amniotic fluid: Within normal limits.  MVP: 5.84 cm. Placenta: Posterior.  There are limitations of prenatal ultrasound such as the inability to detect certain abnormalities due to poor visualization. Various factors such as fetal position, gestational age and maternal body habitus may increase the difficulty in visualizing the fetal anatomy.    Recommendations -Serial growth ultrasounds every 4-6 weeks until delivery. -I encouraged the patient to f/u with her mental health provider and to consider restarting her medications and return to counseling.  -Will have social work contact the patient for housing/mental health concerns.  Review of Systems: A review of systems was performed and was negative except per HPI   Vitals and Physical Exam    09/27/2023    2:44 PM 08/24/2023    2:00 PM 08/24/2023    1:30 PM  Vitals with BMI  Systolic 98 103 107  Diastolic 60 57 66  Pulse 109 73  67    Sitting comfortably on the sonogram table Nonlabored breathing Normal rate and rhythm Abdomen is nontender  Past pregnancies OB History  Gravida Para Term Preterm AB Living  3 1 1  0 1 1  SAB IAB Ectopic Multiple Live Births  1 0 0 0 1    # Outcome Date GA Lbr Len/2nd Weight Sex Type Anes PTL Lv  3 Current           2 SAB 2023          1 Term 06/16/16 [redacted]w[redacted]d   M Vag-Spont   LIV     I spent 30 minutes reviewing the patients chart, including labs and images as well as counseling the patient about her medical conditions. Greater than 50% of the time was spent in direct face-to-face patient counseling.  Delora Smaller  MFM, [facility]   09/27/2023  3:23 PM

## 2023-10-05 ENCOUNTER — Encounter: Payer: MEDICAID | Admitting: Student

## 2023-10-11 ENCOUNTER — Ambulatory Visit: Payer: MEDICAID | Admitting: Licensed Clinical Social Worker

## 2023-10-11 DIAGNOSIS — R69 Illness, unspecified: Secondary | ICD-10-CM

## 2023-10-11 NOTE — BH Specialist Note (Signed)
 Integrated Behavioral Health via Telemedicine Visit  10/17/2023 [name] [id number]  Number of Integrated Behavioral Health Clinician visits: 1- Initial Visit  Session Start time: 1445   Session End time: 1458  Total time in minutes: 13    Referring Provider: Dr. Alger Patient/Family location: At [facility] Provider location: Remote Office All persons participating in visit: Patient and [facility] Types of Service: Individual psychotherapy and Video visit  I connected with [name] and/or [name]'s patient via  Psychologist, clinical  (Video is Surveyor, mining) and verified that I am speaking with the correct person using two identifiers. Discussed confidentiality: Yes   I discussed the limitations of telemedicine and the availability of in person appointments.  Discussed there is a possibility of technology failure and discussed alternative modes of communication if that failure occurs.  I discussed that engaging in this telemedicine visit, they consent to the provision of behavioral healthcare and the services will be billed under their insurance.  Patient and/or legal guardian expressed understanding and consented to Telemedicine visit: Yes   Presenting Concerns: Patient and/or family reports the following symptoms/concerns: Depressive symptoms  Duration of problem: Years; Severity of problem: moderate  Patient and/or Family Response:Patient was present for today's session and reported that the purpose of the visit was to discuss her ongoing symptoms of depression. She shared that she has experienced depression throughout her life but feels her symptoms are currently well-managed. The patient acknowledged that she initially agreed to engage in behavioral health services to avoid being judged by others, but expressed that she does not believe services are necessary at this time. She stated that she is familiar with managing her symptoms  independently and does not feel the need to learn additional coping strategies. The patient agreed to follow up in the future if her needs change or additional support is desired.    I discussed the assessment and treatment plan with the patient and/or parent/guardian. They were provided an opportunity to ask questions and all were answered. They agreed with the plan and demonstrated an understanding of the instructions.   They were advised to call back or seek an in-person evaluation if the symptoms worsen or if the condition fails to improve as anticipated.  [name], LCSWA

## 2023-10-17 ENCOUNTER — Encounter: Payer: MEDICAID | Admitting: Obstetrics

## 2023-10-25 ENCOUNTER — Ambulatory Visit: Payer: MEDICAID | Attending: Obstetrics and Gynecology

## 2023-10-25 ENCOUNTER — Other Ambulatory Visit: Payer: Self-pay | Admitting: *Deleted

## 2023-10-25 ENCOUNTER — Ambulatory Visit: Payer: MEDICAID | Admitting: Obstetrics and Gynecology

## 2023-10-25 VITALS — BP 123/75

## 2023-10-25 DIAGNOSIS — F1920 Other psychoactive substance dependence, uncomplicated: Secondary | ICD-10-CM | POA: Diagnosis not present

## 2023-10-25 DIAGNOSIS — O35EXX0 Maternal care for other (suspected) fetal abnormality and damage, fetal genitourinary anomalies, not applicable or unspecified: Secondary | ICD-10-CM | POA: Insufficient documentation

## 2023-10-25 DIAGNOSIS — O358XX0 Maternal care for other (suspected) fetal abnormality and damage, not applicable or unspecified: Secondary | ICD-10-CM | POA: Diagnosis present

## 2023-10-25 DIAGNOSIS — O09293 Supervision of pregnancy with other poor reproductive or obstetric history, third trimester: Secondary | ICD-10-CM | POA: Diagnosis not present

## 2023-10-25 DIAGNOSIS — O99323 Drug use complicating pregnancy, third trimester: Secondary | ICD-10-CM | POA: Diagnosis not present

## 2023-10-25 DIAGNOSIS — Z3A35 35 weeks gestation of pregnancy: Secondary | ICD-10-CM | POA: Diagnosis present

## 2023-10-25 DIAGNOSIS — O0990 Supervision of high risk pregnancy, unspecified, unspecified trimester: Secondary | ICD-10-CM

## 2023-10-25 DIAGNOSIS — O283 Abnormal ultrasonic finding on antenatal screening of mother: Secondary | ICD-10-CM

## 2023-10-25 NOTE — Progress Notes (Signed)
 Maternal-Fetal Medicine Consultation  Name: [name]  MRN: [id number]  GA: [id number] [redacted]w[redacted]d   Patient is here for fetal growth assessment.  History of substance use. She does not have gestational diabetes. Obstetric history is significant for a term vaginal delivery in 2018 of a female infant.  Her delivery was complicated by shoulder dystocia that was corrected by McRoberts maneuver.  The infant sustained no neurological injuries.  Ultrasound Normal fetal growth and amniotic fluid. 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 Right kidney appears normal.  I counseled the patient on the following: Left pelvic kidney I explained the diagnosis with the help of diagrams.  Pelvic kidney refers to an abnormally-located kidney. It is present in about 1 in 2,000 to 1 in 3,000 births the association with aneuploidy's and genetic syndromes is very low.  Pelvic kidney is usually hypoplastic can be associated with renal obstruction, hydronephrosis, renal calculi and postnatal surgery.  In the presence of normal-appearing contralateral kidney and normal amniotic fluid, no early delivery is indicated.  Vaginal delivery can be safely attempted.  Differential diagnosis include crossed renal ectopia.  Patient understands that only postnatal evaluation will confirm the diagnosis and ascertain the function of the left pelvic kidney.  I did not address history of shoulder dystocia today.  Recommendations - An appointment was made for her to return in 2 weeks for a limited scan and to evaluate the kidneys. - Patient to be counseled on history of shoulder dystocia.     Consultation including face-to-face (more than 50%) counseling 30 minutes.

## 2023-10-31 ENCOUNTER — Ambulatory Visit (INDEPENDENT_AMBULATORY_CARE_PROVIDER_SITE_OTHER): Payer: MEDICAID | Admitting: Obstetrics and Gynecology

## 2023-10-31 ENCOUNTER — Encounter: Payer: Self-pay | Admitting: Obstetrics and Gynecology

## 2023-10-31 ENCOUNTER — Other Ambulatory Visit (HOSPITAL_COMMUNITY)
Admission: RE | Admit: 2023-10-31 | Discharge: 2023-10-31 | Disposition: A | Discharge status: Home / Self Care | Payer: MEDICAID | Source: Ambulatory Visit | Attending: Obstetrics and Gynecology | Admitting: Obstetrics and Gynecology

## 2023-10-31 VITALS — BP 107/70 | HR 97 | Wt 231.0 lb

## 2023-10-31 DIAGNOSIS — O0990 Supervision of high risk pregnancy, unspecified, unspecified trimester: Secondary | ICD-10-CM | POA: Insufficient documentation

## 2023-10-31 DIAGNOSIS — O09299 Supervision of pregnancy with other poor reproductive or obstetric history, unspecified trimester: Secondary | ICD-10-CM

## 2023-10-31 DIAGNOSIS — Z8759 Personal history of other complications of pregnancy, childbirth and the puerperium: Secondary | ICD-10-CM

## 2023-10-31 DIAGNOSIS — Z3A36 36 weeks gestation of pregnancy: Secondary | ICD-10-CM | POA: Diagnosis not present

## 2023-10-31 DIAGNOSIS — F411 Generalized anxiety disorder: Secondary | ICD-10-CM

## 2023-10-31 NOTE — Progress Notes (Signed)
   PRENATAL VISIT NOTE  Subjective:  [name] is a 23 y.o. G3P1011 at [redacted]w[redacted]d being seen today for ongoing prenatal care.  She is currently monitored for the following issues for this high-risk pregnancy and has Suicidal ideation; Borderline personality disorder (HCC); Generalized anxiety disorder; HSV-2 infection; Polysubstance abuse (HCC); Hx of suicide attempt; PTSD (post-traumatic stress disorder); 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 disorder; Tobacco use disorder; Homelessness; Family history of breast cancer; Pap smear of cervix shows high risk HPV present on 05/30/23; Substance induced mood disorder (HCC); Morbid obesity (HCC); Attention and concentration deficit; Panic disorder; Cannabis use disorder; Stimulant use disorder; Opioid use disorder; MDD (major depressive disorder), recurrent severe, without psychosis (HCC); Obesity (BMI 30-39.9); Alcohol use disorder; Need for community resource; Bipolar 1 disorder, mixed, severe (HCC); Supervision of high risk pregnancy, antepartum; Food insecurity; History of gestational hypertension; and History of shoulder dystocia in prior pregnancy, currently pregnant on their problem list.  Patient reports no complaints.  Contractions: Not present. Vag. Bleeding: None.  Movement: Present. Denies leaking of fluid.   The following portions of the patient's history were reviewed and updated as appropriate: allergies, current medications, past family history, past medical history, past social history, past surgical history and problem list.   Objective:    Vitals:   10/31/23 1447  BP: 107/70  Pulse: 97  Weight: 231 lb (104.8 kg)    Fetal Status:      Movement: Present    General: Alert, oriented and cooperative. Patient is in no acute distress.  Skin: Skin is warm and dry. No rash noted.   Cardiovascular: Normal heart rate noted  Respiratory: Normal respiratory effort, no problems with respiration noted  Abdomen: Soft, gravid, appropriate for gestational  age.  Pain/Pressure: Present     Pelvic: Cervical exam performed in the presence of a chaperone        Extremities: Normal range of motion.  Edema: None  Mental Status: Normal mood and affect. Normal behavior. Normal judgment and thought content.   Assessment and Plan:  Pregnancy: G3P1011 at [redacted]w[redacted]d 1. Supervision of high risk pregnancy, antepartum (Primary) Patient is doing well without complaints Vaginal cultures collected today Follow up ultrasound on 11/08/23 for pelvic kidney Patient plans BTL- consent form signed 10/6 Patient is still looking for housing- list provided  2. [redacted] weeks gestation of pregnancy   3. History of shoulder dystocia in prior pregnancy, currently pregnant Patient counseled on option for primary cesarean section given her history Patient plans for attempt at vaginal delivery  4. History of gestational hypertension BP stable  5. Generalized anxiety disorder Patient discontinued zoloft  and is without complaints  Preterm labor symptoms and general obstetric precautions including but not limited to vaginal bleeding, contractions, leaking of fluid and fetal movement were reviewed in detail with the patient. Please refer to After Visit Summary for other counseling recommendations.   Return in about 1 week (around 11/07/2023) for in person, ROB, Low risk.  Future Appointments  Date Time Provider Department Center  11/08/2023  1:15 PM [facility] PROVIDER 1 [facility]  11/08/2023  1:30 PM [facility] US2 [facility]  11/14/2023  2:30 PM Zina Jerilynn LABOR, MD [facility] None    Winton Felt, MD

## 2023-11-01 LAB — CERVICOVAGINAL ANCILLARY ONLY
Chlamydia: NEGATIVE
Comment: NEGATIVE
Comment: NORMAL
Neisseria Gonorrhea: NEGATIVE

## 2023-11-03 LAB — CULTURE, BETA STREP (GROUP B ONLY): Strep Gp B Culture: POSITIVE — AB

## 2023-11-04 ENCOUNTER — Encounter: Payer: Self-pay | Admitting: Obstetrics and Gynecology

## 2023-11-04 ENCOUNTER — Other Ambulatory Visit: Payer: Self-pay | Admitting: Medical Genetics

## 2023-11-04 DIAGNOSIS — O99820 Streptococcus B carrier state complicating pregnancy: Secondary | ICD-10-CM | POA: Insufficient documentation

## 2023-11-04 DIAGNOSIS — Z006 Encounter for examination for normal comparison and control in clinical research program: Secondary | ICD-10-CM

## 2023-11-08 ENCOUNTER — Ambulatory Visit: Payer: MEDICAID | Attending: Obstetrics and Gynecology

## 2023-11-08 ENCOUNTER — Ambulatory Visit: Payer: MEDICAID | Admitting: Maternal & Fetal Medicine

## 2023-11-08 VITALS — BP 111/67

## 2023-11-08 DIAGNOSIS — O359XX0 Maternal care for (suspected) fetal abnormality and damage, unspecified, not applicable or unspecified: Secondary | ICD-10-CM | POA: Insufficient documentation

## 2023-11-08 DIAGNOSIS — O99323 Drug use complicating pregnancy, third trimester: Secondary | ICD-10-CM | POA: Diagnosis not present

## 2023-11-08 DIAGNOSIS — O35EXX0 Maternal care for other (suspected) fetal abnormality and damage, fetal genitourinary anomalies, not applicable or unspecified: Secondary | ICD-10-CM | POA: Diagnosis not present

## 2023-11-08 DIAGNOSIS — O283 Abnormal ultrasonic finding on antenatal screening of mother: Secondary | ICD-10-CM | POA: Diagnosis present

## 2023-11-08 DIAGNOSIS — O09293 Supervision of pregnancy with other poor reproductive or obstetric history, third trimester: Secondary | ICD-10-CM | POA: Diagnosis not present

## 2023-11-08 DIAGNOSIS — O0990 Supervision of high risk pregnancy, unspecified, unspecified trimester: Secondary | ICD-10-CM | POA: Insufficient documentation

## 2023-11-08 DIAGNOSIS — Z3A37 37 weeks gestation of pregnancy: Secondary | ICD-10-CM | POA: Diagnosis present

## 2023-11-08 DIAGNOSIS — F1920 Other psychoactive substance dependence, uncomplicated: Secondary | ICD-10-CM | POA: Insufficient documentation

## 2023-11-08 NOTE — Progress Notes (Signed)
 Patient information  Patient Name: [name]  Patient MRN:   [id number]  Referring practice: [facility] Referring Provider: [facility]  Problem List   Patient Active Problem List   Diagnosis Date Noted   Known fetal anomaly, antepartum (left pelvic kidney) 11/08/2023   GBS (group B Streptococcus carrier), +RV culture, currently pregnant 11/04/2023   Food insecurity 05/30/2023   History of gestational hypertension 05/30/2023   History of shoulder dystocia in prior pregnancy, currently pregnant 05/30/2023   Supervision of high risk pregnancy, antepartum 05/16/2023   Bipolar 1 disorder, mixed, severe (HCC) 03/16/2023   Need for community resource 12/20/2022   Obesity (BMI 30-39.9) 11/04/2022   Alcohol use disorder 11/04/2022   MDD (major depressive disorder), recurrent severe, 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/2022   Panic disorder 08/14/2022   Cannabis use disorder 08/14/2022   Stimulant use disorder 08/14/2022   Opioid use disorder 08/14/2022   Attention and concentration deficit 08/20/2020   Morbid obesity (HCC) 03/25/2020   Substance induced mood disorder (HCC) 03/24/2020   Tobacco use disorder 03/13/2020   Homelessness 03/13/2020   Family history of breast cancer 03/13/2020   Pap smear of cervix shows high risk HPV present on 05/30/23 03/13/2020   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 disorder 01/24/2020   Polysubstance abuse (HCC) 06/08/2017   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 G3P1011 at [redacted]w[redacted]d here for ultrasound and consultation. [name] is doing well today with no acute concerns. 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  This kidney is located within the  pelvis.  There is no evidence of urinary tract dilation or fusion of the renal poles.  The right kidney is located within the renal fossa.  I discussed the clinical significance of potential complications associated with this condition include but not limited to a decrease in kidney function over time, hypertension, urinary tract dilation and increased risk of urinary tract infection.  Renal ectopia refers to any kidney that is not located within the normal location known as the renal fossa. Most commonly these are pelvic kidneys but may also be located elsewhere.  A pelvic kidney is present in about 1 in 2,000 to 1 in 3,000 births with a slight female predominance.  It can be associated with other anomalies but is most commonly isolated.  It is not typically associated with genetic disorders but can be a part of various syndromes when there are other anomalies present. I reassured the patient that rest of the fetal anatomy appears normal. A pelvic kidney can often become hypoplastic and can be associated with renal obstruction and hydronephrosis, renal calculi and postnatal surgery. Each type of renal ectopia is associated with an increased prevalence of VUR and UPJ obstruction. I reassured her that in the presence of normal-functioning contralateral kidney, no early delivery is indicated. I also informed her that vaginal delivery is preferred unless there are other obstetric indications for cesarean delivery.    Postnatally US  is performed in the neonatal period to confirm the diagnosis and evaluate for evidence of obstruction. Given the high incidence of VUR, voiding cystourethrogram may be considered. Renal ectopia generally is associated with a benign clinical course. UPJ obstruction is  the most common indication for surgical intervention.   The patient had time to ask questions that were answered to her satisfaction.  She verbalized understanding and agrees to proceed with the plan below.  Sonographic  findings Single intrauterine pregnancy at 37w 0d.  Fetal cardiac activity:  Observed and appears normal. Presentation: Cephalic. Interval fetal anatomy appears normal except for a left pelvic kidney. Fetal biometry shows the estimated fetal weight at the  percentile. Amniotic fluid volume: Within normal limits. MVP: 4.67 cm. Placenta: Posterior.  Recommendation  - Post delivery renal ultrasound with pediatric urology consultation as needed  Review of Systems: A review of systems was performed and was negative except per HPI   Vitals and Physical Exam    11/08/2023    1:32 PM 10/31/2023    2:47 PM 10/25/2023    2:32 PM  Vitals with BMI  Weight  231 lbs   Systolic 111 107 876  Diastolic 67 70 75  Pulse  97     Sitting comfortably on the sonogram table Nonlabored breathing Normal rate and rhythm Abdomen is nontender  Past pregnancies OB History  Gravida Para Term Preterm AB Living  3 1 1  0 1 1  SAB IAB Ectopic Multiple Live Births  1 0 0 0 1    # Outcome Date GA Lbr Len/2nd Weight Sex Type Anes PTL Lv  3 Current           2 SAB 2023          1 Term 06/16/16 [redacted]w[redacted]d   M Vag-Spont   LIV     I spent 30 minutes reviewing the patients chart, including labs and images as well as counseling the patient about her medical conditions. Greater than 50% of the time was spent in direct face-to-face patient counseling.  Delora Smaller  MFM, [place]   11/08/2023  2:35 PM

## 2023-11-09 ENCOUNTER — Ambulatory Visit (INDEPENDENT_AMBULATORY_CARE_PROVIDER_SITE_OTHER): Payer: MEDICAID | Admitting: Obstetrics & Gynecology

## 2023-11-09 VITALS — BP 104/72 | HR 108 | Wt 231.0 lb

## 2023-11-09 DIAGNOSIS — O99820 Streptococcus B carrier state complicating pregnancy: Secondary | ICD-10-CM

## 2023-11-09 DIAGNOSIS — O0990 Supervision of high risk pregnancy, unspecified, unspecified trimester: Secondary | ICD-10-CM

## 2023-11-09 DIAGNOSIS — O359XX0 Maternal care for (suspected) fetal abnormality and damage, unspecified, not applicable or unspecified: Secondary | ICD-10-CM | POA: Diagnosis not present

## 2023-11-09 NOTE — Progress Notes (Signed)
   PRENATAL VISIT NOTE  Subjective:  [name] is a 23 y.o. G3P1011 at 108w1d being seen today for ongoing prenatal care.  She is currently monitored for the following issues for this high-risk pregnancy and has Suicidal ideation; Borderline personality disorder (HCC); Generalized anxiety disorder; HSV-2 infection; Polysubstance abuse (HCC); Hx of suicide attempt; PTSD (post-traumatic stress disorder); 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 disorder; Tobacco use disorder; Homelessness; Family history of breast cancer; Pap smear of cervix shows high risk HPV present on 05/30/23; Substance induced mood disorder (HCC); Morbid obesity (HCC); Attention and concentration deficit; Panic disorder; Cannabis use disorder; Stimulant use disorder; Opioid use disorder; MDD (major depressive disorder), recurrent severe, without psychosis (HCC); Obesity (BMI 30-39.9); Alcohol use disorder; Need for community resource; Bipolar 1 disorder, mixed, severe (HCC); Supervision of high risk pregnancy, antepartum; Food insecurity; History of gestational hypertension; History of shoulder dystocia in prior pregnancy, currently pregnant; GBS (group B Streptococcus carrier), +RV culture, currently pregnant; and Known fetal anomaly, antepartum (left pelvic kidney) on their problem list.  Patient reports no complaints.  Contractions: Not present. Vag. Bleeding: None.  Movement: Present. Denies leaking of fluid.   The following portions of the patient's history were reviewed and updated as appropriate: allergies, current medications, past family history, past medical history, past social history, past surgical history and problem list.   Objective:    Vitals:   11/09/23 1353  BP: 104/72  Pulse: (!) 108  Weight: 231 lb (104.8 kg)    Fetal Status:  Fetal Heart Rate (bpm): 152   Movement: Present    General: Alert, oriented and cooperative. Patient is in no acute distress.  Skin: Skin is warm and dry. No rash noted.   Cardiovascular:  Normal heart rate noted  Respiratory: Normal respiratory effort, no problems with respiration noted  Abdomen: Soft, gravid, appropriate for gestational age.  Pain/Pressure: Absent     Pelvic: Cervical exam deferred        Extremities: Normal range of motion.  Edema: None  Mental Status: Normal mood and affect. Normal behavior. Normal judgment and thought content.   Assessment and Plan:  Pregnancy: G3P1011 at [redacted]w[redacted]d 1. Supervision of high risk pregnancy, antepartum (Primary)   2. GBS (group B Streptococcus carrier), +RV culture, currently pregnant   3. Known fetal anomaly, antepartum, single or unspecified fetus Pelvic kidney  Term labor symptoms and general obstetric precautions including but not limited to vaginal bleeding, contractions, leaking of fluid and fetal movement were reviewed in detail with the patient. Please refer to After Visit Summary for other counseling recommendations.   Return in about 1 week (around 11/16/2023).  Future Appointments  Date Time Provider Department Center  11/14/2023  2:30 PM Zina Jerilynn LABOR, MD [facility] None    Lynwood Solomons, MD

## 2023-11-14 ENCOUNTER — Ambulatory Visit (INDEPENDENT_AMBULATORY_CARE_PROVIDER_SITE_OTHER): Payer: MEDICAID | Admitting: Obstetrics and Gynecology

## 2023-11-14 VITALS — BP 120/76 | HR 97 | Wt 232.0 lb

## 2023-11-14 DIAGNOSIS — O0990 Supervision of high risk pregnancy, unspecified, unspecified trimester: Secondary | ICD-10-CM

## 2023-11-14 DIAGNOSIS — Z3A37 37 weeks gestation of pregnancy: Secondary | ICD-10-CM | POA: Diagnosis not present

## 2023-11-14 DIAGNOSIS — B009 Herpesviral infection, unspecified: Secondary | ICD-10-CM | POA: Diagnosis not present

## 2023-11-14 DIAGNOSIS — O99820 Streptococcus B carrier state complicating pregnancy: Secondary | ICD-10-CM

## 2023-11-14 DIAGNOSIS — O09299 Supervision of pregnancy with other poor reproductive or obstetric history, unspecified trimester: Secondary | ICD-10-CM

## 2023-11-14 DIAGNOSIS — Z8759 Personal history of other complications of pregnancy, childbirth and the puerperium: Secondary | ICD-10-CM

## 2023-11-14 DIAGNOSIS — O35EXX0 Maternal care for other (suspected) fetal abnormality and damage, fetal genitourinary anomalies, not applicable or unspecified: Secondary | ICD-10-CM

## 2023-11-14 MED ORDER — VALACYCLOVIR HCL 500 MG PO TABS: 500.0000 mg | ORAL_TABLET | Freq: Two times a day (BID) | ORAL | 6 refills | Status: DC

## 2023-11-14 NOTE — Progress Notes (Signed)
 PRENATAL VISIT NOTE  Subjective:  [name] is a 23 y.o. G3P1011 at [redacted]w[redacted]d being seen today for ongoing prenatal care.  She is currently monitored for the following issues for this high-risk pregnancy and has Suicidal ideation; Borderline personality disorder (HCC); Generalized anxiety disorder; HSV-2 infection; Polysubstance abuse (HCC); Hx of suicide attempt; PTSD (post-traumatic stress disorder); 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 disorder; Tobacco use disorder; Homelessness; Family history of breast cancer; Pap smear of cervix shows high risk HPV present on 05/30/23; Substance induced mood disorder (HCC); Morbid obesity (HCC); Attention and concentration deficit; Panic disorder; Cannabis use disorder; Stimulant use disorder; Opioid use disorder; MDD (major depressive disorder), recurrent severe, without psychosis (HCC); Obesity (BMI 30-39.9); Alcohol use disorder; Need for community resource; Bipolar 1 disorder, mixed, severe (HCC); Supervision of high risk pregnancy, antepartum; Food insecurity; History of gestational hypertension; History of shoulder dystocia in prior pregnancy, currently pregnant; GBS (group B Streptococcus carrier), +RV culture, currently pregnant; Known fetal anomaly, antepartum (left pelvic kidney); and possible left pelvic kidney on their problem list.  Patient doing well with no acute concerns today. She reports no complaints.  Contractions: Not present. Vag. Bleeding: None.  Movement: Present. Denies leaking of fluid.   The following portions of the patient's history were reviewed and updated as appropriate: allergies, current medications, past family history, past medical history, past social history, past surgical history and problem list. Problem list updated.  Objective:   Vitals:   11/14/23 1453  BP: 120/76  Pulse: 97  Weight: 232 lb (105.2 kg)    Fetal Status: Fetal Heart Rate (bpm): 151 Fundal Height: 38 cm Movement: Present     General:  Alert, oriented and  cooperative. Patient is in no acute distress.  Skin: Skin is warm and dry. No rash noted.   Cardiovascular: Normal heart rate noted  Respiratory: Normal respiratory effort, no problems with respiration noted  Abdomen: Soft, gravid, appropriate for gestational age.  Pain/Pressure: Present     Pelvic: Cervical exam deferred        Extremities: Normal range of motion.  Edema: None  Mental Status:  Normal mood and affect. Normal behavior. Normal judgment and thought content.   Assessment and Plan:  Pregnancy: G3P1011 at [redacted]w[redacted]d  1. [redacted] weeks gestation of pregnancy (Primary)   2. Supervision of high risk pregnancy, antepartum Continue routine prenatal care  3. HSV-2 infection Pt called and she confirms hx of HSV, chart review does not show rx for valtrex , rx has been sent.  Pt called and discussed need and reason for anti-virals to potentially avoid  c section from outbreak  4. GBS (group B Streptococcus carrier), +RV culture, currently pregnant Treat in labor  5. History of gestational hypertension BP currently normal  6. History of shoulder dystocia in prior pregnancy, currently pregnant Per pt first delivery infant was 6 pounds 15 ounces, last growth scan was less than this weight, but she has been informed any significant lack of descent or dilation may increase chance of c section Schedule IOL at 40+ weeks not 41 weeks due to hx of dystocia  7. possible left pelvic kidney Seen on limited scan, peds will need to get early neonatal scan to evaluate  Term labor symptoms and general obstetric precautions including but not limited to vaginal bleeding, contractions, leaking of fluid and fetal movement were reviewed in detail with the patient.  Please refer to After Visit Summary for other counseling recommendations.   Return in about 1 week (around 11/21/2023) for ROB,  in person.   Jerilynn Buddle, MD Faculty Attending Center for [facility]

## 2023-11-18 ENCOUNTER — Encounter (HOSPITAL_COMMUNITY): Payer: Self-pay | Admitting: Obstetrics and Gynecology

## 2023-11-18 ENCOUNTER — Inpatient Hospital Stay (HOSPITAL_COMMUNITY)
Admission: AD | Admit: 2023-11-18 | Discharge: 2023-11-18 | Disposition: A | Discharge status: Home / Self Care | Payer: MEDICAID | Attending: Family Medicine | Admitting: Family Medicine

## 2023-11-18 DIAGNOSIS — Z3A38 38 weeks gestation of pregnancy: Secondary | ICD-10-CM | POA: Insufficient documentation

## 2023-11-18 DIAGNOSIS — O471 False labor at or after 37 completed weeks of gestation: Secondary | ICD-10-CM | POA: Insufficient documentation

## 2023-11-18 DIAGNOSIS — O0990 Supervision of high risk pregnancy, unspecified, unspecified trimester: Secondary | ICD-10-CM

## 2023-11-18 DIAGNOSIS — O99820 Streptococcus B carrier state complicating pregnancy: Secondary | ICD-10-CM

## 2023-11-18 LAB — RUPTURE OF MEMBRANE (ROM)PLUS: Rom Plus: NEGATIVE

## 2023-11-18 LAB — POCT FERN TEST: POCT Fern Test: NEGATIVE

## 2023-11-18 NOTE — MAU Note (Signed)
 [name] is a 23 y.o. at [redacted]w[redacted]d here in MAU reporting: has been leaking small amount of clear fluid since yesterday. Reports pelvic pain and pain   LMP:  Onset of complaint: yesterday  Pain score: 4-5 Vitals:   11/18/23 1439  BP: 119/72  Pulse: (!) 107  Resp: 18  Temp: 98.6 F (37 C)     FHT: 145  Lab orders placed from triage: fern

## 2023-11-18 NOTE — MAU Note (Signed)
 I have communicated with Dr. Suzen and reviewed vital signs:  Vitals:   11/18/23 1439  BP: 119/72  Pulse: (!) 107  Resp: 18  Temp: 98.6 F (37 C)    Vaginal exam:  Dilation: Closed Effacement (%): Thick Cervical Position: Posterior Exam by:: Nathanel Tanda PEAK,   Also reviewed contraction pattern and that non-stress test is reactive.  It has been documented that patient is  not contracting regularly with no cervical change since last check not indicating active labor.  ROM PLUS negative. Indicating no rupture of membranes.  Based on this report provider has given order for discharge.  A discharge order and diagnosis entered by a provider.   Labor discharge instructions reviewed with patient.

## 2023-11-18 NOTE — Progress Notes (Signed)
 S: [name] is a 23 y.o. G3P1011 at [redacted]w[redacted]d  who presents to MAU today complaining leaking small amount of clear fluid since yesterday. She denies vaginal bleeding. She reports normal fetal movement.     O: BP 119/72   Pulse (!) 107   Temp 98.6 F (37 C)   Resp 18   LMP 03/12/2023 (Approximate)  Cervical checked by RN w/ close cervix and no contraction    Fetal Monitoring: Baseline: 140's Variability: Good Accelerations: 15X15 Decelerations: None Contractions: None  Negative Fern test    A: [facility] at [redacted]w[redacted]d  False labor  P: Discharged home for self care   Suzen Elder B, DO 11/18/2023 4:31 PM

## 2023-11-22 ENCOUNTER — Ambulatory Visit (INDEPENDENT_AMBULATORY_CARE_PROVIDER_SITE_OTHER): Payer: MEDICAID | Admitting: Obstetrics and Gynecology

## 2023-11-22 VITALS — BP 117/80 | HR 78 | Wt 236.0 lb

## 2023-11-22 DIAGNOSIS — Z3A39 39 weeks gestation of pregnancy: Secondary | ICD-10-CM | POA: Diagnosis not present

## 2023-11-22 DIAGNOSIS — O09299 Supervision of pregnancy with other poor reproductive or obstetric history, unspecified trimester: Secondary | ICD-10-CM | POA: Diagnosis not present

## 2023-11-22 DIAGNOSIS — O0990 Supervision of high risk pregnancy, unspecified, unspecified trimester: Secondary | ICD-10-CM

## 2023-11-22 DIAGNOSIS — O359XX0 Maternal care for (suspected) fetal abnormality and damage, unspecified, not applicable or unspecified: Secondary | ICD-10-CM | POA: Diagnosis not present

## 2023-11-22 DIAGNOSIS — B009 Herpesviral infection, unspecified: Secondary | ICD-10-CM

## 2023-11-22 DIAGNOSIS — Z8759 Personal history of other complications of pregnancy, childbirth and the puerperium: Secondary | ICD-10-CM

## 2023-11-22 NOTE — Progress Notes (Signed)
   PRENATAL VISIT NOTE  Subjective:  [name] is a 23 y.o. G3P1011 at [redacted]w[redacted]d being seen today for ongoing prenatal care.  She is currently monitored for the following issues for this high-risk pregnancy and has Suicidal ideation; Borderline personality disorder (HCC); Generalized anxiety disorder; HSV-2 infection; Polysubstance abuse (HCC); Hx of suicide attempt; PTSD (post-traumatic stress disorder); 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 disorder; Tobacco use disorder; Homelessness; Family history of breast cancer; Pap smear of cervix shows high risk HPV present on 05/30/23; Substance induced mood disorder (HCC); Morbid obesity (HCC); Attention and concentration deficit; Panic disorder; Cannabis use disorder; Stimulant use disorder; Opioid use disorder; MDD (major depressive disorder), recurrent severe, without psychosis (HCC); Obesity (BMI 30-39.9); Alcohol use disorder; Need for community resource; Bipolar 1 disorder, mixed, severe (HCC); Supervision of high risk pregnancy, antepartum; Food insecurity; History of gestational hypertension; History of shoulder dystocia in prior pregnancy, currently pregnant; GBS (group B Streptococcus carrier), +RV culture, currently pregnant; Known fetal anomaly, antepartum (left pelvic kidney); and possible left pelvic kidney on their problem list.  Patient doing well with no acute concerns today. She reports no complaints.  Contractions: Irregular. Vag. Bleeding: None.  Movement: Present. Denies leaking of fluid.   The following portions of the patient's history were reviewed and updated as appropriate: allergies, current medications, past family history, past medical history, past social history, past surgical history and problem list. Problem list updated.  Objective:   Vitals:   11/22/23 1600  BP: 117/80  Pulse: 78  Weight: 236 lb (107 kg)    Fetal Status: Fetal Heart Rate (bpm): 140 Fundal Height: 39 cm Movement: Present     General:  Alert, oriented and  cooperative. Patient is in no acute distress.  Skin: Skin is warm and dry. No rash noted.   Cardiovascular: Normal heart rate noted  Respiratory: Normal respiratory effort, no problems with respiration noted  Abdomen: Soft, gravid, appropriate for gestational age.  Pain/Pressure: Present     Pelvic: Cervical exam deferred        Extremities: Normal range of motion.     Mental Status:  Normal mood and affect. Normal behavior. Normal judgment and thought content.   Assessment and Plan:  Pregnancy: G3P1011 at [redacted]w[redacted]d  1. [redacted] weeks gestation of pregnancy (Primary)   2. Supervision of high risk pregnancy, antepartum Continue routine prenatal care  3. Known fetal anomaly, antepartum, single or unspecified fetus Pelvic kidney noted, will needs peds urology post delivery for evaluation  4. History of shoulder dystocia in prior pregnancy, currently pregnant Previous infant was 6 pounds 15 ounces, current infant 5 pounds 9 ounces as of 10/25/23, IOL to be scheduled at 40 weeks if possible  5. History of gestational hypertension BP normal today  6. HSV-2 infection Pt denies recent outbreak and notes compliance with prophylaxis  Term labor symptoms and general obstetric precautions including but not limited to vaginal bleeding, contractions, leaking of fluid and fetal movement were reviewed in detail with the patient.  Please refer to After Visit Summary for other counseling recommendations.   Return in about 1 week (around 11/29/2023) for ROB, in person.  IOL to be scheduled around 40 weeks Jerilynn Buddle, MD Faculty Attending Center for [facility]

## 2023-11-23 ENCOUNTER — Telehealth (HOSPITAL_COMMUNITY): Payer: Self-pay | Admitting: *Deleted

## 2023-11-23 ENCOUNTER — Encounter (HOSPITAL_COMMUNITY): Payer: Self-pay | Admitting: *Deleted

## 2023-11-23 NOTE — Telephone Encounter (Signed)
 Preadmission screen

## 2023-11-27 ENCOUNTER — Inpatient Hospital Stay (EMERGENCY_DEPARTMENT_HOSPITAL)
Admission: AD | Admit: 2023-11-27 | Discharge: 2023-11-27 | Disposition: A | Discharge status: Home / Self Care | Payer: MEDICAID | Source: Home / Self Care | Attending: Obstetrics and Gynecology | Admitting: Obstetrics and Gynecology

## 2023-11-27 ENCOUNTER — Other Ambulatory Visit: Payer: Self-pay

## 2023-11-27 DIAGNOSIS — O471 False labor at or after 37 completed weeks of gestation: Secondary | ICD-10-CM | POA: Insufficient documentation

## 2023-11-27 DIAGNOSIS — Z3A39 39 weeks gestation of pregnancy: Secondary | ICD-10-CM | POA: Diagnosis not present

## 2023-11-27 DIAGNOSIS — O479 False labor, unspecified: Secondary | ICD-10-CM | POA: Diagnosis not present

## 2023-11-27 DIAGNOSIS — O0993 Supervision of high risk pregnancy, unspecified, third trimester: Secondary | ICD-10-CM | POA: Insufficient documentation

## 2023-11-27 DIAGNOSIS — O0990 Supervision of high risk pregnancy, unspecified, unspecified trimester: Secondary | ICD-10-CM

## 2023-11-27 DIAGNOSIS — O99820 Streptococcus B carrier state complicating pregnancy: Secondary | ICD-10-CM | POA: Insufficient documentation

## 2023-11-27 MED ORDER — CYCLOBENZAPRINE HCL 5 MG PO TABS
10.0000 mg | ORAL_TABLET | Freq: Once | ORAL | Status: AC
  Administered 2023-11-27: 10 mg via ORAL
  Filled 2023-11-27: qty 2

## 2023-11-27 NOTE — Discharge Instructions (Signed)
 Please go to the MAU (Maternity Assessment Unit) at [facility] if you experience vaginal bleeding,leaking/gush of fluid like your water broke, notice decreased movement from your baby after doing kick counts, or contractions the are becoming more intense or more frequent.   Fetal Movement Counts When you're pregnant, you might start feeling your baby move around the middle of your pregnancy. At first, these movements might feel like flutters, rolls, or swishes. As your baby grows, you might feel more kicks and jabs. Around week 28 of your pregnancy, your health care team may ask you to count how often your baby moves. This is important for all pregnancies, but especially for high-risk ones. Counting movements can help lessen the risk of stillbirth.  What is a fetal movement count? A fetal movement count is the number of times that you feel your baby move during a certain amount of time. This may also be called a kick count. There are many ways to do a kick count. Ask your team what is best for you. Pay attention to when your baby is most active. You may notice your baby's sleep and wake cycles. You may also notice things that make your baby move more. When you do a kick count, try to do it: When your baby is normally most active. At the same time each day.  How do I count fetal movements? Find a quiet, comfortable area. Sit or lie down. Write down the date, the start time, and the number of movements you feel. Count kicks, flutters, swishes, rolls, and jabs. Usually, you will feel at least 10 movements within 2 hours. Stop counting after you have felt 10 movements or if you have been counting for 2 hours. Write down the stop time.  Contact a health care provider if:  You don't feel 10 movements in 2 hours. Your baby isn't moving as it usually does. Your baby isn't moving at all. If you're not able to reach your provider, go to an emergency room. This information is not intended to  replace advice given to you by your health care provider. Make sure you discuss any questions you have with your health care provider.  Document Revised: 02/04/2023 Document Reviewed: 01/27/2022 Elsevier Patient Education  2025 [employer].  Signs and Symptoms of Labor Labor is the body's natural process of moving the baby and the placenta out of the uterus. The process of labor usually starts when the baby is full-term, 37 weeks and 0 days or more.   As your body prepares for labor and the birth of your baby, you may notice the following symptoms in the weeks and days before true labor starts: Your baby dropping lower into your pelvis to get into position for birth (lightening). When this happens, you may feel more pressure on your bladder and pelvic bone and less pressure on your ribs. This may make it easier to breathe. It may also cause you to need to urinate more often and have problems with bowel movements. Having practice contractions, also called Braxton Hicks contractions or false labor. These occur at irregular (unevenly spaced) intervals that are more than 10 minutes apart. False labor contractions are common after exercise or sexual activity. They will stop if you change position, rest, or drink fluids. These contractions are usually mild and do not get stronger over time. They may feel like: A backache or back pain. Mild cramps, similar to menstrual cramps. Tightening or pressure in your abdomen. Passing a small amount of  thick, bloody mucus from your vagina. This is called normal bloody show or losing your mucus plug. This may happen more than a week before labor begins, or right before labor begins, as the opening of the cervix starts to widen (dilate). For some women, the entire mucus plug passes at once. For others, pieces of the mucus plug may gradually pass over several days.  Signs and symptoms that labor has begun Signs that you are in labor may include: Having  contractions that come at regular (evenly spaced) intervals and increase in intensity. This may feel like more intense tightening or pressure in your abdomen that moves to your back. Feeling pressure in the vaginal area. Your water breaking (called rupture of membranes). This is when the sac of fluid that surrounds your baby breaks. Fluid leaking from your vagina may be clear or blood-tinged. Labor usually starts within 24 hours of your water breaking, but it may take longer to begin. Some people may feel a sudden gush of fluid; others may notice repeatedly damp underwear.  Go to the hospital when  Your water breaks. Your labor has started: painful, regular contractions that are 5 minutes apart or less. You have a fever. You have bright red blood coming from your vagina. You do not feel your baby moving. You have a severe headache with or without vision problems. You have chest pain or shortness of breath. These symptoms may represent a serious problem that is an emergency. Do not wait to see if the symptoms will go away. Get medical help right away. Call your local emergency services (911 in the [place] Do not drive yourself to the hospital.  Summary Labor is your body's natural process of moving your baby and the placenta out of your uterus. The process of labor usually starts when your baby is full-term When labor starts, or if your water breaks, call your health care provider or nurse care line. Based on your situation, they will determine when you should go in for an exam. This information is not intended to replace advice given to you by your health care provider. Make sure you discuss any questions you have with your health care provider.  Document Revised: 05/27/2020 Document Reviewed: 05/27/2020-- adapted Elsevier Patient Education  2024 [employer].

## 2023-11-27 NOTE — MAU Note (Signed)
 [name] is a 23 y.o. at [redacted]w[redacted]d here in MAU reporting: she began having cramps this morning that have progressively worsened.  Also complain of lower back that started after the onset of the cramping, it's a constant  aching & throbbing pain.  Denies VB and LOF.  Endorses +FM.  LMP: 03/12/2023 Onset of complaint: today Pain score: 6 cramping 7 back pain Vitals:   11/27/23 1335  BP: 134/73  Pulse: 94  Resp: 18  Temp: 98.1 F (36.7 C)  SpO2: 99%     FHT: 142 bpm  Lab orders placed from triage: None

## 2023-11-27 NOTE — MAU Provider Note (Signed)
 S: Patient is here for RN labor evaluation. Fetal tracing, vital signs, & chart reviewed   O:  Vitals:   11/27/23 1330 11/27/23 1335  BP:  134/73  Pulse:  94  Resp:  18  Temp:  98.1 F (36.7 C)  TempSrc:  Oral  SpO2:  99%  Weight: 107.2 kg   Height: 5' 9 (1.753 m)    No results found for this or any previous visit (from the past 24 hours).  Dilation: 1 Effacement (%): 50 Cervical Position: Posterior Station: -3 Presentation: Vertex Exam by:: E Lipscomb RN   FHR: 130 bpm, Mod Var, - Decels, + Accels UC: rare   A: 1. False labor   2. Supervision of high risk pregnancy, antepartum   3. GBS (group B Streptococcus carrier), +RV culture, currently pregnant   4. [redacted] weeks gestation of pregnancy      P:  RN to discharge home in stable condition with return precautions & fetal kick counts  Barabara Maier, DO 6:43 PM

## 2023-11-29 ENCOUNTER — Ambulatory Visit: Payer: MEDICAID | Admitting: Advanced Practice Midwife

## 2023-11-29 ENCOUNTER — Other Ambulatory Visit: Payer: Self-pay

## 2023-11-29 ENCOUNTER — Inpatient Hospital Stay (HOSPITAL_COMMUNITY)
Admission: AD | Admit: 2023-11-29 | Discharge: 2023-12-01 | DRG: 806 | Disposition: A | Discharge status: Home / Self Care | Payer: MEDICAID | Attending: Obstetrics and Gynecology | Admitting: Obstetrics and Gynecology

## 2023-11-29 ENCOUNTER — Encounter (HOSPITAL_COMMUNITY): Payer: Self-pay | Admitting: Obstetrics and Gynecology

## 2023-11-29 VITALS — BP 120/74 | HR 93 | Wt 235.3 lb

## 2023-11-29 DIAGNOSIS — B009 Herpesviral infection, unspecified: Secondary | ICD-10-CM

## 2023-11-29 DIAGNOSIS — O326XX0 Maternal care for compound presentation, not applicable or unspecified: Secondary | ICD-10-CM | POA: Diagnosis present

## 2023-11-29 DIAGNOSIS — R41840 Attention and concentration deficit: Secondary | ICD-10-CM

## 2023-11-29 DIAGNOSIS — O26893 Other specified pregnancy related conditions, third trimester: Secondary | ICD-10-CM | POA: Diagnosis present

## 2023-11-29 DIAGNOSIS — Z3A40 40 weeks gestation of pregnancy: Secondary | ICD-10-CM | POA: Diagnosis not present

## 2023-11-29 DIAGNOSIS — O0990 Supervision of high risk pregnancy, unspecified, unspecified trimester: Secondary | ICD-10-CM

## 2023-11-29 DIAGNOSIS — F411 Generalized anxiety disorder: Secondary | ICD-10-CM

## 2023-11-29 DIAGNOSIS — O99824 Streptococcus B carrier state complicating childbirth: Secondary | ICD-10-CM | POA: Diagnosis present

## 2023-11-29 DIAGNOSIS — O99820 Streptococcus B carrier state complicating pregnancy: Secondary | ICD-10-CM | POA: Diagnosis not present

## 2023-11-29 DIAGNOSIS — Z833 Family history of diabetes mellitus: Secondary | ICD-10-CM

## 2023-11-29 DIAGNOSIS — O99214 Obesity complicating childbirth: Secondary | ICD-10-CM | POA: Diagnosis present

## 2023-11-29 DIAGNOSIS — R030 Elevated blood-pressure reading, without diagnosis of hypertension: Secondary | ICD-10-CM | POA: Diagnosis present

## 2023-11-29 DIAGNOSIS — F603 Borderline personality disorder: Secondary | ICD-10-CM

## 2023-11-29 DIAGNOSIS — O99324 Drug use complicating childbirth: Secondary | ICD-10-CM | POA: Diagnosis present

## 2023-11-29 DIAGNOSIS — F3163 Bipolar disorder, current episode mixed, severe, without psychotic features: Secondary | ICD-10-CM

## 2023-11-29 DIAGNOSIS — O359XX0 Maternal care for (suspected) fetal abnormality and damage, unspecified, not applicable or unspecified: Secondary | ICD-10-CM | POA: Diagnosis not present

## 2023-11-29 DIAGNOSIS — O0993 Supervision of high risk pregnancy, unspecified, third trimester: Secondary | ICD-10-CM | POA: Diagnosis not present

## 2023-11-29 DIAGNOSIS — Z87891 Personal history of nicotine dependence: Secondary | ICD-10-CM | POA: Diagnosis not present

## 2023-11-29 DIAGNOSIS — F1290 Cannabis use, unspecified, uncomplicated: Secondary | ICD-10-CM | POA: Diagnosis present

## 2023-11-29 DIAGNOSIS — O480 Post-term pregnancy: Secondary | ICD-10-CM | POA: Diagnosis not present

## 2023-11-29 DIAGNOSIS — Z9151 Personal history of suicidal behavior: Secondary | ICD-10-CM

## 2023-11-29 LAB — CBC
HCT: 36.7 % (ref 36.0–46.0)
Hemoglobin: 12.8 g/dL (ref 12.0–15.0)
MCH: 32.6 pg (ref 26.0–34.0)
MCHC: 34.9 g/dL (ref 30.0–36.0)
MCV: 93.4 fL (ref 80.0–100.0)
Platelets: 219 K/uL (ref 150–400)
RBC: 3.93 MIL/uL (ref 3.87–5.11)
RDW: 12.6 % (ref 11.5–15.5)
WBC: 8.5 K/uL (ref 4.0–10.5)
nRBC: 0 % (ref 0.0–0.2)

## 2023-11-29 LAB — COMPREHENSIVE METABOLIC PANEL WITH GFR
ALT: 12 U/L (ref 0–44)
AST: 24 U/L (ref 15–41)
Albumin: 3.1 g/dL — ABNORMAL LOW (ref 3.5–5.0)
Alkaline Phosphatase: 148 U/L — ABNORMAL HIGH (ref 38–126)
Anion gap: 11 (ref 5–15)
BUN: 9 mg/dL (ref 6–20)
CO2: 17 mmol/L — ABNORMAL LOW (ref 22–32)
Calcium: 8.7 mg/dL — ABNORMAL LOW (ref 8.9–10.3)
Chloride: 106 mmol/L (ref 98–111)
Creatinine, Ser: 0.62 mg/dL (ref 0.44–1.00)
GFR, Estimated: >60 mL/min (ref >60)
Glucose, Bld: 133 mg/dL — ABNORMAL HIGH (ref 70–99)
Potassium: 4 mmol/L (ref 3.5–5.1)
Sodium: 134 mmol/L — ABNORMAL LOW (ref 135–145)
Total Bilirubin: 0.4 mg/dL (ref 0.0–1.2)
Total Protein: 6.4 g/dL — ABNORMAL LOW (ref 6.5–8.1)

## 2023-11-29 LAB — TYPE AND SCREEN
ABO/RH(D): A POS
Antibody Screen: NEGATIVE

## 2023-11-29 MED ORDER — MISOPROSTOL 50MCG HALF TABLET
50.0000 ug | ORAL_TABLET | Freq: Once | ORAL | Status: AC
  Administered 2023-11-29: 50 ug via ORAL
  Filled 2023-11-29: qty 1

## 2023-11-29 MED ORDER — PENICILLIN G POT IN DEXTROSE 60000 UNIT/ML IV SOLN
3.0000 10*6.[IU] | INTRAVENOUS | Status: DC
  Administered 2023-11-30 (×2): 3 10*6.[IU] via INTRAVENOUS
  Filled 2023-11-29 (×2): qty 50

## 2023-11-29 MED ORDER — LACTATED RINGERS IV SOLN: 500.0000 mL | INTRAVENOUS | Status: DC | PRN

## 2023-11-29 MED ORDER — OXYTOCIN BOLUS FROM INFUSION: 333.0000 mL | Freq: Once | INTRAVENOUS | Status: DC

## 2023-11-29 MED ORDER — TERBUTALINE SULFATE 1 MG/ML IJ SOLN: 0.2500 mg | Freq: Once | INTRAMUSCULAR | Status: DC | PRN

## 2023-11-29 MED ORDER — ONDANSETRON HCL 4 MG/2ML IJ SOLN
4.0000 mg | Freq: Four times a day (QID) | INTRAMUSCULAR | Status: DC | PRN
  Administered 2023-11-30: 4 mg via INTRAVENOUS
  Filled 2023-11-29: qty 2

## 2023-11-29 MED ORDER — MISOPROSTOL 25 MCG QUARTER TABLET
25.0000 ug | ORAL_TABLET | Freq: Once | ORAL | Status: AC
Start: 2023-11-29 — End: 2023-11-29
  Administered 2023-11-29: 25 ug via VAGINAL
  Filled 2023-11-29: qty 1

## 2023-11-29 MED ORDER — LACTATED RINGERS IV SOLN: INTRAVENOUS | Status: DC

## 2023-11-29 MED ORDER — OXYTOCIN-SODIUM CHLORIDE 30-0.9 UT/500ML-% IV SOLN: 2.5000 [IU]/h | INTRAVENOUS | Status: DC

## 2023-11-29 MED ORDER — LIDOCAINE HCL (PF) 1 % IJ SOLN: 30.0000 mL | INTRAMUSCULAR | Status: DC | PRN

## 2023-11-29 MED ORDER — FENTANYL CITRATE (PF) 100 MCG/2ML IJ SOLN
50.0000 ug | INTRAMUSCULAR | Status: DC | PRN
  Administered 2023-11-30: 100 ug via INTRAVENOUS
  Filled 2023-11-29: qty 2

## 2023-11-29 MED ORDER — HYDROXYZINE HCL 50 MG PO TABS: 50.0000 mg | ORAL_TABLET | Freq: Four times a day (QID) | ORAL | Status: DC | PRN

## 2023-11-29 MED ORDER — SOD CITRATE-CITRIC ACID 500-334 MG/5ML PO SOLN: 30.0000 mL | ORAL | Status: DC | PRN

## 2023-11-29 MED ORDER — OXYCODONE-ACETAMINOPHEN 5-325 MG PO TABS: 1.0000 | ORAL_TABLET | ORAL | Status: DC | PRN

## 2023-11-29 MED ORDER — OXYTOCIN-SODIUM CHLORIDE 30-0.9 UT/500ML-% IV SOLN
1.0000 m[IU]/min | INTRAVENOUS | Status: DC
  Filled 2023-11-29: qty 500

## 2023-11-29 MED ORDER — SODIUM CHLORIDE 0.9 % IV SOLN
5.0000 10*6.[IU] | Freq: Once | INTRAVENOUS | Status: AC
  Administered 2023-11-29: 5 10*6.[IU] via INTRAVENOUS
  Filled 2023-11-29: qty 5

## 2023-11-29 MED ORDER — ACETAMINOPHEN 325 MG PO TABS: 650.0000 mg | ORAL_TABLET | ORAL | Status: DC | PRN

## 2023-11-29 NOTE — Progress Notes (Signed)
 PRENATAL VISIT NOTE  Subjective:  [name] is a 23 y.o. G3P1011 at [redacted]w[redacted]d being seen today for ongoing prenatal care.  She is currently monitored for the following issues for this high-risk pregnancy and has Suicidal ideation; Borderline personality disorder (HCC); Generalized anxiety disorder; HSV-2 infection; Polysubstance abuse (HCC); Hx of suicide attempt; PTSD (post-traumatic stress disorder); 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 disorder; Tobacco use disorder; Homelessness; Family history of breast cancer; Pap smear of cervix shows high risk HPV present on 05/30/23; Substance induced mood disorder (HCC); Morbid obesity (HCC); Attention and concentration deficit; Panic disorder; Cannabis use disorder; Stimulant use disorder; Opioid use disorder; MDD (major depressive disorder), recurrent severe, without psychosis (HCC); Obesity (BMI 30-39.9); Alcohol use disorder; Need for community resource; Bipolar 1 disorder, mixed, severe (HCC); Supervision of high risk pregnancy, antepartum; Food insecurity; History of gestational hypertension; History of shoulder dystocia in prior pregnancy, currently pregnant; GBS (group B Streptococcus carrier), +RV culture, currently pregnant; Known fetal anomaly, antepartum (left pelvic kidney); and possible left pelvic kidney on their problem list.  Patient reports occasional contractions.  Contractions: Irritability. Vag. Bleeding: Other (lost mucus plug this morning. Dark brown spotting).  Movement: Present. Denies leaking of fluid.   The following portions of the patient's history were reviewed and updated as appropriate: allergies, current medications, past family history, past medical history, past social history, past surgical history and problem list.   Objective:   Vitals:   11/29/23 1444  BP: 120/74  Pulse: 93  Weight: 235 lb 4.8 oz (106.7 kg)    Fetal Status:  Fetal Heart Rate (bpm): NST-NR Fundal Height: 39 cm Movement: Present    General: Alert, oriented  and cooperative. Patient is in no acute distress.  Skin: Skin is warm and dry. No rash noted.   Cardiovascular: Normal heart rate noted  Respiratory: Normal respiratory effort, no problems with respiration noted  Abdomen: Soft, gravid, appropriate for gestational age.  Pain/Pressure: Present     Pelvic: Cervical exam deferred        Extremities: Normal range of motion.  Edema: Trace  Mental Status: Normal mood and affect. Normal behavior. Normal judgment and thought content.      10/31/2023    2:36 PM 05/30/2023    1:19 PM 05/16/2023    3:03 PM  Depression screen PHQ 2/9  Decreased Interest 1 3 2   Down, Depressed, Hopeless 1 3 2   PHQ - 2 Score 2 6 4   Altered sleeping 0 3 2  Tired, decreased energy 3 3 3   Change in appetite 0 1 0  Feeling bad or failure about yourself  1 3 3   Trouble concentrating 3 3 3   Moving slowly or fidgety/restless 0 1 1  Suicidal thoughts 0 0 0  PHQ-9 Score 9 20 16   Difficult doing work/chores Not difficult at all Somewhat difficult         10/31/2023    2:38 PM 05/30/2023    1:21 PM 05/16/2023    3:05 PM 02/15/2023    2:50 PM  GAD 7 : Generalized Anxiety Score  Nervous, Anxious, on Edge 1 3 3    Control/stop worrying 1 3 3    Worry too much - different things 1 3 3    Trouble relaxing 0 3 3   Restless 0 1 0   Easily annoyed or irritable 3 3 3    Afraid - awful might happen 1 3 3    Total GAD 7 Score 7 19 18    Anxiety Difficulty Not difficult at all  Somewhat difficult       Information is confidential and restricted. Go to Review Flowsheets to unlock data.    Assessment and Plan:  Pregnancy: G3P1011 at [redacted]w[redacted]d 1. Supervision of high risk pregnancy, antepartum (Primary) --Nonreactive NST in office today, one 25 second variable, and no 15 x 15 accels.   --Pt to hospital for IOL for nonreactive NST --Reviewed induction process, medications/methods used, expectations for time/hospital stay. Questions answered.   2. HSV-2 infection --On Valtrex   3. Known  fetal anomaly, antepartum, single or unspecified fetus --suspected left pelvic kidney  4. GBS (group B Streptococcus carrier), +RV culture, currently pregnant --Prophylaxis in labor  5. [redacted] weeks gestation of pregnancy   Term labor symptoms and general obstetric precautions including but not limited to vaginal bleeding, contractions, leaking of fluid and fetal movement were reviewed in detail with the patient. Please refer to After Visit Summary for other counseling recommendations.   No follow-ups on file.  Future Appointments  Date Time Provider Department Center  11/30/2023  7:15 AM [facility] SCHED ROOM [facility] None  01/04/2024  2:30 PM Delores Nidia CROME, FNP [facility] None    Olam Boards, CNM

## 2023-11-29 NOTE — Progress Notes (Signed)
 Pt presents for rob. Pt states that she lost her mucus plug this morning and is having some brown discharge.

## 2023-11-29 NOTE — Progress Notes (Signed)
 [name] is a 23 y.o. G3P1011 at [redacted]w[redacted]d by 11 week ultrasound admitted for induction of labor due to nonreassuring FHR.  Subjective: Pt breathing with contractions, finished a walk in the hallway. Pt sister in room for support.   Objective: BP (!) 135/98   Pulse (!) 103   Temp 98.2 F (36.8 C) (Axillary)   Resp 18   Ht 5' 9 (1.753 m)   Wt 108 kg   LMP 03/12/2023 (Approximate)   BMI 35.18 kg/m  No intake/output data recorded. No intake/output data recorded.  FHT:  FHR: 180 bpm, variability: moderate,  accelerations:  Present,  decelerations:  Present repetitive late decels resolved with position change, IV fluids, fetal tachycardia persists.  Variables after  AROM.  UC:   regular, every 2 minutes SVE:   Dilation: 4.5 Effacement (%): 70 Station: -2 Exam by:: Stormy Oklesh SCNM AROM with clear fluid, pt tolerated well  Labs: Lab Results  Component Value Date   WBC 8.5 11/29/2023   HGB 12.8 11/29/2023   HCT 36.7 11/29/2023   MCV 93.4 11/29/2023   PLT 219 11/29/2023    Assessment / Plan: IOL for nonreactive NST S/p Cytotec dual dose  Labor: Progressing well, discussed AROM. Pt opted for AROM.  Questions answered about pain management options.   Preeclampsia:  n/a Fetal Wellbeing:  Category II Pain Control:  Labor support without medications I/D:  GBS positive Anticipated MOD:  NSVD  Olam Boards, CNM 11/29/2023, 11:59 PM

## 2023-11-29 NOTE — H&P (Signed)
 [name] is a 23 y.o. female G3P1011 at [redacted]w[redacted]d presenting for IOL for nonreactive NST in the office today.  Pregnancy is complicated by social needs (housing, food, anxiety/depression). Hx substance use, none in pregnancy except marijuana. Pt has counselor/psychiatrist she sees regularly.    MFM US  with EFW 40% on 10/25/23.  NURSING  PROVIDER  Office Location [facility] Dating by US  at [redacted]w[redacted]d  [facility] Model Traditional Anatomy U/S Incomplete  Initiated care at  9wks                Language  English              LAB RESULTS   Support Person  BEST FRIEND/MOESHA Genetics NIPS: low risks AFP:     NT/IT (FT only)     Carrier Screen Horizon: neg x 4  Rhogam  A/Positive/-- (04/21 1547) A1C/GTT Early HgbA1C: 5.2 Third trimester 2 hr GTT: nl 2 hour  Flu Vaccine No    TDaP Vaccine  DECLINED 10/31/23 Blood Type A/Positive/-- (04/21 1547)  RSV Vaccine  Antibody Negative (04/21 1547)  COVID Vaccine  Yes Rubella 1.69 (04/21 1547)  Feeding Plan breast RPR Non Reactive (08/18 0839)  Contraception BTL HBsAg Negative (04/21 1547)  Circumcision  Yes if female HIV Non Reactive (08/18 0839)  Pediatrician   HCVAb Non Reactive (04/21 1547)  Prenatal Classes     BTL Consent  10/31/2023 Pap Diagnosis  Date Value Ref Range Status  05/30/2023   Final   - Negative for intraepithelial lesion or malignancy (NILM)  +HRHPV, neg 16, 18/45>repeat in one year  BTL Pre-payment  GC/CT Initial:   36wks:    VBAC Consent  GBS   For PCN allergy, check sensitivities   BRx Optimized? [ ]  yes   [x ] no    DME Rx [ x] BP cuff [ ]  Weight Scale Waterbirth  [ ]  Class [ ]  Consent [ ]  CNM visit  PHQ9 & GAD7 [ x ] new OB [  ] 28 weeks  [x]  36 weeks Induction  [ ]  Orders Entered [ ] Foley Y/N     OB History     Gravida  3   Para  1   Term  1   Preterm  0   AB  1   Living  1      SAB  1   IAB  0   Ectopic  0   Multiple  0   Live Births  1          Past Medical History:  Diagnosis Date   Anxiety    ASCUS  with positive high risk HPV cervical 03/13/2020   Asthma    Asthma due to seasonal allergies 03/13/2020   Gonorrhea 04/21/2022   Headache(784.0)    Hx of suicide attempt    Irritable bowel syndrome 03/25/2020   Major depressive disorder    Morbid obesity (HCC) 03/25/2020   PTSD (post-traumatic stress disorder)    Seasonal allergies 03/13/2020   Shoulder dystocia during labor and delivery 06/16/2016   45s, relieved by delivery of posterior arm     Past Surgical History:  Procedure Laterality Date   wisdom tooth extraction     Family History: family history includes Breast cancer in her maternal grandmother and another family member; Cancer in her maternal grandmother; Diabetes in her maternal aunt and maternal grandmother. Social History:  reports that she has quit smoking. Her smoking use included cigarettes and cigars. She has a 2 pack-year  smoking history. She has been exposed to tobacco smoke. She has never used smokeless tobacco. She reports that she does not currently use alcohol. She reports that she does not currently use drugs after having used the following drugs: Marijuana.     Maternal Diabetes: No Genetic Screening: Normal Maternal Ultrasounds/Referrals: Normal Fetal Ultrasounds or other Referrals:  None Maternal Substance Abuse:  Yes:  Type: Marijuana Significant Maternal Medications:  None Significant Maternal Lab Results:  Group B Strep positive Number of Prenatal Visits:greater than 3 verified prenatal visits Maternal Vaccinations:declined Other Comments:  None  Review of Systems History Dilation: 3 Effacement (%): 60 Station: -2 Exam by:: Clara Curtiss RN Blood pressure (!) 146/80, pulse 96, temperature 97.9 F (36.6 C), temperature source Oral, resp. rate 18, height 5' 9 (1.753 m), weight 108 kg, last menstrual period 03/12/2023, unknown if currently breastfeeding. Exam Physical Exam  Prenatal labs: ABO, Rh: --/--/A POS (11/04 1818) Antibody: NEG (11/04  1818) Rubella: 1.69 (04/21 1547) RPR: Non Reactive (08/18 0839)  HBsAg: Negative (04/21 1547)  HIV: Non Reactive (08/18 0839)  GBS: Positive/-- (10/06 1502)   Assessment/Plan: [id number]  at [redacted]w[redacted]d  admitted for IOL for nonreactive NST GBS positive, PCN ordered Plans to breastfeed IUD outpatient for contraception Category I FHR tracing on admission Dual Cytotec and foley balloon to start induction  Olam Boards 11/29/2023, 9:10 PM

## 2023-11-30 ENCOUNTER — Inpatient Hospital Stay (HOSPITAL_COMMUNITY): Payer: MEDICAID

## 2023-11-30 ENCOUNTER — Inpatient Hospital Stay (HOSPITAL_COMMUNITY): Payer: MEDICAID | Admitting: Anesthesiology

## 2023-11-30 ENCOUNTER — Encounter (HOSPITAL_COMMUNITY): Payer: Self-pay | Admitting: Obstetrics and Gynecology

## 2023-11-30 DIAGNOSIS — O99824 Streptococcus B carrier state complicating childbirth: Secondary | ICD-10-CM

## 2023-11-30 DIAGNOSIS — O99344 Other mental disorders complicating childbirth: Secondary | ICD-10-CM

## 2023-11-30 DIAGNOSIS — O480 Post-term pregnancy: Secondary | ICD-10-CM

## 2023-11-30 DIAGNOSIS — O99324 Drug use complicating childbirth: Secondary | ICD-10-CM

## 2023-11-30 DIAGNOSIS — O99820 Streptococcus B carrier state complicating pregnancy: Secondary | ICD-10-CM

## 2023-11-30 DIAGNOSIS — Z3A40 40 weeks gestation of pregnancy: Secondary | ICD-10-CM

## 2023-11-30 LAB — RPR: RPR Ser Ql: NONREACTIVE

## 2023-11-30 MED ORDER — IBUPROFEN 600 MG PO TABS
600.0000 mg | ORAL_TABLET | Freq: Four times a day (QID) | ORAL | Status: DC
  Administered 2023-11-30 – 2023-12-01 (×5): 600 mg via ORAL
  Filled 2023-11-30 (×6): qty 1

## 2023-11-30 MED ORDER — PHENYLEPHRINE 80 MCG/ML (10ML) SYRINGE FOR IV PUSH (FOR BLOOD PRESSURE SUPPORT): 80.0000 ug | PREFILLED_SYRINGE | INTRAVENOUS | Status: DC | PRN

## 2023-11-30 MED ORDER — BENZOCAINE-MENTHOL 20-0.5 % EX AERO: 1.0000 | INHALATION_SPRAY | CUTANEOUS | Status: DC | PRN

## 2023-11-30 MED ORDER — ONDANSETRON HCL 4 MG/2ML IJ SOLN: 4.0000 mg | INTRAMUSCULAR | Status: DC | PRN

## 2023-11-30 MED ORDER — METHYLERGONOVINE MALEATE 0.2 MG PO TABS: 0.2000 mg | ORAL_TABLET | ORAL | Status: DC | PRN

## 2023-11-30 MED ORDER — LACTATED RINGERS IV SOLN: 500.0000 mL | Freq: Once | INTRAVENOUS | Status: DC

## 2023-11-30 MED ORDER — OXYCODONE HCL 5 MG PO TABS: 10.0000 mg | ORAL_TABLET | ORAL | Status: DC | PRN

## 2023-11-30 MED ORDER — SENNOSIDES-DOCUSATE SODIUM 8.6-50 MG PO TABS
2.0000 | ORAL_TABLET | Freq: Every day | ORAL | Status: DC
  Administered 2023-12-01: 2 via ORAL
  Filled 2023-11-30: qty 2

## 2023-11-30 MED ORDER — SIMETHICONE 80 MG PO CHEW: 80.0000 mg | CHEWABLE_TABLET | ORAL | Status: DC | PRN

## 2023-11-30 MED ORDER — METHYLERGONOVINE MALEATE 0.2 MG/ML IJ SOLN: 0.2000 mg | INTRAMUSCULAR | Status: DC | PRN

## 2023-11-30 MED ORDER — EPHEDRINE 5 MG/ML INJ: 10.0000 mg | INTRAVENOUS | Status: DC | PRN

## 2023-11-30 MED ORDER — OXYCODONE HCL 5 MG PO TABS: 5.0000 mg | ORAL_TABLET | ORAL | Status: DC | PRN

## 2023-11-30 MED ORDER — FENTANYL-BUPIVACAINE-NACL 0.5-0.125-0.9 MG/250ML-% EP SOLN
12.0000 mL/h | EPIDURAL | Status: DC | PRN
  Administered 2023-11-30: 12 mL/h via EPIDURAL
  Filled 2023-11-30: qty 250

## 2023-11-30 MED ORDER — ONDANSETRON HCL 4 MG PO TABS: 4.0000 mg | ORAL_TABLET | ORAL | Status: DC | PRN

## 2023-11-30 MED ORDER — DIBUCAINE (PERIANAL) 1 % EX OINT: 1.0000 | TOPICAL_OINTMENT | CUTANEOUS | Status: DC | PRN

## 2023-11-30 MED ORDER — PRENATAL MULTIVITAMIN CH
1.0000 | ORAL_TABLET | Freq: Every day | ORAL | Status: DC
  Administered 2023-11-30 – 2023-12-01 (×2): 1 via ORAL
  Filled 2023-11-30 (×3): qty 1

## 2023-11-30 MED ORDER — MEASLES, MUMPS & RUBELLA VAC ~~LOC~~ SUSR: 0.5000 mL | Freq: Once | SUBCUTANEOUS | Status: DC

## 2023-11-30 MED ORDER — DIPHENHYDRAMINE HCL 25 MG PO CAPS
25.0000 mg | ORAL_CAPSULE | Freq: Four times a day (QID) | ORAL | Status: DC | PRN
  Administered 2023-11-30: 25 mg via ORAL
  Filled 2023-11-30: qty 1

## 2023-11-30 MED ORDER — DIPHENHYDRAMINE HCL 50 MG/ML IJ SOLN: 12.5000 mg | INTRAMUSCULAR | Status: DC | PRN

## 2023-11-30 MED ORDER — COCONUT OIL OIL
1.0000 | TOPICAL_OIL | Status: DC | PRN
  Administered 2023-12-01: 1 via TOPICAL

## 2023-11-30 MED ORDER — WITCH HAZEL-GLYCERIN EX PADS: 1.0000 | MEDICATED_PAD | CUTANEOUS | Status: DC | PRN

## 2023-11-30 MED ORDER — ZOLPIDEM TARTRATE 5 MG PO TABS: 5.0000 mg | ORAL_TABLET | Freq: Every evening | ORAL | Status: DC | PRN

## 2023-11-30 MED ORDER — ACETAMINOPHEN 325 MG PO TABS: 650.0000 mg | ORAL_TABLET | ORAL | Status: DC | PRN

## 2023-11-30 MED ORDER — LIDOCAINE HCL (PF) 1 % IJ SOLN
INTRAMUSCULAR | Status: DC | PRN
Start: 2023-11-30 — End: 2023-11-30
  Administered 2023-11-30 (×2): 4 mL via EPIDURAL

## 2023-11-30 MED ORDER — TETANUS-DIPHTH-ACELL PERTUSSIS 5-2-15.5 LF-MCG/0.5 IM SUSP: 0.5000 mL | Freq: Once | INTRAMUSCULAR | Status: DC

## 2023-11-30 NOTE — Anesthesia Preprocedure Evaluation (Signed)
 Anesthesia Evaluation  Patient identified by MRN, date of birth, ID band Patient awake    Reviewed: Allergy & Precautions, Patient's Chart, lab work & pertinent test results  History of Anesthesia Complications Negative for: history of anesthetic complications  Airway Mallampati: II  TM Distance: >3 FB Neck ROM: Full    Dental no notable dental hx.    Pulmonary asthma , former smoker   Pulmonary exam normal        Cardiovascular negative cardio ROS Normal cardiovascular exam     Neuro/Psych  Headaches  Anxiety Depression Bipolar Disorder      GI/Hepatic negative GI ROS, Neg liver ROS,,,  Endo/Other  negative endocrine ROS    Renal/GU negative Renal ROS     Musculoskeletal negative musculoskeletal ROS (+)    Abdominal   Peds  Hematology negative hematology ROS (+)   Anesthesia Other Findings   Reproductive/Obstetrics (+) Pregnancy                              Anesthesia Physical Anesthesia Plan  ASA: 2  Anesthesia Plan: Epidural   Post-op Pain Management:    Induction:   PONV Risk Score and Plan: Treatment may vary due to age or medical condition  Airway Management Planned: Natural Airway  Additional Equipment: Fetal Monitoring  Intra-op Plan:   Post-operative Plan:   Informed Consent: I have reviewed the patients History and Physical, chart, labs and discussed the procedure including the risks, benefits and alternatives for the proposed anesthesia with the patient or authorized representative who has indicated his/her understanding and acceptance.       Plan Discussed with:   Anesthesia Plan Comments:         Anesthesia Quick Evaluation

## 2023-11-30 NOTE — Anesthesia Postprocedure Evaluation (Signed)
 Anesthesia Post Note  Patient: Chief Financial Officer  Procedure(s) Performed: AN AD HOC LABOR EPIDURAL     Patient location during evaluation: Mother Baby Anesthesia Type: Epidural Level of consciousness: awake, oriented and awake and alert Pain management: pain level controlled Vital Signs Assessment: post-procedure vital signs reviewed and stable Respiratory status: spontaneous breathing, respiratory function stable and nonlabored ventilation Cardiovascular status: stable Postop Assessment: no headache, adequate PO intake, able to ambulate, patient able to bend at knees and no apparent nausea or vomiting Anesthetic complications: no   No notable events documented.  Last Vitals:  Vitals:   11/30/23 1014 11/30/23 1352  BP: 132/82 135/82  Pulse: 100 99  Resp: 18 18  Temp: 37.5 C 37 C  SpO2: 99% 100%    Last Pain:  Vitals:   11/30/23 1617  TempSrc:   PainSc: 5    Pain Goal:                   Tanyah Debruyne

## 2023-11-30 NOTE — Discharge Summary (Signed)
 Postpartum Discharge Summary  Date of Service updated***     Patient Name: [name] DOB: 27-Apr-2000 MRN: [id number]  Date of admission: 11/29/2023 Delivery date:11/30/2023 Delivering provider: MILLY PLANAS A Date of discharge: 11/30/2023  Admitting diagnosis: Indication for care or intervention in labor or delivery [O75.9] Intrauterine pregnancy: [redacted]w[redacted]d     Secondary diagnosis:  Principal Problem:   Indication for care or intervention in labor or delivery  Additional problems: ***    Discharge diagnosis: Term Pregnancy Delivered                                              Post partum procedures:{Postpartum procedures:23558} Augmentation: AROM, Cytotec, and IP Foley Complications: None  Hospital course: Induction of Labor With Vaginal Delivery   23 y.o. yo G3P1011 at [redacted]w[redacted]d was admitted to the hospital 11/29/2023 for induction of labor.  Indication for induction: nonreactive NST.  Patient had an uncomplicated labor course. Membrane Rupture Time/Date: 11:46 PM,11/29/2023  Delivery Method:Vaginal, Spontaneous Operative Delivery:N/A Episiotomy: None Lacerations:  None Details of delivery can be found in separate delivery note.  Patient had a postpartum course complicated by***. Patient is discharged home 11/30/23.  Newborn Data: Birth date:11/30/2023 Birth time:6:38 AM Gender:Female Living status:Living Apgars:7 ,9  Weight:   Magnesium  Sulfate received: No BMZ received: No Rhophylac:No MMR:No T-DaP:declined Flu: No RSV Vaccine received: No Transfusion:No  Immunizations received: Immunization History  Administered Date(s) Administered   Influenza,inj,Quad PF,6+ Mos 11/11/2015   PFIZER Comirnaty (Gray Top)Covid-19 Tri-Sucrose Vaccine 03/12/2020   Tdap 04/01/2016    Physical exam  Vitals:   11/30/23 0600 11/30/23 0630 11/30/23 0700 11/30/23 0715  BP: 135/85 (!) 109/95 130/79 134/80  Pulse: 98 (!) 163 97 100  Resp:      Temp:      TempSrc:      SpO2:       Weight:      Height:       General: {Exam; general:21111117} Lochia: {Desc; appropriate/inappropriate:30686::appropriate} Uterine Fundus: {Desc; firm/soft:30687} Incision: {Exam; incision:21111123} DVT Evaluation: {Exam; dvt:2111122} Labs: Lab Results  Component Value Date   WBC 8.5 11/29/2023   HGB 12.8 11/29/2023   HCT 36.7 11/29/2023   MCV 93.4 11/29/2023   PLT 219 11/29/2023      Latest Ref Rng & Units 11/29/2023    6:18 PM  CMP  Glucose 70 - 99 mg/dL 866   BUN 6 - 20 mg/dL 9   Creatinine 9.55 - 8.99 mg/dL 9.37   Sodium 864 - 854 mmol/L 134   Potassium 3.5 - 5.1 mmol/L 4.0   Chloride 98 - 111 mmol/L 106   CO2 22 - 32 mmol/L 17   Calcium 8.9 - 10.3 mg/dL 8.7   Total Protein 6.5 - 8.1 g/dL 6.4   Total Bilirubin 0.0 - 1.2 mg/dL 0.4   Alkaline Phos 38 - 126 U/L 148   AST 15 - 41 U/L 24   ALT 0 - 44 U/L 12    Edinburgh Score:    09/22/2016   10:53 AM  Edinburgh Postnatal Depression Scale Screening Tool  I have been able to laugh and see the funny side of things. 1   I have looked forward with enjoyment to things. 2   I have blamed myself unnecessarily when things went wrong. 2   I have been anxious or worried for no good reason. 3  I have felt scared or panicky for no good reason. 3   Things have been getting on top of me. 2   I have been so unhappy that I have had difficulty sleeping. 2   I have felt sad or miserable. 2   I have been so unhappy that I have been crying. 2   The thought of harming myself has occurred to me. 1   Edinburgh Postnatal Depression Scale Total 20      Data saved with a previous flowsheet row definition   No data recorded  After visit meds:  Allergies as of 11/30/2023       Reactions   Tape Other (See Comments)   Slight skin irritation with surgical tape. Some reaction to paper tape.     Med Rec must be completed prior to using this [facility]***        Discharge home in stable condition Infant Feeding: {Baby  feeding:23562} Infant Disposition:{CHL IP OB HOME WITH FNUYZM:76418} Discharge instruction: per After Visit Summary and Postpartum booklet. Activity: Advance as tolerated. Pelvic rest for 6 weeks.  Diet: {OB ipzu:78888878} Future Appointments: Future Appointments  Date Time Provider Department Center  01/04/2024  2:30 PM Delores Nidia CROME, FNP [facility] None   Follow up Visit:  Message sent to [facility]: Please schedule this patient for a In person postpartum visit in 6 weeks with the following provider: Olam Boards, CNM. Additional Postpartum F/U:n/a  High risk pregnancy complicated by: fetal kidney anomaly Delivery mode:  Vaginal, Spontaneous Anticipated Birth Control:  IUD OP   11/30/2023 Olam Boards, CNM

## 2023-11-30 NOTE — Lactation Note (Signed)
 This note was copied from a baby's chart. Lactation Consultation Note  Patient Name: [name] Unijb'd Date: 11/30/2023 Age:25 hours Reason for consult: L&D Initial assessment;Term  P2, RN requesting nipple shields due to difficulty latching baby.  LC assisted and with nipple stimulation, mother's nipples everted and baby was able to latch on both breasts. Lactation to follow up on MBU.   Maternal Data Does the patient have breastfeeding experience prior to this delivery?: Yes How long did the patient breastfeed?: 4 mos  Feeding    LATCH Score Latch: Grasps breast easily, tongue down, lips flanged, rhythmical sucking.  Audible Swallowing: A few with stimulation  Type of Nipple: Everted at rest and after stimulation  Comfort (Breast/Nipple): Soft / non-tender  Hold (Positioning): Assistance needed to correctly position infant at breast and maintain latch.  LATCH Score: 8   Lactation Tools Discussed/Used    Interventions Interventions: Assisted with latch;Skin to skin;Education  Discharge    Consult Status Consult Status: Follow-up from L&D    Shannon Dines [facility] 11/30/2023, 7:43 AM

## 2023-11-30 NOTE — Lactation Note (Signed)
 This note was copied from a baby's chart. Lactation Consultation Note  Patient Name: [name] Unijb'd Date: 11/30/2023 Age:25 hours Reason for consult: Follow-up assessment;Term;Breastfeeding assistance (hx polysubstance abuse)  P2- MOB requested latching assistance because she was unaware of how to place the nipple shield. LC reviewed and demonstrated how to properly place the shield. Infant was placed on the right breast in the cross cradle hold per MOB request. Infant was able to latch with the first attempt, but during the entire feeding infant was making a clicking noise. LC asked MOB if this has been happening with every feeding and MOB confirmed that it has. Repositioning infant did not make the sound stop. Infant was still feeding when LC left the room. LC set up MOB's manual pump and reviewed how to use it with her. LC encouraged MOB to call for further assistance as needed.  Maternal Data Has patient been taught Hand Expression?: No Does the patient have breastfeeding experience prior to this delivery?: Yes  Feeding Mother's Current Feeding Choice: Breast Milk and Formula Nipple Type: Slow - flow  LATCH Score Latch: Repeated attempts needed to sustain latch, nipple held in mouth throughout feeding, stimulation needed to elicit sucking reflex.  Audible Swallowing: None  Type of Nipple: Everted at rest and after stimulation  Comfort (Breast/Nipple): Soft / non-tender  Hold (Positioning): Full assist, staff holds infant at breast  LATCH Score: 5   Lactation Tools Discussed/Used Tools: Pump;Flanges Flange Size: 24 Breast pump type: Manual Pump Education: Setup, frequency, and cleaning;Milk Storage Reason for Pumping: Nipple shield use Pumping frequency: 15-20 min every 3 hrs  Interventions Interventions: Breast feeding basics reviewed;Assisted with latch;Breast compression;Adjust position;Support pillows;Position options;Hand pump;Education;LC Services  brochure  Discharge Discharge Education: Engorgement and breast care;Warning signs for feeding baby Pump: DEBP;Manual;Personal  Consult Status Consult Status: Follow-up Date: 12/01/23 Follow-up type: In-patient    Recardo Hoit BS, IBCLC 11/30/2023, 9:00 PM

## 2023-11-30 NOTE — Anesthesia Procedure Notes (Signed)
 Epidural Patient location during procedure: OB Start time: 11/30/2023 1:35 AM End time: 11/30/2023 1:38 AM  Staffing Anesthesiologist: Paul Lamarr BRAVO, MD Performed: anesthesiologist   Preanesthetic Checklist Completed: patient identified, IV checked, risks and benefits discussed, monitors and equipment checked, pre-op evaluation and timeout performed  Epidural Patient position: sitting Prep: DuraPrep and site prepped and draped Patient monitoring: continuous pulse ox, blood pressure and heart rate Approach: midline Location: L3-L4 Injection technique: LOR air  Needle:  Needle type: Tuohy  Needle gauge: 17 G Needle length: 9 cm Needle insertion depth: 6 cm Catheter type: closed end flexible Catheter size: 19 Gauge Catheter at skin depth: 11 cm Test dose: negative and Other (1% lidocaine )  Assessment Events: blood not aspirated, no cerebrospinal fluid, injection not painful, no injection resistance, no paresthesia and negative IV test  Additional Notes Patient identified. Risks, benefits, and alternatives discussed with patient including but not limited to bleeding, infection, nerve damage, paralysis, failed block, incomplete pain control, headache, blood pressure changes, nausea, vomiting, reactions to medication, itching, and postpartum back pain. Confirmed with bedside nurse the patient's most recent platelet count. Confirmed with patient that they are not currently taking any anticoagulation, have any bleeding history, or any family history of bleeding disorders. Patient expressed understanding and wished to proceed. All questions were answered. Sterile technique was used throughout the entire procedure. Please see nursing notes for vital signs.   Crisp LOR on first pass. Test dose was given through epidural catheter and negative prior to continuing to dose epidural or start infusion. Warning signs of high block given to the patient including shortness of breath,  tingling/numbness in hands, complete motor block, or any concerning symptoms with instructions to call for help. Patient was given instructions on fall risk and not to get out of bed. All questions and concerns addressed with instructions to call with any issues or inadequate analgesia.  Reason for block:procedure for pain

## 2023-12-01 MED ORDER — LIDOCAINE 1% INJECTION FOR CIRCUMCISION: 0.8000 mL | INJECTION | Freq: Once | INTRAVENOUS | Status: DC

## 2023-12-01 MED ORDER — FUROSEMIDE 20 MG PO TABS: 20.0000 mg | ORAL_TABLET | Freq: Every day | ORAL | 0 refills | Status: AC

## 2023-12-01 MED ORDER — FUROSEMIDE 20 MG PO TABS
20.0000 mg | ORAL_TABLET | Freq: Every day | ORAL | Status: DC
  Administered 2023-12-01: 20 mg via ORAL
  Filled 2023-12-01: qty 1

## 2023-12-01 MED ORDER — POTASSIUM CHLORIDE CRYS ER 20 MEQ PO TBCR
20.0000 meq | EXTENDED_RELEASE_TABLET | Freq: Every day | ORAL | Status: DC
  Administered 2023-12-01: 20 meq via ORAL
  Filled 2023-12-01: qty 1

## 2023-12-01 MED ORDER — POTASSIUM CHLORIDE CRYS ER 20 MEQ PO TBCR: 20.0000 meq | EXTENDED_RELEASE_TABLET | Freq: Every day | ORAL | 0 refills | Status: AC

## 2023-12-01 MED ORDER — SUCROSE 24% NICU/PEDS ORAL SOLUTION: 0.5000 mL | OROMUCOSAL | Status: DC | PRN

## 2023-12-01 MED ORDER — IBUPROFEN 600 MG PO TABS: 600.0000 mg | ORAL_TABLET | Freq: Four times a day (QID) | ORAL | 0 refills | Status: AC

## 2023-12-01 MED ORDER — ACETAMINOPHEN 325 MG PO TABS: 650.0000 mg | ORAL_TABLET | ORAL | 0 refills | Status: AC | PRN

## 2023-12-01 MED ORDER — EPINEPHRINE TOPICAL FOR CIRCUMCISION 0.1 MG/ML: 1.0000 [drp] | TOPICAL | Status: DC | PRN

## 2023-12-01 MED ORDER — WHITE PETROLATUM EX OINT: 1.0000 | TOPICAL_OINTMENT | CUTANEOUS | Status: DC | PRN

## 2023-12-01 MED ORDER — VASELINE PETROLATUM GAUZE EX PADS: 1.0000 | MEDICATED_PAD | Freq: Once | CUTANEOUS | Status: DC | PRN

## 2023-12-01 NOTE — Progress Notes (Signed)
 Attending Circumcision Counseling Progress Note  Patient desires circumcision for her female infant.  Circumcision procedure details discussed, risks and benefits of procedure were also discussed.  These include but are not limited to: Benefits of circumcision in men include reduction in the rates of urinary tract infection (UTI), penile cancer, some sexually transmitted infections, penile inflammatory and retractile disorders, as well as easier hygiene.  Risks include bleeding , infection, injury of glans which may lead to penile deformity or urinary tract issues, unsatisfactory cosmetic appearance and other potential complications related to the procedure.  It was emphasized that this is an elective procedure.  Patient wants to proceed with circumcision; written informed consent obtained.  Will do circumcision soon, routine circumcision and post circumcision care ordered for the infant.  Kieth JAYSON Carolin, MD 12/01/2023 9:24 AM

## 2023-12-01 NOTE — Progress Notes (Signed)
 Post Partum Day 1 s/p SVD Subjective: no complaints, up ad lib, voiding, and tolerating PO  Objective: Blood pressure (!) 140/83, pulse 98, temperature 98.7 F (37.1 C), temperature source Oral, resp. rate 17, height 5' 9 (1.753 m), weight 108 kg, last menstrual period 03/12/2023, SpO2 100%, unknown if currently breastfeeding.  Physical Exam:  General: alert, cooperative, and no distress Lochia: appropriate Uterine Fundus: firm DVT Evaluation: No evidence of DVT seen on physical exam.  Recent Labs    11/29/23 1818  HGB 12.8  HCT 36.7    Assessment/Plan: Postpartum Recovering appropriately - Contraception: Interval IUD - MOF: Breast - Rh status: Rh+ - Rubella status: Immune - Dispo: anticipate discharge home PPD2  2. History of GAD, PTSD, bipolar disorder, borderline personality d/o, suicide attempt - Denies any mood concerns this morning - SW consulted - Message routed to [facility]) to coordinate follow up within 1-2 weeks - Outpatient psychiatry referral ordered - [facility] information added to AVS  3. Elevated blood pressure - MR BP x 1 today, remainder 130s/80s - start lasix/K x 5d PP - if persistent elevation in BP, plan to add procardia   4. Neonatal - Doing well at bedside - Circumcision: desired, consent signed (see separate consent note)   LOS: 2 days   Kieth JAYSON Carolin 12/01/2023, 1:40 PM
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
## 2023-12-01 NOTE — Lactation Note (Signed)
 This note was copied from a baby's chart. Lactation Consultation Note  Patient Name: [name] Unijb'd Date: 12/01/2023 Age:25 hours   LC attempted to consult with MOB, but she had someone consulting with her at this time. [facility] team will attempt at a later time.   Recardo Hoit BS, IBCLC 12/01/2023, 10:41 AM

## 2023-12-01 NOTE — Social Work (Addendum)
 CSW escorted [facility] CPS social worker Moody Candy) to MOB's room [phone] to complete an assessment. RN informed CPS social worker at bedside.   Nat Quiet, MSW, LCSW Clinical Social Worker  [phone] 12/01/2023  4:40 PM

## 2023-12-01 NOTE — Patient Instructions (Signed)

## 2023-12-01 NOTE — Discharge Instructions (Addendum)
 [facility]   [address] [place], [place] [address] [phone] or [phone]   [facility] 24/7 FOR ANYONE  [address], [place], [place]  [phone]  Fax: [phone] [email or web address]  *Interpreters available  *Accepts all insurance and uninsured for Urgent Care needs  *Accepts Medicaid and uninsured for outpatient treatment (below)   Outpatient New Patient Assessment/Therapy Walk-ins:        Monday -Thursday 8am until slots are full.        Every Friday 1pm-4pm  (first come, first served)                    New Patient Psychiatry/Medication Management         Monday-Friday 8am-11am (first come, first served)              For all walk-ins we ask that you arrive by 7:15am, because patients will be seen in the order of arrival.

## 2023-12-05 ENCOUNTER — Ambulatory Visit (INDEPENDENT_AMBULATORY_CARE_PROVIDER_SITE_OTHER): Payer: MEDICAID | Admitting: Licensed Clinical Social Worker

## 2023-12-05 DIAGNOSIS — F314 Bipolar disorder, current episode depressed, severe, without psychotic features: Secondary | ICD-10-CM | POA: Diagnosis not present

## 2023-12-05 DIAGNOSIS — F411 Generalized anxiety disorder: Secondary | ICD-10-CM

## 2023-12-05 NOTE — BH Specialist Note (Unsigned)
 Integrated Behavioral Health via Telemedicine Visit  12/07/2023 Legend [name] [id number]  Number of Integrated Behavioral Health Clinician visits: 2- Second Visit  Session Start time: 0915   Session End time: 0952  Total time in minutes: 37    Referring Provider: Dr. Alger Patient/Family location: At home [facility] Provider location: Remote Office All persons participating in visit: Patient and [facility] Types of Service: Individual psychotherapy and Video visit  I connected with [name] and/or [name]'s patient via  Telephone or Engineer, Civil (consulting)  (Video is Surveyor, mining) and verified that I am speaking with the correct person using two identifiers. Discussed confidentiality: Yes   I discussed the limitations of telemedicine and the availability of in person appointments.  Discussed there is a possibility of technology failure and discussed alternative modes of communication if that failure occurs.  I discussed that engaging in this telemedicine visit, they consent to the provision of behavioral healthcare and the services will be billed under their insurance.  Patient and/or legal guardian expressed understanding and consented to Telemedicine visit: Yes   Presenting Concerns: Patient and/or family reports the following symptoms/concerns: Depressive symptoms and CPS involvement.  Duration of problem: Months; Severity of problem: moderate  Patient and/or Family's Strengths/Protective Factors: Social and Emotional competence, Concrete supports in place (healthy food, safe environments, etc.), Physical Health (exercise, healthy diet, medication compliance, etc.), and Caregiver has knowledge of parenting & child development  Goals Addressed: Patient will:  Reduce symptoms of: anxiety and depression   Increase knowledge and/or ability of: coping skills, healthy habits, and self-management skills   Demonstrate ability to: Increase  healthy adjustment to current life circumstances and Increase adequate support systems for patient/family  Progress towards Goals: Ongoing    Interventions: Interventions utilized:  Mindfulness or Relaxation Training, Supportive Counseling, Psychoeducation and/or Health Education, and Supportive Reflection Standardized Assessments completed: Edinburgh Postnatal Depression   Edinburgh Postnatal Depression Scale - 12/05/23 0936       Edinburgh Postnatal Depression Scale:  In the Past 7 Days   I have been able to laugh and see the funny side of things. 0    I have looked forward with enjoyment to things. 1    I have blamed myself unnecessarily when things went wrong. 2    I have been anxious or worried for no good reason. 3    I have felt scared or panicky for no good reason. 2    Things have been getting on top of me. 1    I have been so unhappy that I have had difficulty sleeping. 0    I have felt sad or miserable. 0    I have been so unhappy that I have been crying. 1    The thought of harming myself has occurred to me. 0    Edinburgh Postnatal Depression Scale Total 10           Patient and/or Family Response: Patient was present for today's virtual session. She reported a history of substance use but has maintained sobriety since November 2024. Patient has an extensive mental health history, including multiple behavioral health hospitalizations and past diagnoses of bipolar disorder, depression, anxiety, and PTSD. She is not currently taking psychotropic medications but has a scheduled medication management appointment at the behavioral health outpatient clinic on 12/07/23. Patient shared that Child Protective Services (CPS) is involved and has required her to engage in mental health treatment and resume medication management. She expressed feeling pressured to attend today's  session and reported limited motivation to participate. Patient identified that she is currently engaged with  [facility], where she participates in substance abuse groups, peer support, and plans to begin individual therapy. Patient demonstrated understanding of the Edinburgh scale and her results, and she agreed to continue behavioral health sessions until therapy begins with [facility].  Clinical Assessment/Diagnosis  Severe bipolar I disorder, most recent episode depressed (HCC)  Generalized anxiety disorder    Assessment: Patient currently experiencing experiencing external pressure to engage in treatment, limited motivation for participation, and ongoing adjustment to recovery and mental health management expectations set by CPS..   Patient may benefit from continued support of integrated behavioral health services.  Plan: Follow up with behavioral health clinician on : 11/1723 Behavioral recommendations: patient to continue participating in behavioral health sessions to support treatment compliance and stabilization, maintain engagement with [facility] programs, and follow through with the scheduled medication management appointment. Continued encouragement toward active participation and exploring internal motivation for treatment are also advised. Referral(s): Integrated [employer] (In Clinic)  I discussed the assessment and treatment plan with the patient and/or parent/guardian. They were provided an opportunity to ask questions and all were answered. They agreed with the plan and demonstrated an understanding of the instructions.   They were advised to call back or seek an in-person evaluation if the symptoms worsen or if the condition fails to improve as anticipated.  [name], LCSWA

## 2023-12-06 ENCOUNTER — Ambulatory Visit: Payer: MEDICAID

## 2023-12-07 ENCOUNTER — Telehealth (INDEPENDENT_AMBULATORY_CARE_PROVIDER_SITE_OTHER): Payer: MEDICAID | Admitting: Psychiatry

## 2023-12-07 ENCOUNTER — Encounter (HOSPITAL_COMMUNITY): Payer: Self-pay | Admitting: Psychiatry

## 2023-12-07 DIAGNOSIS — F411 Generalized anxiety disorder: Secondary | ICD-10-CM

## 2023-12-07 MED ORDER — SERTRALINE HCL 50 MG PO TABS
50.0000 mg | ORAL_TABLET | Freq: Every day | ORAL | 3 refills | Status: AC
Start: 2023-12-07 — End: ?

## 2023-12-07 NOTE — Progress Notes (Signed)
 [facility] MD/PA/NP OP Progress Note Virtual Visit via Video Note  I connected with [name] on 12/07/23 at  9:30 AM EST by a video enabled telemedicine application and verified that I am speaking with the correct person using two identifiers.  Location: Patient: Home Provider: Clinic   I discussed the limitations of evaluation and management by telemedicine and the availability of in person appointments. The patient expressed understanding and agreed to proceed.  I provided 30 minutes of non-face-to-face time during this encounter.   12/07/2023 10:01 AM [name]  MRN:  [id number]  Chief Complaint: I have been really anxious  HPI: 23 year old female seen today for follow up psychiatric evaluation.   She has a psychiatric history of PTSD, GAD, polysubstance (alcohol, cannabis methamphetamines, stimulants, and cocaine), bipolar affective disorder, borderline personality disorder, SI, panic and insomnia.  She is currently managed on Zoloft  50 mg daily, Abilify  7.5 mg, Unisom  25 mg nightly as needed, and Strattera  60 mg daily. Patient  notes that she discontinued all of her medications.  She delivered her son on November 30, 2023 and now notes that she wants to restart some of her medications.  Today she is well groomed, pleasant, cooperative, and engaged in conversation. Patient informed clinical research associate that she has been increasingly anxious and depressed.  She reports that she worries about her newborn son (not breathing or falling), finding an apartment of her own, getting her 69-year-old son back, and support from her child's father.  Patient is currently living with her aunt.  She notes that she has a support of her aunt and her mother.  She notes that the father of her child has been ignoring her since asking him to have a paternity test.  Patient also reports that she is worried about CPS.  She notes that CPS is mandating that she seek mental health treatment.  She inform her that she is  hopeful that the case will close soon.  Patient informed clinical research associate that she no longer uses illegal substances.  She does note that she smokes tobacco twice.  Patient also reports that her 70-year-old son is in the care of his paternal grandparents and she wants him to be with her full-time.   Patient informed writer that the above exacerbates her anxiety and depression.  Today provider conducted a GAD-7 of a score of 17.  Provider also conducted PHQ-9 the patient scored a 16.  She endorses hypersomnia.  Today she denies SI/HI/AVH, mania, paranoia.  She does note at times she has racing thoughts and mild irritability.   Patient reports at times she feels that her concentration is poor.  She notes that she zones out.  She finds that she is forgetful and disorganized.  Provider discussed the risk and benefit from restarting Strattera  while breastfeeding.  At this time she notes that she does not wish to restart it.  Patient notes that she wants to restart medications to comply with CPS.  At this time she only wants to start Zoloft  50 mg to help manage anxiety and depression.  Patient notes that she is breast-feeding and will consider taking other medications when she is no longer breast-feeding. She will follow up with outpatient counseling for therapy.  No other concerns at this time.  Visit Diagnosis:  No diagnosis found.    Past Psychiatric History: PTSD, GAD, polysubstance (alcohol, cannabis methamphetamines, stimulants, and cocaine), bipolar affective disorder, borderline personality disorder, SI, panic and insomnia.   Past Medical History:  Past Medical History:  Diagnosis Date   Anxiety    ASCUS with positive high risk HPV cervical 03/13/2020   Asthma    Asthma due to seasonal allergies 03/13/2020   Gonorrhea 04/21/2022   Headache(784.0)    Hx of suicide attempt    Irritable bowel syndrome 03/25/2020   Major depressive disorder    Morbid obesity (HCC) 03/25/2020   PTSD (post-traumatic  stress disorder)    Seasonal allergies 03/13/2020   Shoulder dystocia during labor and delivery 06/16/2016   45s, relieved by delivery of posterior arm      Past Surgical History:  Procedure Laterality Date   wisdom tooth extraction      Family Psychiatric History: Maternal grandmother bipolar, mother alcohol use, father marijuana use, son ADHD, maternal aunt bipolar disorder, substance use on maternal and paternal side   Family History:  Family History  Problem Relation Age of Onset   Diabetes Maternal Aunt    Diabetes Maternal Grandmother    Cancer Maternal Grandmother    Breast cancer Maternal Grandmother    Breast cancer Other     Social History:  Social History   Socioeconomic History   Marital status: Single    Spouse name: Not on file   Number of children: 2   Years of education: Not on file   Highest education level: Some college, no degree  Occupational History   Not on file  Tobacco Use   Smoking status: Former    Current packs/day: 0.50    Average packs/day: 0.5 packs/day for 4.0 years (2.0 ttl pk-yrs)    Types: Cigarettes, Cigars    Passive exposure: Past   Smokeless tobacco: Never   Tobacco comments:    black and mild with THC  Vaping Use   Vaping status: Never Used  Substance and Sexual Activity   Alcohol use: Not Currently   Drug use: Not Currently    Types: Marijuana    Comment: Coc-last use 2/27; marijuana and MDMA last use 10/28   Sexual activity: Yes    Partners: Male    Birth control/protection: None  Other Topics Concern   Not on file  Social History Narrative   Pt and family are homeless. She, her wife and two kids live in her cousin's house         04/15/22 pt reported to this clinical research associate that she lives with her grandpa and her dad, and her grandmother recently died.   Social Drivers of Corporate Investment Banker Strain: Not on file  Food Insecurity: Food Insecurity Present (11/29/2023)   Hunger Vital Sign    Worried About Running Out  of Food in the Last Year: Sometimes true    Ran Out of Food in the Last Year: Sometimes true  Transportation Needs: Unmet Transportation Needs (11/29/2023)   PRAPARE - Administrator, Civil Service (Medical): Yes    Lack of Transportation (Non-Medical): Yes  Physical Activity: Not on file  Stress: Not on file  Social Connections: Unknown (12/21/2021)   Received from [facility]   Social Connections    Frequency of Communication with Friends and Family: Not asked    Frequency of Social Gatherings with Friends and Family: Not asked    Allergies:  Allergies  Allergen Reactions   Tape Other (See Comments)    Slight skin irritation with surgical tape. Some reaction to paper tape.    Metabolic Disorder Labs: Lab Results  Component Value Date   HGBA1C 5.2 05/30/2023   MPG 91.06 03/16/2023  MPG 102.54 08/15/2022   Lab Results  Component Value Date   PROLACTIN 6.6 12/28/2018   PROLACTIN 5.8 08/23/2017   Lab Results  Component Value Date   CHOL 164 03/16/2023   TRIG 40 03/16/2023   HDL 56 03/16/2023   CHOLHDL 2.9 03/16/2023   VLDL 8 03/16/2023   LDLCALC 100 (H) 03/16/2023   LDLCALC 101 (H) 08/15/2022   Lab Results  Component Value Date   TSH 0.476 03/16/2023   TSH 0.391 08/15/2022    Therapeutic Level Labs: No results found for: LITHIUM No results found for: VALPROATE No results found for: CBMZ  Current Medications: Current Outpatient Medications  Medication Sig Dispense Refill   sertraline  (ZOLOFT ) 50 MG tablet Take 1 tablet (50 mg total) by mouth daily. 30 tablet 3   acetaminophen  (TYLENOL ) 325 MG tablet Take 2 tablets (650 mg total) by mouth every 4 (four) hours as needed for up to 30 doses (for pain scale < 4). 30 tablet 0   furosemide (LASIX) 20 MG tablet Take 1 tablet (20 mg total) by mouth daily. 4 tablet 0   ibuprofen  (ADVIL ) 600 MG tablet Take 1 tablet (600 mg total) by mouth every 6 (six) hours. 30 tablet 0   potassium chloride  SA  (KLOR-CON  M) 20 MEQ tablet Take 1 tablet (20 mEq total) by mouth daily. 4 tablet 0   Prenatal Vit-Fe Phos-FA-Omega (VITAFOL  GUMMIES) 3.33-0.333-34.8 MG CHEW Chew 1 tablet by mouth daily. 90 tablet 5   No current facility-administered medications for this visit.     Musculoskeletal: Strength & Muscle Tone: within normal limits and Telehealth visit Gait & Station: normal, Telehealth visit Patient leans: N/A  Psychiatric Specialty Exam: Review of Systems  Last menstrual period 03/12/2023, unknown if currently breastfeeding.There is no height or weight on file to calculate BMI.  General Appearance: Well Groomed  Eye Contact:  Good  Speech:  Clear and Coherent and Normal Rate  Volume:  Normal  Mood:  Anxious and Depressed  Affect:  Appropriate and Congruent  Thought Process:  Coherent, Goal Directed, and Linear  Orientation:  Full (Time, Place, and Person)  Thought Content: WDL and Logical   Suicidal Thoughts:  No  Homicidal Thoughts:  No  Memory:  Immediate;   Good Recent;   Good Remote;   Good  Judgement:  Good  Insight:  Good  Psychomotor Activity:  Normal  Concentration:  Concentration: Fair and Attention Span: Fair  Recall:  [employer] of Knowledge: Good  Language: Good  Akathisia:  No  Handed:  Right  AIMS (if indicated): not done  Assets:  Communication Skills Desire for Improvement Housing Intimacy Physical Health Social Support  ADL's:  Intact  Cognition: WNL  Sleep:  Fair   Screenings: AIMS    Flowsheet Row [facility] (Dis[facility] (Discharged) from [facility] Ad[facility] (Discharged) from [facility]) [facility]  AIMS Total Score 0 0 0 0   AUDIT    Flowsheet Row Ad[facility]  [facility]) [facility] (Discharged[facility] Ad[facility] (Discharged) [facility] Ad[facility] (Discharged) [facility] ([facility]) [facility]  Alcohol Use Disorder Identification Test Final Score (AUDIT) 2 2 0 0 0   GAD-7    Flowsheet Row Video Visit from 12/07/2023 in [facility] Routine Prenatal from 10/31/2023 in [facility] [place] Initial Prenatal from 05/30/2023 in [facility] for [facility] [place] Clinical Support from 05/16/2023 in [facility] for [facility] [place] Video Visit from 02/15/2023 in [facility]  Total GAD-7 Score 17 7 19 18 21    PHQ2-9    Flowsheet Row Video Visit from 12/07/2023 in [facility] Routine Prenatal from 10/31/2023 in [facility] for [facility] [place] Initial Prenatal from 05/30/2023 in [facility] [place] Clinical Support from 05/16/2023 in [facility] [place] Video Visit from 02/15/2023 in [place]  PHQ-2 Total Score 3 2 6 4 4   PHQ-9 Total Score 16 9 20 16 22    Flowsheet Row Admission (Discharged) from 11/29/2023 in [place] 5S Mother Baby Unit Admission (Discharged) from 11/18/2023 in [place] 1S Maternity Assessment Unit ED from 08/24/2023 in [facility] Emergency Department at [facility]  C-SSRS RISK CATEGORY No Risk No Risk No Risk     Assessment and Plan: Patient endorses increased anxiety, depression, hypersomnia and poor concentration. Patient notes that she wants to restart medications to comply with CPS.  At this time she  only wants to start Zoloft  50 mg to help manage anxiety and depression.  Patient notes that she is breast-feeding and will consider taking other medications when she is no longer breast-feeding.   1. Generalized anxiety disorder (Primary)  Restart- sertraline  (ZOLOFT ) 50 MG tablet; Take 1 tablet (50 mg total) by mouth daily.  Dispense: 30 tablet; Refill: 3    Collaboration of Care: Collaboration of Care: Other provider involved in patient's care AEB PCP  Patient/Guardian was advised Release of Information must be obtained prior to any record release in order to collaborate their care with an outside provider. Patient/Guardian was advised if they have not already done so to contact the registration department to sign all necessary forms in order for us  to release information regarding their care.   Consent: Patient/Guardian gives verbal consent for treatment and assignment of benefits for services provided during this visit. Patient/Guardian expressed understanding and agreed to proceed.   Follow up in 2.5 months Follow up with NA Follow up with threapy  Zane FORBES Bach, NP 12/07/2023, 10:01 AM

## 2023-12-12 ENCOUNTER — Encounter: Payer: MEDICAID | Admitting: Licensed Clinical Social Worker

## 2023-12-12 ENCOUNTER — Ambulatory Visit: Payer: MEDICAID

## 2023-12-15 ENCOUNTER — Telehealth (HOSPITAL_COMMUNITY): Payer: Self-pay | Admitting: *Deleted

## 2023-12-15 NOTE — Telephone Encounter (Signed)
 12/15/2023  Name: [name] MRN: [id number] DOB: [birth year]  Reason for Call:  Transition of Care Hospital Discharge Call  Contact Status: Patient Contact Status: Complete  Language assistant needed: Interpreter Mode: Interpreter Not Needed        Follow-Up Questions: Do You Have Any Concerns About Your Health As You Heal From Delivery?: No Do You Have Any Concerns About Your Infants Health?: No  Edinburgh Postnatal Depression Scale:  In the Past 7 Days:    PHQ2-9 Depression Scale:     Discharge Follow-up: Edinburgh score requires follow up?:  (Declines screening today, says she is doing OK emotionally and that she is seeing a therapist.) Patient was advised of the following resources:: Support Group, Breastfeeding Support Group  Post-discharge interventions: Reviewed Newborn Safe Sleep Practices  Mliss Sieve, RN 12/15/2023 14:21

## 2024-01-03 ENCOUNTER — Ambulatory Visit: Payer: MEDICAID | Admitting: Advanced Practice Midwife

## 2024-01-04 ENCOUNTER — Ambulatory Visit: Payer: MEDICAID | Admitting: Obstetrics and Gynecology

## 2024-01-11 ENCOUNTER — Ambulatory Visit: Payer: MEDICAID | Admitting: Obstetrics and Gynecology

## 2024-01-30 ENCOUNTER — Ambulatory Visit: Payer: MEDICAID | Admitting: Obstetrics and Gynecology

## 2024-02-15 ENCOUNTER — Telehealth (HOSPITAL_COMMUNITY): Payer: MEDICAID | Admitting: Psychiatry

## 2024-02-15 ENCOUNTER — Encounter (HOSPITAL_COMMUNITY): Payer: Self-pay
